# Patient Record
Sex: Male | Born: 1935 | Race: White | Hispanic: No | Marital: Married | Smoking: Former smoker
Health system: Southern US, Community
[De-identification: ages and names within clinical notes are randomized; demographics above are authoritative.]

## PROBLEM LIST (undated history)

## (undated) DIAGNOSIS — I4892 Unspecified atrial flutter: Secondary | ICD-10-CM

## (undated) DIAGNOSIS — E785 Hyperlipidemia, unspecified: Secondary | ICD-10-CM

## (undated) DIAGNOSIS — K219 Gastro-esophageal reflux disease without esophagitis: Secondary | ICD-10-CM

## (undated) DIAGNOSIS — E119 Type 2 diabetes mellitus without complications: Secondary | ICD-10-CM

## (undated) DIAGNOSIS — R635 Abnormal weight gain: Secondary | ICD-10-CM

## (undated) DIAGNOSIS — E039 Hypothyroidism, unspecified: Secondary | ICD-10-CM

## (undated) DIAGNOSIS — I219 Acute myocardial infarction, unspecified: Secondary | ICD-10-CM

## (undated) DIAGNOSIS — I10 Essential (primary) hypertension: Secondary | ICD-10-CM

## (undated) DIAGNOSIS — J449 Chronic obstructive pulmonary disease, unspecified: Secondary | ICD-10-CM

## (undated) DIAGNOSIS — I509 Heart failure, unspecified: Secondary | ICD-10-CM

## (undated) DIAGNOSIS — R0609 Other forms of dyspnea: Secondary | ICD-10-CM

## (undated) DIAGNOSIS — R609 Edema, unspecified: Secondary | ICD-10-CM

## (undated) DIAGNOSIS — I2510 Atherosclerotic heart disease of native coronary artery without angina pectoris: Secondary | ICD-10-CM

## (undated) DIAGNOSIS — Z95810 Presence of automatic (implantable) cardiac defibrillator: Secondary | ICD-10-CM

## (undated) DIAGNOSIS — M109 Gout, unspecified: Secondary | ICD-10-CM

## (undated) DIAGNOSIS — I6529 Occlusion and stenosis of unspecified carotid artery: Secondary | ICD-10-CM

## (undated) DIAGNOSIS — R0989 Other specified symptoms and signs involving the circulatory and respiratory systems: Secondary | ICD-10-CM

## (undated) DIAGNOSIS — F0390 Unspecified dementia without behavioral disturbance: Secondary | ICD-10-CM

## (undated) HISTORY — DX: Hyperlipidemia, unspecified: E78.5

## (undated) HISTORY — DX: Essential (primary) hypertension: I10

## (undated) HISTORY — DX: Other specified symptoms and signs involving the circulatory and respiratory systems: R06.09

## (undated) HISTORY — DX: Presence of automatic (implantable) cardiac defibrillator: Z95.810

## (undated) HISTORY — DX: Atherosclerotic heart disease of native coronary artery without angina pectoris: I25.10

## (undated) HISTORY — PX: CARDIAC DEFIBRILLATOR PLACEMENT: SHX171

## (undated) HISTORY — DX: Heart failure, unspecified: I50.9

## (undated) HISTORY — DX: Chronic obstructive pulmonary disease, unspecified: J44.9

## (undated) HISTORY — PX: ANGIOPLASTY: SHX39

## (undated) HISTORY — DX: Acute myocardial infarction, unspecified: I21.9

## (undated) HISTORY — DX: Unspecified atrial flutter: I48.92

## (undated) HISTORY — DX: Other specified symptoms and signs involving the circulatory and respiratory systems: R09.89

## (undated) HISTORY — DX: Type 2 diabetes mellitus without complications: E11.9

## (undated) HISTORY — DX: Occlusion and stenosis of unspecified carotid artery: I65.29

## (undated) HISTORY — DX: Gout, unspecified: M10.9

## (undated) HISTORY — DX: Abnormal weight gain: R63.5

## (undated) HISTORY — DX: Edema, unspecified: R60.9

## (undated) HISTORY — PX: CAROTID STENT: SHX1301

## (undated) HISTORY — DX: Hypothyroidism, unspecified: E03.9

---
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
## 1983-06-02 HISTORY — PX: EYE SURGERY: SHX253

## 1997-10-14 ENCOUNTER — Inpatient Hospital Stay (HOSPITAL_COMMUNITY): Admission: EM | Admit: 1997-10-14 | Discharge: 1997-10-17 | Payer: Self-pay | Admitting: Cardiology

## 1998-09-23 ENCOUNTER — Encounter: Payer: Self-pay | Admitting: Thoracic Surgery

## 1998-09-24 ENCOUNTER — Inpatient Hospital Stay (HOSPITAL_COMMUNITY): Admission: RE | Admit: 1998-09-24 | Discharge: 1998-09-26 | Payer: Self-pay | Admitting: Thoracic Surgery

## 1999-05-22 ENCOUNTER — Ambulatory Visit (HOSPITAL_COMMUNITY): Admission: RE | Admit: 1999-05-22 | Discharge: 1999-05-23 | Payer: Self-pay | Admitting: Cardiology

## 1999-05-23 ENCOUNTER — Encounter: Payer: Self-pay | Admitting: Cardiology

## 2000-07-07 ENCOUNTER — Encounter: Payer: Self-pay | Admitting: Internal Medicine

## 2000-07-07 ENCOUNTER — Ambulatory Visit (HOSPITAL_COMMUNITY): Admission: RE | Admit: 2000-07-07 | Discharge: 2000-07-08 | Payer: Self-pay | Admitting: Internal Medicine

## 2000-07-08 ENCOUNTER — Encounter: Payer: Self-pay | Admitting: Internal Medicine

## 2004-03-10 ENCOUNTER — Emergency Department (HOSPITAL_COMMUNITY): Admission: EM | Admit: 2004-03-10 | Discharge: 2004-03-10 | Payer: Self-pay | Admitting: Emergency Medicine

## 2004-05-05 ENCOUNTER — Ambulatory Visit: Payer: Self-pay | Admitting: Internal Medicine

## 2004-05-05 ENCOUNTER — Ambulatory Visit: Payer: Self-pay | Admitting: Cardiology

## 2004-05-14 ENCOUNTER — Inpatient Hospital Stay (HOSPITAL_COMMUNITY): Admission: EM | Admit: 2004-05-14 | Discharge: 2004-05-17 | Payer: Self-pay | Admitting: Emergency Medicine

## 2004-06-10 ENCOUNTER — Ambulatory Visit: Payer: Self-pay

## 2004-11-04 ENCOUNTER — Ambulatory Visit: Payer: Self-pay | Admitting: Cardiology

## 2005-02-10 ENCOUNTER — Ambulatory Visit: Payer: Self-pay | Admitting: Internal Medicine

## 2005-02-19 ENCOUNTER — Ambulatory Visit: Payer: Self-pay | Admitting: Cardiology

## 2005-06-05 ENCOUNTER — Ambulatory Visit: Payer: Self-pay | Admitting: Cardiology

## 2005-06-10 ENCOUNTER — Ambulatory Visit (HOSPITAL_COMMUNITY): Admission: RE | Admit: 2005-06-10 | Discharge: 2005-06-10 | Payer: Self-pay | Admitting: Surgery

## 2005-07-20 ENCOUNTER — Ambulatory Visit (HOSPITAL_COMMUNITY): Admission: RE | Admit: 2005-07-20 | Discharge: 2005-07-21 | Payer: Self-pay | Admitting: Surgery

## 2005-07-20 ENCOUNTER — Encounter (INDEPENDENT_AMBULATORY_CARE_PROVIDER_SITE_OTHER): Payer: Self-pay | Admitting: *Deleted

## 2005-09-03 ENCOUNTER — Ambulatory Visit: Payer: Self-pay | Admitting: Internal Medicine

## 2005-12-31 ENCOUNTER — Ambulatory Visit: Payer: Self-pay | Admitting: Internal Medicine

## 2006-02-17 ENCOUNTER — Ambulatory Visit: Payer: Self-pay | Admitting: Internal Medicine

## 2006-02-26 ENCOUNTER — Ambulatory Visit: Payer: Self-pay | Admitting: Internal Medicine

## 2006-03-05 ENCOUNTER — Ambulatory Visit: Payer: Self-pay | Admitting: Internal Medicine

## 2006-03-05 ENCOUNTER — Observation Stay (HOSPITAL_COMMUNITY): Admission: AD | Admit: 2006-03-05 | Discharge: 2006-03-06 | Payer: Self-pay | Admitting: Internal Medicine

## 2006-03-15 ENCOUNTER — Ambulatory Visit: Payer: Self-pay

## 2006-06-15 ENCOUNTER — Ambulatory Visit: Payer: Self-pay | Admitting: Internal Medicine

## 2006-09-16 ENCOUNTER — Ambulatory Visit: Payer: Self-pay | Admitting: Internal Medicine

## 2007-02-17 ENCOUNTER — Ambulatory Visit: Payer: Self-pay | Admitting: Internal Medicine

## 2007-05-19 ENCOUNTER — Ambulatory Visit: Payer: Self-pay | Admitting: Internal Medicine

## 2007-07-05 ENCOUNTER — Ambulatory Visit: Payer: Self-pay | Admitting: Internal Medicine

## 2007-09-29 ENCOUNTER — Ambulatory Visit: Payer: Self-pay | Admitting: Cardiology

## 2007-10-05 ENCOUNTER — Ambulatory Visit: Payer: Self-pay | Admitting: Cardiology

## 2007-10-05 ENCOUNTER — Encounter: Payer: Self-pay | Admitting: Cardiology

## 2007-10-05 ENCOUNTER — Ambulatory Visit: Payer: Self-pay

## 2007-10-05 LAB — CONVERTED CEMR LAB
Calcium: 9.4 mg/dL (ref 8.4–10.5)
Chloride: 106 meq/L (ref 96–112)
Creatinine, Ser: 1.5 mg/dL (ref 0.4–1.5)
GFR calc non Af Amer: 49 mL/min
Sodium: 142 meq/L (ref 135–145)

## 2007-10-06 ENCOUNTER — Ambulatory Visit: Payer: Self-pay | Admitting: Internal Medicine

## 2007-10-20 ENCOUNTER — Ambulatory Visit: Payer: Self-pay | Admitting: Cardiology

## 2007-10-27 ENCOUNTER — Encounter: Payer: Self-pay | Admitting: Internal Medicine

## 2007-10-27 ENCOUNTER — Ambulatory Visit (HOSPITAL_COMMUNITY): Admission: RE | Admit: 2007-10-27 | Discharge: 2007-10-27 | Payer: Self-pay | Admitting: Cardiology

## 2007-10-31 ENCOUNTER — Ambulatory Visit: Payer: Self-pay | Admitting: Cardiology

## 2007-11-17 ENCOUNTER — Ambulatory Visit: Payer: Self-pay | Admitting: Internal Medicine

## 2007-11-17 DIAGNOSIS — R0609 Other forms of dyspnea: Secondary | ICD-10-CM

## 2007-11-17 DIAGNOSIS — I5022 Chronic systolic (congestive) heart failure: Secondary | ICD-10-CM | POA: Insufficient documentation

## 2007-11-17 DIAGNOSIS — R0989 Other specified symptoms and signs involving the circulatory and respiratory systems: Secondary | ICD-10-CM | POA: Insufficient documentation

## 2007-11-17 DIAGNOSIS — I10 Essential (primary) hypertension: Secondary | ICD-10-CM

## 2007-11-17 DIAGNOSIS — I219 Acute myocardial infarction, unspecified: Secondary | ICD-10-CM | POA: Insufficient documentation

## 2007-11-17 HISTORY — DX: Essential (primary) hypertension: I10

## 2007-11-18 LAB — CONVERTED CEMR LAB
BUN: 26 mg/dL — ABNORMAL HIGH (ref 6–23)
Chloride: 101 meq/L (ref 96–112)
Eosinophils Relative: 3.5 % (ref 0.0–5.0)
Glucose, Bld: 111 mg/dL — ABNORMAL HIGH (ref 70–99)
HCT: 40.2 % (ref 39.0–52.0)
Monocytes Relative: 9.1 % (ref 3.0–12.0)
Neutrophils Relative %: 58.4 % (ref 43.0–77.0)
Platelets: 180 10*3/uL (ref 150–400)
Potassium: 5 meq/L (ref 3.5–5.1)
RBC: 4.08 M/uL — ABNORMAL LOW (ref 4.22–5.81)
WBC: 6 10*3/uL (ref 4.5–10.5)

## 2007-12-01 ENCOUNTER — Ambulatory Visit: Payer: Self-pay | Admitting: Internal Medicine

## 2007-12-01 DIAGNOSIS — J449 Chronic obstructive pulmonary disease, unspecified: Secondary | ICD-10-CM

## 2007-12-01 DIAGNOSIS — R609 Edema, unspecified: Secondary | ICD-10-CM | POA: Insufficient documentation

## 2007-12-01 DIAGNOSIS — J4489 Other specified chronic obstructive pulmonary disease: Secondary | ICD-10-CM

## 2007-12-01 HISTORY — DX: Chronic obstructive pulmonary disease, unspecified: J44.9

## 2007-12-01 HISTORY — DX: Other specified chronic obstructive pulmonary disease: J44.89

## 2007-12-22 ENCOUNTER — Ambulatory Visit (HOSPITAL_BASED_OUTPATIENT_CLINIC_OR_DEPARTMENT_OTHER): Admission: RE | Admit: 2007-12-22 | Discharge: 2007-12-22 | Payer: Self-pay | Admitting: Internal Medicine

## 2007-12-22 ENCOUNTER — Encounter: Payer: Self-pay | Admitting: Internal Medicine

## 2008-01-02 ENCOUNTER — Ambulatory Visit: Payer: Self-pay | Admitting: Pulmonary Disease

## 2008-01-05 ENCOUNTER — Ambulatory Visit: Payer: Self-pay | Admitting: Internal Medicine

## 2008-01-23 ENCOUNTER — Ambulatory Visit: Payer: Self-pay | Admitting: Internal Medicine

## 2008-01-23 DIAGNOSIS — R635 Abnormal weight gain: Secondary | ICD-10-CM | POA: Insufficient documentation

## 2008-01-31 ENCOUNTER — Ambulatory Visit: Payer: Self-pay | Admitting: Cardiology

## 2008-04-05 ENCOUNTER — Ambulatory Visit: Payer: Self-pay | Admitting: Internal Medicine

## 2008-07-31 ENCOUNTER — Ambulatory Visit: Payer: Self-pay | Admitting: Cardiology

## 2008-08-07 ENCOUNTER — Encounter: Payer: Self-pay | Admitting: Internal Medicine

## 2008-08-21 ENCOUNTER — Encounter: Payer: Self-pay | Admitting: Internal Medicine

## 2008-08-21 ENCOUNTER — Ambulatory Visit: Payer: Self-pay | Admitting: Internal Medicine

## 2008-09-28 ENCOUNTER — Ambulatory Visit: Payer: Self-pay | Admitting: Internal Medicine

## 2008-11-22 ENCOUNTER — Ambulatory Visit: Payer: Self-pay | Admitting: Internal Medicine

## 2009-01-16 ENCOUNTER — Encounter (INDEPENDENT_AMBULATORY_CARE_PROVIDER_SITE_OTHER): Payer: Self-pay | Admitting: *Deleted

## 2009-02-21 ENCOUNTER — Ambulatory Visit: Payer: Self-pay | Admitting: Internal Medicine

## 2009-04-29 ENCOUNTER — Ambulatory Visit: Payer: Self-pay | Admitting: Cardiology

## 2009-04-29 DIAGNOSIS — E039 Hypothyroidism, unspecified: Secondary | ICD-10-CM | POA: Insufficient documentation

## 2009-04-29 DIAGNOSIS — E785 Hyperlipidemia, unspecified: Secondary | ICD-10-CM | POA: Insufficient documentation

## 2009-05-17 ENCOUNTER — Ambulatory Visit (HOSPITAL_COMMUNITY): Admission: RE | Admit: 2009-05-17 | Discharge: 2009-05-17 | Payer: Self-pay | Admitting: Cardiology

## 2009-05-17 ENCOUNTER — Ambulatory Visit: Payer: Self-pay | Admitting: Cardiology

## 2009-05-17 ENCOUNTER — Ambulatory Visit: Payer: Self-pay

## 2009-05-17 ENCOUNTER — Encounter: Payer: Self-pay | Admitting: Cardiology

## 2009-05-23 ENCOUNTER — Encounter: Payer: Self-pay | Admitting: Internal Medicine

## 2009-05-23 ENCOUNTER — Ambulatory Visit: Payer: Self-pay | Admitting: Internal Medicine

## 2009-05-27 ENCOUNTER — Encounter: Payer: Self-pay | Admitting: Internal Medicine

## 2009-09-09 ENCOUNTER — Encounter: Payer: Self-pay | Admitting: Cardiology

## 2009-09-10 ENCOUNTER — Ambulatory Visit: Payer: Self-pay | Admitting: Cardiology

## 2009-10-24 ENCOUNTER — Encounter: Payer: Self-pay | Admitting: Cardiology

## 2009-12-05 ENCOUNTER — Ambulatory Visit: Payer: Self-pay | Admitting: Internal Medicine

## 2009-12-05 DIAGNOSIS — Z95810 Presence of automatic (implantable) cardiac defibrillator: Secondary | ICD-10-CM | POA: Insufficient documentation

## 2009-12-05 HISTORY — DX: Presence of automatic (implantable) cardiac defibrillator: Z95.810

## 2010-03-06 ENCOUNTER — Ambulatory Visit: Payer: Self-pay | Admitting: Internal Medicine

## 2010-03-07 ENCOUNTER — Encounter: Payer: Self-pay | Admitting: Internal Medicine

## 2010-04-16 ENCOUNTER — Ambulatory Visit: Payer: Self-pay | Admitting: Cardiology

## 2010-04-21 ENCOUNTER — Ambulatory Visit: Payer: Self-pay | Admitting: Cardiology

## 2010-05-04 LAB — CONVERTED CEMR LAB
BUN: 37 mg/dL — ABNORMAL HIGH (ref 6–23)
CO2: 33 meq/L — ABNORMAL HIGH (ref 19–32)
Chloride: 100 meq/L (ref 96–112)
Glucose, Bld: 239 mg/dL — ABNORMAL HIGH (ref 70–99)
Potassium: 4.5 meq/L (ref 3.5–5.1)

## 2010-06-12 ENCOUNTER — Ambulatory Visit
Admission: RE | Admit: 2010-06-12 | Discharge: 2010-06-12 | Payer: Self-pay | Source: Home / Self Care | Attending: Internal Medicine | Admitting: Internal Medicine

## 2010-06-13 ENCOUNTER — Encounter: Payer: Self-pay | Admitting: Internal Medicine

## 2010-07-01 NOTE — Assessment & Plan Note (Signed)
Summary: due 11/11  428.22  pfh,rn   Visit Type:  Follow-up Primary Provider:  Dr. [name]  CC:  Cardiomyopathy.  History of Present Illness: The patient presents for follow up of his cardiomyopathy. Since I last saw him he has had no new complaints but continues to have lower extremity swelling. It actually is worse than previous. He is wearing compression stockings. However, he saw his fever has his feet down most of the day. He is not having any new shortness of breath, PND or orthopnea. Is not having any new palpitations or presyncope. Is not having any chest discomfort, neck or arm discomfort. He has had no weight gain or edema.  Current Medications (verified): 1)  Spiriva Handihaler 18 Mcg  Caps (Tiotropium Bromide Monohydrate) .Marland Kitchen.. 1 Puff Once Daily 2)  Protonix 40 Mg  Tbec (Pantoprazole Sodium) .... Take  One 30-60 Min Before First Meal of The Day 3)  Furosemide 80 Mg Tabs (Furosemide) .Marland Kitchen.. 1 Two Times A Day 4)  Benicar 20 Mg  Tabs (Olmesartan Medoxomil) .... Once Daily 5)  Metoprolol Succinate 200 Mg  Tb24 (Metoprolol Succinate) .... Take 1/2 Tab By Mouth Two Times A Day 6)  Synthroid 175 Mcg Tabs (Levothyroxine Sodium) .Marland Kitchen.. 1 By Mouth Once Daily 7)  Aspirin Adult Low Strength 81 Mg  Tbec (Aspirin) .... Once Daily 8)  Lipitor 40 Mg  Tabs (Atorvastatin Calcium) .... Take 1 Tab By Mouth At Bedtime 9)  Cvs Allergy Relief 10 Mg  Tbdp (Loratadine) .... One Daily If Needed For Itchy Sneezy or Runny Nose 10)  Ventolin Hfa 108 (90 Base) Mcg/act  Aers (Albuterol Sulfate) .... 2 Puffs Every 4 Hr As Needed 11)  Multivitamins  Tabs (Multiple Vitamin) .... Take 1 Tablet By Mouth Once A Day 12)  Slow Fe 160 (50 Fe) Mg Cr-Tabs (Ferrous Sulfate Dried) .Marland Kitchen.. 1 Two Times A Day  Allergies (verified): No Known Drug Allergies  Past History:  Past Medical History: Reviewed history from 09/10/2009 and no changes required. Hypertension Myocardial Infarction 1999 (left main 30% stenosis, LAD 30%  stenosis     before first diagonal, 40 % stenosis after septal perforator, 40%     diagonal stenosis, circumflex 70% proximal stenosis, right coronary     artery large and dominant with 30% proximal stenosis, 80% narrowing     at the ostium of the PDA and 90% narrowing of a small branch of the     PDA.  The patient had tandem stenting to the right coronary     artery). CHF EF 30 - 35% COPD    -PFTs 10/27/2007 FEV1 56% ratio 62% with 10% improvement after bronchodilators DLC0 86% OSA   - AHI 9  RDI 23 with sat 86%  Past Surgical History: Reviewed history from 04/29/2009 and no changes required. Angioplasty/Stent 1999 Cholecystectomy [address] [employer] defibrillator  Review of Systems       As stated in the HPI and negative for all other systems.   Vital Signs:  Patient profile:   77 year old male Height:      69 inches Weight:      196 pounds BMI:     29.05 Pulse rate:   66 / minute Resp:     16 per minute BP sitting:   92 / 60  (right arm)  Vitals Entered By: Marrion Coy, CNA (April 16, 2010 8:42 AM)  Physical Exam  General:  Well developed, well nourished, in no acute distress. Head:  normocephalic and  atraumatic Eyes:  PERRLA/EOM intact; conjunctiva and lids normal. Mouth:  Edentulous.  Oral mucosa normal. Neck:  Neck supple, no JVD. No masses, thyromegaly or abnormal cervical nodes. Chest Wall:  Well-healed ICD pocket Lungs:  Clear bilaterally to auscultation with no wheezes, rales or rhonchi. Abdomen:  Bowel sounds positive; abdomen soft and non-tender without masses, organomegaly, or hernias noted. No hepatosplenomegaly. obese Msk:  Back normal, normal gait. Muscle strength and tone normal. Extremities:  moderate bilateral edema to below the knee Neurologic:  Alert and oriented x 3. Skin:  Intact without lesions or rashes. Cervical Nodes:  no significant adenopathy Inguinal Nodes:  no significant adenopathy Psych:  Normal affect.   Detailed  Cardiovascular Exam  Neck    Carotids: Carotids full and equal bilaterally without bruits.      Neck Veins: Normal, no JVD.    Heart    Inspection: no deformities or lifts noted.      Palpation: normal PMI with no thrills palpable.      Auscultation: regular rate and rhythm, S1, S2 without murmurs, rubs, gallops, or clicks.    Vascular    Abdominal Aorta: no palpable masses, pulsations, or audible bruits.      Femoral Pulses: normal femoral pulses bilaterally.      Pedal Pulses: normal pedal pulses bilaterally.      Radial Pulses: normal radial pulses bilaterally.      Peripheral Circulation: no clubbing, cyanosis, or edema noted with normal capillary refill.     EKG  Procedure date:  04/16/2010  Findings:      Sinus rhythm, rate 60 to, left bundle branch block, no acute ST-T wave changes   ICD Specifications Following MD:  Lewayne Bunting, MD     ICD Vendor:  [facility] [employer]     ICD Model Number:  T175     ICD Serial Number:  [id number] ICD DOI:  03/05/2006     ICD Implanting MD:  Lewayne Bunting, MD  Lead 1:    Location: RV     DOI: 07/07/2000     Model #: [id number]     Serial #: [id number]     Status: active Lead 2:    Location: RV     DOI: 03/05/2006     Model #: [id number]     Serial #: [id number]     Status: active  Indications::  CARDIOMYOPATHY, NSVT 4088 RV SENSING LEAD   ICD Follow Up ICD Dependent:  No      Brady Parameters Mode VVI     Lower Rate Limit:  40      Tachy Zones VF:  200     VT:  170     Impression & Recommendations:  Problem # 1:  EDEMA (ICD-782.3) At this point I would still like to try the conservative with his management. I reviewed his last creatinine and it was 1.5. I will have him take an extra 40 mg b.i.d. of Lasix for 2 days over the weekend when he can keep his feet elevated. He will come then early next week for a basic metabolic profile. Once his swelling is down he will be remeasured for a higher pressure below the knee compression stocking. He will otherwise  continue the meds as listed.  Problem # 2:  CHF CONGESTIVE HEART FAILURE (ICD-428.0) At this point he is not SOB.  He will otherwise continue the meds as listed.  Problem # 3:  Hx of MYOCARDIAL INFARCTION (ICD-410.90) He is having no angina.  He  will continue the meds as listed.  Problem # 4:  HYPERTENSION (ICD-401.9) His blood pressure is now low but tolerates the current regimine.  Patient Instructions: 1)  Your physician recommends that you schedule a follow-up appointment in:  2)  Your physician recommends that you return for lab work MONDAY 3)  Your physician has recommended you make the following change in your medication: TAKE FUROSEMIDE (LASIX) 120MG  TWICE A DAY FOR 2 DAYS. ALSO TAKE POTASSIUM DAILY FOR 2 DAYS WHILE ON INCREASED DOSE OF FUROSEMIDE. Prescriptions: KLOR-CON M20 20 MEQ CR-TABS (POTASSIUM CHLORIDE CRYS CR) TAKE 1 TABLET DAILY WHILE ON INCREASED DOSE OF FUROSEMIDE  #30 x 6   Entered by:   Lisabeth Devoid RN   Authorized by:   [name], MD, [facility]   Signed by:   Lisabeth Devoid RN on 04/16/2010   Method used:   Electronically to        [facility] [phone]* (retail)       [address] [place], [place]  [address]       Ph: [phone]       Fax: [phone]   RxID:   [id number]

## 2010-07-01 NOTE — Assessment & Plan Note (Signed)
Summary: per check out/sf   Visit Type:  Follow-up Primary Provider:  Dr. [name]  CC:  Cardiomyopathy.  History of Present Illness: The patient returns for followup. Since I last saw him he fell and severely sprained that ankle. At the last visit I did give him a few days of a higher dose of diuretic because of increased lower extremity swelling. He continues to have lower extremity edema and wears compression stockings. However, he says it is not as severe as previous. He currently has his right ankle covered in an Ace bandage. He denies any new shortness of breath, PND or orthopnea. No palpitations, presyncope or syncope. He denies chest pain. He says he is actually doing well and is working full-time and tolerating this.  Current Medications (verified): 1)  Spiriva Handihaler 18 Mcg  Caps (Tiotropium Bromide Monohydrate) .Marland Kitchen.. 1 Puff Once Daily 2)  Protonix 40 Mg  Tbec (Pantoprazole Sodium) .... Take  One 30-60 Min Before First Meal of The Day 3)  Furosemide 80 Mg Tabs (Furosemide) .Marland Kitchen.. 1 Two Times A Day 4)  Benicar 20 Mg  Tabs (Olmesartan Medoxomil) .... Once Daily 5)  Metoprolol Succinate 200 Mg  Tb24 (Metoprolol Succinate) .... Take 1/2 Tab By Mouth Two Times A Day 6)  Synthroid 175 Mcg Tabs (Levothyroxine Sodium) .Marland Kitchen.. 1 By Mouth Once Daily 7)  Aspirin Adult Low Strength 81 Mg  Tbec (Aspirin) .... Once Daily 8)  Lipitor 40 Mg  Tabs (Atorvastatin Calcium) .... Take 1 Tab By Mouth At Bedtime 9)  Cvs Allergy Relief 10 Mg  Tbdp (Loratadine) .... One Daily If Needed For Itchy Sneezy or Runny Nose 10)  Ventolin Hfa 108 (90 Base) Mcg/act  Aers (Albuterol Sulfate) .... 2 Puffs Every 4 Hr As Needed 11)  Multivitamins  Tabs (Multiple Vitamin) .... Take 1 Tablet By Mouth Once A Day 12)  Slow Fe 160 (50 Fe) Mg Cr-Tabs (Ferrous Sulfate Dried) .Marland Kitchen.. 1 Two Times A Day 13)  Cephalexin 500 Mg Caps (Cephalexin) .Marland Kitchen.. 1 By Mouth Qid  Allergies (verified): No Known Drug Allergies  Past  History:  Past Medical History: Hypertension Myocardial Infarction 1999 (left main 30% stenosis, LAD 30% stenosis     before first diagonal, 40 % stenosis after septal perforator, 40%     diagonal stenosis, circumflex 70% proximal stenosis, right coronary     artery large and dominant with 30% proximal stenosis, 80% narrowing     at the ostium of the PDA and 90% narrowing of a small branch of the     PDA.  The patient had tandem stenting to the right coronary     artery). CHF EF 30 - 35% COPD    -PFTs 10/27/2007 FEV1 56% ratio 62% with 10% improvement after bronchodilators DLC0 86% OSA   - AHI 9  RDI 23 with sat 86%  Past Surgical History: Reviewed history from 04/29/2009 and no changes required. Angioplasty/Stent 1999 Cholecystectomy [address] [employer] defibrillator  Review of Systems       As stated in the HPI and negative for all other systems.   Vital Signs:  Patient profile:   77 year old male Height:      69 inches Weight:      198 pounds BMI:     29.35 Pulse rate:   63 / minute Resp:     16 per minute BP sitting:   108 / 56  (right arm)  Vitals Entered By: Marrion Coy, CNA (September 10, 2009 2:02 PM)  Physical Exam  General:  Well developed, well nourished, in no acute distress. Head:  normocephalic and atraumatic Eyes:  PERRLA/EOM intact; conjunctiva and lids normal. Mouth:  Edentulous.  Oral mucosa normal. Neck:  Neck supple, no JVD. No masses, thyromegaly or abnormal cervical nodes. Chest Wall:  Well-healed ICD pocket Lungs:  Clear bilaterally to auscultation and percussion. Abdomen:  Bowel sounds positive; abdomen soft and non-tender without masses, organomegaly, or hernias noted. No hepatosplenomegaly. obese Msk:  Back normal, normal gait. Muscle strength and tone normal. Pulses:  normal pedal pulses bilaterally.   Extremities:  moderate bilateral ankle edema Neurologic:  Alert and oriented x 3. Psych:  Normal affect.   Detailed Cardiovascular  Exam  Neck    Carotids: Carotids full and equal bilaterally without bruits.      Neck Veins: Normal, no JVD.    Heart    Inspection: no deformities or lifts noted.      Palpation: normal PMI with no thrills palpable.      Auscultation: regular rate and rhythm, S1, S2 without murmurs, rubs, gallops, or clicks.    Vascular    Abdominal Aorta: no palpable masses, pulsations, or audible bruits.      Femoral Pulses: normal femoral pulses bilaterally.      Pedal Pulses: normal pedal pulses bilaterally.      Radial Pulses: normal radial pulses bilaterally.      Peripheral Circulation: no clubbing, cyanosis, or edema noted with normal capillary refill.     EKG  Procedure date:  09/10/2009  Findings:      sinus rhythm, left bundle branch block, no acute ST-T wave changes.   ICD Specifications ICD Vendor:  [employer]     ICD Model Number:  T175     ICD Serial Number:  [id number] ICD DOI:  03/05/2006     ICD Implanting MD:  Lewayne Bunting, MD  Lead 1:    Location: RV     DOI: 07/07/2000     Model #: [id number]     Serial #: [id number]     Status: active Lead 2:    Location: RV     DOI: 03/05/2006     Model #: [id number]     Serial #: [id number]     Status: active  Indications::  CARDIOMYOPATHY, NSVT 4088 RV SENSING LEAD   ICD Follow Up ICD Dependent:  No      Brady Parameters Mode VVI     Lower Rate Limit:  40      Tachy Zones VF:  200     VT:  170     Impression & Recommendations:  Problem # 1:  CHF CONGESTIVE HEART FAILURE (ICD-428.0) He is doing well. He does have some lower extremity edema but class I heart failure symptoms. At this point no change in his therapy is indicated. Orders: EKG w/ Interpretation (93000)  Problem # 2:  EDEMA (ICD-782.3) He will continue to wear compression stockings, keep his feet elevated and take the current diuretic. If he has increasing edema he let me know.  Problem # 3:  DYSLIPIDEMIA (ICD-272.4) This he is followed by his primary M.D. and I will defer to  his management.  Problem # 4:  WEIGHT GAIN, ABNORMAL (ICD-783.1) He has gained a few pounds and we discussed weight loss strategies.  Problem # 5:  Hx of MYOCARDIAL INFARCTION (ICD-410.90) He he is having no ongoing symptoms. No further testing is indicated. Orders: EKG w/ Interpretation (93000)  Patient Instructions: 1)  Your  physician recommends that you schedule a follow-up appointment in: NOVEMBER 2011 WITH DR [facility] 2)  Your physician recommends that you continue on your current medications as directed. Please refer to the Current Medication list given to you today. 3)  You have been diagnosed with Congestive Heart Failure or CHF.  CHF is a condition in which a problem with the structure or function of the heart impairs its ability to supply sufficient blood flow to meet the body's needs.  For further information please visit [email or web address].org for detailed information on CHF.

## 2010-07-01 NOTE — Cardiovascular Report (Signed)
Summary: Office Visit   Office Visit   Imported By: Roderic Ovens 12/10/2009 12:10:28  _____________________________________________________________________  External Attachment:    Type:   Image     Comment:   External Document

## 2010-07-01 NOTE — Cardiovascular Report (Signed)
Summary: Office Visit Remote   Office Visit Remote   Imported By: Roderic Ovens 03/13/2010 11:27:38  _____________________________________________________________________  External Attachment:    Type:   Image     Comment:   External Document

## 2010-07-01 NOTE — Assessment & Plan Note (Signed)
Summary: f1y   Visit Type:  Follow-up Referring Provider:  Dr. [name] Primary Provider:  Dr. [name]   History of Present Illness: [name] returns today for followup.  He is a pleasant 77 yo man with a h/o ICM, CHF, VT, s/p ICD.  The patient continues to work and denies c/p, sob but does have peripheral edema.  His only new complaint today is a fleeting feeling of numbness across the left side of his chest, neck and back of his head.  Current Medications (verified): 1)  Spiriva Handihaler 18 Mcg  Caps (Tiotropium Bromide Monohydrate) .Marland Kitchen.. 1 Puff Once Daily 2)  Protonix 40 Mg  Tbec (Pantoprazole Sodium) .... Take  One 30-60 Min Before First Meal of The Day 3)  Furosemide 80 Mg Tabs (Furosemide) .Marland Kitchen.. 1 Two Times A Day 4)  Benicar 20 Mg  Tabs (Olmesartan Medoxomil) .... Once Daily 5)  Metoprolol Succinate 200 Mg  Tb24 (Metoprolol Succinate) .... Take 1/2 Tab By Mouth Two Times A Day 6)  Synthroid 175 Mcg Tabs (Levothyroxine Sodium) .Marland Kitchen.. 1 By Mouth Once Daily 7)  Aspirin Adult Low Strength 81 Mg  Tbec (Aspirin) .... Once Daily 8)  Lipitor 40 Mg  Tabs (Atorvastatin Calcium) .... Take 1 Tab By Mouth At Bedtime 9)  Cvs Allergy Relief 10 Mg  Tbdp (Loratadine) .... One Daily If Needed For Itchy Sneezy or Runny Nose 10)  Ventolin Hfa 108 (90 Base) Mcg/act  Aers (Albuterol Sulfate) .... 2 Puffs Every 4 Hr As Needed 11)  Multivitamins  Tabs (Multiple Vitamin) .... Take 1 Tablet By Mouth Once A Day 12)  Slow Fe 160 (50 Fe) Mg Cr-Tabs (Ferrous Sulfate Dried) .Marland Kitchen.. 1 Two Times A Day  Allergies (verified): No Known Drug Allergies  Past History:  Past Medical History: Last updated: 09/10/2009 Hypertension Myocardial Infarction 1999 (left main 30% stenosis, LAD 30% stenosis     before first diagonal, 40 % stenosis after septal perforator, 40%     diagonal stenosis, circumflex 70% proximal stenosis, right coronary     artery large and dominant with 30% proximal stenosis, 80%  narrowing     at the ostium of the PDA and 90% narrowing of a small branch of the     PDA.  The patient had tandem stenting to the right coronary     artery). CHF EF 30 - 35% COPD    -PFTs 10/27/2007 FEV1 56% ratio 62% with 10% improvement after bronchodilators DLC0 86% OSA   - AHI 9  RDI 23 with sat 86%  Past Surgical History: Last updated: 04/29/2009 Angioplasty/Stent 1999 Cholecystectomy 2005 [employer] defibrillator  Review of Systems       The patient complains of peripheral edema.  The patient denies chest pain, syncope, and dyspnea on exertion.    Vital Signs:  Patient profile:   77 year old male Height:      69 inches Weight:      196 pounds BMI:     29.05 Pulse rate:   69 / minute BP sitting:   92 / 62  (right arm)  Vitals Entered By: Laurance Flatten CMA (December 05, 2009 9:31 AM)  Physical Exam  General:  Well developed, well nourished, in no acute distress. Head:  normocephalic and atraumatic Eyes:  PERRLA/EOM intact; conjunctiva and lids normal. Mouth:  Edentulous.  Oral mucosa normal. Neck:  Neck supple, no JVD. No masses, thyromegaly or abnormal cervical nodes. Chest Wall:  Well-healed ICD pocket Lungs:  Clear bilaterally  to auscultation with no wheezes, rales or rhonchi. Heart:  RRR with normal s1 and S2.  PMI is enlarged and laterally displaced. Abdomen:  Bowel sounds positive; abdomen soft and non-tender without masses, organomegaly, or hernias noted. No hepatosplenomegaly. obese Msk:  Back normal, normal gait. Muscle strength and tone normal. Pulses:  normal pedal pulses bilaterally.   Extremities:  moderate bilateral ankle edema Neurologic:  Alert and oriented x 3.    ICD Specifications Following MD:  Lewayne Bunting, MD     ICD Vendor:  [facility] [employer]     ICD Model Number:  T175     ICD Serial Number:  [id number] ICD DOI:  03/05/2006     ICD Implanting MD:  Lewayne Bunting, MD  Lead 1:    Location: RV     DOI: 07/07/2000     Model #: 0148     Serial #:  [id number]     Status: active Lead 2:    Location: RV     DOI: 03/05/2006     Model #: 5176     Serial #: [id number]     Status: active  Indications::  CARDIOMYOPATHY, NSVT 4088 RV SENSING LEAD   ICD Follow Up Battery Voltage:  2.89 V     Charge Time:  8.7 seconds     Underlying rhythm:  SR ICD Dependent:  No       ICD Device Measurements Right Ventricle:  Amplitude: 9.9 mV, Impedance: 531 ohms, Threshold: 1.2 V at 0.4 msec Shock Impedance: 54 ohms   Episodes MS Episodes:  0     Shock:  0     ATP:  0     Nonsustained:  0     Atrial Therapies:  0 Ventricular Pacing:  0%  Brady Parameters Mode VVI     Lower Rate Limit:  40      Tachy Zones VF:  200     VT:  170     Next Remote Date:  03/06/2010     Tech Comments:  NORMAL DEVICE FUNCTION.  NO EPISODES SINCE 2007.  NO CHANGES MADE.  LATITUDE CHECK 03-06-10. Vella Kohler  December 05, 2009 9:46 AM MD Comments:  Agree with above.  Normal device function.  Impression & Recommendations:  Problem # 1:  AUTOMATIC IMPLANTABLE CARDIAC DEFIBRILLATOR SITU (ICD-V45.02) His device is working normally.  Will recheck in several months.  Problem # 2:  CHF CONGESTIVE HEART FAILURE (ICD-428.0) His symptoms are well controlled.  Continue meds as below.  A low sodium diet is recommended. His updated medication list for this problem includes:    Furosemide 80 Mg Tabs (Furosemide) .Marland Kitchen... 1 two times a day    Benicar 20 Mg Tabs (Olmesartan medoxomil) ..... Once daily    Metoprolol Succinate 200 Mg Tb24 (Metoprolol succinate) .Marland Kitchen... Take 1/2 tab by mouth two times a day    Aspirin Adult Low Strength 81 Mg Tbec (Aspirin) ..... Once daily  Problem # 3:  WEIGHT GAIN, ABNORMAL (ICD-783.1) I have encouraged him on the importance of weight loss and increasing his exercise.  Patient Instructions: 1)  Your physician wants you to follow-up in:12 months with Dr Ladona Ridgel.   You will receive a reminder letter in the mail two months in advance. If you don't receive a letter,  please call our office to schedule the follow-up appointment.  Prevention & Chronic Care Immunizations   Influenza vaccine: Not documented    Tetanus booster: Not documented    Pneumococcal vaccine: Not  documented    H. zoster vaccine: Not documented  Colorectal Screening   Hemoccult: Not documented    Colonoscopy: Not documented  Other Screening   PSA: Not documented   Smoking status: quit  (12/01/2007)  Lipids   Total Cholesterol: Not documented   LDL: Not documented   LDL Direct: Not documented   HDL: Not documented   Triglycerides: Not documented    SGOT (AST): Not documented   SGPT (ALT): Not documented   Alkaline phosphatase: Not documented   Total bilirubin: Not documented  Hypertension   Last Blood Pressure: 92 / 62  (12/05/2009)   Serum creatinine: 1.8  (11/17/2007)   Serum potassium 5.0  (11/17/2007)  Self-Management Support :    Hypertension self-management support: Not documented    Lipid self-management support: Not documented

## 2010-07-01 NOTE — Miscellaneous (Signed)
  Clinical Lists Changes  Observations: Added new observation of ECHOINTERP:         - Left ventricle: The cavity size was normal. There was mild focal       basal hypertrophy of the septum. Systolic function was moderately       to severely reduced. The estimated ejection fraction was in the       range of 30% to 35%. There is akinesis and scarring of the entire       inferior myocardium. There is akinesis of the entire inferolateral       myocardium. Doppler parameters are consistent with abnormal left       ventricular relaxation (grade 1 diastolic dysfunction).     - Ventricular septum: Septal motion showed dyssynergy. These changes       are consistent with a left bundle branch block.     - Left atrium: The atrium was mildly dilated.     - Pulmonary arteries: Systolic pressure was mildly increased. PA       peak pressure: 35mm Hg (S). (05/17/2009 9:50)      Echocardiogram  Procedure date:  05/17/2009  Findings:              - Left ventricle: The cavity size was normal. There was mild focal       basal hypertrophy of the septum. Systolic function was moderately       to severely reduced. The estimated ejection fraction was in the       range of 30% to 35%. There is akinesis and scarring of the entire       inferior myocardium. There is akinesis of the entire inferolateral       myocardium. Doppler parameters are consistent with abnormal left       ventricular relaxation (grade 1 diastolic dysfunction).     - Ventricular septum: Septal motion showed dyssynergy. These changes       are consistent with a left bundle branch block.     - Left atrium: The atrium was mildly dilated.     - Pulmonary arteries: Systolic pressure was mildly increased. PA       peak pressure: 35mm Hg (S).

## 2010-07-09 NOTE — Cardiovascular Report (Signed)
Summary: Office Visit Remote   Office Visit Remote   Imported By: Roderic Ovens 06/30/2010 12:12:13  _____________________________________________________________________  External Attachment:    Type:   Image     Comment:   External Document

## 2010-07-24 ENCOUNTER — Encounter (INDEPENDENT_AMBULATORY_CARE_PROVIDER_SITE_OTHER): Payer: Medicare Other

## 2010-07-24 ENCOUNTER — Encounter (INDEPENDENT_AMBULATORY_CARE_PROVIDER_SITE_OTHER): Payer: Self-pay | Admitting: Pediatrics

## 2010-07-24 DIAGNOSIS — I428 Other cardiomyopathies: Secondary | ICD-10-CM

## 2010-08-07 ENCOUNTER — Encounter (INDEPENDENT_AMBULATORY_CARE_PROVIDER_SITE_OTHER): Payer: Medicare Other | Admitting: Internal Medicine

## 2010-08-07 ENCOUNTER — Encounter: Payer: Self-pay | Admitting: Internal Medicine

## 2010-08-07 DIAGNOSIS — I10 Essential (primary) hypertension: Secondary | ICD-10-CM

## 2010-08-07 DIAGNOSIS — I472 Ventricular tachycardia: Secondary | ICD-10-CM

## 2010-08-12 NOTE — Assessment & Plan Note (Signed)
Summary: reached eri per kristen/mt   Visit Type:  Follow-up Referring Provider:  Dr. [name] Primary Provider:  Dr. [name]   History of Present Illness: Mr. Callaham returns today for ICD followup. He has heard his device beeping.  He has a longstanding ICM, s/p MI, class 2 CHF and HTN. He denies c/p, sob, or peripheral edema. No intercurrent ICD therapies.  He denies weight gain.   Current Medications (verified): 1)  Spiriva Handihaler 18 Mcg  Caps (Tiotropium Bromide Monohydrate) .Marland Kitchen.. 1 Puff Once Daily 2)  Protonix 40 Mg  Tbec (Pantoprazole Sodium) .... Take  One 30-60 Min Before First Meal of The Day 3)  Furosemide 80 Mg Tabs (Furosemide) .Marland Kitchen.. 1 Two Times A Day 4)  Benicar 20 Mg  Tabs (Olmesartan Medoxomil) .... Once Daily 5)  Metoprolol Succinate 200 Mg  Tb24 (Metoprolol Succinate) .... Take 1/2 Tab By Mouth Two Times A Day 6)  Synthroid 175 Mcg Tabs (Levothyroxine Sodium) .Marland Kitchen.. 1 By Mouth Once Daily 7)  Aspirin Adult Low Strength 81 Mg  Tbec (Aspirin) .... Once Daily 8)  Lipitor 40 Mg  Tabs (Atorvastatin Calcium) .... Take 1 Tab By Mouth At Bedtime 9)  Cvs Allergy Relief 10 Mg  Tbdp (Loratadine) .... One Daily If Needed For Itchy Sneezy or Runny Nose 10)  Ventolin Hfa 108 (90 Base) Mcg/act  Aers (Albuterol Sulfate) .... 2 Puffs Every 4 Hr As Needed 11)  Multivitamins  Tabs (Multiple Vitamin) .... Take 1 Tablet By Mouth Once A Day 12)  Slow Fe 160 (50 Fe) Mg Cr-Tabs (Ferrous Sulfate Dried) .Marland Kitchen.. 1 Two Times A Day  Allergies (verified): No Known Drug Allergies  Past History:  Past Medical History: Last updated: 09/10/2009 Hypertension Myocardial Infarction 1999 (left main 30% stenosis, LAD 30% stenosis     before first diagonal, 40 % stenosis after septal perforator, 40%     diagonal stenosis, circumflex 70% proximal stenosis, right coronary     artery large and dominant with 30% proximal stenosis, 80% narrowing     at the ostium of the PDA and 90% narrowing of a  small branch of the     PDA.  The patient had tandem stenting to the right coronary     artery). CHF EF 30 - 35% COPD    -PFTs 10/27/2007 FEV1 56% ratio 62% with 10% improvement after bronchodilators DLC0 86% OSA   - AHI 9  RDI 23 with sat 86%  Past Surgical History: Last updated: 04/29/2009 Angioplasty/Stent 1999 Cholecystectomy 2005 [employer] defibrillator  Review of Systems  The patient denies chest pain, syncope, dyspnea on exertion, and peripheral edema.    Vital Signs:  Patient profile:   77 year old male Height:      69 inches Weight:      195 pounds BMI:     28.90 Pulse rate:   79 / minute BP sitting:   110 / 60  (left arm)  Vitals Entered By: Laurance Flatten CMA (August 07, 2010 3:33 PM)  Physical Exam  General:  Well developed, well nourished, in no acute distress. Head:  normocephalic and atraumatic Eyes:  PERRLA/EOM intact; conjunctiva and lids normal. Mouth:  Edentulous.  Oral mucosa normal. Neck:  Neck supple, no JVD. No masses, thyromegaly or abnormal cervical nodes. Chest Wall:  Well-healed ICD pocket Lungs:  Clear bilaterally to auscultation with no wheezes, rales or rhonchi. Heart:  RRR with normal s1 and S2.  PMI is enlarged and laterally displaced. Abdomen:  Bowel  sounds positive; abdomen soft and non-tender without masses, organomegaly, or hernias noted. No hepatosplenomegaly. obese Msk:  Back normal, normal gait. Muscle strength and tone normal. Pulses:  normal pedal pulses bilaterally.   Extremities:  moderate bilateral edema to below the knee Neurologic:  Alert and oriented x 3.    ICD Specifications Following MD:  Lewayne Bunting, MD     ICD Vendor:  [employer]     ICD Model Number:  T175     ICD Serial Number:  [id number] ICD DOI:  03/05/2006     ICD Implanting MD:  Lewayne Bunting, MD  Lead 1:    Location: RV     DOI: 07/07/2000     Model #: [id number]     Serial #: [id number]     Status: active Lead 2:    Location: RV     DOI: 03/05/2006     Model  #: [id number]     Serial #: [id number]     Status: active  Indications::  CARDIOMYOPATHY, NSVT 4088 RV SENSING LEAD   ICD Follow Up ICD Dependent:  No      Brady Parameters Mode VVI     Lower Rate Limit:  40      Tachy Zones VF:  200     VT:  170     MD Comments:  ICD has reached ERI, secondary to increased charge time. Will schedule generator change.  Impression & Recommendations:  Problem # 1:  AUTOMATIC IMPLANTABLE CARDIAC DEFIBRILLATOR SITU (ICD-V45.02) His device has reached ERI. Will schedule generator change.  Problem # 2:  CHF CONGESTIVE HEART FAILURE (ICD-428.0) His symptoms of systolic heart failure are class 2. Will follow, continue current meds and maintain a low sodium diet. His updated medication list for this problem includes:    Furosemide 80 Mg Tabs (Furosemide) .Marland Kitchen... 1 two times a day    Benicar 20 Mg Tabs (Olmesartan medoxomil) ..... Once daily    Metoprolol Succinate 200 Mg Tb24 (Metoprolol succinate) .Marland Kitchen... Take 1/2 tab by mouth two times a day    Aspirin Adult Low Strength 81 Mg Tbec (Aspirin) ..... Once daily

## 2010-08-12 NOTE — Cardiovascular Report (Signed)
Summary: Office Visit   Office Visit   Imported By: Roderic Ovens 08/08/2010 15:00:22  _____________________________________________________________________  External Attachment:    Type:   Image     Comment:   External Document

## 2010-08-18 ENCOUNTER — Encounter: Payer: Self-pay | Admitting: Internal Medicine

## 2010-08-19 NOTE — Cardiovascular Report (Signed)
Summary: Office Visit   Office Visit   Imported By: Roderic Ovens 08/13/2010 12:00:35  _____________________________________________________________________  External Attachment:    Type:   Image     Comment:   External Document

## 2010-08-22 ENCOUNTER — Other Ambulatory Visit (INDEPENDENT_AMBULATORY_CARE_PROVIDER_SITE_OTHER): Payer: Medicare Other | Admitting: *Deleted

## 2010-08-22 DIAGNOSIS — Z01810 Encounter for preprocedural cardiovascular examination: Secondary | ICD-10-CM

## 2010-08-22 DIAGNOSIS — R0789 Other chest pain: Secondary | ICD-10-CM

## 2010-08-22 DIAGNOSIS — I509 Heart failure, unspecified: Secondary | ICD-10-CM

## 2010-08-22 DIAGNOSIS — Z95810 Presence of automatic (implantable) cardiac defibrillator: Secondary | ICD-10-CM

## 2010-08-22 LAB — BRAIN NATRIURETIC PEPTIDE: Brain Natriuretic Peptide: 99 pg/mL (ref 0.0–100.0)

## 2010-08-22 LAB — APTT: aPTT: 36 seconds (ref 24–37)

## 2010-08-28 NOTE — Letter (Signed)
Summary: Implantable Device Instructions  Architectural technologist, Main Office  [address]   [place], [place] [address]   Phone: [phone]  Fax: [phone]      Implantable Device Instructions  You are scheduled for:  _____ Permanent Transvenous Pacemaker _____ Implantable Cardioverter Defibrillator _____ Implantable Loop Recorder ___X__ ICD Generator Change  on 08/29/2010 with Dr. Lewayne Bunting.  1.  Please arrive at the Short Stay Center at [facility] at 5:30am on the day of your procedure.  2.  Do not eat or drink the night before your procedure.  3.  Complete lab work on 08/22/2010 .  The lab at [facility] is open from 8:30 AM to 1:30 PM and from 2:30 PM to 5:00 PM.  The lab at [facility] is open from 7:30 AM to 5:30 PM.  You do not have to be fasting.  4.  Do NOT take these medications the Morning of procedure:  Furosemide.    5.  Plan for an overnight stay.  Bring your insurance cards and a list of your medications.  6.  Wash your chest and neck with antibacterial soap (any brand) the evening before and the morning of your procedure.  Rinse well.  7.  Education material received:     Pacemaker _____           ICD __X___           Arrhythmia _____  *If you have ANY questions after you get home, please call the office [phone].  *Every attempt is made to prevent procedures from being rescheduled.  Due to the nauture of Electrophysiology, rescheduling can happen.  The physician is always aware and directs the staff when this occurs.

## 2010-08-29 ENCOUNTER — Ambulatory Visit (HOSPITAL_COMMUNITY)
Admission: RE | Admit: 2010-08-29 | Discharge: 2010-08-29 | Disposition: A | Discharge status: Home / Self Care | Payer: Medicare Other | Source: Ambulatory Visit | Attending: Internal Medicine | Admitting: Internal Medicine

## 2010-08-29 ENCOUNTER — Ambulatory Visit (HOSPITAL_COMMUNITY): Payer: Medicare Other

## 2010-08-29 DIAGNOSIS — I509 Heart failure, unspecified: Secondary | ICD-10-CM | POA: Insufficient documentation

## 2010-08-29 DIAGNOSIS — Z4502 Encounter for adjustment and management of automatic implantable cardiac defibrillator: Secondary | ICD-10-CM | POA: Insufficient documentation

## 2010-08-29 DIAGNOSIS — I2589 Other forms of chronic ischemic heart disease: Secondary | ICD-10-CM | POA: Insufficient documentation

## 2010-08-29 LAB — BASIC METABOLIC PANEL
BUN: 56 mg/dL — ABNORMAL HIGH (ref 6–23)
GFR calc non Af Amer: 14 mL/min — ABNORMAL LOW (ref >60)
Potassium: 6.2 mEq/L — ABNORMAL HIGH (ref 3.5–5.1)

## 2010-08-29 LAB — CBC
Hemoglobin: 12.9 g/dL — ABNORMAL LOW (ref 13.0–17.0)
MCHC: 34.1 g/dL (ref 30.0–36.0)
RBC: 4 MIL/uL — ABNORMAL LOW (ref 4.22–5.81)

## 2010-08-29 LAB — GLUCOSE, CAPILLARY: Glucose-Capillary: 135 mg/dL — ABNORMAL HIGH (ref 70–99)

## 2010-08-29 LAB — PROTIME-INR
INR: 1.25 (ref 0.00–1.49)
Prothrombin Time: 15.9 seconds — ABNORMAL HIGH (ref 11.6–15.2)

## 2010-08-29 LAB — SURGICAL PCR SCREEN: Staphylococcus aureus: NEGATIVE

## 2010-09-05 NOTE — Op Note (Signed)
  NAMEDAWIT, TANKARD NO.:  [id number]  MEDICAL RECORD NO.:  [id number]           PATIENT TYPE:  O  LOCATION:  [facility]                         FACILITY:  [facility]  PHYSICIAN:  Doylene Canning. Ladona Ridgel, MD    DATE OF BIRTH:  08/31/1933  DATE OF PROCEDURE:  08/29/2010 DATE OF DISCHARGE:  08/29/2010                              OPERATIVE REPORT   ELECTROPHYSIOLOGIC PROCEDURE NOTE  PROCEDURE PERFORMED:  Removal of previously implanted single-chamber defibrillator and insertion of a new device with defibrillation threshold testing and implantable cardioverter-defibrillator pocket revision.  INTRODUCTION:  The patient is a 77 year old male with longstanding ischemic cardiomyopathy.  He has had class I congestive heart failure symptoms with an EF of 25% for many years.  He has a history of ventricular tachycardia.  He is now referred for removal of his old device which had reached elective replacement indication and insertion of the new device.  PROCEDURE:  After informed consent was obtained, the patient was taken to the diagnostic EP lab in fasting state.  After usual preparation and draping, intravenous fentanyl and midazolam were given for sedation.  A 30 mL of lidocaine was infiltrated into the left infraclavicular region below the ICD insertion site.  A 7-cm incision was carried out over this region.  Electrocautery was utilized to dissect down to the ICD pocket. The pocket was entered with electrocautery.  The fibrous adhesions were freed up, and the generator was removed without particular difficulty. At this point, the old [place] [employer] device was removed and the leads were evaluated and the new device which is an Gordon ICD, model E141, serial number [id number], was connected to the old rate sense lead and the defibrillator lead and placed back in the subcutaneous pocket.  The pocket was irrigated with antibiotic irrigation and electrocautery was utilized to assure  hemostasis.  The patient's R-waves were 12, the impedance was 500, the threshold was 1.1 volts at 0.5 milliseconds, and the shock impedance was 60 ohms.  After the patient was more deeply sedated with fentanyl and Versed, VF was induced with a T-wave shock.  A 17-joule shock was subsequently delivered which terminated VF and restored sinus rhythm.  No additional defibrillation threshold testing was carried out, and the incision was closed with 2-0 and 3-0 Vicryl. Benzoin and Steri-Strips were painted on the skin.  A pressure dressing was applied, and the patient was returned to his room in satisfactory condition.  COMPLICATIONS:  There were no immediate procedure complications.  RESULTS:  Demonstrate successful removal of a previously implanted [employer] single-chamber defibrillator and insertion of a new device with defibrillation threshold testing and ICD pocket revision to accommodate the different and larger shaped device.     Doylene Canning. Ladona Ridgel, MD     GWT/MEDQ  D:  08/29/2010  T:  08/29/2010  Job:  [id number]  Electronically Signed by Lewayne Bunting MD on 09/05/2010 06:48:15 AM

## 2010-09-09 LAB — GLUCOSE, CAPILLARY: Glucose-Capillary: 123 mg/dL — ABNORMAL HIGH (ref 70–99)

## 2010-09-17 ENCOUNTER — Ambulatory Visit (INDEPENDENT_AMBULATORY_CARE_PROVIDER_SITE_OTHER): Payer: Medicare Other | Admitting: *Deleted

## 2010-09-17 DIAGNOSIS — I509 Heart failure, unspecified: Secondary | ICD-10-CM

## 2010-09-17 DIAGNOSIS — Z95810 Presence of automatic (implantable) cardiac defibrillator: Secondary | ICD-10-CM

## 2010-09-17 DIAGNOSIS — I428 Other cardiomyopathies: Secondary | ICD-10-CM

## 2010-09-17 NOTE — Progress Notes (Signed)
Wound check ICD change out

## 2010-09-22 ENCOUNTER — Telehealth: Payer: Self-pay | Admitting: Internal Medicine

## 2010-09-22 NOTE — Telephone Encounter (Signed)
Pt thinks wire from icd is sticking up.  Pt would like to be seen.  Pt scheduled for 09-23-2010 @1400 .

## 2010-09-22 NOTE — Telephone Encounter (Signed)
Pt ha question re his device.

## 2010-09-23 ENCOUNTER — Encounter: Payer: Self-pay | Admitting: Internal Medicine

## 2010-09-23 ENCOUNTER — Ambulatory Visit (INDEPENDENT_AMBULATORY_CARE_PROVIDER_SITE_OTHER): Payer: Medicare Other | Admitting: Internal Medicine

## 2010-09-23 DIAGNOSIS — Z95810 Presence of automatic (implantable) cardiac defibrillator: Secondary | ICD-10-CM

## 2010-09-23 DIAGNOSIS — I509 Heart failure, unspecified: Secondary | ICD-10-CM

## 2010-09-23 DIAGNOSIS — I428 Other cardiomyopathies: Secondary | ICD-10-CM

## 2010-09-23 NOTE — Patient Instructions (Signed)
Your physician recommends that you schedule a follow-up appointment in: 2-3 weeks with device clinic when Dr Ladona Ridgel is in office

## 2010-09-23 NOTE — Assessment & Plan Note (Signed)
As the healing process has progressed, a portion of the defibrillator lead has healed in a curved position pressing up against the skin. It is currently stable and there is no evidence that there is impending perforation. I discussed the 2 treatment options one of which would be watchful waiting. The second would be to proceed with an operation. I would like to do is see him back in several weeks to see how he is healing.

## 2010-09-23 NOTE — Assessment & Plan Note (Signed)
He will continue his current medications and maintain a low-salt diet.

## 2010-09-23 NOTE — Progress Notes (Signed)
HPI [name] returns today for followup. He is a pleasant 77 year old male with ischemic cardiomyopathy congestive heart failure and ventricular tachycardia. He underwent ICD generator change several weeks ago. During the healing process, he noted that a portion of his lead was poking out against his skin. He has not had any fevers or chills and the lead has remained within the skin. No chest pain or shortness of breath. Allergies  Allergen Reactions  . Sulfa Antibiotics      Current Outpatient Prescriptions  Medication Sig Dispense Refill  . ACCU-CHEK AVIVA PLUS test strip       . atorvastatin (LIPITOR) 40 MG tablet Take 1 tablet by mouth Daily.      . furosemide (LASIX) 40 MG tablet Take 1 tablet by mouth Daily.      Marland Kitchen glimepiride (AMARYL) 2 MG tablet Take 1 tablet by mouth Daily.      Marland Kitchen levothyroxine (SYNTHROID, LEVOTHROID) 150 MCG tablet Take 1 tablet by mouth Daily.      . metoprolol (TOPROL-XL) 200 MG 24 hr tablet Take 1 tablet by mouth Daily.      Marland Kitchen olmesartan (BENICAR) 20 MG tablet Take 20 mg by mouth daily.        Marland Kitchen omeprazole (PRILOSEC) 20 MG capsule Take 1 tablet by mouth Daily.      Marland Kitchen QVAR 80 MCG/ACT inhaler Take 2 puffs by mouth at bedtime.         Past Medical History  Diagnosis Date  . Atrial flutter   . DM2 (diabetes mellitus, type 2)   . HYPERTENSION   . CHF CONGESTIVE HEART FAILURE   . Edema   . WEIGHT GAIN, ABNORMAL   . DYSPNEA   . AUTOMATIC IMPLANTABLE CARDIAC DEFIBRILLATOR SITU   . DYSLIPIDEMIA   . HYPOTHYROIDISM   . MYOCARDIAL INFARCTION     ROS:   All systems reviewed and negative except as noted in the HPI.   Past Surgical History  Procedure Date  . Cardiac defibrillator placement   . Angioplasty     stent  . Appendectomy 1957  . Eye surgery 1985  . Carotid stent      Family History  Problem Relation Age of Onset  . Other Mother     natural causes  . Kidney disease Father   . Cancer Brother     throat cancer     History    Social History  . Marital Status: Married    Spouse Name: N/A    Number of Children: N/A  . Years of Education: N/A   Occupational History  . Not on file.   Social History Main Topics  . Smoking status: Former Games developer  . Smokeless tobacco: Not on file   Comment: quit 1985  . Alcohol Use: No  . Drug Use: No  . Sexually Active: Not on file   Other Topics Concern  . Not on file   Social History Narrative  . No narrative on file     BP 107/60  Pulse 59  Ht 5\' 9"  (1.753 m)  Wt 198 lb (89.812 kg)  BMI 29.24 kg/m2  Physical Exam:  Well appearing NAD HEENT: Unremarkable Neck:  No JVD, no thyromegally Lymphatics:  No adenopathy Back:  No CVA tenderness Lungs:  Clear. The ICD site has a portion of the pacing lead pressing up against the skin HEART:  Regular rate rhythm, no murmurs, no rubs, no clicks Abd:  Flat, positive bowel sounds, no organomegally, no rebound, no guarding  Ext:  2 plus pulses, no edema, no cyanosis, no clubbing Skin:  No rashes no nodules Neuro:  CN II through XII intact, motor grossly intact  DEVICE  Normal device function.  See PaceArt for details.   Assess/Plan:

## 2010-10-14 NOTE — Assessment & Plan Note (Signed)
[facility]                            CARDIOLOGY OFFICE NOTE   NAME:Cloninger, BRANDELL MAREADY                      MRN:          [id number]  DATE:10/31/2007                            DOB:          13-Apr-1934    PRIMARY:  Dr. [name].   REASON FOR PRESENTATION:  Evaluate the patient with ischemic myopathy.   HISTORY OF PRESENT ILLNESS:  The patient presents for followup of the  above.  He has been followed in the heart failure clinic.  He was  recently sent for pulmonary function tests; however, these results not  available.  He gets dyspneic walking a moderate distance on level ground  but does not have any resting shortness of breath (class II).  He has no  PND or orthopnea.  He has not been having palpitation, presyncope or  syncope.  He has some chronic lower extremity swelling but has  compression stockings and thinks this has improved.  He has had a cough  that seems to be not particularly problematic at this point.  He denies  any chest discomfort, neck or arm discomfort.   PAST MEDICAL HISTORY:  1. Cardiomyopathy (ischemic; the EF is 20-25%).  2. Coronary artery disease (left main 30% stenosis, LAD 30% stenosis      before diagonal branch, 40% stenosis after septal perforator, 40%      diagonal stenosis, circumflex 70% proximal stenosis, and right      coronary artery large and dominant with 30% proximal stenosis, 80%      narrowing at the ostium of PDA and 90% narrowing at a small branch      of the PDA.  The patient had tandem stenting to the right coronary      artery).  3. Hypothyroidism (radiation therapy and radioactive iodine treatment      15 years ago).  4. Previous tobacco use.  5. Injury of his right leg as a child.  6. Dyslipidemia.  7. Status post Guidant mini-defibrillator.  8. Mild chronic renal insufficiency.   ALLERGIES:  None.   MEDICATIONS:  1. Claritin 10 mg daily.  2. Toprol 200 mg daily.  3. Furosemide 40 mg  daily.  4. Benicar 20 mg daily.  5. Lipitor 40 mg daily.  6. Aspirin 81 mg daily.  7. Synthroid 150 mcg daily.   REVIEW OF SYSTEMS:  As stated in the HPI and otherwise negative for  other systems.   PHYSICAL EXAMINATION:  The patient is in no distress.  Blood pressure 100/60, heart rate 56 and regular, weight 205 pounds.  HEENT:  Eyelids unremarkable; pupils equal, round, and reactive to  light; fundi not visualized.  NECK:  No jugular venous distention at 45 degrees.  Carotid upstroke  brisk and symmetrical.  No bruits, no thyromegaly.  LUNGS:  Clear to auscultation bilaterally.  CHEST:  Well-healed ICD pocket.  HEART:  PMI not displaced or sustained, S1-S2 within normal limits, no  S3, no murmurs.  ABDOMEN:  Obese, positive bowel sounds normal in frequency and pitch, no  bruits, rebound, guarding or midline pulsatile mass, no organomegaly.  SKIN:  No rashes, no nodules.  EXTREMITIES:  2+ pulses, moderate bilateral ankle edema.  NEURO:  Grossly intact.   ASSESSMENT/PLAN:  1. Cardiomyopathy.  The patient seems to have improved symptoms      recently.  His renal function is stable.  We cannot titrate his      medications, as his blood pressure will not allow.  For now, he is      going to continue on the current regimen with salt and fluid      restriction.  2. Obesity.  I discussed with him the need to lose weight with diet      and exercise and gave him some specific recommendations.  3. Renal insufficiency.  This is stable.  His creatinine in early May      was 1.5.  He will continue with the medications as listed.  4. Hypothyroidism.  He is having this followed by his primary care      doctor.  He will continue on the current dose of Synthroid.  5. Followup.  We will see him back in about 3 months.  However, we      will be following up with pulmonary function tests to see if there      could be a component of a primary lung problem causing his dyspnea.     [name], MD, [facility]  Electronically Signed    JH/MedQ  DD: 10/31/2007  DT: 10/31/2007  Job #: [id number]   cc:   [name]

## 2010-10-14 NOTE — Assessment & Plan Note (Signed)
[name]                         ELECTROPHYSIOLOGY OFFICE NOTE   NAME:Lardizabal, [name]                      MRN:          [id number]  DATE:07/05/2007                            DOB:          04/04/1934    [name] returns today for follow up.  He is a very pleasant male  with an ischemic cardiomyopathy, class I heart failure, history of  coronary disease status post MI, as well as dyslipidemia.  He returns  today for follow up.  In the interim, he has had no intercurrent ICD  therapies.  We last saw him in the office a year ago.  He denied chest  pain or shortness of breath today.  He denies any additional problems,  except he does have some chronic peripheral edema.   MEDICATIONS:  1. Claritin 10 mg a day.  2. Toprol-XL 200 a day.  3. Furosemide 40 a day.  4. Benicar 20 a day.  5. Lipitor 40 a day.  6. Aspirin 81 a day.  7. Synthroid 0.15 mg daily.   PHYSICAL EXAMINATION:  GENERAL:  He is a pleasant, well-appearing man in  no acute distress.  VITAL SIGNS:  Blood pressure today was 96/60, the pulse 63 and regular,  respirations were 18.  The weight was 203 pounds.  NECK:  No jugular vein distention.  LUNGS:  Clear bilaterally to auscultation.  No wheezes, rales or  rhonchi.  There is no increased work of breathing.  CARDIOVASCULAR:  Regular rate and rhythm with normal S1 and S2.  No  murmurs, rubs or gallops.  EXTREMITIES:  No edema.   Interrogation of his defibrillator demonstrates a Guidant Vitality.  The  R waves were 15, the impedance 600, the threshold 1.2 at 0.4.  Battery  voltage was 3.22 volts.  There are no intercurrent ICD therapies.   IMPRESSION:  1. Ischemic cardiomyopathy with severe left ventricular dysfunction.  2. History of ventricular tachycardia status post ICD insertion.   DISCUSSION:  Overall, [name] is stable and his defibrillator is  working normally.  Will plan to see the patient back in the office in  1  year.     [name]. Ladona Ridgel, MD  Electronically Signed    GWT/MedQ  DD: 07/05/2007  DT: 07/05/2007  Job #: [id number]   cc:   [name]

## 2010-10-14 NOTE — Procedures (Signed)
NAME:  [name], [name] NO.:  [id number]   MEDICAL RECORD NO.:  [id number]          PATIENT TYPE:  OUT   LOCATION:  SLEEP CENTER                 [facility]   PHYSICIAN:  Barbaraann Share, MD,FCCPDATE OF BIRTH:  09-15-1933   DATE OF STUDY:  12/22/2007                            NOCTURNAL POLYSOMNOGRAM   REFERRING PHYSICIAN:  Charlaine Dalton. Sherene Sires, MD, FCCP   LOCATION:  Sleep lab.   REFERRING PHYSICIAN:  Charlaine Dalton. Wert, MD, FCCP   INDICATION FOR STUDY:  Hypersomnia with sleep apnea.   EPWORTH SLEEPINESS SCORE:  11.   SLEEP ARCHITECTURE:  The patient had a total sleep time of 339 minutes  with no slow wave sleep and significantly decreased REM.  A lot of sleep  fragmentation was noted throughout the study.  Sleep onset latency was  normal as was REM latency.  Sleep efficiency was poor at 76%.   RESPIRATORY DATA:  The patient was found to have 24 obstructive apneas  and 24 hypopneas for an AHI of 9 events per hour.  He was also noted to  have 80 respiratory effort related arousals, resulting in an RDI of 23  events per hour.  The events were more common in the supine position,  and there was mild snoring noted throughout.  It should be noted the  patient had many more events with decreases in air flow that did not  meet strict desaturation criteria for a hypopnea.   OXYGEN DATA:  The patient had 02 desaturation as low as 86%.   CARDIAC DATA:  No clinically significant arrhythmias were noted.   MOVEMENT-PARASOMNIA:  The patient had no significant leg jerks or  abnormal behaviors.   IMPRESSIONS-RECOMMENDATIONS:  Mild to moderate obstructive sleep apnea  with an AHI of 9 events per hour, and an RDI of 23 events per hour.  There was O2 desaturation as low as 86%.  Treatment for this degree of  sleep apnea can include weight loss alone if applicable, upper airway  surgery, oral appliance, and also CPAP.  Clinical correlation is  suggested.      Barbaraann Share, MD,FCCP  Diplomate, American Board of Sleep  Medicine  Electronically Signed     [facility]/MEDQ  D:  01/04/2008 08:49:56  T:  01/04/2008 16:10:96  Job:  [id number]

## 2010-10-14 NOTE — Assessment & Plan Note (Signed)
[facility]                          CHRONIC HEART FAILURE NOTE   NAME:Tschirhart, MINOR IDEN                      MRN:          [id number]  DATE:10/20/2007                            DOB:          March 16, 1934    PRIMARY CARE PHYSICIAN:  Dr. [name].   PRIMARY CARDIOLOGIST:  Dr. [name].   Mr. Lankford is new to the heart failure clinic.  He is a very pleasant  77 year old Caucasian gentleman with a history of congestive heart  failure secondary to ischemic cardiomyopathy.  He saw Dr. Antoine Poche in  April of this year for increasing lower extremity swelling with  increased shortness of breath.  Mr. Bossier is here today for his  initial visit in the heart failure clinic.  He is accompanied by his  wife.  A very pleasant couple.  Mr. Naval continues to work full-  time.  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  His wife often accompanies him  on trips.  He states they eat out pretty much every meal because of  their traveling on the road, but they try to eat at  more high-end  restaurants.  He previously smoked tobacco greater than 25 years.  Has  never been diagnosed with any lung problems and has never seen a  pulmonologist, but tells me that he does wheeze at times.  His wife  states that he snores excessively and has never been worked up for this.  He does complain of increased fatigue, increased shortness of breath,  but states today they parked in the upper deck of our parking garage.  He walked down the steps, across the parking lot, and then up in the  elevator into our office and did not feel short of breath with that.  He  states that he does wheeze intermittently.  Does not associate it with  any time year or time of day.  He does have a cough that is  nonproductive.  Previously he had been on an ACE inhibitor and had a  cough with that.  He was switched to an ARB.  He still  has a cough but  he states it is not the same as the cough he had while he was on the ACE  inhibitor.  He denies any peripheral edema now.  Denies any orthopnea or  PND.  Denies any episodes of angina.  Has not had to use any  nitroglycerin in several years, he states.   PAST MEDICAL HISTORY:  1. Congestive heart failure secondary to ischemic cardiomyopathy with      EF previously 35-45%.  Mr. Haff just had an echocardiogram      repeated on May 6.  His EF is now 20-25%.  2. Coronary artery disease.  Previous PCI with stent.  3. Hypothyroidism.  Radiation therapy and radioactive iodine treatment      15 years ago.  4. Previous tobacco use greater than 25 years.  5. Dyslipidemia.  6. Status post implantation of a Guidant ICD.   REVIEW  OF SYSTEMS:  As stated above, otherwise negative.   CURRENT MEDICATIONS:  1. Claritin 10 mg daily.  2. Toprol XL 200 mg daily.  3. Furosemide 40 mg daily.  4. Benicar 20 mg daily.  5. Metoprolol 40 mg daily.  6. Aspirin 81 mg daily.  7. Synthroid 150 mcg daily.   P.R.N MEDICATIONS:  Include nitroglycerin, Protonix, and a Combivent  inhaler.   PHYSICAL EXAM:  Weight 204 pounds, blood pressure 99/56 with a heart  rate of 64.  Mr. Littlefield is in no acute distress.  No signs of jugular vein distention at 45 degrees angle.  LUNGS:  He has no wheezing heard at this time.  Clear to auscultation.  CARDIOVASCULAR:  Reveals S1-S2 regular rate and rhythm.  ABDOMEN:  Soft, nontender, positive bowel sounds obese.  LOWER EXTREMITIES:  Without clubbing or cyanosis.  He has a trace  nonpitting edema.  Note, the patient wears his support stockings on a  daily basis but did not wear them today because of his doctor's  appointment.  NEUROLOGICALLY:  He is alert and oriented x3   IMPRESSION:  Congestive heart failure secondary to ischemic  cardiomyopathy.  The patient without significant volume overload at this  time.  Encouraged the patient to continue  wearing his support stockings.  I have given him and his wife an educational packet.  We have talked  about his activity, fluid restriction, diet restrictions, medications,  and reason for when to call for problems.  I have also given him my cell  phone number if they need to get in touch with me for any reason.  We  are going to continue his medications at current doses.  His blood  pressure will not support any further titration.  I am concerned about  the patient's increased dyspnea.  I am going to send him for pulmonary  function study to obtain a baseline evaluation and we will go from  there.  The patient had recent interrogation of his device done on May  7.  Interpreted as normal with no ventricular episodes noted.  Depending  on results of PFT, could consider of right heart cath determine whether  the patient has underlying pulmonary hypertension and then adjust  medications as needed.      Dorian Pod, ACNP  Electronically Signed      [name], MD, [facility]  Electronically Signed   MB/MedQ  DD: 10/20/2007  DT: 10/20/2007  Job #: [id number]   cc:   [name]

## 2010-10-14 NOTE — Assessment & Plan Note (Signed)
[facility]                         ELECTROPHYSIOLOGY OFFICE NOTE   NAME:Ramakrishnan, NASEEM VARDEN                      MRN:          [id number]  DATE:08/21/2008                            DOB:          07/09/1933    Mr. Cawley returns today for followup.  He is a very pleasant middle-  aged patient of Dr. Samuel Germany with a longstanding cardiomyopathy and  nonsustained VT, class I to II congestive heart failure, who returns  today for followup.  The patient denies chest pain.  He denies shortness  of breath.  He has been quite stable over the last year.   MEDICATIONS:  1. Benicar 20 a day.  2. Aspirin 81 a day.  3. Pantoprazole 40 a day.  4. Metoprolol 200 half tablet twice daily.  5. Lipitor 40 a day.  6. Furosemide 80 twice daily.  7. Synthroid 175 mcg daily.  8. Iron supplements.   PHYSICAL EXAMINATION:  GENERAL:  He is a pleasant, well-appearing 77-  year-old man in no distress.  VITAL SIGNS:  Blood pressure was 106/54, the pulse 63 and regular,  respirations were 18, and weight was 203 pounds.  NECK:  No jugular vein distention.  LUNGS:  Clear bilaterally to auscultation.  No wheezes, rales, or  rhonchi are present.  No increased work of breathing.  CARDIOVASCULAR:  Regular rate and rhythm with normal S1 and S2.  ABDOMEN:  Soft and nontender.  EXTREMITIES:  Trace 1+ peripheral edema bilaterally.  He is wearing  support stockings.   Interrogation of his defibrillator demonstrates a Guidant vitality.  The  R-wave was 15, the impedance was 596, a threshold is a volt at 0.4.  Battery voltage was 3.17 volts.  There were no intercurrent IC  therapies.   IMPRESSION:  1. Longstanding cardiomyopathy.  2. Nonsustained ventricular tachycardia.  3. Congestive heart failure class II.   DISCUSSION:  Overall, Mr. Merlo is stable.  His defibrillator is  working normally.  His heart failure symptoms are well controlled.  I  will plan to see the patient back  in the office in 1 year or sooner  should any worsening heart failure.     Doylene Canning. Ladona Ridgel, MD  Electronically Signed    GWT/MedQ  DD: 08/21/2008  DT: 08/22/2008  Job #: [id number]   cc:   [name]

## 2010-10-14 NOTE — Assessment & Plan Note (Signed)
[facility]                            CARDIOLOGY OFFICE NOTE   NAME:[name], [name]                      MRN:          [id number]  DATE:09/29/2007                            DOB:          1933/11/29    PRIMARY CARE PHYSICIAN:  [name], MD   REASON FOR PRESENTATION:  Evaluate patient with ischemic cardiomyopathy.   HISTORY OF PRESENT ILLNESS:  Patient is now 77 years old.  It has been a  couple of years since he has done back to see me though he did see Dr.  Ladona Ridgel recently in the EP clinic.   He has a history of cardiomyopathy, but had been doing relatively well.  However, over the last few months he has had increasing lower extremity  swelling.  He has had slightly increased weight.  He said increased  dyspnea with mild exertion.  Says he gets short of breath walking a  short distance on level ground.  He did see Dr. Samuel Germany and had a stress  perfusion study ordered at [facility].  This demonstrated a significant  inferior wall scar with no ischemia.  The EF was calculated at 38%.  The  patient has had no med changes.  He has denied any chest pressure, neck  discomfort, arm discomfort, activity induced nausea, vomiting, or  excessive diaphoresis.  He is not describing any PND or orthopnea.  Had  no palpitations, pre-syncope or syncope.  He denies any change in his  diet and in particular he has not had more salt intake.  He denies any  change to his fluid intake.  He does have his feet down quite a bit as  he drives a lot for his work.   PAST MEDICAL HISTORY:  1. Ischemic cardiomyopathy (ejection fraction had been about 30% at      one point, but improved to 35-45% by echo in 2006).  2. Coronary artery disease (left main 30% stenosis, left anterior      descending artery 30% stenosis, before diagonal branch 40%      stenosis, after septal perforator, 40% diagonal stenosis,      circumflex 70% proximal stenosis and right coronary artery large  and dominant with 30% proximal stenosis, 80% narrowing at the      ostium of the posterior descending artery and 90% narrowing a small      branch of the posterior descending artery.  The patient had tandem      stents to the right coronary artery.  3. Hypothyroidism, radiation therapy and radioactive iodine treatment      15 years ago.  4. Previous tobacco abuse.  5. Injury to his right leg as a child.  6. Dyslipidemia.  7. Status post Guidant mini IV defibrillator.   ALLERGIES:  None.   MEDICATIONS:  1. Claritin 10 mg daily.  2. Toprol XL 200 mg daily.  3. Furosemide 40 mg daily.  4. Benicar 20 mg daily.  5. Lipitor 40 mg daily.  6. Aspirin 81 mg daily.  7. Synthroid 150 mcg daily.   REVIEW OF SYSTEMS:  As stated in HPI;  otherwise negative for other  systems.   PHYSICAL EXAMINATION:  The patient is in no distress.  Blood pressure  101/61, heart rate 51 and regular, weight 206 pounds, body mass index  30.  HEENT: Eyelids unremarkable. Pupils equal, round, and reactive to light.  Fundi not visualized. Oral mucosa is unremarkable.  NECK: No jugular venous distention at 45 degrees.  Carotid upstroke  brisk and symmetrical. No bruits, no thyromegaly.  LYMPHATICS: No cervical, axillary or inguinal adenopathy.  LUNGS: Clear to auscultation bilaterally.  BACK: No costovertebral angle tenderness.  CHEST: Well-healed sternotomy scar.  HEART: PMI not displaced or sustained. S1, S2 within normal limits. No  S3. No S4. No clicks, rub or murmurs.  ABDOMEN: Obese, positive bowel sounds, normal in frequency and pitch. No  bruits. No rebounds. No guarding. No midline pulsatile mass. No  hepatomegaly, splenomegaly.  SKIN: No rashes, no nodules.  EXTREMITIES: 2+ upper pulses, absent dorsalis pedis and posterior  tibialis bilaterally. Absent bilateral femorals. Moderate bilateral  lower extremity edema to the knees.  NEURO: Oriented to person, place and time. Cranial nerves II-XII  grossly  intact. Motor grossly intact.   EKG sinus rhythm with left bundle branch block.   ASSESSMENT/PLAN:  1. Cardiomyopathy.  The patient has had worsening of his volume status      with clear lower extremity edema and dyspnea.  His BNP was only      slightly elevated at 142.  Complicating this situation is renal      insufficiency with creatinine of 1.8. With these labs in hand, I      have suggested that he take an extra 40 mg of Lasix for the next 3      days.  He will come back early next week and get a BMET to make      sure we have not to exacerbate his renal insufficiency.  He has      been instructed on how to keep his feet elevated.  We talked about      salt restriction.  He may need compression stockings.  When he      comes back next week I am going to check an echocardiogram to see      if there has been any change in his LV function compared with      previous echoes, though the Cardiolite would suggest otherwise.  I      in particular want to see if there is any evidence of pulmonary      hypertension, though the physical exam does not suggest this by his      jugular veins.  Ultimately, we might consider upgrade of his      defibrillator to bi-ventricular pacemaker/defibrillator.  2. Renal insufficiency as above.  Coronary disease. He has had no      further symptoms suggestive of angina.  I will make decisions about      further cardiovascular testing following the above.  He will need      continued aggressive secondary risk reduction.  3. Dyslipidemia. Per Dr. Samuel Germany.  The goal LDL less than 100, HDL      greater than 40.  4. Hypothyroidism.  He remains on thyroid replacement followed per Dr.      Samuel Germany.  5. Followup. I will see him back in about a month.     [name], MD, [facility]  Electronically Signed    JH/MedQ  DD: 09/29/2007  DT: 09/29/2007  Job #: [id number]  cc:   [name]

## 2010-10-14 NOTE — Assessment & Plan Note (Signed)
[facility]                            CARDIOLOGY OFFICE NOTE   NAME:[name], [name]                      MRN:          [id number]  DATE:07/31/2008                            DOB:          1933/07/03    PRIMARY CARE PHYSICIAN:  [name], MD   REASON FOR PRESENTATION:  Evaluate the patient with coronary artery  disease and ischemic cardiomyopathy.   HISTORY OF PRESENT ILLNESS:  The patient presents for followup of the  above.  He is 77 years old.  He has been doing well since I last saw  him.  He still has lower extremity swelling, but he is not getting any  new shortness of breath.  He is not having any PND or orthopnea.  He  denies any chest discomfort, neck or arm discomfort.  He has had no  palpitations, presyncope, or syncope.   PAST MEDICAL HISTORY:  1. Cardiomyopathy (EF 20-25%).  2. Coronary artery disease (left main 30% stenosis, LAD 30% stenosis      before first diagonal, 40% stenosis after septal perforator, 40%      diagonal stenosis, circumflex 70% proximal stenosis, right coronary      artery large and dominant with 30% proximal stenosis, 80% narrowing      at the ostium of the PDA and 90% narrowing of a small branch of the      PDA.  The patient had tandem stenting to the right coronary      artery).  3. Hypothyroidism (radiation therapy and radioactive iodine treatment      15 years ago).  4. Previous tobacco use.  5. Right leg injury as a child.  6. Dyslipidemia.  7. Status post Guidant defibrillator.  8. Myoclonic renal insufficiency.   ALLERGIES/INTOLERANCES:  None.   MEDICATIONS:  1. Benicar 20 mg daily.  2. Aspirin 81 mg daily.  3. Equate.  4. Pantoprazole 40 mg daily.  5. Metoprolol 100 mg b.i.d.  6. Lipitor 40 mg daily.  7. Centrum.  8. Ventolin.  9. Spiriva.  10.Furosemide 80 mg b.i.d.  11.Synthroid 175 mcg daily.  12.Slow iron.   REVIEW OF SYSTEMS:  As stated in the HPI and otherwise negative for  other  systems.   PHYSICAL EXAMINATION:  GENERAL:  The patient is in no distress.  VITAL SIGNS:  Blood pressure 118/64, heart rate 54 and regular, weight  203 pounds, and body mass index 31.  HEENT:  Eyelids unremarkable; pupils are equally round, and reactive to  light; fundi not visualized; oral mucosa unremarkable.  NECK:  No jugular venous distention at 45 degrees, carotid upstroke  brisk and symmetric, no bruits, no thyromegaly.  LUNGS:  Clear to auscultation bilaterally.  CHEST:  Well-healed ICD pocket.  HEART:  PMI not displaced or sustained; S1 and S2 within normal limits,  no S3, no S4; no clicks, no rubs, no murmurs.  ABDOMEN:  Obese; positive bowel sounds, normal in frequency and pitch;  no bruits, no rebound, no guarding, no midline pulsatile mass; no  organomegaly.  SKIN:  No rashes, no nodules.  EXTREMITIES:  Upper pulses  2+, moderate bilateral left greater than  right lower extremity edema, absent dorsalis pedis and posterior  tibialis bilaterally, 1+ popliteals bilaterally.  NEURO:  Grossly intact.   EKG, sinus bradycardia, rate 54, axis within normal limits, left bundle-  branch block, no change from previous EKGs.   ASSESSMENT AND PLAN:  1. Cardiomyopathy.  He seems to be breathing well and having class I      symptoms at this point.  He does have lower extremity swelling, but      I do not want to give him more diuretic for this as he does have      renal insufficiency as well.  Therefore, I have asked him to buy      some compression stockings and given him a brochure from an outlet      in [place] where he can get these.  These can be knee-highs.  He      needs to keep his feet up and watch his salt.  2. Coronary artery disease.  He is having no ongoing symptoms.  No      further cardiovascular testing is suggested.  He will continue with      risk reduction.  3. Implantable cardioverter-defibrillator.  He will follow with Dr.      Ladona Ridgel.  I am going to stagger  his appointment, so that I will see      him once yearly and Dr. Ladona Ridgel will see him once yearly.  4. Dyslipidemia.  I have given him written instructions to get a lipid      profile and liver enzymes.  I would be happy to review this.  The      goal should be an LDL less than 100 and HDL greater than 40.  5. Followup as above.  I will see him in 6 months after Dr. Lubertha Basque      visit in April.     [name], MD, [facility]  Electronically Signed    JH/MedQ  DD: 07/31/2008  DT: 08/01/2008  Job #: [id number]   cc:   [name]

## 2010-10-14 NOTE — Assessment & Plan Note (Signed)
[facility]                            CARDIOLOGY OFFICE NOTE   NAME:[name], [name]                      MRN:          [id number]  DATE:01/31/2008                            DOB:          Oct 16, 1933    PRIMARY CARE PHYSICIAN:  [name].   REASON FOR PRESENTATION:  Evaluate the patient with ischemic  cardiomyopathy.   HISTORY OF PRESENT ILLNESS:  The patient is 77 years old.  At the last  visit, he was having still some dyspnea, despite what I thought was  optimal volume.  Since that time, he has done much better.  He is having  much less dyspnea.  He is walking maybe 3/4 of a mile every day.  He  denies any PND or orthopnea.  He has not been having any palpitation,  presyncope, or syncope.  He has been having no chest pain.  He does have  some mild lower extremity swelling, which is unchanged.   PAST MEDICAL HISTORY:  Cardiomyopathy (ischemic, EF 20-25%), coronary  artery disease (see October 31, 2007 note for details), hypothyroidism  (radiation therapy and radioactive iodine treatment 15 years ago),  previous tobacco use, injury to his right leg as a child, dyslipidemia,  status post Guidant defibrillator (Vitality T175), mild chronic renal  insufficiency.   ALLERGIES:  None.   MEDICATIONS:  1. Furosemide 40 mg daily.  2. Benicar 20 mg daily.  3. Synthroid 150 mcg daily.  4. Aspirin 81 mg daily.  5. Equate.  6. Pantoprazole 40 mg daily.  7. Metoprolol 100 mg b.i.d.  8. Lipitor 40 mg daily.  9. Centrum.  10.Ventolin.  11.Spiriva.   REVIEW OF SYSTEMS:  As stated in the HPI and otherwise negative for all  other systems.   PHYSICAL EXAMINATION:  GENERAL:  The patient is in no distress.  VITAL SIGNS:  Blood pressure 92/60, heart rate 51 and regular, and  weight 201 pounds.  NECK:  No jugular venous distention at 45 degrees; carotid upstroke  brisk and symmetric; no bruits, no thyromegaly.  LUNGS:  Clear to auscultation bilaterally.  CHEST:  Unremarkable.  HEART:  PMI not displaced or sustained; S1 and S2 within normal limits;  no S3, no S4; no clicks, no rubs, no murmurs.  CHEST:  Well-healed ICD pocket.  ABDOMEN:  Obese; positive bowel sounds, normal in frequency and pitch;  no bruits, no rebound, no guarding; no midline pulsatile mass, no  organomegaly.  SKIN:  No rashes, no nodules.  EXTREMITIES:  2+ upper pulses; 1+ dorsalis pedis and posterior tibialis  bilaterally; moderate bilateral ankle edema.  NEURO:  Grossly intact.   EKG sinus bradycardia, rate 51, left bundle-branch block, no change from  previous.   ASSESSMENT AND PLAN:  1. Cardiomyopathy.  The patient's breathing is much improved.  He      seems to be euvolemic at this point with the exception of some mild      lower extremity edema.  He is going to continue his compression      stockings, low-salt diet, and keeping his feet elevated.  I would  not increase his diuretics because of his renal insufficiency.  He      will otherwise continue with the above medications as he is on good      doses of beta-blocker and angiotensin-receptor blocker.  2. Renal insufficiency.  He is due to have labs done in a few weeks by      Dr. Samuel Germany.  He has had stable mild renal insufficiency.  3. Hypothyroidism.  This is followed by his primary care doctor.  4. Coronary disease.  He is having no ongoing chest pain.  He should      continue with risk reduction.  5. Dyslipidemia.  I will defer to Dr. Samuel Germany.  I would suggest a goal      LDL should be less than 100 and an HDL greater than 40.  6. Followup.  I will see him back in 6 months or sooner if needed.  He      will continue to follow in our ICD Clinic.     [name], MD, [facility]  Electronically Signed    JH/MedQ  DD: 01/31/2008  DT: 01/31/2008  Job #: [id number]   cc:   [name]

## 2010-10-16 ENCOUNTER — Encounter (INDEPENDENT_AMBULATORY_CARE_PROVIDER_SITE_OTHER): Payer: Medicare Other | Admitting: *Deleted

## 2010-10-16 ENCOUNTER — Telehealth: Payer: Self-pay | Admitting: Internal Medicine

## 2010-10-16 ENCOUNTER — Encounter: Payer: Medicare Other | Admitting: *Deleted

## 2010-10-16 DIAGNOSIS — R0989 Other specified symptoms and signs involving the circulatory and respiratory systems: Secondary | ICD-10-CM

## 2010-10-16 NOTE — Telephone Encounter (Signed)
Pt's appt for his device check was cancelled but this was a special check because he has the recall on the wiring.  Dr. Ladona Ridgel wanted to make sure it was not getting any worse. Please call pt back at ext [phone].

## 2010-10-16 NOTE — Telephone Encounter (Signed)
Patient scheduled for today for device check.

## 2010-10-17 NOTE — Assessment & Plan Note (Signed)
[facility]                         ELECTROPHYSIOLOGY OFFICE NOTE   NAME:[name], [name]                      MRN:          [id number]  DATE:06/15/2006                            DOB:          Feb 06, 1934    Mr. Osborn returns today for followup.  He is a very pleasant male  with a history of ischemic cardiomyopathy and ventricular tachycardia  who is status post ICD insertion back in October.  At that time, he put  a new rate sensitive ventricular lead in as well as a new Guidant  Vitality II ICD.  He returns today for followup and overall has done  well.  He continues to be very active and works. He denies chest pain or  shortness of breath and he denies peripheral edema.   PHYSICAL EXAMINATION:  GENERAL:  He is a pleasant man  in no acute  distress.  VITAL SIGNS:  Blood pressure today 102/60.  Pulse 70 and regular.  Respirations 18.  Weight 200 pounds.  NECK:  Revealed no jugular venous distension.  LUNGS:  Clear bilaterally to auscultation.  CARDIOVASCULAR:  Regular rate and rhythm with normal S1 and S2.  No  murmurs, rubs or gallops.  EXTREMITIES:  Demonstrated trace to 1+ peripheral edema bilaterally.   Interrogation of the defibrillator demonstrates a Guidant Vitality T175  with R wave greater than 25 and impedance of 7 at 92 ohms and threshold  of 0.6 volts at 0.5 Msec.  Battery voltage is 3.24 volts.  There are no  intercurrent ICD therapies.  Today the outputs  were turned down to 2.4  at 0.5 in the ventricle.  He will be signed up for a [employer] monitoring  program and see me back in the office in one year.     Doylene Canning. Ladona Ridgel, MD  Electronically Signed    GWT/MedQ  DD: 06/15/2006  DT: 06/16/2006  Job #: [id number]   cc:   [name]

## 2010-10-17 NOTE — Assessment & Plan Note (Signed)
[name]                           ELECTROPHYSIOLOGY OFFICE NOTE   NAME:[name]                      MRN:          [id number]  DATE:02/17/2006                            DOB:          August 08, 1933    HISTORY OF PRESENT ILLNESS:  [name] returns today for followup.  He is  a very pleasant male with a history of ischemic cardiomyopathy and BT, who  is status post ICD implantation back in February of 2002.  He returns today  for followup.  He has had no inter-current osteotherapy.  He denies chest  pain or shortness of breath.  We have noted for several years now that his  defibrillator lead had an elevated pacer impedence and an elevated pacer  threshold, not present at the time of initial implant.  He has received no  spurious discharges and his numbers have all been fairly stable, despite  elevated threshold impedences.  His R waves have been stable.  He reached  electrical replacement indication back on 01/02/2006 and now returns for  followup.   PHYSICAL EXAMINATION:  GENERAL:  He is a pleasant, well-appearing, middle-  aged man in no distress.  VITAL SIGNS:  Blood pressure today was 102/60, pulse is 72 and regular,  respirations are 18 and weight is 203 pounds.  NECK:  Reveals no jugular venous distention.  LUNGS:  Clear bilaterally to auscultation.  CARDIOVASCULAR:  Regular rate and rhythm with a normal S1 and S2.  EXTREMITIES:  Demonstrated 1+ edema on the left and trace on the right.   FINDINGS:  Interrogation of his defibrillator demonstrates a Guidant mini-4  with R waves of 18, pacer impedence of 1600 ohms and threshold of 2.8 volts  0.4 milliseconds.  The battery voltage is 2.58 volts.  Charge  time is 23  seconds.   IMPRESSION:  1. Ischemic cardiomyopathy.  2. Ventricular tachycardia.  3. Status post ICD insertion, now ERI.   DISCUSSION:  I have discussed the treatment options with [name].  Will  plan on  proceeding with ICD generator change +/- lead revision in the next  several weeks.                                   Doylene Canning. Ladona Ridgel, MD   GWT/MedQ  DD:  02/17/2006  DT:  02/18/2006  Job #:  [id number]   cc:   [name]

## 2010-10-17 NOTE — Discharge Summary (Signed)
[place]  Patient:    [name], [name]                      MRN: [id number] Adm. Date:  [id number] Disc. Date: [id number] Attending:  Lewayne Bunting Dictator:   Chinita Pester, CRNP CC:         Guadalupe Maple, M.D., [address],  [employer], [place], [place] [address]  [name], M.D. [facility]  Doylene Canning. Ladona Ridgel, M.D. [facility]  Gunnar Fusi, [facility]   Discharge Summary  PRIMARY DIAGNOSIS:  Cardiomyopathy.  SECONDARY DIAGNOSES: 1. Hyperlipidemia. 2. Hypothyroidism.  HISTORY OF PRESENT ILLNESS:  This is a 77 year old gentleman, a patient of Guadalupe Maple, M.D., who was initially involved as part of a MADIT 2 study. The patient had ischemic cardiomyopathy, but really no significant congestive heart failure.  He was randomized to the medical arm of MADIT 2 and since his study was stopped earlier secondary to profound survival advantages in patient receiving ICD, he returns for consideration of ICD implantation.  The patient has had no syncope or near syncope.  He denies palpitations.  He has had minimal congestive heart failure.  He was admitted for ICD implantation for ischemic cardiomyopathy.  HOSPITAL COURSE:  The patient underwent the procedure and tolerated the procedure well.  He had a Guidant ICD inserted without any difficulty.  Post implantation follow-up his R waves were 17.1 mv.  His thresholds were 1.0 volts at 0.5 msec.  The chest x-ray showed proper placement of ICD wire. He was discharged to home on the following medications.  DISCHARGE MEDICATIONS: 1. Synthroid 150 mcg daily. 2. Lipitor 20 mg nightly. 3. Coated aspirin 325 mg daily. 4. Toprol XL 75 mg daily. 5. Altace 10 mg daily. 6. Lasix 20 mg daily. 7. Claritin as before.  ACTIVITY:  He was instructed not to have any heavy lifting or strenuous activity with his left arm for six to eight weeks.  Do not raise your left arm above your head for one week  and gradually raise as instructed.  DIET:  A low-fat, low-cholesterol, no added salt diet.  WOUND CARE:  He was to keep his incision clean and dry.  No shower for one week.  Call if he would developed a lump or any drainage.  FOLLOW-UP:  A follow-up appointment with Doylene Canning. Ladona Ridgel, M.D., for six to eight weeks.  The office with call with an appointment.  A physician assistant at [facility] will follow him for a wound check on July 13, 2000, at 12:30 p.m. DD:  07/08/00 TD:  07/09/00 Job: [id number]

## 2010-10-17 NOTE — Op Note (Signed)
[name], [name] NO.:  [id number]   MEDICAL RECORD NO.:  [id number]          PATIENT TYPE:  OIB   LOCATION:  4707                         FACILITY:  [facility]   PHYSICIAN:  Currie Paris, M.D.DATE OF BIRTH:  03/27/1934   DATE OF PROCEDURE:  07/20/2005  DATE OF DISCHARGE:                                 OPERATIVE REPORT   PREOPERATIVE DIAGNOSIS:  Chronic calculus cholecystitis.   POSTOPERATIVE DIAGNOSIS:  Chronic calculus cholecystitis.   OPERATION:  Laparoscopic cholecystectomy with operative cholangiogram.   SURGEON:  Currie Paris, M.D.   ASSISTANT:  Dr. Zachery Dakins   ANESTHESIA:  General endotracheal   CLINICAL HISTORY:  This is a 77 year old gentleman who has had gallstones  and biliary symptoms. He elected to proceed to cholecystectomy.   DESCRIPTION OF PROCEDURE:  The patient was seen in the holding area and he  had no further questions.  He confirmed that cholecystectomy was the planned  procedure. He has a defibrillator in and this was turned off prior to  starting.   The patient taken to the operating room.  After satisfactory general  endotracheal anesthesia had been obtained, the abdomen was prepped and  draped. The time-out occurred.   I used  0.25% plain Marcaine for each incision. Umbilical incision was made.  The fascia opened and the peritoneal cavity entered under direct vision. A  pursestring of 0 Vicryl was placed.  The Hasson introduced and the abdomen  insufflated to 15.   Camera was inserted.  There was no gross abnormalities noted. We had free  space in the abdomen.   The patient was placed in reverse Trendelenburg and tilted to the left.  Under direct vision, a 10/11 trocar was placed in the epigastrium and two 5  mm laterally. The gallbladder was retracted over the liver. The perineum  over the cystic duct was opened and the perineum on either side of the  gallbladder opened so we had a nice window and could see  easily a long  cystic duct and a long cystic artery.   A single clip was placed in the cystic duct at its junction with the  gallbladder and a single clip on the cystic artery.   A Cook catheter was introduced percutaneously. The cystic duct was opened  and there was no stone material in it that could be milked out.  The  catheter was inserted and held with a clip.  An operative angiography done.  This showed a normal anatomy with good filling of the duodenum and no  filling defects.   Cystic duct catheter was removed and three clips placed on the stay side of  the cystic duct and it was divided. Three additional clips were placed in  the cystic artery, again leaving three clips on the stay side, and it was  divided. The gallbladder was removed from below to above with coagulation  with cautery. Everything appeared to be dry. The gallbladder was brought out  the umbilical port. We reinsufflated and made sure everything was dry. The  umbilical port was closed with pursestring. Lateral ports removed under  direct vision.  There was no bleeding. The abdomen was deflated through the  epigastric port.   The skin was closed with 4-0 Monocryl subcuticular and Dermabond. The  patient tolerated the procedure well. There were no operative complications.  All counts were correct.      Currie Paris, M.D.  Electronically Signed     CJS/MEDQ  D:  07/20/2005  T:  07/20/2005  Job:  [id number]   cc:   [name]  Fax: [phone]   [name], M.D.  [address]  [address]  [place]  [place] [address]

## 2010-10-17 NOTE — Assessment & Plan Note (Signed)
[facility]                           ELECTROPHYSIOLOGY OFFICE NOTE   NAME:[name], [name]                      MRN:          [id number]  DATE:                                      DOB:          04-10-1934    The patient is for routine follow up on his mini-4 model #[id number] single  chamber defibrillator implanted in February of 2002 for cardiomyopathy and  nonsustained ventricular tachycardia.   On interrogation there are no episodes stored in the device. Battery voltage  is 2.6 volts with last recorded charge time of 19.8 seconds. In the  ventricle intrinsic amplitude is 17. 6 millivolts with an impedence of 1604  ohms and threshold of 2.4 volts of 0.4 milliseconds. No programming changes  were made.  [name] will be seen in 4 month's time for follow up by Dr.  Ladona Ridgel.    Dictated by Cleatrice Burke, Tilda Franco, RN                                Doylene Canning. Ladona Ridgel, MD   CF/MedQ  DD:  12/31/2005  DT:  12/31/2005  Job #:  [id number]

## 2010-10-17 NOTE — H&P (Signed)
NAMECAIDAN, Jonathon NO.:  [id number]   MEDICAL RECORD NO.:  [id number]          PATIENT TYPE:  INP   LOCATION:  6526                         FACILITY:  [facility]   PHYSICIAN:  Deirdre Peer. Polite, M.D. DATE OF BIRTH:  January 29, 1934   DATE OF ADMISSION:  05/13/2004  DATE OF DISCHARGE:                                HISTORY & PHYSICAL   CHIEF COMPLAINT:  Cough and congestion.   HISTORY OF PRESENT ILLNESS:  Mr. Feltus is a 77 year old gentleman with  known history of coronary artery disease, in fact multivessel disease status  post MI in 1999.  He also has LV dysfunction with an EF of 25-35%; also is  status post AICD placement in 2003, as well as a history of Grave's disease  status post I131 in 1970 and currently on Synthroid replacement.  He  presents to the ED with complaints of coughing and congestion.  The patient  states his symptoms started yesterday, had associated temperature as high as  103.  The patient states he has not been bringing up much sputum.  He does  admit to having sick contact at home (his wife).  The patient states his  wife is also having flu-like symptoms.  The patient states that he had a flu  shot approximately one week ago.  Again, prior to yesterday the patient  stated that he has been feeling pretty good, without any problems.  Up until  yesterday when he had cough and congestion in his chest, as well as fever,  malaise and muscle aches.  He denied any chest pain or palpitations.  Denied  any nausea, vomiting, diarrhea or constipation.  The patient stated he saw  an outpatient doctor at an urgent care, and was told that he had flu.  The  patient was treated with an unknown medication.  The patient's symptoms have  not abated since being seen at the outpatient clinic; therefore, the patient  has presented to the ED for evaluation.   In the ED the patient was evaluated.  He had a chest x-ray without  infiltrate; a CBC without leukocytosis.   The patient was slated for  discharge to home; however, the patient became hypotensive in the ED,  requiring fluid resuscitation. The patient's symptoms improved with IV  fluids.  Admission is deemed necessary for further evaluation and treatment.  At the time of my evaluation the patient was alert and oriented in no  apparent distress; states he still feels somewhat weak, but a whole lot  better.  As stated, admission is deemed necessary for further evaluation  and treatment for probable influenza with associated hypotension.   PAST MEDICAL HISTORY:  As stated above:  1.  Coronary artery disease, status post MI in 1999.  2.  Congestive heart failure with an EF of 25-30%.  3.  Status post AICD in 2003.  4.  History of hypertension.  5.  History of Graves' disease, status post I131 in 1970; currently on      Synthroid replacement.  6.  Hypothyroidism.   MEDICATIONS:  1.  Lipitor 20 mg q.d.  2.  Altace 10 mg q.d.  3.  Toprol XL 200 mg q.d.  4.  Ecotrin 325 mg q.d.  5.  Claritin p.r.n.  6.  Protonix p.r.n.   SOCIAL HISTORY:  Negative for tobacco x20 years, prior to that the patient  smoked two packs a day for approximately five years.  The patient denies any  alcohol or drugs.   PAST SURGICAL HISTORY:  1.  Appendectomy 1957.  2.  Denies any cholecystectomy.  3.  Left carotid endarterectomy in 2000.   FAMILY HISTORY:  Mother without any medical problems.  Father deceased  secondary to some undescribed kidney disease.  The patient has two brothers,  one had throat cancer (he was a smoker) and the other one is healthy.  The  patient has a sister that is healthy.   REVIEW OF SYSTEMS:  As stated in the HPI.   PHYSICAL EXAMINATION:  GENERAL:  The patient is alert and oriented x3.  No  apparent distress.  VITAL SIGNS:  Temperature max 101.2 on presentation, currently 98.7.  Blood  pressure (on presentation) 115/69 with a pulse of 123, respiratory rate 24.  The patient's BP was as  low as 60/36 standing, pulse 109, BP 89/45 sitting,  BP 99/51 lying.  HEENT:  The patient has slightly injected sclerae.  No oral lesions.  NECK:  No nodes.  No JVD.  CHEST:  Clear to auscultation without wheeze or rhonchi.  CARDIOVASCULAR:  Regular.  ABDOMEN:  Soft and nontender.  EXTREMITIES:  No clubbing, cyanosis or edema.  NEUROLOGIC:  Essentially nonfocal.   LABORATORY/RADIOLOGIC DATA:  Chest x-ray without infiltrate.  There is an  AICD.  Lactic acid level 1.5.  CBC:  White count 6.0, hemoglobin 15.3,  hematocrit 43.7, MCV 92.8, platelet count 146, neutrophil count 89%.  Urinalysis:  Specific gravity 1.025, nitrite negative, trace leukocyte  esterase, few bacteria.  Point of care enzymes:  Myoglobin 136, CK-MB less  than 1.0, Troponin-I less than 0.05.   ASSESSMENT:  1.  Fever, in a patient with a one-day history prodromal of cough,      congestion and myalgias.  Most likely consistent with influenza.  2.  Coronary artery disease status post MI.  3.  Congestive heart failure.  4.  Status post AICD.  5.  Hypotension with orthostasis, improved status post IV fluids.  6.  History of Graves' disease, status post I131 in 1970.  7.  High cholesterol.   RECOMMENDATIONS:  Patient to be admitted to telemetry floor bed.  The  patient will be given judicious IV fluids.  Will start him empirically on  antibiotics, as well as treatment for a possible influenza.  The patient  will be pan cultured (blood, sputum, urine, as well as nasopharyngeal swab  for influenza).  As the patient is orthostatic at this time, and probably  dehydrated secondary to high fever and not taking in much p.o. intake, will  continue to hold  the patient's BP medications at this time.  Will give IV fluids cautiously  as to prevent putting the patient into congestive heart failure.  Will obtain a TSH and check full set of  cardiac enzymes.  Will make further  recommendations after review of the above  studies.      Joen Laura   RDP/MEDQ  D:  05/14/2004  T:  05/14/2004  Job:  [id number]

## 2010-10-17 NOTE — Procedures (Signed)
[place]. [facility]  Patient:    [name], [name]                      MRN: [id number] Proc. Date: 07/07/00 Adm. Date:  [id number] Attending:  Lewayne Bunting CC:         [name], M.D. [facility]  Dr. [name]  Kathrine Cords, R.N., [facility]   Procedure Report  PROCEDURE:  Implantation of a single-chamber defibrillator.  INDICATION:  _____ in a patient with history of congestive ischemic cardiomyopathy and nonsustained ventricular tachycardia.  INTRODUCTION:  The patient is a 77 year old man with history of coronary artery disease, status post myocardial infarction, with an ejection fraction of 25-30%.  He was initially enrolled in the _____ study and was randomized initially to medical therapy.  With the discontinuation of the study secondary to a marked mortality benefit in patients who received ICDs, the patient is now referred for ICD implantation.  DESCRIPTION OF PROCEDURE:  After informed consent was obtained, the patient was taken to the diagnostic EP lab in a fasting state.  After the usual preparation and draping, intravenous fentanyl and midazolam were given for sedation.  A total of 30 cc of lidocaine was infiltrated into the left infraclavicular region.  An 11 cm incision was carried out over this region and then electrocautery utilized to dissect down to the subpectoralis fascia. Twenty cubic centimeters of contrast was injected into the left upper extremity venous system, demonstrating a patent left subclavian vein.  It was subsequently punctured and an 11 French peel-away sheath advanced into the left subclavian vein.  The Guidant model [id number] 9 French bipolar defibrillation lead was inserted into the right ventricle.  After demonstrating R-waves of 18 millivolts and a ventricular pacing threshold of 0.6 volts at 0.5 milliseconds with a pacing impedance of 1143 Ohms, the device was secured to the subpectoralis fascia with a  figure-of-eight suture.  The sewing sleeve was also secured with a silk suture.  Electrocautery was then utilized to make a subcutaneous pocket.  With this obtained, attention was then turned to maintenance of hemostasis.  This was performed with electrocautery.  Kanamycin irrigation was then utilized to irrigate the wound.  Following this, the Guidant Mini IV+ AICD, model 1793, was connected to the defibrillation lead and placed in the subcutaneous pocket.  The generator was secured with a silk suture.  At this point, additional kanamycin was utilized to irrigate the wound, and defibrillation threshold testing was carried out.  The initial defibrillation threshold test was a 14-joule shock.  VF was induced with a T-wave shock, and the 14-joule shock subsequently restored sinus rhythm.  Five minutes was allowed to elapse, and a second DFT test was carried out.  This again was with a 14-joule shock.  VF was again induced, and a 14-joule shock restored sinus rhythm.  The shocking impedances were 47 Ohms on each defibrillation test.  With DFT testing carried out, the device was closed with a layer of 2-0 Vicryl, followed by a layer of 3-0 Vicryl, followed by a layer of 4-0 Vicryl to close the skin.  Benzoin was painted on the wound, and Steri-Strips were applied and a pressure dressing placed, and the patient was returned to his room in good condition.  COMPLICATIONS:  None.  RESULT:  This demonstrates successful implantation of a Guidant single-chamber defibrillator in a patient who was initially randomized to the non-ICD arm in _____.  There was no immediate procedure complication.  DD:  07/07/00 TD:  07/08/00 Job: [id number]

## 2010-10-17 NOTE — Discharge Summary (Signed)
[place]. [facility]  Patient:    [name]                       MRN: [id number] Adm. Date:  [id number] Disc. Date: 05/23/99 Attending:  Lenoria Farrier Dictator:   Joellyn Rued, P.A.C. CC:         [name], M.D. [facility]             [name], M.D.                           Discharge Summary  DATE OF BIRTH:  [birth year]  ADMITTING PHYSICIAN:  Veneda Melter, M.D.  DISCHARGE PHYSICIAN:  Jesse Sans. Wall, M.D.  SUMMARY OF HISTORY:  Mr. Gaus is a 77 year old white male with history of an acute MI with angioplasty to his RCA in May 1999.  He presents with crescendo angina with question out of hospital recent MI.  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  He ook two nitroglycerin with 100% relief within 10 to 15 minutes.  It is the first that he has taken nitroglycerin.  He has not had any "bad" episodes since that time ut he continues to have exertional chest discomfort relieved with rest.  It is also noted that he was placed in the Symphony study.  He has undergone a left carotid endarterectomy, appendectomy, history of hypothyroidism, status post radioactive iodine.  LABORATORY DATA:  X-rays are not on the chart.  EKG showed normal sinus rhythm,  left bundle branch block.  HOSPITAL COURSE:  On May 22, 1999, he underwent a cardiac catheterization y Dr. Juanda Chance.  This revealed a 30% proximal RCA lesion.  The RCA stent did not have any restenosis.  PL branch had an 80% proximal lesion and the branch off of that had a 90% lesion.  A 30% left main, 70% proximal circumflex, 30% proximal LAD, 0% mid LAD, 40% mid irregular diagonal 1.  EF was 30% with inferior akinesis and anterior apical hypokinesis.  Dr.  Juanda Chance felt that Cardiolite imaging was in order to determine if there was any potential ischemia.  On May 23, 1999, a stress Cardiolite was performed.  Utilizing the Bruce protocol, he ambulated for a total of 6 minutes and 28 seconds.  At the beginning of stage III, he did develop some chest discomfort and shortness of breath, unassociated with any EKG changes (baseline left bundle branch block).  He discontinued the test.  His symptoms were resolved in less than five minutes.  Imaging revealed EF 29%.  No signs of ischemia.  He did have inferoseptal scarring, septal hypokinesis, and inferior akinesis.  Reviewed by Dr. Daleen Squibb, it was felt that he could be discharged home. His Toprol was increased to 75 mg q.d. for better beta blockade.  It was noted that  during the stress test, in stage I, his heart rate had achieved a maximum of 138.  DISCHARGE DIAGNOSES: 1. Crescendo angina. 2. Three-vessel coronary artery disease.  It does not appear to be ischemic at his    time based on Cardiolite imaging.  History as previously described.  DISPOSITION:  He is discharged home.  DISCHARGE MEDICATIONS: 1. Synthroid 0.15 mg q.d. 2. Lipitor 10 mg q.h.s. 3. Enteric-coated  aspirin 325 mg q.d. 4. Toprol XL 75 mg q.d. 5. Sublingual nitroglycerin as needed.  DISCHARGE INSTRUCTIONS:  He was advised no lifting, driving, sexual activity, or heavy exertion for two days.  Maintain low salt/fat/cholesterol diet.  If any problems with his catheterization site, he was asked to call us immediately. He was asked to gradually resume his previous exercise program that he has neglected. He will see Dr. Antoine Poche in the office on June 12, 1999 at 9:45 a.m.  He was  also instructed, if he continued to have problems or prolonged chest discomfort, to contact us immediately or proceed to the nearest emergency room. DD:  05/23/99 TD:  05/23/99 Job: [id number]

## 2010-10-17 NOTE — Discharge Summary (Signed)
[name], [name]               ACCOUNT NO.:  [id number]   MEDICAL RECORD NO.:  [id number]          PATIENT TYPE:  INP   LOCATION:  6526                         FACILITY:  [facility]   PHYSICIAN:  Kela Millin, M.D.DATE OF BIRTH:  [birth year]   DATE OF ADMISSION:  05/13/2004  DATE OF DISCHARGE:  05/17/2004                                 DISCHARGE SUMMARY   DISCHARGE DIAGNOSES:  1.  Febrile illness, secondary to viral versus bacterial upper respiratory      infection.  2.  Hypotension with orthostasis secondary to volume depletion.  3.  Coronary artery disease, stable.  4.  History of congestive heart failure, compensated.  5.  History of Graves disease, status post ablation with hypothyroidism.  6.  History of hypercholesterolemia.  7.  History of hypertension.  8.  Remote history of tobacco abuse, question chronic obstructive pulmonary      disease.   CONSULTATIONS:  None.   PROCEDURES:  None.   HISTORY:  The patient is a 77 year old white male with a past medical  history significant for CAD, status post MI in 1999, history of LV  dysfunction with ejection fraction of 25-35% and is status post AICD  placement in 2003, history of Graves disease, status post iodine ablation in  1970, and currently on Synthroid replacement, who presented with complaints  of coughing and congestion.  The patient stated that his symptoms started on  the day prior to admission and was associated with a temperature of 103.  The patient stated that he had not been bringing up much sputum, and he  admitted to having a sick contact at home, his wife.  The patient reported  that his wife had also been having flu like symptoms, and he had received a  flu shot about one week prior to this presentation.  The patient had been  having a cough, chest congestion, fevers, malaise, and myalgias x 1 day.  He  denied chest pain and palpitations.  The patient also denied nausea,  vomiting, diarrhea, and no  constipation.  The patient saw an outpatient  physician at the urgent care and was told that he had flu and was treated  with unknown medications.  The patient reported that his symptoms had not  improved since he had been seen at the outpatient clinic and therefore, he  came to the ER for further evaluation.  In the ER, the patient had a chest x-  ray which revealed no infiltrates.  He also had a CBC which showed no  leukocytosis and he was about to be discharged home but he became  hypotensive requiring fluid resuscitation.  The patient's symptoms improved  after receiving IV fluids.   PHYSICAL EXAMINATION:  VITAL SIGNS:  Upon admission, per Dr. Nehemiah Settle,  revealed a temperature of 101.2 initially then 98.7, blood pressure  initially was 115/69 with a pulse of 123, respiratory rate of 29, and the  blood pressure dropped to 60/36 on standing with a pulse of 109, and on  lying the patient's blood pressure was 99/51.  HEENT:  His sclerae were slightly injected.  No oral lesions.  NECK:  Supple with no JVD.  LUNGS:  Clear to auscultation with no wheezes or rhonchi.  CARDIOVASCULAR:  Regular rhythm.  ABDOMEN:  Soft, nontender, nondistended.  The rest of his exam was within normal limits.   LABORATORY DATA:  His chest x-ray showed no infiltrate.  An AICD device was  noted.  His lactic acid level was 1.5.  The white cell count 6, hemoglobin  of 15.3, hematocrit 43.7, MCV 92.8, platelet count 146, neutrophil count  89%.  On his urinalysis, his specific gravity was 1.025, nitrite negative,  trace leukocyte esterase, few bacteria.  His point-of-care enzymes were  negative for MI.   HOSPITAL COURSE:  1.  Febrile illness.  Upon admission, the patient had blood and urine      cultures done.  An influenza swab was also done to evaluate for      influenza A and B and the patient was empirically started on Tamiflu as      well as Rocephin.  The blood and urine cultures were negative.  The       influenza swab was also negative.  The patient had defervescence with      the above interventions and a sinus film was also done that was negative      for sinusitis.  The patient's blood pressures stabilized with IV fluids,      and he has remained afebrile throughout his hospital stay.  His cough,      congestion, and myalgias have also improved and the patient will be      discharged at this time on oral antibiotics.  His last white cell count      was 3.3.  2.  Hypotension with orthostasis.  The patient was hydrated with IV fluids      upon admission, and his blood pressure stabilized.  His orthostatic      vital signs were rechecked and these were within normal limits also the      patient's symptoms of dizziness and weakness resolved following      hydration.  The patient also had blood and urine cultures done as      discussed above to rule out an infectious cause of hypotension and this      did not grow any bacteria.  Cardiac enzymes were also done to evaluate      for cardiac etiology and his cardiac enzymes were negative for MI.  The      patient has remained hemodynamically stable throughout his hospital stay      and has been restarted on his antihypertensives.  He will be discharged      at this time to follow up with his primary care physician.  3.  Coronary artery disease, stable.  The patient was restarted on his      Toprol and Altace, was maintained on aspirin as well during his hospital      stay.  As discussed above, cardiac enzymes were done and these were      negative for MI.  The patient has remained chest pain free and      hemodynamically stable throughout his hospital stay.  4.  Hypertension.  As already discussed, the patient was initially      hypotensive upon admission but following hydration his blood pressures      stabilized, and he was restarted on his antihypertensives but at half      the dose, Toprol XL 100 mg p.o.  every day.  He will be discharged  on     Altace 5 mg every day as well.  The patient is to follow up with his      primary care physician in the next week and at which time his blood      pressure will be rechecked and his medications adjusted as clinically      appropriate.  5.  Hypothyroidism.  The patient was maintained on Synthroid during his      hospital stay.  6.  History of congestive heart failure, compensated.  The patient did not      have any shortness of breath or pedal edema throughout his hospital      stay.  7.  Hyperlipidemia.  The patient was maintained on Lipitor during his      hospital stay.  8.  Status post AICD.  9.  Question COPD.  The patient with a remote history of tobacco abuse and      during his hospital stay he was noted to have wheezes on his lung exam      and was started on bronchodilators.  He was oxygenating well throughout      his hospital stay.  He will be discharged on Combivent.   DISCHARGE MEDICATIONS:  1.  Ceftin 250 mg one p.o. b.i.d. x 7 days.  2.  Tussionex 5 cc p.o. q.12h. p.r.n.  3.  Combivent two puffs q.i.d.  4.  Protonix 40 mg one p.o. every day.  5.  Toprol XL 100 mg one p.o. every day.  6.  Altace 5 mg one p.o. every day.  7.  Lipitor 20 mg one p.o. every day.  8.  Synthroid 0.15 mcg one p.o. every day.   FOLLOWUP CARE:  [place] in 3-5  days.   DISCHARGE CONDITION:  Improved, stable.   DISCHARGE DIET:  Cardiac.       ACV/MEDQ  D:  05/17/2004  T:  05/17/2004  Job:  [id number]   cc:   [name]  [address]  [place]  [place] [address]  Fax: [phone]

## 2010-10-17 NOTE — Assessment & Plan Note (Signed)
[facility]                           ELECTROPHYSIOLOGY OFFICE NOTE   NAME:Hipple, EMET RAFANAN                      MRN:          [id number]  DATE:03/15/2006                            DOB:          07-27-1933    REASON FOR VISIT:  Mr. Cuffe was seen today in the clinic on March 15, 2006 for a wound check of his newly changed out, upgraded [employer].  Date of implant was March 05, 2006 for cardiomyopathy and  nonsustained ventricular tachycardia.  On interrogation of his device today  his battery voltage is 3.24 with charge time of 6.9 seconds.  R wave  measures greater than 25 millivolts with a ventricular pacing threshold of 1  volt at 0.5 milliseconds and a ventricular lead impendence of 754.  Shock  impedence was 59.  There was one therapy delivered that was induced at time  of implant.  No other therapies.  No changes were made in his parameters.  His Steri-Strips are removed.  His wound is without redness or edema.  He  will be seen again in three months' time.      ______________________________  Altha Harm, LPN    ______________________________  Doylene Canning. Ladona Ridgel, MD    PO/MedQ  DD:  03/15/2006  DT:  03/15/2006  Job #:  [id number]

## 2010-10-17 NOTE — Discharge Summary (Signed)
NAMESHARRIEFF, SPRATLIN NO.:  [id number]   MEDICAL RECORD NO.:  [id number]          PATIENT TYPE:  INP   LOCATION:  3705                         FACILITY:  [facility]   PHYSICIAN:  Learta Codding, MD,FACC DATE OF BIRTH:  [birth year]   DATE OF ADMISSION:  03/05/2006  DATE OF DISCHARGE:                                 DISCHARGE SUMMARY   PROCEDURES:  1. ICD generator removal and insertion of a new ICD generator along with      new rate/sensing lead insertion.   Time at discharge 31 minutes.   PRIMARY DIAGNOSIS:  Ventricular tachycardia.   SECONDARY DIAGNOSIS:  Ischemic cardiomyopathy with an ejection fraction of  29% by Cardiolite in 2000.  1. Status post laparoscopic cholecystectomy.  2. History of chronic systolic congestive heart failure.  3. History of Graves' disease with thyroid ablation.  4. Iatrogenic hypothyroidism.  5. Hypercholesterolemia.  6. Hypertension.  7. Status post appendectomy.  8. Carotid vascular disease with a left carotid endarterectomy in 2000.  9. Status post implantation of a single chamber defibrillator in 2002.  10.Status post PTCA to his RCA in May 1999.  11.Status post cardiac catheterization in December 2000 with no in-stent      restenosis in the RCA, PL 80%/90%, circumflex 70%, LAD 30%, D1 40% and      ejection fraction 30%.   HOSPITAL COURSE:  Mr. Langdon is a 77 year old male with known coronary  artery disease and history of VT who had a previous ICD.  His defibrillator  later was evaluated and he had elevated pacer impedance and threshold, not  present at the time of initial implant. He was evaluated by Dr. Ladona Ridgel and  it was felt that the best treatment option was an ICD generator change and  lead revision.  He was admitted to the hospital for this on March 05, 2006.  .   Mr. Raper had a Environmental manager ICD change out with a new rate/sensing  lead on March 05, 2006.The VVI pacing was set of 40.  A normal device  function was seen after insertion.   On March 06, 2006, Mr. Bambrick's site was without significant hematoma or  ecchymosis.  He was hypotensive with a systolic blood pressure in the high  80s and his Avapro was held.  A CBC was performed which showed a hemoglobin  of 12.1, hematocrit 35 WBCs 5.4 and platelets 120.  His chest x-ray showed a  new left AICD, but no pneumothorax and no active cardiopulmonary disease.   On March 06, 2006 p.m. Mr. Arave's blood pressure was 116/56 with a  heart rate of 72.  He was ambulating well and considered stable for  discharge with outpatient follow-up arranged.   DISCHARGE INSTRUCTIONS:  1. His activities is to be per the sheet.  2. He is to keep his incision dry for the next 7 days.  3. He is to take Keflex 250 mg 4 times a day  4. He is to follow up with the [facility] on October 15 at 9:00 a.m. for a      wound  check and see Dr. Ladona Ridgel January 15.  5. He is to follow up with Dr. Samuel Germany as needed or as scheduled.   DISCHARGE MEDICATIONS:  1. Claritin 10 mg every day.  2. Aspirin 325 mg every day,  3. Synthroid 0.15 mg every day.  4. Lipitor 20 mg a day.  5. Toprol XL 200 mg a daily.  6. Furosemide 40 mg a day.  7. Benicar are 20 mg a day.  8. Protonix 40 mg as needed.     ______________________________  Theodore Demark, PA-C      Learta Codding, MD,FACC  Electronically Signed    RB/MEDQ  D:  03/06/2006  T:  03/08/2006  Job:  [id number]   cc:   [name]

## 2010-10-17 NOTE — Op Note (Signed)
NAMEWALTON, DIGILIO NO.:  [id number]   MEDICAL RECORD NO.:  [id number]          PATIENT TYPE:  INP   LOCATION:  2807                         FACILITY:  [facility]   PHYSICIAN:  Doylene Canning. Ladona Ridgel, MD    DATE OF BIRTH:  10/27/1933   DATE OF PROCEDURE:  03/05/2006  DATE OF DISCHARGE:                                 OPERATIVE REPORT   PROCEDURE PERFORMED:  Removal of a previously implanted ICD which is at end-  of-life, insertion of a new rate sense ventricular lead, and insertion of a  new Guidant Vitality II ICD in a patient with an ischemic cardiomyopathy and  ventricular tachycardia.   INTRODUCTION:  The patient is a 77 year old male with a history of sustained  monomorphic VT and ischemic cardiomyopathy who underwent ICD implantation  back in 2002.  The patient was subsequently found to be at end-of-life.  Of  note, his pacing thresholds have increased, his impedance has increased over  the last several years with an impedance of 1600 ohms.  He is now referred  for removal of his previously implanted ICD which is at end-of-life and  insertion of a new rate sensing lead.   PROCEDURE:  After informed consent was obtained, the patient was taken to  the diagnostic EP lab in a fasting state.  After the usual preparation and  draping, intravenous fentanyl and midazolam was given for sedation.  30 mL  of lidocaine was infiltrated in the old infraclavicular insertion site and a  7 cm incision was carried out over this region.  Electrocautery was utilized  to dissect down to the fascial plane.  The subcutaneous pocket was entered  without difficulty.  The new Guidant model [id number] flexed bipolar active  fixation pacing lead was advanced into the right ventricle and mapping was  carried out.  At the final site on the RV septum, the R-waves measured 19  and the pacing impedance was 958 ohms, threshold 1 volt at 0.5 milliseconds.  With these satisfactory parameters, the  lead was secured to the  subpectoralis fascia with a figure-of-eight silk suture.  The sewing sleeve  was also secured with a silk suture.  Electrocautery was utilized to expand  the previously placed subcutaneous pocket.  The old Guidant rate sense  defibrillation lead rate sense portion of the lead was capped.  The Guidant  Vitality II ICD, model T175, serial number [id number], was connected to the new  rate sense lead in the old defibrillation portion of the old lead and placed  back in the subcutaneous pocket.  Additional kanamycin was utilized to  irrigate the pocket.  The patient was more deeply sedated and defibrillation  threshold testing carried out.   VF was induced with a T-wave shock and a 14 joules shock was delivered which  terminated VF and restored sinus rhythm.  The shocking impedance was 48  ohms.  With these satisfactory parameters, the pocket was again irrigated  with kanamycin and the incision closed with a layer of 2-0 Vicryl followed  by a layer of 3-0 Vicryl followed by a layer of  4-0 Vicryl.  Benzoin was  painted on the skin, Steri-Strips were applied, and a pressure dressing was  placed.  The patient was returned to his room in satisfactory condition.   COMPLICATIONS:  There were no immediate procedure complications.   RESULTS:  This demonstrates successful implantation of a new Guidant ICD  with a new ICD rate sense lead insertion and removal of the previous  implanted defibrillation system.           ______________________________  Doylene Canning. Ladona Ridgel, MD     GWT/MEDQ  D:  03/05/2006  T:  03/06/2006  Job:  [id number]   cc:   [name]

## 2010-10-20 ENCOUNTER — Ambulatory Visit (INDEPENDENT_AMBULATORY_CARE_PROVIDER_SITE_OTHER): Payer: Medicare Other | Admitting: Cardiology

## 2010-10-20 ENCOUNTER — Encounter: Payer: Self-pay | Admitting: Cardiology

## 2010-10-20 DIAGNOSIS — I509 Heart failure, unspecified: Secondary | ICD-10-CM

## 2010-10-20 DIAGNOSIS — R609 Edema, unspecified: Secondary | ICD-10-CM

## 2010-10-20 DIAGNOSIS — I10 Essential (primary) hypertension: Secondary | ICD-10-CM

## 2010-10-20 DIAGNOSIS — I219 Acute myocardial infarction, unspecified: Secondary | ICD-10-CM

## 2010-10-20 DIAGNOSIS — Z95810 Presence of automatic (implantable) cardiac defibrillator: Secondary | ICD-10-CM

## 2010-10-20 NOTE — Patient Instructions (Signed)
Follow up in October with Dr Antoine Poche Continue current medications

## 2010-10-20 NOTE — Progress Notes (Signed)
HPI The patient presents for followup of his cardiomyopathy. At the last appointment he had some increased edema. I gave him a slightly higher dose of Lasix and he has been wearing compression stockings. He still has some lower extremity swelling but is comfortable with this. He has had no new shortness of breath, PND orthopnea. He has had no new palpitations, presyncope or syncope. He has no chest pressure, neck or arm discomfort. He has had stable weight. He did have his defibrillator generator changed recently.   Allergies  Allergen Reactions  . Sulfa Antibiotics     Current Outpatient Prescriptions  Medication Sig Dispense Refill  . ACCU-CHEK AVIVA PLUS test strip       . atorvastatin (LIPITOR) 40 MG tablet Take 1 tablet by mouth Daily.      . fluticasone (FLONASE) 50 MCG/ACT nasal spray 2 sprays by Nasal route daily.        . furosemide (LASIX) 40 MG tablet Take 1 tablet by mouth Daily.      Marland Kitchen glimepiride (AMARYL) 2 MG tablet Take 1 tablet by mouth Daily.      Marland Kitchen levothyroxine (SYNTHROID, LEVOTHROID) 150 MCG tablet Take 1 tablet by mouth Daily.      . metoprolol (TOPROL-XL) 200 MG 24 hr tablet Take 1 tablet by mouth Daily.      Marland Kitchen olmesartan (BENICAR) 20 MG tablet Take 20 mg by mouth daily.        Marland Kitchen omeprazole (PRILOSEC) 20 MG capsule Take 1 tablet by mouth Daily.      Marland Kitchen QVAR 80 MCG/ACT inhaler Take 2 puffs by mouth at bedtime.        Past Medical History  Diagnosis Date  . Atrial flutter   . DM2 (diabetes mellitus, type 2)   . HYPERTENSION   . CHF CONGESTIVE HEART FAILURE   . Edema   . WEIGHT GAIN, ABNORMAL   . DYSPNEA   . AUTOMATIC IMPLANTABLE CARDIAC DEFIBRILLATOR SITU   . DYSLIPIDEMIA   . HYPOTHYROIDISM   . MYOCARDIAL INFARCTION     Past Surgical History  Procedure Date  . Cardiac defibrillator placement   . Angioplasty     stent  . Appendectomy 1957  . Eye surgery 1985  . Carotid stent     ROS:  As stated in the HPI and negative for all other  systems.  PHYSICAL EXAM BP 112/60  Pulse 51  Resp 14  Ht 5\' 9"  (1.753 m)  Wt 193 lb (87.544 kg)  BMI 28.50 kg/m2 GENERAL:  Well appearing HEENT:  Pupils equal round and reactive, fundi not visualized, oral mucosa unremarkable, dentures NECK:  No jugular venous distention, waveform within normal limits, carotid upstroke brisk and symmetric, no bruits, no thyromegaly LYMPHATICS:  No cervical, inguinal adenopathy LUNGS:  Clear to auscultation bilaterally BACK:  No CVA tenderness CHEST:  ICD intact.  Defib wire prominent but not perforating. HEART:  PMI not displaced or sustained,S1 and S2 within normal limits, no S3, no S4, no clicks, no rubs, no murmurs ABD:  Flat, positive bowel sounds normal in frequency in pitch, no bruits, no rebound, no guarding, no midline pulsatile mass, no hepatomegaly, no splenomegaly EXT:  2 plus pulses throughout, mild edema, no cyanosis no clubbing SKIN:  No rashes no nodules NEURO:  Cranial nerves II through XII grossly intact, motor grossly intact throughout PSYCH:  Cognitively intact, oriented to person place and time   EKG:  Sinus rhythm, left bundle branch block, rate 51  ASSESSMENT AND PLAN

## 2010-10-20 NOTE — Assessment & Plan Note (Signed)
The patient has no new sypmtoms.  No further cardiovascular testing is indicated.  We will continue with aggressive risk reduction and meds as listed.  

## 2010-10-20 NOTE — Assessment & Plan Note (Signed)
The blood pressure is at target. No change in medications is indicated. We will continue with therapeutic lifestyle changes (TLC).  

## 2010-10-20 NOTE — Assessment & Plan Note (Signed)
He seems to be relatively euvolemic and he will continue with meds as listed.

## 2010-10-20 NOTE — Assessment & Plan Note (Signed)
This is stable and he will continue on the meds as listed.

## 2010-11-25 ENCOUNTER — Encounter: Payer: Medicare Other | Admitting: Internal Medicine

## 2010-12-25 ENCOUNTER — Ambulatory Visit (INDEPENDENT_AMBULATORY_CARE_PROVIDER_SITE_OTHER): Payer: PRIVATE HEALTH INSURANCE | Admitting: *Deleted

## 2010-12-25 DIAGNOSIS — I428 Other cardiomyopathies: Secondary | ICD-10-CM

## 2010-12-26 ENCOUNTER — Other Ambulatory Visit: Payer: Self-pay | Admitting: Internal Medicine

## 2010-12-27 LAB — REMOTE ICD DEVICE
BRDY-0002RV: 40 {beats}/min
CHARGE TIME: 8.8 s
DEV-0020ICD: NEGATIVE
RV LEAD AMPLITUDE: 11.9 mv
TOT-0006: 20120424000000
TZAT-0001FASTVT: 1
TZAT-0013FASTVT: 2
TZAT-0018FASTVT: NEGATIVE
TZAT-0018FASTVT: NEGATIVE
TZST-0001FASTVT: 3
TZST-0001FASTVT: 4
TZST-0001FASTVT: 7
TZST-0001FASTVT: 8
TZST-0003FASTVT: 41 J
TZST-0003FASTVT: 41 J
VENTRICULAR PACING ICD: 0 pct

## 2010-12-30 NOTE — Progress Notes (Signed)
icd remote  

## 2011-01-12 ENCOUNTER — Encounter: Payer: Self-pay | Admitting: Cardiology

## 2011-04-21 ENCOUNTER — Ambulatory Visit: Payer: PRIVATE HEALTH INSURANCE | Admitting: Cardiology

## 2011-05-12 ENCOUNTER — Encounter: Payer: Self-pay | Admitting: Internal Medicine

## 2011-05-22 ENCOUNTER — Ambulatory Visit (INDEPENDENT_AMBULATORY_CARE_PROVIDER_SITE_OTHER): Payer: Medicare Other | Admitting: Cardiology

## 2011-05-22 ENCOUNTER — Encounter: Payer: Self-pay | Admitting: Cardiology

## 2011-05-22 VITALS — BP 101/59 | HR 58 | Ht 69.0 in | Wt 199.0 lb

## 2011-05-22 DIAGNOSIS — I509 Heart failure, unspecified: Secondary | ICD-10-CM

## 2011-05-22 DIAGNOSIS — E785 Hyperlipidemia, unspecified: Secondary | ICD-10-CM

## 2011-05-22 DIAGNOSIS — I2510 Atherosclerotic heart disease of native coronary artery without angina pectoris: Secondary | ICD-10-CM

## 2011-05-22 DIAGNOSIS — I6529 Occlusion and stenosis of unspecified carotid artery: Secondary | ICD-10-CM

## 2011-05-22 DIAGNOSIS — I4892 Unspecified atrial flutter: Secondary | ICD-10-CM

## 2011-05-22 DIAGNOSIS — I10 Essential (primary) hypertension: Secondary | ICD-10-CM

## 2011-05-22 HISTORY — DX: Occlusion and stenosis of unspecified carotid artery: I65.29

## 2011-05-22 HISTORY — DX: Atherosclerotic heart disease of native coronary artery without angina pectoris: I25.10

## 2011-05-22 NOTE — Assessment & Plan Note (Signed)
I will repeat carotid Dopplers as it has been several years.

## 2011-05-22 NOTE — Assessment & Plan Note (Signed)
The patient needs continued risk reduction. It has been several years since last stress testing. He could not have an exercise treadmill test was his baseline EKG abnormality. Therefore, he will have a [employer].

## 2011-05-22 NOTE — Patient Instructions (Addendum)
The current medical regimen is effective;  continue present plan and medications.  Please have fasting lab work when you come back for your stress test.  Your physician has requested that you have a carotid duplex. This test is an ultrasound of the carotid arteries in your neck. It looks at blood flow through these arteries that supply the brain with blood. Allow one hour for this exam. There are no restrictions or special instructions.  Your physician has requested that you have a lexiscan myoview. For further information please visit [email or web address] Please follow instruction sheet, as given.

## 2011-05-22 NOTE — Assessment & Plan Note (Signed)
I will have him get a lipid profile when he returns.

## 2011-05-22 NOTE — Progress Notes (Signed)
Addended by: Sharin Grave on: 05/22/2011 04:36 PM   Modules accepted: Orders

## 2011-05-22 NOTE — Assessment & Plan Note (Signed)
The blood pressure is at target. No change in medications is indicated. We will continue with therapeutic lifestyle changes (TLC).  

## 2011-05-22 NOTE — Progress Notes (Signed)
HPI The patient presents for followup of his coronary and cardiomyopathy.  Since I last saw him he continues denies any new symptoms.  He did have some increased dyspnea earlier this year. He's had continued lower externally swelling. He's not having any chest pressure, neck or arm discomfort. He doesn't have any palpitations, presyncope or syncope. His weights have been slowly creeping up but he thinks he's not eating properly and not exercising enough.  Allergies  Allergen Reactions  . Sulfa Antibiotics     Current Outpatient Prescriptions  Medication Sig Dispense Refill  . ACCU-CHEK AVIVA PLUS test strip       . atorvastatin (LIPITOR) 40 MG tablet Take 1 tablet by mouth Daily.      . carbonyl iron ([facility] IRON) 45 MG TABS Take 45 mg by mouth daily.        . fexofenadine (ALLEGRA) 180 MG tablet Take 180 mg by mouth daily.        . fluticasone (FLONASE) 50 MCG/ACT nasal spray 2 sprays by Nasal route daily.        . furosemide (LASIX) 40 MG tablet Take 2 tablets by mouth Daily.       Marland Kitchen glimepiride (AMARYL) 2 MG tablet Take 2 tablets by mouth Daily.       Marland Kitchen levothyroxine (SYNTHROID, LEVOTHROID) 150 MCG tablet Take 1 tablet by mouth Daily.      . metoprolol (TOPROL-XL) 200 MG 24 hr tablet Take 1 tablet by mouth Daily.      . Multiple Vitamin (MULTIVITAMIN) capsule Take 1 capsule by mouth daily.        Marland Kitchen olmesartan (BENICAR) 20 MG tablet Take 20 mg by mouth daily.        Marland Kitchen omeprazole (PRILOSEC) 20 MG capsule Take 1 tablet by mouth Daily.      Marland Kitchen QVAR 80 MCG/ACT inhaler Take 2 puffs by mouth at bedtime.        Past Medical History  Diagnosis Date  . Atrial flutter   . DM2 (diabetes mellitus, type 2)   . HYPERTENSION   . CHF CONGESTIVE HEART FAILURE   . Edema   . WEIGHT GAIN, ABNORMAL   . DYSPNEA   . AUTOMATIC IMPLANTABLE CARDIAC DEFIBRILLATOR SITU   . DYSLIPIDEMIA   . HYPOTHYROIDISM   . MYOCARDIAL INFARCTION     Past Surgical History  Procedure Date  . Cardiac defibrillator  placement   . Angioplasty     stent  . Appendectomy 1957  . Eye surgery 1985  . Carotid stent    ROS:  As stated in the HPI and negative for all other systems.  PHYSICAL EXAM BP 101/59  Pulse 58  Ht 5\' 9"  (1.753 m)  Wt 199 lb (90.266 kg)  BMI 29.39 kg/m2 GENERAL:  Well appearing HEENT:  Pupils equal round and reactive, fundi not visualized, oral mucosa unremarkable NECK:  No jugular venous distention, waveform within normal limits, carotid upstroke brisk and symmetric, no bruits, no thyromegaly LYMPHATICS:  No cervical, inguinal adenopathy LUNGS:  Clear to auscultation bilaterally BACK:  No CVA tenderness CHEST:  ICD HEART:  PMI not displaced or sustained,S1 and S2 within normal limits, no S3, no S4, no clicks, no rubs, no murmurs ABD:  Flat, positive bowel sounds normal in frequency in pitch, no bruits, no rebound, no guarding, no midline pulsatile mass, no hepatomegaly, no splenomegaly EXT:  2 plus pulses upper with diminished DP/PT lower, moderate left greater than right edema, no cyanosis no clubbing SKIN:  No rashes no nodules NEURO:  Cranial nerves II through XII grossly intact, motor grossly intact throughout PSYCH:  Cognitively intact, oriented to person place and time  EKG:  Sinus bradycardia, rate 55, left bundle branch block no change from previous.  05/22/2011   ASSESSMENT AND PLAN

## 2011-05-22 NOTE — Assessment & Plan Note (Signed)
He has some mild edema but no chest congestion.  He will otherwise continue on the meds as listed.

## 2011-06-11 ENCOUNTER — Encounter: Payer: Self-pay | Admitting: Internal Medicine

## 2011-06-11 ENCOUNTER — Other Ambulatory Visit: Payer: Self-pay | Admitting: Internal Medicine

## 2011-06-11 ENCOUNTER — Ambulatory Visit (INDEPENDENT_AMBULATORY_CARE_PROVIDER_SITE_OTHER): Payer: Medicare Other | Admitting: *Deleted

## 2011-06-11 DIAGNOSIS — I472 Ventricular tachycardia: Secondary | ICD-10-CM

## 2011-06-14 LAB — REMOTE ICD DEVICE
BRDY-0002RV: 40 {beats}/min
DEV-0020ICD: NEGATIVE
DEVICE MODEL ICD: 114258
FVT: 0
HV IMPEDENCE: 63 Ohm
PACEART VT: 0
TOT-0006: 20121004000000
TZAT-0001FASTVT: 1
TZAT-0013FASTVT: 2
TZAT-0018FASTVT: NEGATIVE
TZST-0001FASTVT: 4
TZST-0001FASTVT: 5
TZST-0001FASTVT: 8
TZST-0003FASTVT: 41 J
TZST-0003FASTVT: 41 J
TZST-0003FASTVT: 41 J
VENTRICULAR PACING ICD: 0 pct
VF: 0

## 2011-06-17 NOTE — Progress Notes (Signed)
ICD remote 

## 2011-06-22 ENCOUNTER — Other Ambulatory Visit (INDEPENDENT_AMBULATORY_CARE_PROVIDER_SITE_OTHER): Payer: Medicare Other | Admitting: *Deleted

## 2011-06-22 ENCOUNTER — Encounter: Payer: Medicare Other | Admitting: *Deleted

## 2011-06-22 ENCOUNTER — Ambulatory Visit (HOSPITAL_COMMUNITY): Payer: Medicare Other | Attending: Cardiology | Admitting: Radiology

## 2011-06-22 VITALS — BP 107/65 | Ht 69.0 in | Wt 195.0 lb

## 2011-06-22 DIAGNOSIS — I447 Left bundle-branch block, unspecified: Secondary | ICD-10-CM | POA: Insufficient documentation

## 2011-06-22 DIAGNOSIS — Z87891 Personal history of nicotine dependence: Secondary | ICD-10-CM | POA: Insufficient documentation

## 2011-06-22 DIAGNOSIS — I2510 Atherosclerotic heart disease of native coronary artery without angina pectoris: Secondary | ICD-10-CM

## 2011-06-22 DIAGNOSIS — R0609 Other forms of dyspnea: Secondary | ICD-10-CM | POA: Insufficient documentation

## 2011-06-22 DIAGNOSIS — E119 Type 2 diabetes mellitus without complications: Secondary | ICD-10-CM | POA: Insufficient documentation

## 2011-06-22 DIAGNOSIS — I219 Acute myocardial infarction, unspecified: Secondary | ICD-10-CM

## 2011-06-22 DIAGNOSIS — I779 Disorder of arteries and arterioles, unspecified: Secondary | ICD-10-CM | POA: Insufficient documentation

## 2011-06-22 DIAGNOSIS — Z95810 Presence of automatic (implantable) cardiac defibrillator: Secondary | ICD-10-CM | POA: Insufficient documentation

## 2011-06-22 DIAGNOSIS — E785 Hyperlipidemia, unspecified: Secondary | ICD-10-CM

## 2011-06-22 DIAGNOSIS — R0989 Other specified symptoms and signs involving the circulatory and respiratory systems: Secondary | ICD-10-CM | POA: Insufficient documentation

## 2011-06-22 DIAGNOSIS — Z9861 Coronary angioplasty status: Secondary | ICD-10-CM | POA: Insufficient documentation

## 2011-06-22 DIAGNOSIS — I10 Essential (primary) hypertension: Secondary | ICD-10-CM | POA: Insufficient documentation

## 2011-06-22 DIAGNOSIS — R0602 Shortness of breath: Secondary | ICD-10-CM

## 2011-06-22 LAB — LIPID PANEL: Total CHOL/HDL Ratio: 4

## 2011-06-22 MED ORDER — ADENOSINE (DIAGNOSTIC) 3 MG/ML IV SOLN
0.5600 mg/kg | Freq: Once | INTRAVENOUS | Status: AC
  Administered 2011-06-22: 49.5 mg via INTRAVENOUS

## 2011-06-22 MED ORDER — TECHNETIUM TC 99M TETROFOSMIN IV KIT
33.0000 | PACK | Freq: Once | INTRAVENOUS | Status: AC | PRN
  Administered 2011-06-22: 33 via INTRAVENOUS

## 2011-06-22 MED ORDER — TECHNETIUM TC 99M TETROFOSMIN IV KIT
11.0000 | PACK | Freq: Once | INTRAVENOUS | Status: AC | PRN
  Administered 2011-06-22: 11 via INTRAVENOUS

## 2011-06-22 NOTE — Progress Notes (Signed)
[facility] SITE 3 NUCLEAR MED [address] [place] [address] [phone]  Cardiology Nuclear Med Study  [name] is a 78 y.o. male [id number] 03-Jan-1934   Nuclear Med Background Indication for Stress Test:  Evaluation for Ischemia and PTCA/Stent Patency  History:  Atrial Flutter; PAF; ICM; MI>PTCA/Stent-RCA; '02 AICD; '03 JIR:CVELFYBO wall infarct, no ischemia, EF=40%; '10 Echo:EF=30-35%, Trivial MR, mild TR Cardiac Risk Factors: Carotid Disease, History of Smoking, Hypertension, LBBB, Lipids and NIDDM  Symptoms:  DOE   Nuclear Pre-Procedure Caffeine/Decaff Intake:  None NPO After: 8:00pm   Lungs:  Clear.  O2 SAT 98% on RA IV 0.9% NS with Angio Cath:  20g  IV Site: R Antecubital  IV Started by:  Stanton Kidney, EMT-P  Chest Size (in):  46 Cup Size: n/a  Height: 5\' 9"  (1.753 m)  Weight:  195 lb (88.451 kg)  BMI:  Body mass index is 28.80 kg/(m^2). Tech Comments:  Toprol held > 36 hours, per patient. CBG= 109 @ 6:30 am, per patient.    Nuclear Med Study 1 or 2 day study: 1 day  Stress Test Type:  Adenosine  Reading MD: Olga Millers, MD  Order Authorizing Provider:  [name], MD  Resting Radionuclide: Technetium 38m Tetrofosmin  Resting Radionuclide Dose: 10.3 mCi   Stress Radionuclide:  Technetium 32m Tetrofosmin  Stress Radionuclide Dose: 33.0 mCi           Stress Protocol Rest HR: 80 Stress HR: 103  Rest BP: 107/65 Stress BP: 134/57  Exercise Time (min): n/a METS: n/a   Predicted Max HR: 145 bpm % Max HR: 71.03 bpm Rate Pressure Product: 17510   Dose of Adenosine (mg):  49.6 Dose of Lexiscan: n/a mg  Dose of Atropine (mg): n/a Dose of Dobutamine: n/a mcg/kg/min (at max HR)  Stress Test Technologist: Smiley Houseman, CMA-N  Nuclear Technologist:  Domenic Polite, CNMT     Rest Procedure:  Myocardial perfusion imaging was performed at rest 45 minutes following the intravenous administration of Technetium 34m Tetrofosmin.  Rest  ECG: LBBB with occasional PVC.  Stress Procedure:  The patient received IV adenosine at 140 mcg/kg/min for 4-minutes.  There was a very brief episode of 2nd degree AVB with infusion.  He did c/o chest tightness, 10/10, with infusion.  Technetium 25m Tetrofosmin was injected at the 2-minute mark and quantitative spect images were obtained after a 45 minute delay.  Stress ECG: Uninteretable due to baseline LBBB  QPS Raw Data Images:  Acquisition technically good; LVE. Stress Images:  There is decreased uptake in the inferior wall and apex. Rest Images:  There is decreased uptake in the inferior wall and apex. Subtraction (SDS):  There is a fixed defect that is most consistent with a previous infarction. Transient Ischemic Dilatation (Normal <1.22):  1.05 Lung/Heart Ratio (Normal <0.45):  0.34  Quantitative Gated Spect Images QGS EDV:  124 ml QGS ESV:  76 ml QGS cine images:  Global hypokinesis and inferior akinesis. QGS EF: 39%  Impression Exercise Capacity:  Adenosine study with no exercise. BP Response:  Normal blood pressure response. Clinical Symptoms:  There is chest tightness. ECG Impression:  Baseline:  LBBB.  EKG uninterpretable due to LBBB at rest and stress. Comparison with Prior Nuclear Study: No images to compare  Overall Impression:  Abnormal stress nuclear study with a large, fixed inferior and apical defect consistent with previous infarct; no ischemia.  Olga Millers

## 2011-06-23 ENCOUNTER — Encounter (INDEPENDENT_AMBULATORY_CARE_PROVIDER_SITE_OTHER): Payer: Medicare Other | Admitting: *Deleted

## 2011-06-23 DIAGNOSIS — I6529 Occlusion and stenosis of unspecified carotid artery: Secondary | ICD-10-CM

## 2011-06-25 ENCOUNTER — Telehealth: Payer: Self-pay | Admitting: *Deleted

## 2011-06-25 ENCOUNTER — Encounter: Payer: Self-pay | Admitting: *Deleted

## 2011-06-25 NOTE — Telephone Encounter (Signed)
Called and left message of results and to call back if questions

## 2011-06-25 NOTE — Telephone Encounter (Signed)
Message copied by Sharin Grave on Thu Jun 25, 2011  6:51 PM ------      Message from: [name]      Created: Tue Jun 23, 2011  1:54 PM       Please let him know that his stress test was not high risk.  He has an old infarct but no new findings.  Continue with medical management.

## 2011-07-10 ENCOUNTER — Telehealth: Payer: Self-pay | Admitting: Internal Medicine

## 2011-07-10 NOTE — Telephone Encounter (Signed)
05-12-11 sent past past due letter/mt 07-10-11 pt did home transmission 06-11-11, per paula was normal, due to see taylor in June, will send recall/mt

## 2011-09-10 ENCOUNTER — Ambulatory Visit (INDEPENDENT_AMBULATORY_CARE_PROVIDER_SITE_OTHER): Payer: Medicare Other | Admitting: *Deleted

## 2011-09-10 DIAGNOSIS — I428 Other cardiomyopathies: Secondary | ICD-10-CM

## 2011-09-12 ENCOUNTER — Encounter: Payer: Self-pay | Admitting: Internal Medicine

## 2011-09-15 LAB — REMOTE ICD DEVICE
DEV-0020ICD: NEGATIVE
FVT: 0
PACEART VT: 0
TOT-0006: 20130107000000
TZAT-0001FASTVT: 1
TZAT-0013FASTVT: 2
TZAT-0018FASTVT: NEGATIVE
TZAT-0018FASTVT: NEGATIVE
TZON-0003FASTVT: 352.9 ms
TZST-0001FASTVT: 3
TZST-0001FASTVT: 8
TZST-0003FASTVT: 41 J
TZST-0003FASTVT: 41 J
VENTRICULAR PACING ICD: 0 pct
VF: 0

## 2011-09-17 ENCOUNTER — Encounter: Payer: Self-pay | Admitting: *Deleted

## 2011-09-17 NOTE — Progress Notes (Signed)
Remote icd check  

## 2011-12-10 ENCOUNTER — Ambulatory Visit (INDEPENDENT_AMBULATORY_CARE_PROVIDER_SITE_OTHER): Payer: Medicare Other | Admitting: Cardiology

## 2011-12-10 ENCOUNTER — Encounter: Payer: Self-pay | Admitting: Cardiology

## 2011-12-10 ENCOUNTER — Ambulatory Visit (INDEPENDENT_AMBULATORY_CARE_PROVIDER_SITE_OTHER): Payer: Medicare Other | Admitting: *Deleted

## 2011-12-10 ENCOUNTER — Encounter: Payer: Self-pay | Admitting: Internal Medicine

## 2011-12-10 VITALS — BP 102/50 | HR 50 | Ht 69.0 in | Wt 196.1 lb

## 2011-12-10 DIAGNOSIS — E785 Hyperlipidemia, unspecified: Secondary | ICD-10-CM

## 2011-12-10 DIAGNOSIS — I428 Other cardiomyopathies: Secondary | ICD-10-CM

## 2011-12-10 DIAGNOSIS — Z95810 Presence of automatic (implantable) cardiac defibrillator: Secondary | ICD-10-CM

## 2011-12-10 DIAGNOSIS — I6529 Occlusion and stenosis of unspecified carotid artery: Secondary | ICD-10-CM

## 2011-12-10 DIAGNOSIS — R0989 Other specified symptoms and signs involving the circulatory and respiratory systems: Secondary | ICD-10-CM

## 2011-12-10 DIAGNOSIS — I2510 Atherosclerotic heart disease of native coronary artery without angina pectoris: Secondary | ICD-10-CM

## 2011-12-10 DIAGNOSIS — R609 Edema, unspecified: Secondary | ICD-10-CM

## 2011-12-10 LAB — ICD DEVICE OBSERVATION
DEVICE MODEL ICD: 114258
HV IMPEDENCE: 65 Ohm
RV LEAD THRESHOLD: 1.5 V
TZAT-0001FASTVT: 1
TZAT-0013FASTVT: 2
TZAT-0018FASTVT: NEGATIVE
TZON-0003FASTVT: 352.9 ms
TZST-0001FASTVT: 6
TZST-0001FASTVT: 7
TZST-0003FASTVT: 26 J
TZST-0003FASTVT: 41 J
VENTRICULAR PACING ICD: 1 pct

## 2011-12-10 NOTE — Assessment & Plan Note (Signed)
He has 0-39% bilateral stenosis. He will have followup in January 2015.

## 2011-12-10 NOTE — Assessment & Plan Note (Signed)
Lab Results  Component Value Date   CHOL 143 06/22/2011   HDL 36.60* 06/22/2011   LDLCALC 81 06/22/2011   TRIG 125.0 06/22/2011   CHOLHDL 4 06/22/2011  He will remain on the meds as listed.

## 2011-12-10 NOTE — Assessment & Plan Note (Signed)
This is baseline.  He will continue the meds as listed and compression stockings.

## 2011-12-10 NOTE — Progress Notes (Signed)
icd check in clinic  

## 2011-12-10 NOTE — Progress Notes (Signed)
HPI The patient presents for followup of his coronary and cardiomyopathy.  Since I last saw him he continues denies any new symptoms.  Since I last saw him he has done well. He's had continued lower externally swelling. He's not having any chest pressure, neck or arm discomfort. He doesn't have any palpitations, presyncope or syncope. He is walking about 15 minutes every day. Of note in January he had a stress study demonstrated an EF 39% with a fixed inferior and apical infarct with no ischemia.  Allergies  Allergen Reactions  . Sulfa Antibiotics     Current Outpatient Prescriptions  Medication Sig Dispense Refill  . ACCU-CHEK AVIVA PLUS test strip       . carbonyl iron ([facility] IRON) 45 MG TABS Take 45 mg by mouth daily.        . fexofenadine (ALLEGRA) 180 MG tablet Take 180 mg by mouth daily.        . fluticasone (FLONASE) 50 MCG/ACT nasal spray 2 sprays by Nasal route daily.        . furosemide (LASIX) 40 MG tablet Take 2 tablets by mouth Daily.       Marland Kitchen glimepiride (AMARYL) 2 MG tablet Take 2 tablets by mouth Daily.       Marland Kitchen levothyroxine (SYNTHROID, LEVOTHROID) 150 MCG tablet Take 1 tablet by mouth Daily.      . metoprolol (TOPROL-XL) 200 MG 24 hr tablet Take 1 tablet by mouth Daily.      . Multiple Vitamin (MULTIVITAMIN) capsule Take 1 capsule by mouth daily.        Marland Kitchen olmesartan (BENICAR) 20 MG tablet Take 20 mg by mouth daily.        Marland Kitchen omeprazole (PRILOSEC) 20 MG capsule Take 1 tablet by mouth Daily.      Marland Kitchen QVAR 80 MCG/ACT inhaler Take 2 puffs by mouth at bedtime.      Marland Kitchen atorvastatin (LIPITOR) 40 MG tablet Take 1 tablet by mouth Daily.        Past Medical History  Diagnosis Date  . Atrial flutter   . DM2 (diabetes mellitus, type 2)   . HYPERTENSION   . CHF CONGESTIVE HEART FAILURE     EF 35%  Echo 2010  . Edema   . WEIGHT GAIN, ABNORMAL   . DYSPNEA   . AUTOMATIC IMPLANTABLE CARDIAC DEFIBRILLATOR SITU   . DYSLIPIDEMIA   . HYPOTHYROIDISM   . MYOCARDIAL INFARCTION     Past  Surgical History  Procedure Date  . Cardiac defibrillator placement   . Angioplasty     stent  . Appendectomy 1957  . Eye surgery 1985  . Carotid stent    ROS:  As stated in the HPI and negative for all other systems.  PHYSICAL EXAM BP 102/50  Pulse 50  Ht 5\' 9"  (1.753 m)  Wt 196 lb 1.9 oz (88.959 kg)  BMI 28.96 kg/m2 GENERAL:  Well appearing HEENT:  Pupils equal round and reactive, fundi not visualized, oral mucosa unremarkable NECK:  No jugular venous distention, waveform within normal limits, carotid upstroke brisk and symmetric, no bruits, no thyromegaly LYMPHATICS:  No cervical, inguinal adenopathy LUNGS:  Clear to auscultation bilaterally BACK:  No CVA tenderness CHEST:  ICD HEART:  PMI not displaced or sustained,S1 and S2 within normal limits, no S3, no S4, no clicks, no rubs, no murmurs ABD:  Flat, positive bowel sounds normal in frequency in pitch, no bruits, no rebound, no guarding, no midline pulsatile mass, no hepatomegaly, no  splenomegaly EXT:  2 plus pulses upper with diminished DP/PT lower, moderate left greater than right edema, no cyanosis no clubbing SKIN:  No rashes no nodules NEURO:  Cranial nerves II through XII grossly intact, motor grossly intact throughout PSYCH:  Cognitively intact, oriented to person place and time  EKG:  Sinus bradycardia, rate 50, left bundle branch block no change from previous.  12/10/2011   ASSESSMENT AND PLAN

## 2011-12-10 NOTE — Assessment & Plan Note (Signed)
He has a known prior infarct with fixed defect on perfusion imaging January 2012. He has no new symptoms. No further cardiovascular testing is suggested. We will continue with risk reduction.

## 2011-12-10 NOTE — Patient Instructions (Addendum)
The current medical regimen is effective;  continue present plan and medications.  Follow up with Dr Ladona Ridgel in 3 months and with Dr Antoine Poche in 9 months.  Please call if you need anything or have questions!!  Avie Arenas, RN

## 2011-12-10 NOTE — Assessment & Plan Note (Signed)
This was interrogated and functioning normally. He's had no device firings. He will continue with meds as listed.

## 2012-03-08 ENCOUNTER — Encounter: Payer: Self-pay | Admitting: *Deleted

## 2012-03-15 ENCOUNTER — Ambulatory Visit (INDEPENDENT_AMBULATORY_CARE_PROVIDER_SITE_OTHER): Payer: Medicare Other | Admitting: Internal Medicine

## 2012-03-15 ENCOUNTER — Encounter: Payer: Self-pay | Admitting: Internal Medicine

## 2012-03-15 VITALS — BP 122/72 | HR 54 | Ht 69.0 in | Wt 193.4 lb

## 2012-03-15 DIAGNOSIS — Z95810 Presence of automatic (implantable) cardiac defibrillator: Secondary | ICD-10-CM

## 2012-03-15 DIAGNOSIS — I509 Heart failure, unspecified: Secondary | ICD-10-CM

## 2012-03-15 DIAGNOSIS — I2510 Atherosclerotic heart disease of native coronary artery without angina pectoris: Secondary | ICD-10-CM

## 2012-03-15 LAB — ICD DEVICE OBSERVATION
CHARGE TIME: 9.3 s
RV LEAD AMPLITUDE: 11.9 mv
RV LEAD IMPEDENCE ICD: 480 Ohm
TZAT-0001FASTVT: 1
TZAT-0018FASTVT: NEGATIVE
TZAT-0018FASTVT: NEGATIVE
TZST-0001FASTVT: 3
TZST-0001FASTVT: 5
TZST-0001FASTVT: 8
TZST-0003FASTVT: 41 J
TZST-0003FASTVT: 41 J
TZST-0003FASTVT: 41 J
VENTRICULAR PACING ICD: 1 pct

## 2012-03-15 NOTE — Patient Instructions (Signed)
Your physician wants you to follow-up in: 12 months with Dr Stevan Born will receive a reminder letter in the mail two months in advance. If you don't receive a letter, please call our office to schedule the follow-up appointment.   Remote monitoring is used to monitor your Pacemaker of ICD from home. This monitoring reduces the number of office visits required to check your device to one time per year. It allows [place] to keep an eye on the functioning of your device to ensure it is working properly. You are scheduled for a device check from home on 06/23/12. You may send your transmission at any time that day. If you have a wireless device, the transmission will be sent automatically. After your physician reviews your transmission, you will receive a postcard with your next transmission date.

## 2012-03-16 ENCOUNTER — Encounter: Payer: Self-pay | Admitting: Internal Medicine

## 2012-03-16 NOTE — Assessment & Plan Note (Signed)
His chronic systolic heart failure is well compensated and class II. He is instructed to reduce his salt intake and continue his current medical therapy.

## 2012-03-16 NOTE — Progress Notes (Signed)
HPI [name] returns today for followup. He is a very pleasant 70 show man with a long-standing nonischemic cardiomyopathy, chronic systolic heart failure, dyslipidemia, status post ICD implantation. In the interim, he has been stable. He denies chest pain. He has minimal peripheral edema. The patient denies any ICD shocks. He has class II heart failure symptoms. Allergies  Allergen Reactions  . Sulfa Antibiotics      Current Outpatient Prescriptions  Medication Sig Dispense Refill  . ACCU-CHEK AVIVA PLUS test strip       . atorvastatin (LIPITOR) 40 MG tablet Take 1 tablet by mouth Daily.      . carbonyl iron ([facility] IRON) 45 MG TABS Take 45 mg by mouth daily.        . fexofenadine (ALLEGRA) 180 MG tablet Take 180 mg by mouth daily.        . fluticasone (FLONASE) 50 MCG/ACT nasal spray 2 sprays by Nasal route daily.        . furosemide (LASIX) 40 MG tablet Take 2 tablets by mouth Daily.       Marland Kitchen glimepiride (AMARYL) 2 MG tablet Take 2 tablets by mouth Daily.       Marland Kitchen levothyroxine (SYNTHROID, LEVOTHROID) 150 MCG tablet Take 1 tablet by mouth Daily.      . metoprolol (TOPROL-XL) 200 MG 24 hr tablet Take 1 tablet by mouth Daily.      . Multiple Vitamin (MULTIVITAMIN) capsule Take 1 capsule by mouth daily.        Marland Kitchen olmesartan (BENICAR) 20 MG tablet Take 20 mg by mouth daily.        Marland Kitchen omeprazole (PRILOSEC) 20 MG capsule Take 1 tablet by mouth Daily.      Marland Kitchen QVAR 80 MCG/ACT inhaler Take 2 puffs by mouth at bedtime.         Past Medical History  Diagnosis Date  . Atrial flutter   . DM2 (diabetes mellitus, type 2)   . HYPERTENSION   . CHF CONGESTIVE HEART FAILURE     EF 35%  Echo 2010  . Edema   . WEIGHT GAIN, ABNORMAL   . DYSPNEA   . AUTOMATIC IMPLANTABLE CARDIAC DEFIBRILLATOR SITU   . DYSLIPIDEMIA   . HYPOTHYROIDISM   . MYOCARDIAL INFARCTION     ROS:   All systems reviewed and negative except as noted in the HPI.   Past Surgical History  Procedure Date  . Cardiac  defibrillator placement   . Angioplasty     stent  . Appendectomy 1957  . Eye surgery 1985  . Carotid stent      Family History  Problem Relation Age of Onset  . Other Mother     natural causes  . Kidney disease Father   . Cancer Brother     throat cancer     History   Social History  . Marital Status: Married    Spouse Name: N/A    Number of Children: N/A  . Years of Education: N/A   Occupational History  . Not on file.   Social History Main Topics  . Smoking status: Former Smoker    Quit date: 12/10/1983  . Smokeless tobacco: Not on file   Comment: quit 1985  . Alcohol Use: No  . Drug Use: No  . Sexually Active: Not on file   Other Topics Concern  . Not on file   Social History Narrative  . No narrative on file     BP 122/72  Pulse 54  Ht 5\' 9"  (1.753 m)  Wt 193 lb 6.4 oz (87.726 kg)  BMI 28.56 kg/m2  Physical Exam:  Well appearing elderly man, NAD HEENT: Unremarkable Neck:  7 cm JVD, no thyromegally Lungs:  Clear with no wheezes, rales, or rhonchi. HEART:  Regular rate rhythm, no murmurs, no rubs, no clicks Abd:  soft, positive bowel sounds, no organomegally, no rebound, no guarding Ext:  2 plus pulses, 1+ edema, no cyanosis, no clubbing Skin:  No rashes no nodules Neuro:  CN II through XII intact, motor grossly intact  DEVICE  Normal device function.  See PaceArt for details.   Assess/Plan:

## 2012-03-16 NOTE — Assessment & Plan Note (Signed)
His [employer] ICD is working normally. We'll plan to recheck in several months. 

## 2012-03-29 ENCOUNTER — Encounter: Payer: Self-pay | Admitting: Internal Medicine

## 2012-06-23 ENCOUNTER — Encounter: Payer: Medicare Other | Admitting: *Deleted

## 2012-07-15 ENCOUNTER — Encounter: Payer: Self-pay | Admitting: *Deleted

## 2012-12-15 ENCOUNTER — Ambulatory Visit: Payer: Medicare Other | Admitting: Cardiology

## 2013-02-01 ENCOUNTER — Encounter: Payer: Self-pay | Admitting: Cardiology

## 2013-02-16 ENCOUNTER — Ambulatory Visit: Payer: Medicare Other | Admitting: Cardiology

## 2013-02-23 ENCOUNTER — Encounter: Payer: Self-pay | Admitting: Cardiology

## 2013-02-23 ENCOUNTER — Ambulatory Visit (INDEPENDENT_AMBULATORY_CARE_PROVIDER_SITE_OTHER): Payer: Medicare Other | Admitting: Cardiology

## 2013-02-23 VITALS — BP 113/52 | HR 55 | Ht 69.0 in | Wt 183.0 lb

## 2013-02-23 DIAGNOSIS — I2510 Atherosclerotic heart disease of native coronary artery without angina pectoris: Secondary | ICD-10-CM

## 2013-02-23 NOTE — Progress Notes (Signed)
HPI The patient presents for followup of his coronary and cardiomyopathy.  Since I last saw him he continues denies any new symptoms.  Since I last saw him he has done well. He's not having any chest pressure, neck or arm discomfort. He he is very active still working. He says with this level of activity he's had no symptoms. He denies any chest pressure, neck or arm discomfort. He's had no palpitations, presyncope or syncope. He has had no PND or orthopnea.  Of note he has lost about 20 pounds over several years.  Allergies  Allergen Reactions  . Sulfa Antibiotics   . Prednisone Rash    Current Outpatient Prescriptions  Medication Sig Dispense Refill  . ACCU-CHEK AVIVA PLUS test strip       . atorvastatin (LIPITOR) 40 MG tablet Take 1 tablet by mouth Daily.      . carbonyl iron ([facility] IRON) 45 MG TABS Take 45 mg by mouth daily.        . fexofenadine (ALLEGRA) 180 MG tablet Take 180 mg by mouth daily.        . fluticasone (FLONASE) 50 MCG/ACT nasal spray 2 sprays by Nasal route daily.        . furosemide (LASIX) 40 MG tablet Take 2 tablets by mouth 2 (two) times daily.       Marland Kitchen glimepiride (AMARYL) 2 MG tablet Take 2 tablets by mouth Daily.       Marland Kitchen levothyroxine (SYNTHROID, LEVOTHROID) 150 MCG tablet Take 1 tablet by mouth Daily.      Marland Kitchen losartan (COZAAR) 50 MG tablet Take 50 mg by mouth daily.       . metoprolol (TOPROL-XL) 200 MG 24 hr tablet Take 1 tablet by mouth Daily.      . Multiple Vitamin (MULTIVITAMIN) capsule Take 1 capsule by mouth daily.        Marland Kitchen omeprazole (PRILOSEC) 20 MG capsule Take 1 tablet by mouth Daily.      Marland Kitchen QVAR 80 MCG/ACT inhaler Take 2 puffs by mouth at bedtime.       No current facility-administered medications for this visit.    Past Medical History  Diagnosis Date  . Atrial flutter   . DM2 (diabetes mellitus, type 2)   . HYPERTENSION   . CHF CONGESTIVE HEART FAILURE     EF 35%  Echo 2010  . Edema   . WEIGHT GAIN, ABNORMAL   . DYSPNEA   . AUTOMATIC  IMPLANTABLE CARDIAC DEFIBRILLATOR SITU   . DYSLIPIDEMIA   . HYPOTHYROIDISM   . MYOCARDIAL INFARCTION     Past Surgical History  Procedure Laterality Date  . Cardiac defibrillator placement    . Angioplasty      stent  . Appendectomy  1957  . Eye surgery  1985  . Carotid stent     ROS:  As stated in the HPI and negative for all other systems.  PHYSICAL EXAM BP 113/52  Pulse 55  Ht 5\' 9"  (1.753 m)  Wt 183 lb (83.008 kg)  BMI 27.01 kg/m2 GENERAL:  Well appearing HEENT:  Pupils equal round and reactive, fundi not visualized, oral mucosa unremarkable NECK:  No jugular venous distention, waveform within normal limits, carotid upstroke brisk and symmetric, no bruits, no thyromegaly LYMPHATICS:  No cervical, inguinal adenopathy LUNGS:  Clear to auscultation bilaterally BACK:  No CVA tenderness CHEST:  ICD HEART:  PMI not displaced or sustained,S1 and S2 within normal limits, no S3, no S4, no clicks, no  rubs, no murmurs ABD:  Flat, positive bowel sounds normal in frequency in pitch, no bruits, no rebound, no guarding, no midline pulsatile mass, no hepatomegaly, no splenomegaly EXT:  2 plus pulses upper with diminished DP/PT lower, moderate left greater than right edema, no cyanosis no clubbing SKIN:  No rashes no nodules NEURO:  Cranial nerves II through XII grossly intact, motor grossly intact throughout PSYCH:  Cognitively intact, oriented to person place and time  EKG:  Sinus bradycardia, rate 52, left bundle branch block no change from previous.  02/23/2013   ASSESSMENT AND PLAN   CAD:  The patient has no new sypmtoms.  No further cardiovascular testing is indicated.  We will continue with aggressive risk reduction and meds as listed.  CAROTID STENOSIS:  He is to have Carotid Doppler in January.  HTN:  The blood pressure is at target. No change in medications is indicated. We will continue with therapeutic lifestyle changes (TLC).  DYSLIPIDEMIA:  His LDL recently was 62  with an HDL of 38. This is excellent. No change in therapy is indicated.

## 2013-02-23 NOTE — Patient Instructions (Addendum)
The current medical regimen is effective;  continue present plan and medications.  Follow up in 1 year with Dr Hochrein.  You will receive a letter in the mail 2 months before you are due.  Please call us when you receive this letter to schedule your follow up appointment.  

## 2013-03-16 ENCOUNTER — Encounter: Payer: Self-pay | Admitting: Internal Medicine

## 2013-03-16 ENCOUNTER — Ambulatory Visit (INDEPENDENT_AMBULATORY_CARE_PROVIDER_SITE_OTHER): Payer: Medicare Other | Admitting: Internal Medicine

## 2013-03-16 VITALS — BP 123/63 | HR 55 | Ht 69.0 in | Wt 184.6 lb

## 2013-03-16 DIAGNOSIS — I2510 Atherosclerotic heart disease of native coronary artery without angina pectoris: Secondary | ICD-10-CM

## 2013-03-16 DIAGNOSIS — I509 Heart failure, unspecified: Secondary | ICD-10-CM

## 2013-03-16 DIAGNOSIS — Z95810 Presence of automatic (implantable) cardiac defibrillator: Secondary | ICD-10-CM

## 2013-03-16 LAB — ICD DEVICE OBSERVATION
DEVICE MODEL ICD: 114258
HV IMPEDENCE: 72 Ohm
RV LEAD AMPLITUDE: 12.1 mv
RV LEAD IMPEDENCE ICD: 494 Ohm
RV LEAD THRESHOLD: 1.7 V
TZAT-0001FASTVT: 1
TZAT-0013FASTVT: 2
TZON-0003FASTVT: 352.9 ms
TZON-0004FASTVT: 5
TZST-0001FASTVT: 3
TZST-0001FASTVT: 6
TZST-0001FASTVT: 7
TZST-0001FASTVT: 8
TZST-0003FASTVT: 41 J
TZST-0003FASTVT: 41 J
VENTRICULAR PACING ICD: 1 pct

## 2013-03-16 NOTE — Assessment & Plan Note (Signed)
His chronic systolic heart failure is class II and well compensated. He will continue his current medical therapy, and maintain a low-sodium diet.

## 2013-03-16 NOTE — Progress Notes (Signed)
HPI Mr. Fritchman returns today for followup. He is a very pleasant 79 year old man with chronic systolic heart failure, class II, and ischemic cardiomyopathy status post MI, status post ICD implantation. In the interim, the patient has done well. Previously, he had difficulty with obesity. He has lost 20 pounds in the last year. He states that he is exercising and dieting. He denies chest pain, shortness of breath, or syncope. He has chronic well compensated peripheral edema. Allergies  Allergen Reactions  . Sulfa Antibiotics   . Prednisone Rash     Current Outpatient Prescriptions  Medication Sig Dispense Refill  . ACCU-CHEK AVIVA PLUS test strip       . atorvastatin (LIPITOR) 40 MG tablet Take 1 tablet by mouth Daily.      . carbonyl iron ([facility] IRON) 45 MG TABS Take 45 mg by mouth daily.        . fexofenadine (ALLEGRA) 180 MG tablet Take 180 mg by mouth daily.        . fluticasone (FLONASE) 50 MCG/ACT nasal spray 2 sprays by Nasal route daily.        . furosemide (LASIX) 40 MG tablet Take 40 mg by mouth 4 (four) times daily.       Marland Kitchen glimepiride (AMARYL) 2 MG tablet Take 2 tablets by mouth Daily.       Marland Kitchen levothyroxine (SYNTHROID, LEVOTHROID) 150 MCG tablet Take 1 tablet by mouth Daily.      Marland Kitchen losartan (COZAAR) 50 MG tablet Take 50 mg by mouth daily.       . metoprolol (TOPROL-XL) 200 MG 24 hr tablet Take 1 tablet by mouth Daily.      . Multiple Vitamin (MULTIVITAMIN) capsule Take 1 capsule by mouth daily.        Marland Kitchen omeprazole (PRILOSEC) 20 MG capsule Take 1 tablet by mouth Daily.      Marland Kitchen QVAR 80 MCG/ACT inhaler Take 2 puffs by mouth at bedtime.       No current facility-administered medications for this visit.     Past Medical History  Diagnosis Date  . Atrial flutter   . DM2 (diabetes mellitus, type 2)   . HYPERTENSION   . CHF CONGESTIVE HEART FAILURE     EF 35%  Echo 2010  . Edema   . WEIGHT GAIN, ABNORMAL   . DYSPNEA   . AUTOMATIC IMPLANTABLE CARDIAC DEFIBRILLATOR  SITU   . DYSLIPIDEMIA   . HYPOTHYROIDISM   . MYOCARDIAL INFARCTION     ROS:   All systems reviewed and negative except as noted in the HPI.   Past Surgical History  Procedure Laterality Date  . Cardiac defibrillator placement    . Angioplasty      stent  . Appendectomy  1957  . Eye surgery  1985  . Carotid stent       Family History  Problem Relation Age of Onset  . Other Mother     natural causes  . Kidney disease Father   . Cancer Brother     throat cancer     History   Social History  . Marital Status: Married    Spouse Name: N/A    Number of Children: N/A  . Years of Education: N/A   Occupational History  . Not on file.   Social History Main Topics  . Smoking status: Former Smoker    Quit date: 12/10/1983  . Smokeless tobacco: Not on file     Comment: quit 1985  .  Alcohol Use: No  . Drug Use: No  . Sexual Activity: Not on file   Other Topics Concern  . Not on file   Social History Narrative  . No narrative on file     BP 123/63  Pulse 55  Ht 5\' 9"  (1.753 m)  Wt 184 lb 9.6 oz (83.734 kg)  BMI 27.25 kg/m2  Physical Exam:  Well appearing 79 year old man,NAD HEENT: Unremarkable Neck:  7 cm JVD, no thyromegally Back:  No CVA tenderness Lungs:  Clear with no wheezes, rales, or rhonchi. HEART:  Regular rate rhythm, no murmurs, no rubs, no clicks Abd:  soft, positive bowel sounds, no organomegally, no rebound, no guarding Ext:  2 plus pulses, no edema, no cyanosis, no clubbing Skin:  No rashes no nodules Neuro:  CN II through XII intact, motor grossly intact  DEVICE  Normal device function.  See PaceArt for details.   Assess/Plan:

## 2013-03-16 NOTE — Patient Instructions (Signed)
Remote monitoring is used to monitor your Pacemaker of ICD from home. This monitoring reduces the number of office visits required to check your device to one time per year. It allows us to keep an eye on the functioning of your device to ensure it is working properly. You are scheduled for a device check from home on 06/16/2013. You may send your transmission at any time that day. If you have a wireless device, the transmission will be sent automatically. After your physician reviews your transmission, you will receive a postcard with your next transmission date.  Your physician wants you to follow-up in: one year with Dr. Taylor.  You will receive a reminder letter in the mail two months in advance. If you don't receive a letter, please call our office to schedule the follow-up appointment.    

## 2013-03-16 NOTE — Assessment & Plan Note (Signed)
He denies anginal symptoms. I've encouraged the patient to continue his regular daily exercise and weight loss program.

## 2013-03-16 NOTE — Assessment & Plan Note (Signed)
His [employer] single-chamber ICD is working normally. We'll plan to recheck in several months. 

## 2013-03-27 ENCOUNTER — Encounter: Payer: Self-pay | Admitting: Internal Medicine

## 2013-06-14 ENCOUNTER — Other Ambulatory Visit (HOSPITAL_COMMUNITY): Payer: Self-pay | Admitting: Cardiology

## 2013-06-14 DIAGNOSIS — I6529 Occlusion and stenosis of unspecified carotid artery: Secondary | ICD-10-CM

## 2013-06-16 ENCOUNTER — Encounter: Payer: Medicare Other | Admitting: *Deleted

## 2013-06-19 ENCOUNTER — Encounter: Payer: Self-pay | Admitting: *Deleted

## 2013-06-22 ENCOUNTER — Ambulatory Visit (HOSPITAL_COMMUNITY): Payer: Medicare Other | Attending: Cardiovascular Disease

## 2013-06-22 ENCOUNTER — Encounter: Payer: Self-pay | Admitting: Cardiovascular Disease

## 2013-06-22 DIAGNOSIS — E785 Hyperlipidemia, unspecified: Secondary | ICD-10-CM | POA: Insufficient documentation

## 2013-06-22 DIAGNOSIS — I10 Essential (primary) hypertension: Secondary | ICD-10-CM | POA: Insufficient documentation

## 2013-06-22 DIAGNOSIS — I2510 Atherosclerotic heart disease of native coronary artery without angina pectoris: Secondary | ICD-10-CM | POA: Insufficient documentation

## 2013-06-22 DIAGNOSIS — I658 Occlusion and stenosis of other precerebral arteries: Secondary | ICD-10-CM | POA: Insufficient documentation

## 2013-06-22 DIAGNOSIS — J449 Chronic obstructive pulmonary disease, unspecified: Secondary | ICD-10-CM | POA: Insufficient documentation

## 2013-06-22 DIAGNOSIS — E119 Type 2 diabetes mellitus without complications: Secondary | ICD-10-CM | POA: Insufficient documentation

## 2013-06-22 DIAGNOSIS — J4489 Other specified chronic obstructive pulmonary disease: Secondary | ICD-10-CM | POA: Insufficient documentation

## 2013-06-22 DIAGNOSIS — I6529 Occlusion and stenosis of unspecified carotid artery: Secondary | ICD-10-CM | POA: Insufficient documentation

## 2013-06-22 DIAGNOSIS — Z87891 Personal history of nicotine dependence: Secondary | ICD-10-CM | POA: Insufficient documentation

## 2013-09-18 ENCOUNTER — Emergency Department (HOSPITAL_COMMUNITY): Payer: Medicare Other

## 2013-09-18 ENCOUNTER — Emergency Department (HOSPITAL_COMMUNITY)
Admission: EM | Admit: 2013-09-18 | Discharge: 2013-09-18 | Disposition: A | Discharge status: Home / Self Care | Payer: Medicare Other | Attending: Emergency Medicine | Admitting: Emergency Medicine

## 2013-09-18 ENCOUNTER — Encounter (HOSPITAL_COMMUNITY): Payer: Self-pay | Admitting: Emergency Medicine

## 2013-09-18 DIAGNOSIS — R209 Unspecified disturbances of skin sensation: Secondary | ICD-10-CM | POA: Insufficient documentation

## 2013-09-18 DIAGNOSIS — E785 Hyperlipidemia, unspecified: Secondary | ICD-10-CM | POA: Insufficient documentation

## 2013-09-18 DIAGNOSIS — Z87891 Personal history of nicotine dependence: Secondary | ICD-10-CM | POA: Insufficient documentation

## 2013-09-18 DIAGNOSIS — Z7982 Long term (current) use of aspirin: Secondary | ICD-10-CM | POA: Insufficient documentation

## 2013-09-18 DIAGNOSIS — I10 Essential (primary) hypertension: Secondary | ICD-10-CM | POA: Insufficient documentation

## 2013-09-18 DIAGNOSIS — I509 Heart failure, unspecified: Secondary | ICD-10-CM | POA: Insufficient documentation

## 2013-09-18 DIAGNOSIS — M5412 Radiculopathy, cervical region: Secondary | ICD-10-CM | POA: Insufficient documentation

## 2013-09-18 DIAGNOSIS — I252 Old myocardial infarction: Secondary | ICD-10-CM | POA: Insufficient documentation

## 2013-09-18 DIAGNOSIS — Z79899 Other long term (current) drug therapy: Secondary | ICD-10-CM | POA: Insufficient documentation

## 2013-09-18 DIAGNOSIS — IMO0002 Reserved for concepts with insufficient information to code with codable children: Secondary | ICD-10-CM | POA: Insufficient documentation

## 2013-09-18 DIAGNOSIS — Z95810 Presence of automatic (implantable) cardiac defibrillator: Secondary | ICD-10-CM | POA: Insufficient documentation

## 2013-09-18 DIAGNOSIS — E039 Hypothyroidism, unspecified: Secondary | ICD-10-CM | POA: Insufficient documentation

## 2013-09-18 DIAGNOSIS — E119 Type 2 diabetes mellitus without complications: Secondary | ICD-10-CM | POA: Insufficient documentation

## 2013-09-18 LAB — CBC
HEMATOCRIT: 39.8 % (ref 39.0–52.0)
HEMOGLOBIN: 13.7 g/dL (ref 13.0–17.0)
MCH: 33.3 pg (ref 26.0–34.0)
MCHC: 34.4 g/dL (ref 30.0–36.0)
MCV: 96.8 fL (ref 78.0–100.0)
Platelets: 144 10*3/uL — ABNORMAL LOW (ref 150–400)
RBC: 4.11 MIL/uL — AB (ref 4.22–5.81)
RDW: 15.1 % (ref 11.5–15.5)
WBC: 7.2 10*3/uL (ref 4.0–10.5)

## 2013-09-18 LAB — I-STAT TROPONIN, ED: TROPONIN I, POC: 0.01 ng/mL (ref 0.00–0.08)

## 2013-09-18 LAB — BASIC METABOLIC PANEL
BUN: 18 mg/dL (ref 6–23)
CHLORIDE: 101 meq/L (ref 96–112)
CO2: 25 meq/L (ref 19–32)
CREATININE: 1.3 mg/dL (ref 0.50–1.35)
Calcium: 9.2 mg/dL (ref 8.4–10.5)
GFR calc non Af Amer: 51 mL/min — ABNORMAL LOW (ref >90)
GFR, EST AFRICAN AMERICAN: 59 mL/min — AB (ref >90)
Glucose, Bld: 108 mg/dL — ABNORMAL HIGH (ref 70–99)
POTASSIUM: 5.3 meq/L (ref 3.7–5.3)
Sodium: 137 mEq/L (ref 137–147)

## 2013-09-18 NOTE — Discharge Instructions (Signed)
Radicular Pain Radicular pain in either the arm or leg is usually from a bulging or herniated disk in the spine. A piece of the herniated disk may press against the nerves as the nerves exit the spine. This causes pain which is felt at the tips of the nerves down the arm or leg. Other causes of radicular pain may include:  Fractures.  Heart disease.  Cancer.  An abnormal and usually degenerative state of the nervous system or nerves (neuropathy). Diagnosis may require CT or MRI scanning to determine the primary cause.  Nerves that start at the neck (nerve roots) may cause radicular pain in the outer shoulder and arm. It can spread down to the thumb and fingers. The symptoms vary depending on which nerve root has been affected. In most cases radicular pain improves with conservative treatment. Neck problems may require physical therapy, a neck collar, or cervical traction. Treatment may take many weeks, and surgery may be considered if the symptoms do not improve.  Conservative treatment is also recommended for sciatica. Sciatica causes pain to radiate from the lower back or buttock area down the leg into the foot. Often there is a history of back problems. Most patients with sciatica are better after 2 to 4 weeks of rest and other supportive care. Short term bed rest can reduce the disk pressure considerably. Sitting, however, is not a good position since this increases the pressure on the disk. You should avoid bending, lifting, and all other activities which make the problem worse. Traction can be used in severe cases. Surgery is usually reserved for patients who do not improve within the first months of treatment. Only take over-the-counter or prescription medicines for pain, discomfort, or fever as directed by your caregiver. Narcotics and muscle relaxants may help by relieving more severe pain and spasm and by providing mild sedation. Cold or massage can give significant relief. Spinal manipulation  is not recommended. It can increase the degree of disc protrusion. Epidural steroid injections are often effective treatment for radicular pain. These injections deliver medicine to the spinal nerve in the space between the protective covering of the spinal cord and back bones (vertebrae). Your caregiver can give you more information about steroid injections. These injections are most effective when given within two weeks of the onset of pain.  You should see your caregiver for follow up care as recommended. A program for neck and back injury rehabilitation with stretching and strengthening exercises is an important part of management.  SEEK IMMEDIATE MEDICAL CARE IF:  You develop increased pain, weakness, or numbness in your arm or leg.  You develop difficulty with bladder or bowel control.  You develop abdominal pain. Document Released: 06/25/2004 Document Revised: 08/10/2011 Document Reviewed: 09/10/2008 [employer] Patient Information 2014 [employer].  

## 2013-09-18 NOTE — ED Notes (Signed)
Per pt sts sudden onset of tingling in both arms and upper body today. 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 Denies chest pain or SOB.

## 2013-09-19 NOTE — ED Provider Notes (Signed)
CSN: [id number]     Arrival date & time 09/18/13  1840 History   First MD Initiated Contact with Patient 09/18/13 1954     Chief Complaint  Patient presents with  . Tingling  . 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  This is not a process he does frequently.  He states that several minutes after stopping the tingling went away.  He denies neck pain.  No chest pain or jaw pain or shortness of breath.  No history of known cervical disc issues.  Patient reports never having weakness of his arms or legs.  No recent injury or trauma.  No fevers or chills.  Again denies neck pain.  Currently he is asymptomatic   Past Medical History  Diagnosis Date  . Atrial flutter   . DM2 (diabetes mellitus, type 2)   . HYPERTENSION   . CHF CONGESTIVE HEART FAILURE     EF 35%  Echo 2010  . Edema   . WEIGHT GAIN, ABNORMAL   . DYSPNEA   . AUTOMATIC IMPLANTABLE CARDIAC DEFIBRILLATOR SITU   . DYSLIPIDEMIA   . HYPOTHYROIDISM   . MYOCARDIAL INFARCTION    Past Surgical History  Procedure Laterality Date  . Cardiac defibrillator placement    . Angioplasty      stent  . Appendectomy  1957  . Eye surgery  1985  . Carotid stent     Family History  Problem Relation Age of Onset  . Other Mother     natural causes  . Kidney disease Father   . Cancer Brother     throat cancer   History  Substance Use Topics  . Smoking status: Former Smoker    Quit date: 12/10/1983  . Smokeless tobacco: Not on file     Comment: quit 1985  . Alcohol Use: No    Review of Systems  All other systems reviewed and are negative.     Allergies  Sulfa antibiotics and Prednisone  Home Medications   Prior to Admission medications   Medication Sig Start Date End Date Taking? Authorizing Provider  aspirin 81 MG tablet Take 81 mg by mouth daily.   Yes Historical Provider, MD  atorvastatin (LIPITOR) 40 MG tablet Take 1  tablet by mouth Daily. 08/25/10  Yes Historical Provider, MD  carbonyl iron ([facility] IRON) 45 MG TABS Take 45 mg by mouth daily.     Yes Historical Provider, MD  fexofenadine (ALLEGRA) 180 MG tablet Take 180 mg by mouth daily.     Yes Historical Provider, MD  fluticasone (FLONASE) 50 MCG/ACT nasal spray 2 sprays by Nasal route daily.     Yes Historical Provider, MD  furosemide (LASIX) 40 MG tablet Take 40 mg by mouth 4 (four) times daily.  09/15/10  Yes Historical Provider, MD  glimepiride (AMARYL) 2 MG tablet Take 2 tablets by mouth Daily.  09/15/10  Yes Historical Provider, MD  levothyroxine (SYNTHROID, LEVOTHROID) 175 MCG tablet Take 175 mcg by mouth daily before breakfast.   Yes Historical Provider, MD  losartan (COZAAR) 50 MG tablet Take 50 mg by mouth daily.  01/24/13  Yes Historical Provider, MD  metoprolol (TOPROL-XL) 200 MG 24 hr tablet Take 1 tablet by mouth Daily. 08/25/10  Yes Historical Provider, MD  Multiple Vitamin (MULTIVITAMIN) capsule Take 1 capsule by mouth daily.     Yes Historical Provider, MD  omeprazole (PRILOSEC) 20 MG capsule Take  1 tablet by mouth Daily. 09/08/10  Yes Historical Provider, MD  QVAR 80 MCG/ACT inhaler Take 2 puffs by mouth at bedtime. 08/25/10  Yes Historical Provider, MD  [place] test strip  07/12/10   Historical Provider, MD   BP 141/66  Pulse 48  Temp(Src) 98.1 F (36.7 C) (Oral)  Resp 12  SpO2 98% Physical Exam  Nursing note and vitals reviewed. Constitutional: He is oriented to person, place, and time. He appears well-developed and well-nourished.  HENT:  Head: Normocephalic and atraumatic.  Eyes: EOM are normal.  Neck: Normal range of motion.  No C-spine tenderness  Cardiovascular: Normal rate, regular rhythm, normal heart sounds and intact distal pulses.   Pulmonary/Chest: Effort normal and breath sounds normal. No respiratory distress.  Abdominal: Soft. He exhibits no distension. There is no tenderness.  Musculoskeletal: Normal range of  motion.  5 out of 5 strength in bilateral upper extremity major muscle groups.  Normal radial pulse bilaterally.  Neurological: He is alert and oriented to person, place, and time.  Skin: Skin is warm and dry.  Psychiatric: He has a normal mood and affect. Judgment normal.    ED Course  Procedures (including critical care time) Labs Review Labs Reviewed  CBC - Abnormal; Notable for the following:    RBC 4.11 (*)    Platelets 144 (*)    All other components within normal limits  BASIC METABOLIC PANEL - Abnormal; Notable for the following:    Glucose, Bld 108 (*)    GFR calc non Af Amer 51 (*)    GFR calc Af Amer 59 (*)    All other components within normal limits  I-STAT TROPOININ, ED    Imaging Review Dg Chest 2 View  09/18/2013   CLINICAL DATA:  Tingling in the arms and weakness.  EXAM: CHEST  2 VIEW  COMPARISON:  08/29/2010  FINDINGS: Cardiac pacemaker is unchanged in location. The heart size and mediastinal contours are within normal limits. Both lungs are clear. The visualized skeletal structures are unremarkable.  IMPRESSION: No active cardiopulmonary disease.   Electronically Signed   By: Lucienne Capers M.D.   On: 09/18/2013 21:39  I personally reviewed the imaging tests through PACS system I reviewed available ER/hospitalization records through the EMR    EKG Interpretation   Date/Time:  Monday September 18 2013 18:48:21 EDT Ventricular Rate:  49 PR Interval:  144 QRS Duration: 150 QT Interval:  486 QTC Calculation: 439 R Axis:   74 Text Interpretation:  Sinus bradycardia Left bundle branch block Abnormal  ECG No significant change was found Confirmed by Briea Mcenery  MD, Lennette Bihari ([id number])  on 09/18/2013 10:16:40 PM      MDM   Final diagnoses:  Cervical radiculopathy    I suspect the patient has some degree of cervical spondylosis and this was likely exacerbating some sort of nerve impingement given the position he was on a forklift.  He is asymptomatic at this time.  I  doubt this is a stroke.  Discharge home with PCP followup.   Hoy Morn, MD 09/19/13 [phone]

## 2013-10-31 ENCOUNTER — Encounter: Payer: Self-pay | Admitting: Cardiology

## 2013-12-04 ENCOUNTER — Encounter: Payer: Self-pay | Admitting: Internal Medicine

## 2013-12-04 ENCOUNTER — Ambulatory Visit (INDEPENDENT_AMBULATORY_CARE_PROVIDER_SITE_OTHER): Payer: Medicare Other | Admitting: *Deleted

## 2013-12-04 DIAGNOSIS — I428 Other cardiomyopathies: Secondary | ICD-10-CM

## 2013-12-04 LAB — MDC_IDC_ENUM_SESS_TYPE_REMOTE
Implantable Pulse Generator Serial Number: 114258
Lead Channel Pacing Threshold Amplitude: 1.7 V
Lead Channel Pacing Threshold Pulse Width: 1 ms
Lead Channel Setting Pacing Amplitude: 3 V
Lead Channel Setting Pacing Pulse Width: 0.4 ms
MDC IDC MSMT LEADCHNL RV SENSING INTR AMPL: 12.1 mV
MDC IDC SET ZONE DETECTION INTERVAL: 300 ms
Zone Setting Detection Interval: 352.9 ms

## 2013-12-04 NOTE — Progress Notes (Signed)
Remote ICD transmission.   

## 2013-12-10 ENCOUNTER — Other Ambulatory Visit: Payer: Self-pay | Admitting: Internal Medicine

## 2013-12-11 ENCOUNTER — Ambulatory Visit (INDEPENDENT_AMBULATORY_CARE_PROVIDER_SITE_OTHER): Payer: Medicare Other | Admitting: *Deleted

## 2013-12-11 DIAGNOSIS — I428 Other cardiomyopathies: Secondary | ICD-10-CM

## 2013-12-12 NOTE — Progress Notes (Signed)
Remote ICD transmission.   

## 2013-12-18 ENCOUNTER — Encounter: Payer: Self-pay | Admitting: Cardiology

## 2013-12-18 ENCOUNTER — Encounter: Payer: Medicare Other | Admitting: *Deleted

## 2014-02-22 ENCOUNTER — Ambulatory Visit: Payer: Medicare Other | Admitting: Cardiology

## 2014-03-14 ENCOUNTER — Ambulatory Visit (INDEPENDENT_AMBULATORY_CARE_PROVIDER_SITE_OTHER): Payer: Medicare Other | Admitting: *Deleted

## 2014-03-14 DIAGNOSIS — I472 Ventricular tachycardia, unspecified: Secondary | ICD-10-CM

## 2014-03-14 DIAGNOSIS — I429 Cardiomyopathy, unspecified: Secondary | ICD-10-CM

## 2014-03-14 LAB — MDC_IDC_ENUM_SESS_TYPE_REMOTE
Brady Statistic RV Percent Paced: 0 %
Implantable Pulse Generator Serial Number: 114258
Lead Channel Impedance Value: 450 Ohm
Lead Channel Sensing Intrinsic Amplitude: 10.7 mV

## 2014-03-14 NOTE — Progress Notes (Signed)
Remote ICD transmission.   

## 2014-03-23 ENCOUNTER — Encounter: Payer: Self-pay | Admitting: *Deleted

## 2014-04-02 ENCOUNTER — Encounter: Payer: Medicare Other | Admitting: Internal Medicine

## 2014-04-05 ENCOUNTER — Encounter: Payer: Self-pay | Admitting: Internal Medicine

## 2014-04-18 ENCOUNTER — Encounter: Payer: Self-pay | Admitting: Cardiology

## 2014-04-18 ENCOUNTER — Ambulatory Visit (INDEPENDENT_AMBULATORY_CARE_PROVIDER_SITE_OTHER): Payer: Medicare Other | Admitting: Cardiology

## 2014-04-18 VITALS — BP 118/60 | HR 45 | Ht 69.0 in | Wt 183.0 lb

## 2014-04-18 DIAGNOSIS — I2510 Atherosclerotic heart disease of native coronary artery without angina pectoris: Secondary | ICD-10-CM

## 2014-04-18 DIAGNOSIS — I255 Ischemic cardiomyopathy: Secondary | ICD-10-CM

## 2014-04-18 DIAGNOSIS — I10 Essential (primary) hypertension: Secondary | ICD-10-CM

## 2014-04-18 NOTE — Progress Notes (Signed)
HPI The patient presents for followup of his coronary disease and cardiomyopathy.  Since I last saw him he continues to deny any new symptoms.  He's not having any chest pressure, neck or arm discomfort. He he is very active still working and walks around [employer] sites. He says with this level of activity he's had no symptoms. He denies any chest pressure, neck or arm discomfort. He's had no palpitations, presyncope or syncope. He has had no PND or orthopnea.   He does have continued lower extremity swelling.  Allergies  Allergen Reactions  . Sulfa Antibiotics     Hyper   . Prednisone Rash    Current Outpatient Prescriptions  Medication Sig Dispense Refill  . ACCU-CHEK AVIVA PLUS test strip     . aspirin 81 MG tablet Take 81 mg by mouth daily.    Marland Kitchen atorvastatin (LIPITOR) 40 MG tablet Take 1 tablet by mouth Daily.    . carbonyl iron ([facility] IRON) 45 MG TABS Take 45 mg by mouth daily.      . fexofenadine (ALLEGRA) 180 MG tablet Take 180 mg by mouth daily.      . fluticasone (FLONASE) 50 MCG/ACT nasal spray 2 sprays by Nasal route daily.      . furosemide (LASIX) 40 MG tablet Take 40 mg by mouth 4 (four) times daily.     Marland Kitchen glimepiride (AMARYL) 2 MG tablet Take 2 tablets by mouth Daily.     Marland Kitchen levothyroxine (SYNTHROID, LEVOTHROID) 175 MCG tablet Take 175 mcg by mouth daily before breakfast.    . losartan (COZAAR) 50 MG tablet Take 50 mg by mouth daily.     . metoprolol (TOPROL-XL) 200 MG 24 hr tablet Take 1 tablet by mouth Daily.    . Multiple Vitamin (MULTIVITAMIN) capsule Take 1 capsule by mouth daily.      Marland Kitchen omeprazole (PRILOSEC) 20 MG capsule Take 1 tablet by mouth Daily.    Marland Kitchen QVAR 80 MCG/ACT inhaler Take 2 puffs by mouth at bedtime.     No current facility-administered medications for this visit.    Past Medical History  Diagnosis Date  . Atrial flutter   . DM2 (diabetes mellitus, type 2)   . HYPERTENSION   . CHF CONGESTIVE HEART FAILURE     EF 35%  Echo 2010  . Edema   .  WEIGHT GAIN, ABNORMAL   . DYSPNEA   . AUTOMATIC IMPLANTABLE CARDIAC DEFIBRILLATOR SITU   . DYSLIPIDEMIA   . HYPOTHYROIDISM   . MYOCARDIAL INFARCTION     Past Surgical History  Procedure Laterality Date  . Cardiac defibrillator placement    . Angioplasty      stent  . Appendectomy  1957  . Eye surgery  1985  . Carotid stent     ROS:  As stated in the HPI and negative for all other systems.  PHYSICAL EXAM BP 118/60 mmHg  Pulse 45  Ht 5\' 9"  (1.753 m)  Wt 183 lb (83.008 kg)  BMI 27.01 kg/m2 GENERAL:  Well appearing HEENT:  Pupils equal round and reactive, fundi not visualized, oral mucosa unremarkable NECK:  No jugular venous distention, waveform within normal limits, carotid upstroke brisk and symmetric, no bruits, no thyromegaly LYMPHATICS:  No cervical, inguinal adenopathy LUNGS:  Clear to auscultation bilaterally BACK:  No CVA tenderness CHEST:  ICD HEART:  PMI not displaced or sustained,S1 and S2 within normal limits, no S3, no S4, no clicks, no rubs, no murmurs ABD:  Flat, positive bowel sounds  normal in frequency in pitch, no bruits, no rebound, no guarding, no midline pulsatile mass, no hepatomegaly, no splenomegaly EXT:  2 plus pulses upper with diminished DP/PT lower, moderate left greater than right edema, no cyanosis no clubbing  EKG:  Sinus bradycardia, rate 45, left bundle branch block no change from previous.  04/18/2014   ASSESSMENT AND PLAN   CAD:  The patient has no new sypmtoms since stress testing in 2013 (large fixed scar).  No further cardiovascular testing is indicated.  We will continue with aggressive risk reduction and meds as listed.  CAROTID STENOSIS:  He did have Carotid Doppler in January of this year and he had only mild disease.  I reviewed this with him today..  I will repeat this in about two years.Marland Kitchen  HTN:  The blood pressure is at target. No change in medications is indicated. We will continue with therapeutic lifestyle changes  (TLC).  DYSLIPIDEMIA:   This is followed by Wende Neighbors, MD.  I will defer to his management.  CARDIOMYOPATHY:  It has been five years since the last echo.  I will repeat this.  BRADYCARDIA:  He tolerates this without symptoms and he will continue the meds as listed.  EDEMA:  This is at baseline.  He will continue to wear his compression stockings.

## 2014-04-18 NOTE — Patient Instructions (Signed)
Your physician recommends that you schedule a follow-up appointment in: one year with Dr. Hochrein  We are ordering an Echo for you to get done   

## 2014-04-19 ENCOUNTER — Ambulatory Visit (INDEPENDENT_AMBULATORY_CARE_PROVIDER_SITE_OTHER): Payer: Medicare Other | Admitting: Internal Medicine

## 2014-04-19 ENCOUNTER — Encounter: Payer: Self-pay | Admitting: Internal Medicine

## 2014-04-19 VITALS — BP 118/58 | HR 57 | Ht 69.0 in | Wt 183.2 lb

## 2014-04-19 DIAGNOSIS — I472 Ventricular tachycardia, unspecified: Secondary | ICD-10-CM | POA: Insufficient documentation

## 2014-04-19 DIAGNOSIS — I5022 Chronic systolic (congestive) heart failure: Secondary | ICD-10-CM

## 2014-04-19 DIAGNOSIS — Z95810 Presence of automatic (implantable) cardiac defibrillator: Secondary | ICD-10-CM

## 2014-04-19 DIAGNOSIS — I4729 Other ventricular tachycardia: Secondary | ICD-10-CM | POA: Insufficient documentation

## 2014-04-19 LAB — MDC_IDC_ENUM_SESS_TYPE_INCLINIC
Battery Remaining Longevity: 132 mo
Brady Statistic RV Percent Paced: 1 %
Date Time Interrogation Session: 20151119050000
HIGH POWER IMPEDANCE MEASURED VALUE: 53 Ohm
HighPow Impedance: 69 Ohm
Implantable Pulse Generator Serial Number: 114258
Lead Channel Impedance Value: 497 Ohm
Lead Channel Pacing Threshold Amplitude: 1.3 V
Lead Channel Pacing Threshold Pulse Width: 1 ms
Lead Channel Sensing Intrinsic Amplitude: 12.8 mV
Lead Channel Setting Pacing Amplitude: 3 V
Lead Channel Setting Pacing Pulse Width: 1 ms
Lead Channel Setting Sensing Sensitivity: 0.5 mV
MDC IDC SET ZONE DETECTION INTERVAL: 353 ms
Zone Setting Detection Interval: 300 ms

## 2014-04-19 NOTE — Progress Notes (Signed)
HPI [name] returns today for followup. He is a very pleasant 80 year old man with chronic systolic heart failure, class II, and ischemic cardiomyopathy status post MI, status post ICD implantation. In the interim, the patient has done well. Previously, he had difficulty with obesity. He has lost weight and managed to keep it off. He states that he is exercising and dieting. He denies chest pain, shortness of breath, or syncope.  Allergies  Allergen Reactions  . Sulfa Antibiotics     Hyper   . Prednisone Rash     Current Outpatient Prescriptions  Medication Sig Dispense Refill  . ACCU-CHEK AVIVA PLUS test strip     . aspirin 81 MG tablet Take 81 mg by mouth daily.    Marland Kitchen atorvastatin (LIPITOR) 40 MG tablet Take 1 tablet by mouth Daily.    . carbonyl iron ([facility] IRON) 45 MG TABS Take 45 mg by mouth daily.      . fexofenadine (ALLEGRA) 180 MG tablet Take 180 mg by mouth daily.      . fluticasone (FLONASE) 50 MCG/ACT nasal spray 2 sprays by Nasal route daily.      . furosemide (LASIX) 40 MG tablet Take 40 mg by mouth 4 (four) times daily.     Marland Kitchen glimepiride (AMARYL) 2 MG tablet Take 2 tablets by mouth Daily.     Marland Kitchen levothyroxine (SYNTHROID, LEVOTHROID) 175 MCG tablet Take 175 mcg by mouth daily before breakfast.    . losartan (COZAAR) 50 MG tablet Take 50 mg by mouth daily.     . metoprolol (TOPROL-XL) 200 MG 24 hr tablet Take 1 tablet by mouth Daily.    . Multiple Vitamin (MULTIVITAMIN) capsule Take 1 capsule by mouth daily.      Marland Kitchen omeprazole (PRILOSEC) 20 MG capsule Take 1 tablet by mouth Daily.    Marland Kitchen QVAR 80 MCG/ACT inhaler Take 2 puffs by mouth at bedtime.     No current facility-administered medications for this visit.     Past Medical History  Diagnosis Date  . Atrial flutter   . DM2 (diabetes mellitus, type 2)   . HYPERTENSION   . CHF CONGESTIVE HEART FAILURE     EF 35%  Echo 2010  . Edema   . WEIGHT GAIN, ABNORMAL   . DYSPNEA   . AUTOMATIC IMPLANTABLE CARDIAC  DEFIBRILLATOR SITU   . DYSLIPIDEMIA   . HYPOTHYROIDISM   . MYOCARDIAL INFARCTION     ROS:   All systems reviewed and negative except as noted in the HPI.   Past Surgical History  Procedure Laterality Date  . Cardiac defibrillator placement    . Angioplasty      stent  . Appendectomy  1957  . Eye surgery  1985  . Carotid stent       Family History  Problem Relation Age of Onset  . Other Mother     natural causes  . Kidney disease Father   . Cancer Brother     throat cancer     History   Social History  . Marital Status: Married    Spouse Name: N/A    Number of Children: N/A  . Years of Education: N/A   Occupational History  . Not on file.   Social History Main Topics  . Smoking status: Former Smoker    Quit date: 12/10/1983  . Smokeless tobacco: Not on file     Comment: quit 1985  . Alcohol Use: No  . Drug Use: No  .  Sexual Activity: Not on file   Other Topics Concern  . Not on file   Social History Narrative     BP 118/58 mmHg  Pulse 57  Ht 5\' 9"  (1.753 m)  Wt 183 lb 3.2 oz (83.099 kg)  BMI 27.04 kg/m2  Physical Exam:  Well appearing 80 year old man,NAD HEENT: Unremarkable Neck:  7 cm JVD, no thyromegally Back:  No CVA tenderness Lungs:  Clear with no wheezes, rales, or rhonchi. HEART:  Regular rate rhythm, no murmurs, no rubs, no clicks Abd:  soft, positive bowel sounds, no organomegally, no rebound, no guarding Ext:  2 plus pulses, no edema, no cyanosis, no clubbing Skin:  No rashes no nodules Neuro:  CN II through XII intact, motor grossly intact  DEVICE  Normal device function.  See PaceArt for details.   Assess/Plan:

## 2014-04-19 NOTE — Assessment & Plan Note (Signed)
He has had nonsustained VT in the interim, but no sustained VT, and no ICD shocks. No change in medications today.

## 2014-04-19 NOTE — Assessment & Plan Note (Signed)
His symptoms remain class IIA. He will continue his current medications. He will maintain a low-sodium diet.

## 2014-04-19 NOTE — Patient Instructions (Signed)
Your physician wants you to follow-up in: 12 months with Dr. Taylor You will receive a reminder letter in the mail two months in advance. If you don't receive a letter, please call our office to schedule the follow-up appointment.  Remote monitoring is used to monitor your Pacemaker or ICD from home. This monitoring reduces the number of office visits required to check your device to one time per year. It allows us to keep an eye on the functioning of your device to ensure it is working properly. You are scheduled for a device check from home on 07/23/14. You may send your transmission at any time that day. If you have a wireless device, the transmission will be sent automatically. After your physician reviews your transmission, you will receive a postcard with your next transmission date.     

## 2014-04-19 NOTE — Assessment & Plan Note (Signed)
His [employer] ICD is working normally. We'll plan to recheck in several months. 

## 2014-04-24 ENCOUNTER — Ambulatory Visit (HOSPITAL_COMMUNITY)
Admission: RE | Admit: 2014-04-24 | Discharge: 2014-04-24 | Disposition: A | Discharge status: Home / Self Care | Payer: Medicare Other | Source: Ambulatory Visit | Attending: Cardiovascular Disease | Admitting: Cardiovascular Disease

## 2014-04-24 ENCOUNTER — Encounter: Payer: Self-pay | Admitting: Internal Medicine

## 2014-04-24 DIAGNOSIS — I10 Essential (primary) hypertension: Secondary | ICD-10-CM | POA: Insufficient documentation

## 2014-04-24 DIAGNOSIS — I255 Ischemic cardiomyopathy: Secondary | ICD-10-CM | POA: Diagnosis not present

## 2014-04-24 DIAGNOSIS — I059 Rheumatic mitral valve disease, unspecified: Secondary | ICD-10-CM

## 2014-04-24 DIAGNOSIS — E119 Type 2 diabetes mellitus without complications: Secondary | ICD-10-CM | POA: Diagnosis not present

## 2014-04-24 DIAGNOSIS — I5022 Chronic systolic (congestive) heart failure: Secondary | ICD-10-CM

## 2014-04-24 NOTE — Progress Notes (Signed)
2D Echo Performed 04/24/2014    Marygrace Drought, RCS

## 2014-05-01 ENCOUNTER — Other Ambulatory Visit: Payer: Self-pay | Admitting: *Deleted

## 2014-05-01 DIAGNOSIS — R079 Chest pain, unspecified: Secondary | ICD-10-CM

## 2014-05-01 NOTE — Addendum Note (Signed)
Addended by: Dolan Amen on: 05/01/2014 03:56 PM   Modules accepted: Orders

## 2014-07-23 ENCOUNTER — Ambulatory Visit (INDEPENDENT_AMBULATORY_CARE_PROVIDER_SITE_OTHER): Payer: Medicare Other | Admitting: *Deleted

## 2014-07-23 DIAGNOSIS — I255 Ischemic cardiomyopathy: Secondary | ICD-10-CM

## 2014-07-23 NOTE — Progress Notes (Signed)
Remote ICD transmission.   

## 2014-07-24 LAB — MDC_IDC_ENUM_SESS_TYPE_REMOTE
Battery Remaining Longevity: 132 mo
Battery Remaining Percentage: 100 %
Brady Statistic RV Percent Paced: 1 %
HighPow Impedance: 66 Ohm
Lead Channel Impedance Value: 490 Ohm
Lead Channel Pacing Threshold Pulse Width: 1 ms
Lead Channel Setting Pacing Amplitude: 3 V
Lead Channel Setting Pacing Pulse Width: 1 ms
MDC IDC MSMT LEADCHNL RV PACING THRESHOLD AMPLITUDE: 1.3 V
MDC IDC PG SERIAL: 114258
MDC IDC SESS DTM: 20160218041000
MDC IDC SET LEADCHNL RV SENSING SENSITIVITY: 0.5 mV
MDC IDC SET ZONE DETECTION INTERVAL: 300 ms
Zone Setting Detection Interval: 353 ms

## 2014-07-26 ENCOUNTER — Other Ambulatory Visit (HOSPITAL_COMMUNITY): Payer: Self-pay | Admitting: Cardiology

## 2014-07-26 DIAGNOSIS — I6523 Occlusion and stenosis of bilateral carotid arteries: Secondary | ICD-10-CM

## 2014-08-03 ENCOUNTER — Encounter: Payer: Self-pay | Admitting: Cardiology

## 2014-08-03 ENCOUNTER — Encounter (HOSPITAL_COMMUNITY): Payer: Medicare Other

## 2014-08-03 ENCOUNTER — Ambulatory Visit (HOSPITAL_COMMUNITY): Payer: Medicare Other | Attending: Cardiology | Admitting: *Deleted

## 2014-08-03 DIAGNOSIS — I6523 Occlusion and stenosis of bilateral carotid arteries: Secondary | ICD-10-CM

## 2014-08-03 DIAGNOSIS — E785 Hyperlipidemia, unspecified: Secondary | ICD-10-CM | POA: Diagnosis not present

## 2014-08-03 DIAGNOSIS — I2510 Atherosclerotic heart disease of native coronary artery without angina pectoris: Secondary | ICD-10-CM | POA: Insufficient documentation

## 2014-08-03 DIAGNOSIS — J449 Chronic obstructive pulmonary disease, unspecified: Secondary | ICD-10-CM | POA: Insufficient documentation

## 2014-08-03 DIAGNOSIS — E119 Type 2 diabetes mellitus without complications: Secondary | ICD-10-CM | POA: Diagnosis not present

## 2014-08-03 DIAGNOSIS — I10 Essential (primary) hypertension: Secondary | ICD-10-CM | POA: Diagnosis not present

## 2014-08-03 DIAGNOSIS — Z87891 Personal history of nicotine dependence: Secondary | ICD-10-CM | POA: Diagnosis not present

## 2014-08-03 NOTE — Progress Notes (Signed)
Carotid Duplex Scan Complete

## 2014-08-07 ENCOUNTER — Encounter: Payer: Self-pay | Admitting: Internal Medicine

## 2014-08-08 DIAGNOSIS — H5203 Hypermetropia, bilateral: Secondary | ICD-10-CM | POA: Diagnosis not present

## 2014-08-30 DIAGNOSIS — Z8669 Personal history of other diseases of the nervous system and sense organs: Secondary | ICD-10-CM | POA: Diagnosis not present

## 2014-09-05 DIAGNOSIS — E119 Type 2 diabetes mellitus without complications: Secondary | ICD-10-CM | POA: Diagnosis not present

## 2014-09-05 DIAGNOSIS — J301 Allergic rhinitis due to pollen: Secondary | ICD-10-CM | POA: Diagnosis not present

## 2014-09-05 DIAGNOSIS — Z125 Encounter for screening for malignant neoplasm of prostate: Secondary | ICD-10-CM | POA: Diagnosis not present

## 2014-09-05 DIAGNOSIS — E785 Hyperlipidemia, unspecified: Secondary | ICD-10-CM | POA: Diagnosis not present

## 2014-09-05 DIAGNOSIS — E038 Other specified hypothyroidism: Secondary | ICD-10-CM | POA: Diagnosis not present

## 2014-09-05 DIAGNOSIS — I2510 Atherosclerotic heart disease of native coronary artery without angina pectoris: Secondary | ICD-10-CM | POA: Diagnosis not present

## 2014-09-05 DIAGNOSIS — N189 Chronic kidney disease, unspecified: Secondary | ICD-10-CM | POA: Diagnosis not present

## 2014-10-22 DIAGNOSIS — I255 Ischemic cardiomyopathy: Secondary | ICD-10-CM | POA: Diagnosis not present

## 2014-10-24 ENCOUNTER — Ambulatory Visit (INDEPENDENT_AMBULATORY_CARE_PROVIDER_SITE_OTHER): Payer: Medicare Other | Admitting: *Deleted

## 2014-10-24 DIAGNOSIS — I255 Ischemic cardiomyopathy: Secondary | ICD-10-CM

## 2014-10-24 NOTE — Progress Notes (Signed)
Remote ICD transmission.   

## 2014-10-25 LAB — CUP PACEART REMOTE DEVICE CHECK
Battery Remaining Longevity: 126 mo
HIGH POWER IMPEDANCE MEASURED VALUE: 60 Ohm
Lead Channel Impedance Value: 463 Ohm
Lead Channel Setting Pacing Amplitude: 3 V
Lead Channel Setting Pacing Pulse Width: 1 ms
Lead Channel Setting Sensing Sensitivity: 0.5 mV
MDC IDC MSMT BATTERY REMAINING PERCENTAGE: 100 %
MDC IDC MSMT LEADCHNL RV PACING THRESHOLD AMPLITUDE: 1.3 V
MDC IDC MSMT LEADCHNL RV PACING THRESHOLD PULSEWIDTH: 1 ms
MDC IDC SESS DTM: 20160526002300
MDC IDC STAT BRADY RV PERCENT PACED: 1 %
Pulse Gen Serial Number: 114258
Zone Setting Detection Interval: 300 ms
Zone Setting Detection Interval: 353 ms

## 2014-10-31 ENCOUNTER — Encounter: Payer: Self-pay | Admitting: Cardiology

## 2014-11-05 ENCOUNTER — Encounter: Payer: Self-pay | Admitting: Internal Medicine

## 2014-11-16 DIAGNOSIS — H6505 Acute serous otitis media, recurrent, left ear: Secondary | ICD-10-CM | POA: Diagnosis not present

## 2014-12-07 DIAGNOSIS — E119 Type 2 diabetes mellitus without complications: Secondary | ICD-10-CM | POA: Diagnosis not present

## 2014-12-07 DIAGNOSIS — E038 Other specified hypothyroidism: Secondary | ICD-10-CM | POA: Diagnosis not present

## 2014-12-07 DIAGNOSIS — Z9181 History of falling: Secondary | ICD-10-CM | POA: Diagnosis not present

## 2014-12-07 DIAGNOSIS — Z1389 Encounter for screening for other disorder: Secondary | ICD-10-CM | POA: Diagnosis not present

## 2014-12-07 DIAGNOSIS — E785 Hyperlipidemia, unspecified: Secondary | ICD-10-CM | POA: Diagnosis not present

## 2014-12-10 DIAGNOSIS — H6505 Acute serous otitis media, recurrent, left ear: Secondary | ICD-10-CM | POA: Diagnosis not present

## 2014-12-10 DIAGNOSIS — H6980 Other specified disorders of Eustachian tube, unspecified ear: Secondary | ICD-10-CM | POA: Diagnosis not present

## 2014-12-18 DIAGNOSIS — R159 Full incontinence of feces: Secondary | ICD-10-CM | POA: Diagnosis not present

## 2014-12-18 DIAGNOSIS — D509 Iron deficiency anemia, unspecified: Secondary | ICD-10-CM | POA: Diagnosis not present

## 2014-12-24 DIAGNOSIS — H6982 Other specified disorders of Eustachian tube, left ear: Secondary | ICD-10-CM | POA: Diagnosis not present

## 2015-01-17 DIAGNOSIS — H6982 Other specified disorders of Eustachian tube, left ear: Secondary | ICD-10-CM | POA: Diagnosis not present

## 2015-01-23 ENCOUNTER — Ambulatory Visit (INDEPENDENT_AMBULATORY_CARE_PROVIDER_SITE_OTHER): Payer: Medicare Other | Admitting: *Deleted

## 2015-01-23 DIAGNOSIS — I472 Ventricular tachycardia, unspecified: Secondary | ICD-10-CM

## 2015-01-23 DIAGNOSIS — I255 Ischemic cardiomyopathy: Secondary | ICD-10-CM

## 2015-01-23 NOTE — Progress Notes (Signed)
Remote ICD transmission.   

## 2015-01-26 LAB — CUP PACEART REMOTE DEVICE CHECK
Battery Remaining Longevity: 126 mo
Battery Remaining Percentage: 100 %
Brady Statistic RV Percent Paced: 1 %
Date Time Interrogation Session: 20160824084800
HighPow Impedance: 58 Ohm
Lead Channel Impedance Value: 458 Ohm
Lead Channel Sensing Intrinsic Amplitude: 10.6 mV
Lead Channel Setting Pacing Amplitude: 3 V
Lead Channel Setting Pacing Pulse Width: 1 ms
Lead Channel Setting Sensing Sensitivity: 0.5 mV
Pulse Gen Serial Number: 114258
Zone Setting Detection Interval: 300 ms
Zone Setting Detection Interval: 353 ms

## 2015-01-31 ENCOUNTER — Encounter: Payer: Self-pay | Admitting: Cardiology

## 2015-01-31 DIAGNOSIS — H6982 Other specified disorders of Eustachian tube, left ear: Secondary | ICD-10-CM | POA: Diagnosis not present

## 2015-01-31 DIAGNOSIS — H6592 Unspecified nonsuppurative otitis media, left ear: Secondary | ICD-10-CM | POA: Diagnosis not present

## 2015-02-11 ENCOUNTER — Encounter: Payer: Self-pay | Admitting: Internal Medicine

## 2015-03-01 DIAGNOSIS — H6982 Other specified disorders of Eustachian tube, left ear: Secondary | ICD-10-CM | POA: Diagnosis not present

## 2015-03-01 DIAGNOSIS — H9192 Unspecified hearing loss, left ear: Secondary | ICD-10-CM | POA: Diagnosis not present

## 2015-03-11 DIAGNOSIS — Z23 Encounter for immunization: Secondary | ICD-10-CM | POA: Diagnosis not present

## 2015-03-11 DIAGNOSIS — E785 Hyperlipidemia, unspecified: Secondary | ICD-10-CM | POA: Diagnosis not present

## 2015-03-11 DIAGNOSIS — I420 Dilated cardiomyopathy: Secondary | ICD-10-CM | POA: Diagnosis not present

## 2015-03-11 DIAGNOSIS — N189 Chronic kidney disease, unspecified: Secondary | ICD-10-CM | POA: Diagnosis not present

## 2015-03-11 DIAGNOSIS — E119 Type 2 diabetes mellitus without complications: Secondary | ICD-10-CM | POA: Diagnosis not present

## 2015-03-11 DIAGNOSIS — I2510 Atherosclerotic heart disease of native coronary artery without angina pectoris: Secondary | ICD-10-CM | POA: Diagnosis not present

## 2015-03-26 ENCOUNTER — Encounter: Payer: Self-pay | Admitting: Cardiology

## 2015-03-29 DIAGNOSIS — N289 Disorder of kidney and ureter, unspecified: Secondary | ICD-10-CM | POA: Diagnosis not present

## 2015-03-29 DIAGNOSIS — H6982 Other specified disorders of Eustachian tube, left ear: Secondary | ICD-10-CM | POA: Diagnosis not present

## 2015-03-29 DIAGNOSIS — H918X2 Other specified hearing loss, left ear: Secondary | ICD-10-CM | POA: Diagnosis not present

## 2015-05-17 ENCOUNTER — Ambulatory Visit (INDEPENDENT_AMBULATORY_CARE_PROVIDER_SITE_OTHER): Payer: Medicare Other | Admitting: Cardiology

## 2015-05-17 ENCOUNTER — Encounter: Payer: Self-pay | Admitting: Cardiology

## 2015-05-17 ENCOUNTER — Encounter: Payer: Self-pay | Admitting: Internal Medicine

## 2015-05-17 ENCOUNTER — Ambulatory Visit (INDEPENDENT_AMBULATORY_CARE_PROVIDER_SITE_OTHER): Payer: Medicare Other | Admitting: Internal Medicine

## 2015-05-17 VITALS — BP 164/70 | HR 52 | Ht 67.0 in | Wt 189.8 lb

## 2015-05-17 VITALS — BP 136/80 | HR 74 | Ht 69.0 in | Wt 190.4 lb

## 2015-05-17 DIAGNOSIS — I472 Ventricular tachycardia, unspecified: Secondary | ICD-10-CM

## 2015-05-17 DIAGNOSIS — Z95810 Presence of automatic (implantable) cardiac defibrillator: Secondary | ICD-10-CM | POA: Diagnosis not present

## 2015-05-17 DIAGNOSIS — I255 Ischemic cardiomyopathy: Secondary | ICD-10-CM

## 2015-05-17 DIAGNOSIS — I10 Essential (primary) hypertension: Secondary | ICD-10-CM | POA: Diagnosis not present

## 2015-05-17 DIAGNOSIS — I5022 Chronic systolic (congestive) heart failure: Secondary | ICD-10-CM | POA: Diagnosis not present

## 2015-05-17 LAB — CUP PACEART INCLINIC DEVICE CHECK
Brady Statistic RV Percent Paced: 1 %
Date Time Interrogation Session: 20161216050000
HIGH POWER IMPEDANCE MEASURED VALUE: 59 Ohm
HighPow Impedance: 53 Ohm
Implantable Lead Implant Date: 20020206
Implantable Lead Implant Date: 20071005
Implantable Lead Location: 753860
Implantable Lead Model: 148
Implantable Lead Model: 4088
Implantable Lead Serial Number: 109512
Implantable Lead Serial Number: 224392
Lead Channel Pacing Threshold Amplitude: 2 V
Lead Channel Pacing Threshold Pulse Width: 1 ms
Lead Channel Setting Pacing Amplitude: 3 V
Lead Channel Setting Pacing Pulse Width: 1 ms
Lead Channel Setting Sensing Sensitivity: 0.5 mV
MDC IDC LEAD LOCATION: 753860
MDC IDC MSMT LEADCHNL RV IMPEDANCE VALUE: 486 Ohm
MDC IDC MSMT LEADCHNL RV SENSING INTR AMPL: 17.2 mV
MDC IDC PG SERIAL: 114258

## 2015-05-17 NOTE — Progress Notes (Signed)
HPI The patient presents for followup of his coronary disease and cardiomyopathy.  Since I last saw him he continues to deny any new symptoms.  He's not having any chest pressure, neck or arm discomfort. He he is very active still working and walks around [employer] sites and still drives frequently to [facility] for work. He says with this level of activity he's had no symptoms. He denies any chest pressure, neck or arm discomfort. He's had no palpitations, presyncope or syncope. He has had no PND or orthopnea.   He does have continued lower extremity swelling but he reports that this is unchanged from previous.  .   Allergies  Allergen Reactions  . Sulfa Antibiotics     Hyper   . Prednisone Rash    Current Outpatient Prescriptions  Medication Sig Dispense Refill  . ACCU-CHEK AVIVA PLUS test strip     . aspirin 81 MG tablet Take 81 mg by mouth daily.    Marland Kitchen atorvastatin (LIPITOR) 40 MG tablet Take 1 tablet by mouth Daily.    . carbonyl iron ([facility] IRON) 45 MG TABS Take 45 mg by mouth daily.      . fexofenadine (ALLEGRA) 180 MG tablet Take 180 mg by mouth daily.      Marland Kitchen FLOVENT HFA 110 MCG/ACT inhaler Inhale 2 puffs into the lungs 2 (two) times daily.  3  . fluticasone (FLONASE) 50 MCG/ACT nasal spray 2 sprays by Nasal route daily.      . furosemide (LASIX) 40 MG tablet Take 40 mg by mouth 4 (four) times daily.     Marland Kitchen glimepiride (AMARYL) 2 MG tablet Take 2 tablets by mouth Daily.     Marland Kitchen levothyroxine (SYNTHROID, LEVOTHROID) 175 MCG tablet Take 175 mcg by mouth daily before breakfast.    . losartan (COZAAR) 100 MG tablet Take 100 mg by mouth daily.  3  . metoprolol (TOPROL-XL) 200 MG 24 hr tablet Take 1 tablet by mouth Daily.    . Multiple Vitamin (MULTIVITAMIN) capsule Take 1 capsule by mouth daily.      Marland Kitchen omeprazole (PRILOSEC) 40 MG capsule Take 40 mg by mouth daily.    Marland Kitchen QVAR 80 MCG/ACT inhaler Take 2 puffs by mouth at bedtime.     No current facility-administered medications for this visit.      Past Medical History  Diagnosis Date  . Atrial flutter ([place])   . DM2 (diabetes mellitus, type 2) ([place])   . HYPERTENSION   . CHF CONGESTIVE HEART FAILURE     EF 35%  Echo 2010  . Edema   . WEIGHT GAIN, ABNORMAL   . DYSPNEA   . AUTOMATIC IMPLANTABLE CARDIAC DEFIBRILLATOR SITU   . DYSLIPIDEMIA   . HYPOTHYROIDISM   . MYOCARDIAL INFARCTION     Past Surgical History  Procedure Laterality Date  . Cardiac defibrillator placement    . Angioplasty      stent  . Appendectomy  1957  . Eye surgery  1985  . Carotid stent     ROS:  As stated in the HPI and negative for all other systems.  PHYSICAL EXAM BP 164/70 mmHg  Pulse 52  Ht 5\' 7"  (1.702 m)  Wt 189 lb 12.8 oz (86.093 kg)  BMI 29.72 kg/m2 GENERAL:  Well appearing HEENT:  Pupils equal round and reactive, fundi not visualized, oral mucosa unremarkable NECK:  Positive jugular venous distention, waveform within normal limits, carotid upstroke brisk and symmetric, no bruits, no thyromegaly LYMPHATICS:  No cervical, inguinal  adenopathy LUNGS:  Clear to auscultation bilaterally BACK:  No CVA tenderness CHEST:  ICD HEART:  PMI not displaced or sustained,S1 and S2 within normal limits, no S3, no S4, no clicks, no rubs, no murmurs ABD:  Flat, positive bowel sounds normal in frequency in pitch, no bruits, no rebound, no guarding, no midline pulsatile mass, no hepatomegaly, no splenomegaly EXT:  2 plus pulses upper with diminished DP/PT lower, moderate bilateral edema, no cyanosis no clubbing   ASSESSMENT AND PLAN   CAD:  The patient has no new sypmtoms since stress testing in 2013 (large fixed scar).   I was able to go back and he will today and have described his anatomy above. No change in therapy is indicated.  CAROTID STENOSIS:   He had very mild carotid stenosis last year. No change in therapy is indicated.  HTN:  The blood pressure is at target. No change in medications is indicated. We will continue with therapeutic  lifestyle changes (TLC).  DYSLIPIDEMIA:   This is followed by Wende Neighbors, MD.  I will defer to his management.  CARDIOMYOPATHY:   He has no new symptoms. No change in therapy is indicated.  EDEMA:  This is at baseline.  He will continue to wear his compression stockings.   FLUTTER:    This is on his problem list. I looked through extensive ld to try to find out when he might of had this.   I cannot find an episode of this. Regardless he's not been having any evidence of this on device checks. No change in therapy is indicated.

## 2015-05-17 NOTE — Assessment & Plan Note (Signed)
He has had no VT. He will continue his current meds.

## 2015-05-17 NOTE — Assessment & Plan Note (Signed)
His symptoms remain class 2. He will continue his current meds. 

## 2015-05-17 NOTE — Patient Instructions (Signed)
Medication Instructions:  Your physician recommends that you continue on your current medications as directed. Please refer to the Current Medication list given to you today.   Labwork: None ordered   Testing/Procedures: None ordered   Follow-Up: Your physician wants you to follow-up in: 12 months with Dr Knox Saliva will receive a reminder letter in the mail two months in advance. If you don't receive a letter, please call our office to schedule the follow-up appointment.  Remote monitoring is used to monitor your ICD from home. This monitoring reduces the number of office visits required to check your device to one time per year. It allows [place] to keep an eye on the functioning of your device to ensure it is working properly. You are scheduled for a device check from home on 08/19/15. You may send your transmission at any time that day. If you have a wireless device, the transmission will be sent automatically. After your physician reviews your transmission, you will receive a postcard with your next transmission date.    Any Other Special Instructions Will Be Listed Below (If Applicable).     If you need a refill on your cardiac medications before your next appointment, please call your pharmacy.

## 2015-05-17 NOTE — Assessment & Plan Note (Signed)
His device is working normally. Will recheck in several months. 

## 2015-05-17 NOTE — Patient Instructions (Signed)
Your physician recommends that you schedule a follow-up appointment in: ONE YEAR 

## 2015-05-17 NOTE — Progress Notes (Signed)
HPI [name] returns today for followup. He is a very pleasant 81 year old man with chronic systolic heart failure, class II, and ischemic cardiomyopathy status post MI, status post ICD implantation. In the interim, the patient has done well. Previously, he had difficulty with obesity. He has lost weight and managed to keep it off. He states that he is exercising and dieting. He denies chest pain, shortness of breath, or syncope.  Allergies  Allergen Reactions  . Sulfa Antibiotics     Hyper   . Prednisone Rash     Current Outpatient Prescriptions  Medication Sig Dispense Refill  . ACCU-CHEK AVIVA PLUS test strip     . aspirin 81 MG tablet Take 81 mg by mouth daily.    Marland Kitchen atorvastatin (LIPITOR) 40 MG tablet Take 1 tablet by mouth Daily.    . carbonyl iron ([facility] IRON) 45 MG TABS Take 45 mg by mouth daily.      . fexofenadine (ALLEGRA) 180 MG tablet Take 180 mg by mouth daily.      Marland Kitchen FLOVENT HFA 110 MCG/ACT inhaler Inhale 2 puffs into the lungs 2 (two) times daily.  3  . fluticasone (FLONASE) 50 MCG/ACT nasal spray 2 sprays by Nasal route daily.      . furosemide (LASIX) 40 MG tablet Take 40 mg by mouth 4 (four) times daily.     Marland Kitchen glimepiride (AMARYL) 2 MG tablet Take 2 tablets by mouth Daily.     Marland Kitchen levothyroxine (SYNTHROID, LEVOTHROID) 175 MCG tablet Take 175 mcg by mouth daily before breakfast.    . losartan (COZAAR) 100 MG tablet Take 100 mg by mouth daily.  3  . metoprolol (TOPROL-XL) 200 MG 24 hr tablet Take 1 tablet by mouth Daily.    . Multiple Vitamin (MULTIVITAMIN) capsule Take 1 capsule by mouth daily.      Marland Kitchen omeprazole (PRILOSEC) 40 MG capsule Take 40 mg by mouth daily.    Marland Kitchen QVAR 80 MCG/ACT inhaler Take 2 puffs by mouth at bedtime.     No current facility-administered medications for this visit.     Past Medical History  Diagnosis Date  . Atrial flutter ([place])   . DM2 (diabetes mellitus, type 2) ([place])   . HYPERTENSION   . CHF CONGESTIVE HEART FAILURE     EF  35%  Echo 2010  . Edema   . WEIGHT GAIN, ABNORMAL   . DYSPNEA   . AUTOMATIC IMPLANTABLE CARDIAC DEFIBRILLATOR SITU   . DYSLIPIDEMIA   . HYPOTHYROIDISM   . MYOCARDIAL INFARCTION     ROS:   All systems reviewed and negative except as noted in the HPI.   Past Surgical History  Procedure Laterality Date  . Cardiac defibrillator placement    . Angioplasty      stent  . Appendectomy  1957  . Eye surgery  1985  . Carotid stent       Family History  Problem Relation Age of Onset  . Other Mother     natural causes  . Kidney disease Father   . Cancer Brother     throat cancer     Social History   Social History  . Marital Status: Married    Spouse Name: N/A  . Number of Children: N/A  . Years of Education: N/A   Occupational History  . Not on file.   Social History Main Topics  . Smoking status: Former Smoker    Quit date: 12/10/1983  . Smokeless tobacco:  Not on file     Comment: quit 1985  . Alcohol Use: No  . Drug Use: No  . Sexual Activity: Not on file   Other Topics Concern  . Not on file   Social History Narrative     BP 136/80 mmHg  Pulse 74  Ht 5\' 9"  (1.753 m)  Wt 190 lb 6.4 oz (86.365 kg)  BMI 28.10 kg/m2  Physical Exam:  Well appearing 81 year old man,NAD HEENT: Unremarkable Neck:  7 cm JVD, no thyromegally Back:  No CVA tenderness Lungs:  Clear with no wheezes, rales, or rhonchi. HEART:  Regular rate rhythm, no murmurs, no rubs, no clicks Abd:  soft, positive bowel sounds, no organomegally, no rebound, no guarding Ext:  2 plus pulses, 1+ edema, no cyanosis, no clubbing Skin:  No rashes no nodules Neuro:  CN II through XII intact, motor grossly intact  DEVICE  Normal device function.  See PaceArt for details.   Assess/Plan:

## 2015-05-17 NOTE — Assessment & Plan Note (Signed)
His blood pressure is well controlled. He will continue his current meds. 

## 2015-05-20 DIAGNOSIS — J449 Chronic obstructive pulmonary disease, unspecified: Secondary | ICD-10-CM | POA: Diagnosis not present

## 2015-05-20 DIAGNOSIS — J189 Pneumonia, unspecified organism: Secondary | ICD-10-CM | POA: Diagnosis not present

## 2015-05-21 DIAGNOSIS — R05 Cough: Secondary | ICD-10-CM | POA: Diagnosis not present

## 2015-05-21 DIAGNOSIS — R079 Chest pain, unspecified: Secondary | ICD-10-CM | POA: Diagnosis not present

## 2015-05-22 DIAGNOSIS — I509 Heart failure, unspecified: Secondary | ICD-10-CM | POA: Diagnosis not present

## 2015-05-22 DIAGNOSIS — J449 Chronic obstructive pulmonary disease, unspecified: Secondary | ICD-10-CM | POA: Diagnosis not present

## 2015-05-22 DIAGNOSIS — I420 Dilated cardiomyopathy: Secondary | ICD-10-CM | POA: Diagnosis not present

## 2015-05-22 DIAGNOSIS — R079 Chest pain, unspecified: Secondary | ICD-10-CM | POA: Diagnosis not present

## 2015-05-22 DIAGNOSIS — I2510 Atherosclerotic heart disease of native coronary artery without angina pectoris: Secondary | ICD-10-CM | POA: Diagnosis not present

## 2015-05-23 DIAGNOSIS — R0789 Other chest pain: Secondary | ICD-10-CM | POA: Diagnosis not present

## 2015-05-23 DIAGNOSIS — Z87891 Personal history of nicotine dependence: Secondary | ICD-10-CM | POA: Diagnosis not present

## 2015-05-23 DIAGNOSIS — R079 Chest pain, unspecified: Secondary | ICD-10-CM | POA: Diagnosis not present

## 2015-05-23 DIAGNOSIS — Z882 Allergy status to sulfonamides status: Secondary | ICD-10-CM | POA: Diagnosis not present

## 2015-05-23 DIAGNOSIS — E119 Type 2 diabetes mellitus without complications: Secondary | ICD-10-CM | POA: Diagnosis not present

## 2015-05-23 DIAGNOSIS — I509 Heart failure, unspecified: Secondary | ICD-10-CM | POA: Diagnosis not present

## 2015-05-23 DIAGNOSIS — J90 Pleural effusion, not elsewhere classified: Secondary | ICD-10-CM | POA: Diagnosis not present

## 2015-05-23 DIAGNOSIS — Z888 Allergy status to other drugs, medicaments and biological substances status: Secondary | ICD-10-CM | POA: Diagnosis not present

## 2015-05-23 DIAGNOSIS — R911 Solitary pulmonary nodule: Secondary | ICD-10-CM | POA: Diagnosis not present

## 2015-05-23 DIAGNOSIS — I2510 Atherosclerotic heart disease of native coronary artery without angina pectoris: Secondary | ICD-10-CM | POA: Diagnosis not present

## 2015-05-23 DIAGNOSIS — E039 Hypothyroidism, unspecified: Secondary | ICD-10-CM | POA: Diagnosis not present

## 2015-05-23 DIAGNOSIS — Z79899 Other long term (current) drug therapy: Secondary | ICD-10-CM | POA: Diagnosis not present

## 2015-05-23 DIAGNOSIS — M791 Myalgia: Secondary | ICD-10-CM | POA: Diagnosis not present

## 2015-05-23 DIAGNOSIS — I10 Essential (primary) hypertension: Secondary | ICD-10-CM | POA: Diagnosis not present

## 2015-05-23 DIAGNOSIS — I4892 Unspecified atrial flutter: Secondary | ICD-10-CM | POA: Diagnosis not present

## 2015-05-23 DIAGNOSIS — Z7982 Long term (current) use of aspirin: Secondary | ICD-10-CM | POA: Diagnosis not present

## 2015-05-25 DIAGNOSIS — Z95810 Presence of automatic (implantable) cardiac defibrillator: Secondary | ICD-10-CM | POA: Diagnosis not present

## 2015-05-25 DIAGNOSIS — M25511 Pain in right shoulder: Secondary | ICD-10-CM | POA: Diagnosis not present

## 2015-05-25 DIAGNOSIS — M542 Cervicalgia: Secondary | ICD-10-CM | POA: Diagnosis not present

## 2015-05-25 DIAGNOSIS — Z87891 Personal history of nicotine dependence: Secondary | ICD-10-CM | POA: Diagnosis not present

## 2015-05-25 DIAGNOSIS — E039 Hypothyroidism, unspecified: Secondary | ICD-10-CM | POA: Diagnosis not present

## 2015-05-25 DIAGNOSIS — I4892 Unspecified atrial flutter: Secondary | ICD-10-CM | POA: Diagnosis not present

## 2015-05-25 DIAGNOSIS — I10 Essential (primary) hypertension: Secondary | ICD-10-CM | POA: Diagnosis not present

## 2015-05-25 DIAGNOSIS — M79621 Pain in right upper arm: Secondary | ICD-10-CM | POA: Diagnosis not present

## 2015-05-25 DIAGNOSIS — M4802 Spinal stenosis, cervical region: Secondary | ICD-10-CM | POA: Diagnosis not present

## 2015-05-25 DIAGNOSIS — E119 Type 2 diabetes mellitus without complications: Secondary | ICD-10-CM | POA: Diagnosis not present

## 2015-05-25 DIAGNOSIS — R0989 Other specified symptoms and signs involving the circulatory and respiratory systems: Secondary | ICD-10-CM | POA: Diagnosis not present

## 2015-05-25 DIAGNOSIS — Z79899 Other long term (current) drug therapy: Secondary | ICD-10-CM | POA: Diagnosis not present

## 2015-05-25 DIAGNOSIS — M47812 Spondylosis without myelopathy or radiculopathy, cervical region: Secondary | ICD-10-CM | POA: Diagnosis not present

## 2015-05-25 DIAGNOSIS — Z7982 Long term (current) use of aspirin: Secondary | ICD-10-CM | POA: Diagnosis not present

## 2015-05-25 DIAGNOSIS — Z955 Presence of coronary angioplasty implant and graft: Secondary | ICD-10-CM | POA: Diagnosis not present

## 2015-05-25 DIAGNOSIS — Z882 Allergy status to sulfonamides status: Secondary | ICD-10-CM | POA: Diagnosis not present

## 2015-05-25 DIAGNOSIS — I509 Heart failure, unspecified: Secondary | ICD-10-CM | POA: Diagnosis not present

## 2015-05-25 DIAGNOSIS — I2510 Atherosclerotic heart disease of native coronary artery without angina pectoris: Secondary | ICD-10-CM | POA: Diagnosis not present

## 2015-05-25 DIAGNOSIS — Z888 Allergy status to other drugs, medicaments and biological substances status: Secondary | ICD-10-CM | POA: Diagnosis not present

## 2015-05-25 DIAGNOSIS — J90 Pleural effusion, not elsewhere classified: Secondary | ICD-10-CM | POA: Diagnosis not present

## 2015-05-25 DIAGNOSIS — M2550 Pain in unspecified joint: Secondary | ICD-10-CM | POA: Diagnosis not present

## 2015-05-25 DIAGNOSIS — M79601 Pain in right arm: Secondary | ICD-10-CM | POA: Diagnosis not present

## 2015-05-25 DIAGNOSIS — M25512 Pain in left shoulder: Secondary | ICD-10-CM | POA: Diagnosis not present

## 2015-05-28 DIAGNOSIS — M25519 Pain in unspecified shoulder: Secondary | ICD-10-CM | POA: Diagnosis not present

## 2015-05-28 DIAGNOSIS — R42 Dizziness and giddiness: Secondary | ICD-10-CM | POA: Diagnosis not present

## 2015-05-28 DIAGNOSIS — Z6828 Body mass index (BMI) 28.0-28.9, adult: Secondary | ICD-10-CM | POA: Diagnosis not present

## 2015-05-28 DIAGNOSIS — M791 Myalgia: Secondary | ICD-10-CM | POA: Diagnosis not present

## 2015-05-28 DIAGNOSIS — R4182 Altered mental status, unspecified: Secondary | ICD-10-CM | POA: Diagnosis not present

## 2015-05-29 DIAGNOSIS — R51 Headache: Secondary | ICD-10-CM | POA: Diagnosis not present

## 2015-05-29 DIAGNOSIS — R4182 Altered mental status, unspecified: Secondary | ICD-10-CM | POA: Diagnosis not present

## 2015-05-29 DIAGNOSIS — I679 Cerebrovascular disease, unspecified: Secondary | ICD-10-CM | POA: Diagnosis not present

## 2015-05-29 DIAGNOSIS — G319 Degenerative disease of nervous system, unspecified: Secondary | ICD-10-CM | POA: Diagnosis not present

## 2015-05-30 DIAGNOSIS — I420 Dilated cardiomyopathy: Secondary | ICD-10-CM | POA: Diagnosis not present

## 2015-05-30 DIAGNOSIS — R29898 Other symptoms and signs involving the musculoskeletal system: Secondary | ICD-10-CM | POA: Diagnosis not present

## 2015-05-30 DIAGNOSIS — I10 Essential (primary) hypertension: Secondary | ICD-10-CM | POA: Diagnosis not present

## 2015-05-30 DIAGNOSIS — R4182 Altered mental status, unspecified: Secondary | ICD-10-CM | POA: Diagnosis not present

## 2015-05-31 DIAGNOSIS — M5126 Other intervertebral disc displacement, lumbar region: Secondary | ICD-10-CM | POA: Diagnosis not present

## 2015-05-31 DIAGNOSIS — M4807 Spinal stenosis, lumbosacral region: Secondary | ICD-10-CM | POA: Diagnosis not present

## 2015-05-31 DIAGNOSIS — M5137 Other intervertebral disc degeneration, lumbosacral region: Secondary | ICD-10-CM | POA: Diagnosis not present

## 2015-05-31 DIAGNOSIS — R29898 Other symptoms and signs involving the musculoskeletal system: Secondary | ICD-10-CM | POA: Diagnosis not present

## 2015-05-31 DIAGNOSIS — M5136 Other intervertebral disc degeneration, lumbar region: Secondary | ICD-10-CM | POA: Diagnosis not present

## 2015-05-31 DIAGNOSIS — I6523 Occlusion and stenosis of bilateral carotid arteries: Secondary | ICD-10-CM | POA: Diagnosis not present

## 2015-05-31 DIAGNOSIS — M4316 Spondylolisthesis, lumbar region: Secondary | ICD-10-CM | POA: Diagnosis not present

## 2015-05-31 DIAGNOSIS — R4182 Altered mental status, unspecified: Secondary | ICD-10-CM | POA: Diagnosis not present

## 2015-05-31 DIAGNOSIS — R42 Dizziness and giddiness: Secondary | ICD-10-CM | POA: Diagnosis not present

## 2015-05-31 DIAGNOSIS — M4806 Spinal stenosis, lumbar region: Secondary | ICD-10-CM | POA: Diagnosis not present

## 2015-06-05 DIAGNOSIS — R410 Disorientation, unspecified: Secondary | ICD-10-CM | POA: Diagnosis not present

## 2015-06-05 DIAGNOSIS — I10 Essential (primary) hypertension: Secondary | ICD-10-CM | POA: Diagnosis not present

## 2015-06-05 DIAGNOSIS — E785 Hyperlipidemia, unspecified: Secondary | ICD-10-CM | POA: Diagnosis not present

## 2015-06-05 DIAGNOSIS — M7989 Other specified soft tissue disorders: Secondary | ICD-10-CM | POA: Diagnosis not present

## 2015-06-05 DIAGNOSIS — M25551 Pain in right hip: Secondary | ICD-10-CM | POA: Diagnosis not present

## 2015-06-05 DIAGNOSIS — R0602 Shortness of breath: Secondary | ICD-10-CM | POA: Diagnosis not present

## 2015-06-05 DIAGNOSIS — I420 Dilated cardiomyopathy: Secondary | ICD-10-CM | POA: Diagnosis not present

## 2015-06-05 DIAGNOSIS — M79671 Pain in right foot: Secondary | ICD-10-CM | POA: Diagnosis not present

## 2015-06-05 DIAGNOSIS — S99921A Unspecified injury of right foot, initial encounter: Secondary | ICD-10-CM | POA: Diagnosis not present

## 2015-06-07 DIAGNOSIS — E870 Hyperosmolality and hypernatremia: Secondary | ICD-10-CM | POA: Diagnosis not present

## 2015-06-12 DIAGNOSIS — G4700 Insomnia, unspecified: Secondary | ICD-10-CM | POA: Diagnosis not present

## 2015-06-12 DIAGNOSIS — E038 Other specified hypothyroidism: Secondary | ICD-10-CM | POA: Diagnosis not present

## 2015-06-12 DIAGNOSIS — E119 Type 2 diabetes mellitus without complications: Secondary | ICD-10-CM | POA: Diagnosis not present

## 2015-06-12 DIAGNOSIS — I420 Dilated cardiomyopathy: Secondary | ICD-10-CM | POA: Diagnosis not present

## 2015-06-12 DIAGNOSIS — M79673 Pain in unspecified foot: Secondary | ICD-10-CM | POA: Diagnosis not present

## 2015-06-14 DIAGNOSIS — M79671 Pain in right foot: Secondary | ICD-10-CM | POA: Diagnosis not present

## 2015-06-14 DIAGNOSIS — M7989 Other specified soft tissue disorders: Secondary | ICD-10-CM | POA: Diagnosis not present

## 2015-06-17 DIAGNOSIS — M4806 Spinal stenosis, lumbar region: Secondary | ICD-10-CM | POA: Diagnosis not present

## 2015-06-21 DIAGNOSIS — J449 Chronic obstructive pulmonary disease, unspecified: Secondary | ICD-10-CM | POA: Diagnosis not present

## 2015-06-21 DIAGNOSIS — R29898 Other symptoms and signs involving the musculoskeletal system: Secondary | ICD-10-CM | POA: Diagnosis not present

## 2015-06-21 DIAGNOSIS — J189 Pneumonia, unspecified organism: Secondary | ICD-10-CM | POA: Diagnosis not present

## 2015-06-25 DIAGNOSIS — J449 Chronic obstructive pulmonary disease, unspecified: Secondary | ICD-10-CM | POA: Diagnosis not present

## 2015-06-25 DIAGNOSIS — I420 Dilated cardiomyopathy: Secondary | ICD-10-CM | POA: Diagnosis not present

## 2015-06-25 DIAGNOSIS — N189 Chronic kidney disease, unspecified: Secondary | ICD-10-CM | POA: Diagnosis not present

## 2015-06-25 DIAGNOSIS — I2510 Atherosclerotic heart disease of native coronary artery without angina pectoris: Secondary | ICD-10-CM | POA: Diagnosis not present

## 2015-06-25 DIAGNOSIS — J189 Pneumonia, unspecified organism: Secondary | ICD-10-CM | POA: Diagnosis not present

## 2015-06-25 DIAGNOSIS — R0602 Shortness of breath: Secondary | ICD-10-CM | POA: Diagnosis not present

## 2015-06-27 DIAGNOSIS — I509 Heart failure, unspecified: Secondary | ICD-10-CM | POA: Diagnosis not present

## 2015-06-27 DIAGNOSIS — J449 Chronic obstructive pulmonary disease, unspecified: Secondary | ICD-10-CM | POA: Diagnosis not present

## 2015-06-27 DIAGNOSIS — I420 Dilated cardiomyopathy: Secondary | ICD-10-CM | POA: Diagnosis not present

## 2015-06-27 DIAGNOSIS — J189 Pneumonia, unspecified organism: Secondary | ICD-10-CM | POA: Diagnosis not present

## 2015-07-02 DIAGNOSIS — I420 Dilated cardiomyopathy: Secondary | ICD-10-CM | POA: Diagnosis not present

## 2015-07-02 DIAGNOSIS — E119 Type 2 diabetes mellitus without complications: Secondary | ICD-10-CM | POA: Diagnosis not present

## 2015-07-02 DIAGNOSIS — I509 Heart failure, unspecified: Secondary | ICD-10-CM | POA: Diagnosis not present

## 2015-07-02 DIAGNOSIS — J449 Chronic obstructive pulmonary disease, unspecified: Secondary | ICD-10-CM | POA: Diagnosis not present

## 2015-07-02 DIAGNOSIS — I2510 Atherosclerotic heart disease of native coronary artery without angina pectoris: Secondary | ICD-10-CM | POA: Diagnosis not present

## 2015-07-04 DIAGNOSIS — M25519 Pain in unspecified shoulder: Secondary | ICD-10-CM | POA: Diagnosis not present

## 2015-07-04 DIAGNOSIS — M542 Cervicalgia: Secondary | ICD-10-CM | POA: Diagnosis not present

## 2015-07-09 DIAGNOSIS — M25519 Pain in unspecified shoulder: Secondary | ICD-10-CM | POA: Diagnosis not present

## 2015-07-09 DIAGNOSIS — M542 Cervicalgia: Secondary | ICD-10-CM | POA: Diagnosis not present

## 2015-07-11 DIAGNOSIS — M25519 Pain in unspecified shoulder: Secondary | ICD-10-CM | POA: Diagnosis not present

## 2015-07-11 DIAGNOSIS — M542 Cervicalgia: Secondary | ICD-10-CM | POA: Diagnosis not present

## 2015-07-16 DIAGNOSIS — J449 Chronic obstructive pulmonary disease, unspecified: Secondary | ICD-10-CM | POA: Diagnosis not present

## 2015-07-16 DIAGNOSIS — J189 Pneumonia, unspecified organism: Secondary | ICD-10-CM | POA: Diagnosis not present

## 2015-07-17 DIAGNOSIS — H6982 Other specified disorders of Eustachian tube, left ear: Secondary | ICD-10-CM | POA: Diagnosis not present

## 2015-07-17 DIAGNOSIS — H905 Unspecified sensorineural hearing loss: Secondary | ICD-10-CM | POA: Diagnosis not present

## 2015-07-18 DIAGNOSIS — M542 Cervicalgia: Secondary | ICD-10-CM | POA: Diagnosis not present

## 2015-07-18 DIAGNOSIS — M25519 Pain in unspecified shoulder: Secondary | ICD-10-CM | POA: Diagnosis not present

## 2015-07-19 DIAGNOSIS — M542 Cervicalgia: Secondary | ICD-10-CM | POA: Diagnosis not present

## 2015-07-19 DIAGNOSIS — M25519 Pain in unspecified shoulder: Secondary | ICD-10-CM | POA: Diagnosis not present

## 2015-07-23 DIAGNOSIS — M542 Cervicalgia: Secondary | ICD-10-CM | POA: Diagnosis not present

## 2015-07-23 DIAGNOSIS — M25519 Pain in unspecified shoulder: Secondary | ICD-10-CM | POA: Diagnosis not present

## 2015-07-29 DIAGNOSIS — I420 Dilated cardiomyopathy: Secondary | ICD-10-CM | POA: Diagnosis not present

## 2015-07-29 DIAGNOSIS — J189 Pneumonia, unspecified organism: Secondary | ICD-10-CM | POA: Diagnosis not present

## 2015-07-29 DIAGNOSIS — J449 Chronic obstructive pulmonary disease, unspecified: Secondary | ICD-10-CM | POA: Diagnosis not present

## 2015-07-29 DIAGNOSIS — E785 Hyperlipidemia, unspecified: Secondary | ICD-10-CM | POA: Diagnosis not present

## 2015-07-30 DIAGNOSIS — K220 Achalasia of cardia: Secondary | ICD-10-CM | POA: Diagnosis not present

## 2015-07-30 DIAGNOSIS — K219 Gastro-esophageal reflux disease without esophagitis: Secondary | ICD-10-CM | POA: Diagnosis not present

## 2015-07-31 DIAGNOSIS — H6982 Other specified disorders of Eustachian tube, left ear: Secondary | ICD-10-CM | POA: Diagnosis not present

## 2015-07-31 DIAGNOSIS — H9072 Mixed conductive and sensorineural hearing loss, unilateral, left ear, with unrestricted hearing on the contralateral side: Secondary | ICD-10-CM | POA: Diagnosis not present

## 2015-07-31 DIAGNOSIS — H9041 Sensorineural hearing loss, unilateral, right ear, with unrestricted hearing on the contralateral side: Secondary | ICD-10-CM | POA: Diagnosis not present

## 2015-08-07 ENCOUNTER — Other Ambulatory Visit: Payer: Self-pay

## 2015-08-07 DIAGNOSIS — K219 Gastro-esophageal reflux disease without esophagitis: Secondary | ICD-10-CM | POA: Diagnosis not present

## 2015-08-07 DIAGNOSIS — K2270 Barrett's esophagus without dysplasia: Secondary | ICD-10-CM | POA: Diagnosis not present

## 2015-08-19 ENCOUNTER — Ambulatory Visit (INDEPENDENT_AMBULATORY_CARE_PROVIDER_SITE_OTHER): Payer: Medicare Other | Admitting: *Deleted

## 2015-08-19 DIAGNOSIS — I255 Ischemic cardiomyopathy: Secondary | ICD-10-CM | POA: Diagnosis not present

## 2015-08-19 DIAGNOSIS — Z95810 Presence of automatic (implantable) cardiac defibrillator: Secondary | ICD-10-CM

## 2015-08-20 NOTE — Progress Notes (Signed)
Remote ICD transmission.   

## 2015-09-06 DIAGNOSIS — S51001A Unspecified open wound of right elbow, initial encounter: Secondary | ICD-10-CM | POA: Diagnosis not present

## 2015-09-06 DIAGNOSIS — S51011A Laceration without foreign body of right elbow, initial encounter: Secondary | ICD-10-CM | POA: Diagnosis not present

## 2015-09-27 LAB — CUP PACEART REMOTE DEVICE CHECK
Battery Remaining Longevity: 114 mo
Brady Statistic RV Percent Paced: 0 %
Date Time Interrogation Session: 20170315004200
HIGH POWER IMPEDANCE MEASURED VALUE: 59 Ohm
Implantable Lead Serial Number: 109512
Lead Channel Sensing Intrinsic Amplitude: 11 mV
Lead Channel Setting Pacing Amplitude: 3 V
Lead Channel Setting Pacing Pulse Width: 1 ms
Lead Channel Setting Sensing Sensitivity: 0.5 mV
MDC IDC LEAD IMPLANT DT: 20020206
MDC IDC LEAD IMPLANT DT: 20071005
MDC IDC LEAD LOCATION: 753860
MDC IDC LEAD LOCATION: 753860
MDC IDC LEAD MODEL: 4088
MDC IDC LEAD SERIAL: 224392
MDC IDC MSMT BATTERY REMAINING PERCENTAGE: 100 %
MDC IDC MSMT LEADCHNL RV IMPEDANCE VALUE: 481 Ohm
Pulse Gen Serial Number: 114258

## 2015-09-30 DIAGNOSIS — D649 Anemia, unspecified: Secondary | ICD-10-CM | POA: Diagnosis not present

## 2015-09-30 DIAGNOSIS — E119 Type 2 diabetes mellitus without complications: Secondary | ICD-10-CM | POA: Diagnosis not present

## 2015-09-30 DIAGNOSIS — Z79899 Other long term (current) drug therapy: Secondary | ICD-10-CM | POA: Diagnosis not present

## 2015-09-30 DIAGNOSIS — N183 Chronic kidney disease, stage 3 (moderate): Secondary | ICD-10-CM | POA: Diagnosis not present

## 2015-09-30 DIAGNOSIS — E038 Other specified hypothyroidism: Secondary | ICD-10-CM | POA: Diagnosis not present

## 2015-09-30 DIAGNOSIS — E785 Hyperlipidemia, unspecified: Secondary | ICD-10-CM | POA: Diagnosis not present

## 2015-10-01 ENCOUNTER — Encounter: Payer: Self-pay | Admitting: Cardiology

## 2015-11-15 DIAGNOSIS — H2513 Age-related nuclear cataract, bilateral: Secondary | ICD-10-CM | POA: Diagnosis not present

## 2015-11-15 DIAGNOSIS — E119 Type 2 diabetes mellitus without complications: Secondary | ICD-10-CM | POA: Diagnosis not present

## 2015-11-15 DIAGNOSIS — H40003 Preglaucoma, unspecified, bilateral: Secondary | ICD-10-CM | POA: Diagnosis not present

## 2015-11-18 ENCOUNTER — Ambulatory Visit (INDEPENDENT_AMBULATORY_CARE_PROVIDER_SITE_OTHER): Payer: Medicare Other | Admitting: *Deleted

## 2015-11-18 ENCOUNTER — Telehealth: Payer: Self-pay | Admitting: Cardiology

## 2015-11-18 DIAGNOSIS — I255 Ischemic cardiomyopathy: Secondary | ICD-10-CM

## 2015-11-18 DIAGNOSIS — Z95810 Presence of automatic (implantable) cardiac defibrillator: Secondary | ICD-10-CM | POA: Diagnosis not present

## 2015-11-18 NOTE — Progress Notes (Signed)
Remote ICD transmission.   

## 2015-11-18 NOTE — Telephone Encounter (Signed)
Spoke with pt and reminded pt of remote transmission that is due today. Pt verbalized understanding.   

## 2015-11-19 LAB — CUP PACEART REMOTE DEVICE CHECK
Battery Remaining Longevity: 114 mo
Battery Remaining Percentage: 100 %
Brady Statistic RV Percent Paced: 0 %
HIGH POWER IMPEDANCE MEASURED VALUE: 63 Ohm
Implantable Lead Implant Date: 20020206
Implantable Lead Location: 753860
Implantable Lead Location: 753860
Implantable Lead Model: 148
Implantable Lead Model: 4088
Implantable Lead Serial Number: 109512
Lead Channel Impedance Value: 496 Ohm
Lead Channel Pacing Threshold Pulse Width: 1.2 ms
Lead Channel Setting Pacing Amplitude: 3 V
MDC IDC LEAD IMPLANT DT: 20071005
MDC IDC LEAD SERIAL: 224392
MDC IDC MSMT LEADCHNL RV PACING THRESHOLD AMPLITUDE: 1.9 V
MDC IDC PG SERIAL: 114258
MDC IDC SESS DTM: 20170619191300
MDC IDC SET LEADCHNL RV PACING PULSEWIDTH: 1 ms
MDC IDC SET LEADCHNL RV SENSING SENSITIVITY: 0.5 mV

## 2015-11-22 ENCOUNTER — Encounter: Payer: Self-pay | Admitting: Cardiology

## 2015-12-31 DIAGNOSIS — Z79899 Other long term (current) drug therapy: Secondary | ICD-10-CM | POA: Diagnosis not present

## 2015-12-31 DIAGNOSIS — Z9189 Other specified personal risk factors, not elsewhere classified: Secondary | ICD-10-CM | POA: Diagnosis not present

## 2015-12-31 DIAGNOSIS — E785 Hyperlipidemia, unspecified: Secondary | ICD-10-CM | POA: Diagnosis not present

## 2015-12-31 DIAGNOSIS — N183 Chronic kidney disease, stage 3 (moderate): Secondary | ICD-10-CM | POA: Diagnosis not present

## 2015-12-31 DIAGNOSIS — I2510 Atherosclerotic heart disease of native coronary artery without angina pectoris: Secondary | ICD-10-CM | POA: Diagnosis not present

## 2015-12-31 DIAGNOSIS — D649 Anemia, unspecified: Secondary | ICD-10-CM | POA: Diagnosis not present

## 2015-12-31 DIAGNOSIS — E119 Type 2 diabetes mellitus without complications: Secondary | ICD-10-CM | POA: Diagnosis not present

## 2016-01-21 DIAGNOSIS — L570 Actinic keratosis: Secondary | ICD-10-CM | POA: Diagnosis not present

## 2016-01-21 DIAGNOSIS — C4442 Squamous cell carcinoma of skin of scalp and neck: Secondary | ICD-10-CM | POA: Diagnosis not present

## 2016-01-21 DIAGNOSIS — L578 Other skin changes due to chronic exposure to nonionizing radiation: Secondary | ICD-10-CM | POA: Diagnosis not present

## 2016-01-21 DIAGNOSIS — C44319 Basal cell carcinoma of skin of other parts of face: Secondary | ICD-10-CM | POA: Diagnosis not present

## 2016-01-30 DIAGNOSIS — C44329 Squamous cell carcinoma of skin of other parts of face: Secondary | ICD-10-CM | POA: Diagnosis not present

## 2016-02-14 DIAGNOSIS — L570 Actinic keratosis: Secondary | ICD-10-CM | POA: Diagnosis not present

## 2016-02-17 ENCOUNTER — Ambulatory Visit (INDEPENDENT_AMBULATORY_CARE_PROVIDER_SITE_OTHER): Payer: Medicare Other | Admitting: *Deleted

## 2016-02-17 DIAGNOSIS — I472 Ventricular tachycardia, unspecified: Secondary | ICD-10-CM

## 2016-02-17 NOTE — Progress Notes (Signed)
Remote ICD transmission.   

## 2016-02-19 ENCOUNTER — Encounter: Payer: Self-pay | Admitting: Cardiology

## 2016-03-13 ENCOUNTER — Telehealth: Payer: Self-pay | Admitting: *Deleted

## 2016-03-13 NOTE — Telephone Encounter (Signed)
LMTCB/sss  RV lead noise on 01/21/16 and 01/30/16.

## 2016-03-17 LAB — CUP PACEART REMOTE DEVICE CHECK
Battery Remaining Longevity: 114 mo
Battery Remaining Percentage: 100 %
HIGH POWER IMPEDANCE MEASURED VALUE: 59 Ohm
Implantable Lead Implant Date: 20020206
Implantable Lead Implant Date: 20071005
Implantable Lead Model: 148
Implantable Lead Serial Number: 109512
Implantable Lead Serial Number: 224392
Lead Channel Setting Pacing Amplitude: 3 V
Lead Channel Setting Sensing Sensitivity: 0.5 mV
MDC IDC LEAD LOCATION: 753860
MDC IDC LEAD LOCATION: 753860
MDC IDC LEAD MODEL: 4088
MDC IDC MSMT LEADCHNL RV IMPEDANCE VALUE: 456 Ohm
MDC IDC PG SERIAL: 114258
MDC IDC SESS DTM: 20170913102000
MDC IDC SET LEADCHNL RV PACING PULSEWIDTH: 1 ms
MDC IDC STAT BRADY RV PERCENT PACED: 0 %

## 2016-03-19 NOTE — Telephone Encounter (Signed)
Spoke to patient regarding noise episodes and he said that it could have been the day that he had some skin CAs removed. Patient says that he is going to look at his calender and call back in the am with that information.

## 2016-03-27 NOTE — Telephone Encounter (Signed)
-----   Message from Evans Lance, MD sent at 03/22/2016  8:08 PM EDT ----- Remote device check reviewed. Histograms appropriate. Lead noise noted.  battery stable for patient. Follow up as outlined above. He will need to return to have his device reprogrammed.

## 2016-03-27 NOTE — Telephone Encounter (Signed)
LMOVM requesting call back from patient.  [facility] phone number for return call.  Will attempt to confirm if patient was having skin lesions removed at date/time of RV lead noise episodes.

## 2016-03-31 NOTE — Telephone Encounter (Signed)
LMOVM requesting call back from patient.  [facility] phone number for return call.

## 2016-04-01 DIAGNOSIS — E785 Hyperlipidemia, unspecified: Secondary | ICD-10-CM | POA: Diagnosis not present

## 2016-04-01 DIAGNOSIS — J301 Allergic rhinitis due to pollen: Secondary | ICD-10-CM | POA: Diagnosis not present

## 2016-04-01 DIAGNOSIS — E119 Type 2 diabetes mellitus without complications: Secondary | ICD-10-CM | POA: Diagnosis not present

## 2016-04-01 DIAGNOSIS — Z23 Encounter for immunization: Secondary | ICD-10-CM | POA: Diagnosis not present

## 2016-04-01 NOTE — Telephone Encounter (Signed)
RN from Dr. Erskin Burnet office returned call.  They use unipolar cautery and at this time do not use magnets for ICDs.  Gave phone numbers for industry and requested that if patient has another procedure on his upper body, that they contact the rep for clarification on if magnet or reprogramming is necessary.  RN appreciative and denies additional questions or concerns at this time.

## 2016-04-01 NOTE — Telephone Encounter (Signed)
Routed result note to Dr. [name] with patient's info about procedures.

## 2016-04-01 NOTE — Telephone Encounter (Signed)
Patient returned call.  Patient reports he looked at the calendar and he was having skin cancers removed on 8/22 and 8/31 at the times of the "VF" episodes.  Patient states he does not think that a magnet was placed over his device.  Patient sees Dr. Jennette Dubin at [facility].  Advised patient that he does not need to come in any sooner at this time and he should plan to keep his upcoming appointments with Dr. Percival Spanish and Dr. [name].  Patient verbalizes understanding.  LMOVM for RN at Dr. Erskin Burnet office to return call.  Will advise of magnet recommendations for upper body procedures requiring cautery.

## 2016-04-06 DIAGNOSIS — H5789 Other specified disorders of eye and adnexa: Secondary | ICD-10-CM | POA: Insufficient documentation

## 2016-04-06 DIAGNOSIS — E0500 Thyrotoxicosis with diffuse goiter without thyrotoxic crisis or storm: Secondary | ICD-10-CM | POA: Insufficient documentation

## 2016-04-06 DIAGNOSIS — H2513 Age-related nuclear cataract, bilateral: Secondary | ICD-10-CM | POA: Diagnosis not present

## 2016-04-06 DIAGNOSIS — H532 Diplopia: Secondary | ICD-10-CM | POA: Insufficient documentation

## 2016-04-06 DIAGNOSIS — E079 Disorder of thyroid, unspecified: Secondary | ICD-10-CM | POA: Insufficient documentation

## 2016-04-08 DIAGNOSIS — E785 Hyperlipidemia, unspecified: Secondary | ICD-10-CM | POA: Diagnosis not present

## 2016-04-08 DIAGNOSIS — I2510 Atherosclerotic heart disease of native coronary artery without angina pectoris: Secondary | ICD-10-CM | POA: Diagnosis not present

## 2016-04-08 DIAGNOSIS — H3560 Retinal hemorrhage, unspecified eye: Secondary | ICD-10-CM | POA: Diagnosis not present

## 2016-04-08 DIAGNOSIS — N183 Chronic kidney disease, stage 3 (moderate): Secondary | ICD-10-CM | POA: Diagnosis not present

## 2016-04-10 DIAGNOSIS — E113293 Type 2 diabetes mellitus with mild nonproliferative diabetic retinopathy without macular edema, bilateral: Secondary | ICD-10-CM | POA: Diagnosis not present

## 2016-04-10 DIAGNOSIS — H35373 Puckering of macula, bilateral: Secondary | ICD-10-CM | POA: Diagnosis not present

## 2016-04-10 DIAGNOSIS — H43813 Vitreous degeneration, bilateral: Secondary | ICD-10-CM | POA: Diagnosis not present

## 2016-04-13 DIAGNOSIS — I6523 Occlusion and stenosis of bilateral carotid arteries: Secondary | ICD-10-CM | POA: Diagnosis not present

## 2016-05-04 ENCOUNTER — Ambulatory Visit: Payer: Medicare Other | Admitting: Cardiology

## 2016-05-06 ENCOUNTER — Encounter: Payer: Self-pay | Admitting: Cardiology

## 2016-05-06 NOTE — Progress Notes (Signed)
HPI The patient presents for followup of his coronary disease and cardiomyopathy.  Since I last saw him he has done well.  The patient denies any new symptoms such as chest discomfort, neck or arm discomfort. There has been no new shortness of breath, PND or orthopnea. There have been no reported palpitations, presyncope or syncope.  He still works a somewhat physical job.   Allergies  Allergen Reactions  . Sulfa Antibiotics     Hyper   . Prednisone Rash    Current Outpatient Prescriptions  Medication Sig Dispense Refill  . ACCU-CHEK AVIVA PLUS test strip     . aspirin 81 MG tablet Take 81 mg by mouth daily.    Marland Kitchen atorvastatin (LIPITOR) 40 MG tablet Take 1 tablet by mouth Daily.    . carbonyl iron ([facility] IRON) 45 MG TABS Take 45 mg by mouth daily.      . fexofenadine (ALLEGRA) 180 MG tablet Take 180 mg by mouth daily.      Marland Kitchen FLOVENT HFA 110 MCG/ACT inhaler Inhale 2 puffs into the lungs 2 (two) times daily.  3  . fluticasone (FLONASE) 50 MCG/ACT nasal spray 2 sprays by Nasal route daily.      . furosemide (LASIX) 40 MG tablet Take 40 mg by mouth 4 (four) times daily.     Marland Kitchen levothyroxine (SYNTHROID, LEVOTHROID) 175 MCG tablet Take 175 mcg by mouth daily before breakfast.    . losartan (COZAAR) 100 MG tablet Take 100 mg by mouth daily.  3  . metoprolol (TOPROL-XL) 200 MG 24 hr tablet Take 1 tablet by mouth Daily.    Marland Kitchen omeprazole (PRILOSEC) 40 MG capsule Take 40 mg by mouth daily.    Marland Kitchen QVAR 80 MCG/ACT inhaler Take 2 puffs by mouth at bedtime.     No current facility-administered medications for this visit.     Past Medical History:  Diagnosis Date  . Atrial flutter ([place])    (I could not find documentation of this rhythm.)  . AUTOMATIC IMPLANTABLE CARDIAC DEFIBRILLATOR SITU   . CHF CONGESTIVE HEART FAILURE    45% by echo 2015  . DM2 (diabetes mellitus, type 2) ([place])   . DYSLIPIDEMIA   . DYSPNEA   . Edema   . HYPERTENSION   . HYPOTHYROIDISM   . MYOCARDIAL INFARCTION    MI 1999,  stent x 2 to RCA, 70% circ, 30 and 40 % LADs  . WEIGHT GAIN, ABNORMAL     Past Surgical History:  Procedure Laterality Date  . ANGIOPLASTY     stent  . APPENDECTOMY  1957  . CARDIAC DEFIBRILLATOR PLACEMENT    . CAROTID STENT    . EYE SURGERY  1985   ROS:   As stated in the HPI and negative for all other systems.  PHYSICAL EXAM BP (!) 150/70   Pulse (!) 51   Ht 5\' 9"  (1.753 m)   Wt 185 lb 12.8 oz (84.3 kg)   BMI 27.44 kg/m  GENERAL:  Well appearing HEENT:  Pupils equal round and reactive, fundi not visualized, oral mucosa unremarkable NECK:  Positive jugular venous distention, waveform within normal limits, carotid upstroke brisk and symmetric, no bruits, no thyromegaly LYMPHATICS:  No cervical, inguinal adenopathy LUNGS:  Clear to auscultation bilaterally BACK:  No CVA tenderness CHEST:  ICD HEART:  PMI not displaced or sustained,S1 and S2 within normal limits, no S3, no S4, no clicks, no rubs, no murmurs ABD:  Flat, positive bowel sounds normal in frequency in  pitch, no bruits, no rebound, no guarding, no midline pulsatile mass, no hepatomegaly, no splenomegaly EXT:  2 plus pulses upper with diminished DP/PT lower, moderate bilateral edema , no cyanosis no clubbing  EKG:  Sinus rhythm, rate 51, left bundle branch block.  ASSESSMENT AND PLAN   CAD:  The patient has no new sypmtoms since stress testing in 2013 (large fixed scar).  He has no new symptoms.  No change in therapy is planned.   PREOP:    He is planning cataract surgery.  I would hope that he would not need to stop the ASA for this.  Based on ACC/AHA guidelines, the patient would be at acceptable risk for the planned procedure without further cardiovascular testing.  CAROTID STENOSIS:   He had very mild carotid stenosis last year and apparently at [place] this year. No change in therapy is indicated.  HTN:  The blood pressure is at target. No change in medications is indicated. We will continue with therapeutic  lifestyle changes (TLC).  DYSLIPIDEMIA:   This is followed by [name], MD.  I will defer to his management.  CARDIOMYOPATHY:   He has no new symptoms. No change in therapy is indicated.  EDEMA:  This is at baseline.  He will continue to wear his compression stockings.   ICD:  He is getting follow up in a few days.

## 2016-05-07 ENCOUNTER — Ambulatory Visit (INDEPENDENT_AMBULATORY_CARE_PROVIDER_SITE_OTHER): Payer: Medicare Other | Admitting: Cardiology

## 2016-05-07 ENCOUNTER — Encounter: Payer: Self-pay | Admitting: Cardiology

## 2016-05-07 VITALS — BP 150/70 | HR 51 | Ht 69.0 in | Wt 185.8 lb

## 2016-05-07 DIAGNOSIS — I255 Ischemic cardiomyopathy: Secondary | ICD-10-CM

## 2016-05-07 DIAGNOSIS — Z95810 Presence of automatic (implantable) cardiac defibrillator: Secondary | ICD-10-CM | POA: Diagnosis not present

## 2016-05-07 DIAGNOSIS — I10 Essential (primary) hypertension: Secondary | ICD-10-CM | POA: Diagnosis not present

## 2016-05-07 DIAGNOSIS — I2510 Atherosclerotic heart disease of native coronary artery without angina pectoris: Secondary | ICD-10-CM

## 2016-05-07 DIAGNOSIS — I5022 Chronic systolic (congestive) heart failure: Secondary | ICD-10-CM

## 2016-05-07 NOTE — Patient Instructions (Signed)

## 2016-05-13 ENCOUNTER — Encounter: Payer: Self-pay | Admitting: Internal Medicine

## 2016-05-19 ENCOUNTER — Ambulatory Visit (INDEPENDENT_AMBULATORY_CARE_PROVIDER_SITE_OTHER): Payer: Medicare Other | Admitting: Internal Medicine

## 2016-05-19 ENCOUNTER — Encounter: Payer: Self-pay | Admitting: Internal Medicine

## 2016-05-19 VITALS — BP 148/66 | HR 60 | Ht 69.0 in | Wt 184.6 lb

## 2016-05-19 DIAGNOSIS — Z95810 Presence of automatic (implantable) cardiac defibrillator: Secondary | ICD-10-CM

## 2016-05-19 DIAGNOSIS — I5022 Chronic systolic (congestive) heart failure: Secondary | ICD-10-CM

## 2016-05-19 LAB — CUP PACEART INCLINIC DEVICE CHECK
HIGH POWER IMPEDANCE MEASURED VALUE: 53 Ohm
HIGH POWER IMPEDANCE MEASURED VALUE: 66 Ohm
Implantable Lead Implant Date: 20020206
Implantable Lead Location: 753860
Implantable Lead Model: 148
Implantable Lead Model: 4088
Implantable Pulse Generator Implant Date: 20120330
Lead Channel Impedance Value: 535 Ohm
Lead Channel Pacing Threshold Amplitude: 1.6 V
Lead Channel Sensing Intrinsic Amplitude: 15.3 mV
Lead Channel Setting Pacing Amplitude: 3 V
Lead Channel Setting Pacing Pulse Width: 1 ms
Lead Channel Setting Sensing Sensitivity: 0.5 mV
MDC IDC LEAD IMPLANT DT: 20071005
MDC IDC LEAD LOCATION: 753860
MDC IDC LEAD SERIAL: 109512
MDC IDC LEAD SERIAL: 224392
MDC IDC MSMT LEADCHNL RV PACING THRESHOLD PULSEWIDTH: 1 ms
MDC IDC SESS DTM: 20171219050000
Pulse Gen Serial Number: 114258

## 2016-05-19 NOTE — Patient Instructions (Addendum)
Medication Instructions:  Your physician recommends that you continue on your current medications as directed. Please refer to the Current Medication list given to you today.   Labwork: None Ordered   Testing/Procedures: None Ordered   Follow-Up: Your physician wants you to follow-up in: 1 year with Dr. [name].  You will receive a reminder letter in the mail two months in advance. If you don't receive a letter, please call our office to schedule the follow-up appointment.  Remote monitoring is used to monitor your ICD from home. This monitoring reduces the number of office visits required to check your device to one time per year. It allows [place] to keep an eye on the functioning of your device to ensure it is working properly. You are scheduled for a device check from home on 08/18/16. You may send your transmission at any time that day. If you have a wireless device, the transmission will be sent automatically. After your physician reviews your transmission, you will receive a postcard with your next transmission date.    Any Other Special Instructions Will Be Listed Below (If Applicable).     If you need a refill on your cardiac medications before your next appointment, please call your pharmacy.

## 2016-05-19 NOTE — Progress Notes (Signed)
HPI Mr. Doepke returns today for followup. He is a very pleasant 82 year old man with chronic systolic heart failure, class II, and ischemic cardiomyopathy status post MI, status post ICD implantation. In the interim, the patient has done well. Previously, he had difficulty with obesity. He has lost weight and managed to keep it off. He states that he is exercising and dieting. He denies chest pain, shortness of breath, or syncope. He has chronic dyspnea. Allergies  Allergen Reactions  . Sulfa Antibiotics     Hyper   . Prednisone Rash     Current Outpatient Prescriptions  Medication Sig Dispense Refill  . ACCU-CHEK AVIVA PLUS test strip     . aspirin 81 MG tablet Take 81 mg by mouth daily.    Marland Kitchen atorvastatin (LIPITOR) 40 MG tablet Take 1 tablet by mouth Daily.    . fexofenadine (ALLEGRA) 180 MG tablet Take 180 mg by mouth daily.      Marland Kitchen FLOVENT HFA 110 MCG/ACT inhaler Inhale 2 puffs into the lungs 2 (two) times daily.  3  . fluticasone (FLONASE) 50 MCG/ACT nasal spray 2 sprays by Nasal route daily.      . furosemide (LASIX) 40 MG tablet Take 40 mg by mouth 4 (four) times daily.     Marland Kitchen levothyroxine (SYNTHROID, LEVOTHROID) 175 MCG tablet Take 175 mcg by mouth daily before breakfast.    . losartan (COZAAR) 100 MG tablet Take 100 mg by mouth daily.  3  . metoprolol (TOPROL-XL) 200 MG 24 hr tablet Take 1 tablet by mouth Daily.    Marland Kitchen omeprazole (PRILOSEC) 40 MG capsule Take 40 mg by mouth daily.    Marland Kitchen QVAR 80 MCG/ACT inhaler Take 2 puffs by mouth at bedtime.     No current facility-administered medications for this visit.      Past Medical History:  Diagnosis Date  . Atrial flutter ([place])    (I could not find documentation of this rhythm.)  . AUTOMATIC IMPLANTABLE CARDIAC DEFIBRILLATOR SITU   . CHF CONGESTIVE HEART FAILURE    45% by echo 2015  . DM2 (diabetes mellitus, type 2) ([place])   . DYSLIPIDEMIA   . DYSPNEA   . Edema   . HYPERTENSION   . HYPOTHYROIDISM   . MYOCARDIAL  INFARCTION    MI 1999, stent x 2 to RCA, 70% circ, 30 and 40 % LADs  . WEIGHT GAIN, ABNORMAL     ROS:   All systems reviewed and negative except as noted in the HPI.   Past Surgical History:  Procedure Laterality Date  . ANGIOPLASTY     stent  . APPENDECTOMY  1957  . CARDIAC DEFIBRILLATOR PLACEMENT    . CAROTID STENT    . EYE SURGERY  1985     Family History  Problem Relation Age of Onset  . Other Mother     natural causes  . Kidney disease Father   . Cancer Brother     throat cancer     Social History   Social History  . Marital status: Married    Spouse name: N/A  . Number of children: N/A  . Years of education: N/A   Occupational History  . Not on file.   Social History Main Topics  . Smoking status: Former Smoker    Quit date: 12/10/1983  . Smokeless tobacco: Never Used     Comment: quit 1985  . Alcohol use No  . Drug use: No  . Sexual activity: Not  on file   Other Topics Concern  . Not on file   Social History Narrative  . No narrative on file     BP (!) 148/66   Pulse 60   Ht 5\' 9"  (1.753 m)   Wt 184 lb 9.6 oz (83.7 kg)   BMI 27.26 kg/m   Physical Exam:  Well appearing 82 year old man,NAD HEENT: Unremarkable Neck:  7 cm JVD, no thyromegally Back:  No CVA tenderness Lungs:  Clear with no wheezes, rales, or rhonchi. HEART:  Regular rate rhythm, no murmurs, no rubs, no clicks Abd:  soft, positive bowel sounds, no organomegally, no rebound, no guarding Ext:  2 plus pulses, 1+ edema, no cyanosis, no clubbing Skin:  No rashes no nodules Neuro:  CN II through XII intact, motor grossly intact  DEVICE  Normal device function.  See PaceArt for details.    Assess/Plan: 1. Chronic systolic heart failure - he has some peripheral edema. His symptoms are well controlled. 2. ICD - his [employer] device is working normally. He will continue his followup in several months. 9 yrs of longevity 3. CAD - he denies anginal symptoms. Will  follow.  Mikle Bosworth.D.

## 2016-05-26 ENCOUNTER — Telehealth: Payer: Self-pay | Admitting: Internal Medicine

## 2016-05-26 NOTE — Telephone Encounter (Signed)
Called, spoke with pt. Pt needs clearance for cateract surgery on left eye scheduled for 06/02/16. Surgery being performed at  [facility] by Dr. Ander Slade. Will forward to Dr. [name] to advise.

## 2016-05-26 NOTE — Telephone Encounter (Signed)
New Message  Pt voiced had activity on defib during previous skin surgery .  Pt voiced he forgot to mention to MD-Taylor of his upcoming procedure and wanted to let the doctor know.  Please f/u with pt if needed

## 2016-05-28 NOTE — Telephone Encounter (Signed)
Faxed clearance to [facility]. Dr. [name] stated "May proceed with surgery. Low risk".

## 2016-06-02 DIAGNOSIS — H25012 Cortical age-related cataract, left eye: Secondary | ICD-10-CM | POA: Diagnosis not present

## 2016-06-02 DIAGNOSIS — E785 Hyperlipidemia, unspecified: Secondary | ICD-10-CM | POA: Diagnosis not present

## 2016-06-02 DIAGNOSIS — I2510 Atherosclerotic heart disease of native coronary artery without angina pectoris: Secondary | ICD-10-CM | POA: Diagnosis not present

## 2016-06-02 DIAGNOSIS — Z87891 Personal history of nicotine dependence: Secondary | ICD-10-CM | POA: Diagnosis not present

## 2016-06-02 DIAGNOSIS — Z955 Presence of coronary angioplasty implant and graft: Secondary | ICD-10-CM | POA: Diagnosis not present

## 2016-06-02 DIAGNOSIS — Z95810 Presence of automatic (implantable) cardiac defibrillator: Secondary | ICD-10-CM | POA: Diagnosis not present

## 2016-06-02 DIAGNOSIS — E119 Type 2 diabetes mellitus without complications: Secondary | ICD-10-CM | POA: Diagnosis not present

## 2016-06-02 DIAGNOSIS — Z882 Allergy status to sulfonamides status: Secondary | ICD-10-CM | POA: Diagnosis not present

## 2016-06-02 DIAGNOSIS — I509 Heart failure, unspecified: Secondary | ICD-10-CM | POA: Diagnosis not present

## 2016-06-02 DIAGNOSIS — Z7982 Long term (current) use of aspirin: Secondary | ICD-10-CM | POA: Diagnosis not present

## 2016-06-02 DIAGNOSIS — Z888 Allergy status to other drugs, medicaments and biological substances status: Secondary | ICD-10-CM | POA: Diagnosis not present

## 2016-06-02 DIAGNOSIS — I252 Old myocardial infarction: Secondary | ICD-10-CM | POA: Diagnosis not present

## 2016-06-02 DIAGNOSIS — I110 Hypertensive heart disease with heart failure: Secondary | ICD-10-CM | POA: Diagnosis not present

## 2016-06-02 DIAGNOSIS — K219 Gastro-esophageal reflux disease without esophagitis: Secondary | ICD-10-CM | POA: Diagnosis not present

## 2016-06-02 DIAGNOSIS — E039 Hypothyroidism, unspecified: Secondary | ICD-10-CM | POA: Diagnosis not present

## 2016-06-02 DIAGNOSIS — H2512 Age-related nuclear cataract, left eye: Secondary | ICD-10-CM | POA: Diagnosis not present

## 2016-06-03 DIAGNOSIS — Z9849 Cataract extraction status, unspecified eye: Secondary | ICD-10-CM | POA: Insufficient documentation

## 2016-06-10 DIAGNOSIS — H25812 Combined forms of age-related cataract, left eye: Secondary | ICD-10-CM | POA: Diagnosis not present

## 2016-06-26 DIAGNOSIS — L570 Actinic keratosis: Secondary | ICD-10-CM | POA: Diagnosis not present

## 2016-06-26 DIAGNOSIS — C44329 Squamous cell carcinoma of skin of other parts of face: Secondary | ICD-10-CM | POA: Diagnosis not present

## 2016-07-03 DIAGNOSIS — I10 Essential (primary) hypertension: Secondary | ICD-10-CM | POA: Diagnosis not present

## 2016-07-03 DIAGNOSIS — K210 Gastro-esophageal reflux disease with esophagitis: Secondary | ICD-10-CM | POA: Diagnosis not present

## 2016-07-03 DIAGNOSIS — E063 Autoimmune thyroiditis: Secondary | ICD-10-CM | POA: Diagnosis not present

## 2016-07-03 DIAGNOSIS — E119 Type 2 diabetes mellitus without complications: Secondary | ICD-10-CM | POA: Diagnosis not present

## 2016-07-03 DIAGNOSIS — I6529 Occlusion and stenosis of unspecified carotid artery: Secondary | ICD-10-CM | POA: Diagnosis not present

## 2016-07-17 DIAGNOSIS — E063 Autoimmune thyroiditis: Secondary | ICD-10-CM | POA: Diagnosis not present

## 2016-07-17 DIAGNOSIS — I509 Heart failure, unspecified: Secondary | ICD-10-CM | POA: Diagnosis not present

## 2016-07-17 DIAGNOSIS — I10 Essential (primary) hypertension: Secondary | ICD-10-CM | POA: Diagnosis not present

## 2016-07-21 DIAGNOSIS — Z79899 Other long term (current) drug therapy: Secondary | ICD-10-CM | POA: Diagnosis not present

## 2016-07-21 DIAGNOSIS — E039 Hypothyroidism, unspecified: Secondary | ICD-10-CM | POA: Diagnosis not present

## 2016-07-21 DIAGNOSIS — E119 Type 2 diabetes mellitus without complications: Secondary | ICD-10-CM | POA: Diagnosis not present

## 2016-07-21 DIAGNOSIS — I2510 Atherosclerotic heart disease of native coronary artery without angina pectoris: Secondary | ICD-10-CM | POA: Diagnosis not present

## 2016-07-21 DIAGNOSIS — Z961 Presence of intraocular lens: Secondary | ICD-10-CM | POA: Diagnosis not present

## 2016-07-21 DIAGNOSIS — Z882 Allergy status to sulfonamides status: Secondary | ICD-10-CM | POA: Diagnosis not present

## 2016-07-21 DIAGNOSIS — Z955 Presence of coronary angioplasty implant and graft: Secondary | ICD-10-CM | POA: Diagnosis not present

## 2016-07-21 DIAGNOSIS — I110 Hypertensive heart disease with heart failure: Secondary | ICD-10-CM | POA: Diagnosis not present

## 2016-07-21 DIAGNOSIS — Z87891 Personal history of nicotine dependence: Secondary | ICD-10-CM | POA: Diagnosis not present

## 2016-07-21 DIAGNOSIS — I252 Old myocardial infarction: Secondary | ICD-10-CM | POA: Diagnosis not present

## 2016-07-21 DIAGNOSIS — E785 Hyperlipidemia, unspecified: Secondary | ICD-10-CM | POA: Diagnosis not present

## 2016-07-21 DIAGNOSIS — Z7982 Long term (current) use of aspirin: Secondary | ICD-10-CM | POA: Diagnosis not present

## 2016-07-21 DIAGNOSIS — K219 Gastro-esophageal reflux disease without esophagitis: Secondary | ICD-10-CM | POA: Diagnosis not present

## 2016-07-21 DIAGNOSIS — H2511 Age-related nuclear cataract, right eye: Secondary | ICD-10-CM | POA: Diagnosis not present

## 2016-07-21 DIAGNOSIS — Z95810 Presence of automatic (implantable) cardiac defibrillator: Secondary | ICD-10-CM | POA: Diagnosis not present

## 2016-07-21 DIAGNOSIS — I5030 Unspecified diastolic (congestive) heart failure: Secondary | ICD-10-CM | POA: Diagnosis not present

## 2016-07-21 DIAGNOSIS — H25011 Cortical age-related cataract, right eye: Secondary | ICD-10-CM | POA: Diagnosis not present

## 2016-07-22 DIAGNOSIS — Z961 Presence of intraocular lens: Secondary | ICD-10-CM | POA: Insufficient documentation

## 2016-07-31 DIAGNOSIS — H25811 Combined forms of age-related cataract, right eye: Secondary | ICD-10-CM | POA: Diagnosis not present

## 2016-08-18 ENCOUNTER — Ambulatory Visit (INDEPENDENT_AMBULATORY_CARE_PROVIDER_SITE_OTHER): Payer: Medicare Other | Admitting: *Deleted

## 2016-08-18 DIAGNOSIS — I255 Ischemic cardiomyopathy: Secondary | ICD-10-CM | POA: Diagnosis not present

## 2016-08-18 NOTE — Progress Notes (Signed)
Remote ICD transmission.   

## 2016-08-19 ENCOUNTER — Encounter: Payer: Self-pay | Admitting: Cardiology

## 2016-08-20 LAB — CUP PACEART REMOTE DEVICE CHECK
Battery Remaining Percentage: 100 %
Date Time Interrogation Session: 20180320092600
HighPow Impedance: 61 Ohm
Implantable Lead Implant Date: 20020206
Implantable Lead Location: 753860
Implantable Lead Location: 753860
Implantable Lead Serial Number: 109512
Implantable Pulse Generator Implant Date: 20120330
Lead Channel Impedance Value: 489 Ohm
Lead Channel Pacing Threshold Amplitude: 1.6 V
Lead Channel Pacing Threshold Pulse Width: 1 ms
Lead Channel Setting Pacing Amplitude: 3 V
Lead Channel Setting Pacing Pulse Width: 1 ms
Lead Channel Setting Sensing Sensitivity: 0.5 mV
MDC IDC LEAD IMPLANT DT: 20071005
MDC IDC LEAD SERIAL: 224392
MDC IDC MSMT BATTERY REMAINING LONGEVITY: 102 mo
MDC IDC STAT BRADY RV PERCENT PACED: 0 %
Pulse Gen Serial Number: 114258

## 2016-09-08 DIAGNOSIS — H5022 Vertical strabismus, left eye: Secondary | ICD-10-CM | POA: Diagnosis not present

## 2016-09-08 DIAGNOSIS — E0500 Thyrotoxicosis with diffuse goiter without thyrotoxic crisis or storm: Secondary | ICD-10-CM | POA: Diagnosis not present

## 2016-10-09 DIAGNOSIS — E785 Hyperlipidemia, unspecified: Secondary | ICD-10-CM | POA: Diagnosis not present

## 2016-10-09 DIAGNOSIS — I10 Essential (primary) hypertension: Secondary | ICD-10-CM | POA: Diagnosis not present

## 2016-10-09 DIAGNOSIS — D649 Anemia, unspecified: Secondary | ICD-10-CM | POA: Diagnosis not present

## 2016-10-09 DIAGNOSIS — E119 Type 2 diabetes mellitus without complications: Secondary | ICD-10-CM | POA: Diagnosis not present

## 2016-10-09 DIAGNOSIS — Z79899 Other long term (current) drug therapy: Secondary | ICD-10-CM | POA: Diagnosis not present

## 2016-10-09 DIAGNOSIS — I509 Heart failure, unspecified: Secondary | ICD-10-CM | POA: Diagnosis not present

## 2016-10-09 DIAGNOSIS — E063 Autoimmune thyroiditis: Secondary | ICD-10-CM | POA: Diagnosis not present

## 2016-10-16 DIAGNOSIS — L821 Other seborrheic keratosis: Secondary | ICD-10-CM | POA: Diagnosis not present

## 2016-10-16 DIAGNOSIS — L578 Other skin changes due to chronic exposure to nonionizing radiation: Secondary | ICD-10-CM | POA: Diagnosis not present

## 2016-10-16 DIAGNOSIS — C4442 Squamous cell carcinoma of skin of scalp and neck: Secondary | ICD-10-CM | POA: Diagnosis not present

## 2016-10-16 DIAGNOSIS — C44329 Squamous cell carcinoma of skin of other parts of face: Secondary | ICD-10-CM | POA: Diagnosis not present

## 2016-10-16 DIAGNOSIS — L570 Actinic keratosis: Secondary | ICD-10-CM | POA: Diagnosis not present

## 2016-11-17 ENCOUNTER — Ambulatory Visit (INDEPENDENT_AMBULATORY_CARE_PROVIDER_SITE_OTHER): Payer: Medicare Other | Admitting: *Deleted

## 2016-11-17 DIAGNOSIS — I255 Ischemic cardiomyopathy: Secondary | ICD-10-CM

## 2016-11-17 LAB — CUP PACEART REMOTE DEVICE CHECK
Date Time Interrogation Session: 20180619085900
HighPow Impedance: 62 Ohm
Implantable Lead Implant Date: 20020206
Implantable Lead Model: 148
Implantable Lead Model: 4088
Implantable Lead Serial Number: 109512
Lead Channel Impedance Value: 492 Ohm
Lead Channel Pacing Threshold Amplitude: 1.6 V
Lead Channel Setting Pacing Amplitude: 3 V
Lead Channel Setting Sensing Sensitivity: 0.5 mV
MDC IDC LEAD IMPLANT DT: 20071005
MDC IDC LEAD LOCATION: 753860
MDC IDC LEAD LOCATION: 753860
MDC IDC LEAD SERIAL: 224392
MDC IDC MSMT BATTERY REMAINING LONGEVITY: 102 mo
MDC IDC MSMT BATTERY REMAINING PERCENTAGE: 100 %
MDC IDC MSMT LEADCHNL RV PACING THRESHOLD PULSEWIDTH: 1 ms
MDC IDC PG IMPLANT DT: 20120330
MDC IDC SET LEADCHNL RV PACING PULSEWIDTH: 1 ms
MDC IDC STAT BRADY RV PERCENT PACED: 0 %
Pulse Gen Serial Number: 114258

## 2016-11-17 NOTE — Progress Notes (Signed)
Remote ICD transmission.   

## 2016-11-18 DIAGNOSIS — H527 Unspecified disorder of refraction: Secondary | ICD-10-CM | POA: Diagnosis not present

## 2016-11-18 DIAGNOSIS — E119 Type 2 diabetes mellitus without complications: Secondary | ICD-10-CM | POA: Diagnosis not present

## 2016-11-20 ENCOUNTER — Encounter: Payer: Self-pay | Admitting: Cardiology

## 2016-12-16 DIAGNOSIS — H01004 Unspecified blepharitis left upper eyelid: Secondary | ICD-10-CM | POA: Diagnosis not present

## 2016-12-16 DIAGNOSIS — H5021 Vertical strabismus, right eye: Secondary | ICD-10-CM | POA: Insufficient documentation

## 2016-12-16 DIAGNOSIS — E0500 Thyrotoxicosis with diffuse goiter without thyrotoxic crisis or storm: Secondary | ICD-10-CM | POA: Diagnosis not present

## 2016-12-16 DIAGNOSIS — H532 Diplopia: Secondary | ICD-10-CM | POA: Diagnosis not present

## 2016-12-16 DIAGNOSIS — H0100A Unspecified blepharitis right eye, upper and lower eyelids: Secondary | ICD-10-CM | POA: Insufficient documentation

## 2016-12-16 DIAGNOSIS — H01001 Unspecified blepharitis right upper eyelid: Secondary | ICD-10-CM | POA: Diagnosis not present

## 2016-12-16 DIAGNOSIS — H01005 Unspecified blepharitis left lower eyelid: Secondary | ICD-10-CM | POA: Diagnosis not present

## 2016-12-16 DIAGNOSIS — H0100B Unspecified blepharitis left eye, upper and lower eyelids: Secondary | ICD-10-CM

## 2017-01-04 DIAGNOSIS — Z888 Allergy status to other drugs, medicaments and biological substances status: Secondary | ICD-10-CM | POA: Diagnosis not present

## 2017-01-04 DIAGNOSIS — E785 Hyperlipidemia, unspecified: Secondary | ICD-10-CM | POA: Diagnosis not present

## 2017-01-04 DIAGNOSIS — S61411A Laceration without foreign body of right hand, initial encounter: Secondary | ICD-10-CM | POA: Diagnosis not present

## 2017-01-04 DIAGNOSIS — S61011A Laceration without foreign body of right thumb without damage to nail, initial encounter: Secondary | ICD-10-CM | POA: Diagnosis not present

## 2017-01-04 DIAGNOSIS — S60221A Contusion of right hand, initial encounter: Secondary | ICD-10-CM | POA: Diagnosis not present

## 2017-01-04 DIAGNOSIS — Z955 Presence of coronary angioplasty implant and graft: Secondary | ICD-10-CM | POA: Diagnosis not present

## 2017-01-04 DIAGNOSIS — I10 Essential (primary) hypertension: Secondary | ICD-10-CM | POA: Diagnosis not present

## 2017-01-04 DIAGNOSIS — E039 Hypothyroidism, unspecified: Secondary | ICD-10-CM | POA: Diagnosis not present

## 2017-01-04 DIAGNOSIS — E119 Type 2 diabetes mellitus without complications: Secondary | ICD-10-CM | POA: Diagnosis not present

## 2017-01-04 DIAGNOSIS — Z882 Allergy status to sulfonamides status: Secondary | ICD-10-CM | POA: Diagnosis not present

## 2017-01-04 DIAGNOSIS — Z7982 Long term (current) use of aspirin: Secondary | ICD-10-CM | POA: Diagnosis not present

## 2017-01-04 DIAGNOSIS — I2510 Atherosclerotic heart disease of native coronary artery without angina pectoris: Secondary | ICD-10-CM | POA: Diagnosis not present

## 2017-01-12 DIAGNOSIS — E785 Hyperlipidemia, unspecified: Secondary | ICD-10-CM | POA: Diagnosis not present

## 2017-01-12 DIAGNOSIS — E063 Autoimmune thyroiditis: Secondary | ICD-10-CM | POA: Diagnosis not present

## 2017-01-12 DIAGNOSIS — E119 Type 2 diabetes mellitus without complications: Secondary | ICD-10-CM | POA: Diagnosis not present

## 2017-01-12 DIAGNOSIS — J449 Chronic obstructive pulmonary disease, unspecified: Secondary | ICD-10-CM | POA: Diagnosis not present

## 2017-01-12 DIAGNOSIS — I10 Essential (primary) hypertension: Secondary | ICD-10-CM | POA: Diagnosis not present

## 2017-01-12 DIAGNOSIS — I509 Heart failure, unspecified: Secondary | ICD-10-CM | POA: Diagnosis not present

## 2017-01-19 DIAGNOSIS — Z9181 History of falling: Secondary | ICD-10-CM | POA: Diagnosis not present

## 2017-01-19 DIAGNOSIS — Z1389 Encounter for screening for other disorder: Secondary | ICD-10-CM | POA: Diagnosis not present

## 2017-01-19 DIAGNOSIS — S61419A Laceration without foreign body of unspecified hand, initial encounter: Secondary | ICD-10-CM | POA: Diagnosis not present

## 2017-02-03 DIAGNOSIS — S61419A Laceration without foreign body of unspecified hand, initial encounter: Secondary | ICD-10-CM | POA: Diagnosis not present

## 2017-02-16 ENCOUNTER — Ambulatory Visit (INDEPENDENT_AMBULATORY_CARE_PROVIDER_SITE_OTHER): Payer: Medicare Other | Admitting: *Deleted

## 2017-02-16 DIAGNOSIS — I255 Ischemic cardiomyopathy: Secondary | ICD-10-CM

## 2017-02-16 NOTE — Progress Notes (Signed)
Remote ICD transmission.   

## 2017-02-18 ENCOUNTER — Encounter: Payer: Self-pay | Admitting: Cardiology

## 2017-03-08 DIAGNOSIS — E0500 Thyrotoxicosis with diffuse goiter without thyrotoxic crisis or storm: Secondary | ICD-10-CM | POA: Diagnosis not present

## 2017-03-08 DIAGNOSIS — H0100A Unspecified blepharitis right eye, upper and lower eyelids: Secondary | ICD-10-CM | POA: Diagnosis not present

## 2017-03-08 DIAGNOSIS — Z9841 Cataract extraction status, right eye: Secondary | ICD-10-CM | POA: Diagnosis not present

## 2017-03-08 DIAGNOSIS — E063 Autoimmune thyroiditis: Secondary | ICD-10-CM | POA: Insufficient documentation

## 2017-03-08 DIAGNOSIS — I255 Ischemic cardiomyopathy: Secondary | ICD-10-CM | POA: Insufficient documentation

## 2017-03-08 DIAGNOSIS — I2510 Atherosclerotic heart disease of native coronary artery without angina pectoris: Secondary | ICD-10-CM | POA: Insufficient documentation

## 2017-03-08 DIAGNOSIS — H532 Diplopia: Secondary | ICD-10-CM | POA: Diagnosis not present

## 2017-03-08 DIAGNOSIS — I10 Essential (primary) hypertension: Secondary | ICD-10-CM | POA: Diagnosis not present

## 2017-03-08 DIAGNOSIS — Z961 Presence of intraocular lens: Secondary | ICD-10-CM | POA: Diagnosis not present

## 2017-03-08 DIAGNOSIS — H0100B Unspecified blepharitis left eye, upper and lower eyelids: Secondary | ICD-10-CM | POA: Diagnosis not present

## 2017-03-08 DIAGNOSIS — Z95810 Presence of automatic (implantable) cardiac defibrillator: Secondary | ICD-10-CM | POA: Diagnosis not present

## 2017-03-08 DIAGNOSIS — Z9842 Cataract extraction status, left eye: Secondary | ICD-10-CM | POA: Diagnosis not present

## 2017-03-08 DIAGNOSIS — H5021 Vertical strabismus, right eye: Secondary | ICD-10-CM | POA: Diagnosis not present

## 2017-03-15 DIAGNOSIS — H5021 Vertical strabismus, right eye: Secondary | ICD-10-CM | POA: Diagnosis not present

## 2017-03-15 DIAGNOSIS — I358 Other nonrheumatic aortic valve disorders: Secondary | ICD-10-CM | POA: Diagnosis not present

## 2017-03-15 DIAGNOSIS — M5136 Other intervertebral disc degeneration, lumbar region: Secondary | ICD-10-CM | POA: Diagnosis not present

## 2017-03-15 DIAGNOSIS — H0100B Unspecified blepharitis left eye, upper and lower eyelids: Secondary | ICD-10-CM | POA: Diagnosis not present

## 2017-03-15 DIAGNOSIS — I509 Heart failure, unspecified: Secondary | ICD-10-CM | POA: Diagnosis not present

## 2017-03-15 DIAGNOSIS — I447 Left bundle-branch block, unspecified: Secondary | ICD-10-CM | POA: Diagnosis not present

## 2017-03-15 DIAGNOSIS — E785 Hyperlipidemia, unspecified: Secondary | ICD-10-CM | POA: Diagnosis not present

## 2017-03-15 DIAGNOSIS — Z87891 Personal history of nicotine dependence: Secondary | ICD-10-CM | POA: Diagnosis not present

## 2017-03-15 DIAGNOSIS — Z9889 Other specified postprocedural states: Secondary | ICD-10-CM | POA: Insufficient documentation

## 2017-03-15 DIAGNOSIS — E119 Type 2 diabetes mellitus without complications: Secondary | ICD-10-CM | POA: Diagnosis not present

## 2017-03-15 DIAGNOSIS — I2510 Atherosclerotic heart disease of native coronary artery without angina pectoris: Secondary | ICD-10-CM | POA: Diagnosis not present

## 2017-03-15 DIAGNOSIS — I252 Old myocardial infarction: Secondary | ICD-10-CM | POA: Diagnosis not present

## 2017-03-15 DIAGNOSIS — J45909 Unspecified asthma, uncomplicated: Secondary | ICD-10-CM | POA: Diagnosis not present

## 2017-03-15 DIAGNOSIS — Z955 Presence of coronary angioplasty implant and graft: Secondary | ICD-10-CM | POA: Diagnosis not present

## 2017-03-15 DIAGNOSIS — M48061 Spinal stenosis, lumbar region without neurogenic claudication: Secondary | ICD-10-CM | POA: Diagnosis not present

## 2017-03-15 DIAGNOSIS — I110 Hypertensive heart disease with heart failure: Secondary | ICD-10-CM | POA: Diagnosis not present

## 2017-03-15 DIAGNOSIS — Z95810 Presence of automatic (implantable) cardiac defibrillator: Secondary | ICD-10-CM | POA: Diagnosis not present

## 2017-03-15 DIAGNOSIS — I255 Ischemic cardiomyopathy: Secondary | ICD-10-CM | POA: Diagnosis not present

## 2017-03-15 DIAGNOSIS — Z79899 Other long term (current) drug therapy: Secondary | ICD-10-CM | POA: Diagnosis not present

## 2017-03-15 DIAGNOSIS — E890 Postprocedural hypothyroidism: Secondary | ICD-10-CM | POA: Diagnosis not present

## 2017-03-15 DIAGNOSIS — H532 Diplopia: Secondary | ICD-10-CM | POA: Diagnosis not present

## 2017-03-15 DIAGNOSIS — H50331 Intermittent monocular exotropia, right eye: Secondary | ICD-10-CM | POA: Diagnosis not present

## 2017-03-15 DIAGNOSIS — Z961 Presence of intraocular lens: Secondary | ICD-10-CM | POA: Diagnosis not present

## 2017-03-15 DIAGNOSIS — Z85828 Personal history of other malignant neoplasm of skin: Secondary | ICD-10-CM | POA: Diagnosis not present

## 2017-03-15 DIAGNOSIS — E0500 Thyrotoxicosis with diffuse goiter without thyrotoxic crisis or storm: Secondary | ICD-10-CM | POA: Diagnosis not present

## 2017-03-15 DIAGNOSIS — K219 Gastro-esophageal reflux disease without esophagitis: Secondary | ICD-10-CM | POA: Diagnosis not present

## 2017-03-16 LAB — CUP PACEART REMOTE DEVICE CHECK
Battery Remaining Percentage: 100 %
Brady Statistic RV Percent Paced: 1 %
Date Time Interrogation Session: 20180916234800
HIGH POWER IMPEDANCE MEASURED VALUE: 66 Ohm
Implantable Lead Implant Date: 20071005
Implantable Lead Location: 753860
Implantable Lead Model: 148
Implantable Lead Serial Number: 109512
Implantable Lead Serial Number: 224392
Implantable Pulse Generator Implant Date: 20120330
Lead Channel Impedance Value: 558 Ohm
Lead Channel Pacing Threshold Amplitude: 1.6 V
Lead Channel Setting Pacing Amplitude: 3 V
Lead Channel Setting Pacing Pulse Width: 1 ms
MDC IDC LEAD IMPLANT DT: 20020206
MDC IDC LEAD LOCATION: 753860
MDC IDC MSMT BATTERY REMAINING LONGEVITY: 96 mo
MDC IDC MSMT LEADCHNL RV PACING THRESHOLD PULSEWIDTH: 1 ms
MDC IDC SET LEADCHNL RV SENSING SENSITIVITY: 0.5 mV
Pulse Gen Serial Number: 114258

## 2017-03-30 DIAGNOSIS — H6983 Other specified disorders of Eustachian tube, bilateral: Secondary | ICD-10-CM | POA: Diagnosis not present

## 2017-03-30 DIAGNOSIS — Z9109 Other allergy status, other than to drugs and biological substances: Secondary | ICD-10-CM | POA: Diagnosis not present

## 2017-03-30 DIAGNOSIS — H918X2 Other specified hearing loss, left ear: Secondary | ICD-10-CM | POA: Diagnosis not present

## 2017-03-31 ENCOUNTER — Encounter: Payer: Self-pay | Admitting: Internal Medicine

## 2017-03-31 ENCOUNTER — Ambulatory Visit (INDEPENDENT_AMBULATORY_CARE_PROVIDER_SITE_OTHER): Payer: Medicare Other | Admitting: Internal Medicine

## 2017-03-31 VITALS — BP 140/60 | HR 52 | Ht 67.0 in | Wt 178.0 lb

## 2017-03-31 DIAGNOSIS — I255 Ischemic cardiomyopathy: Secondary | ICD-10-CM | POA: Diagnosis not present

## 2017-03-31 DIAGNOSIS — I5022 Chronic systolic (congestive) heart failure: Secondary | ICD-10-CM

## 2017-03-31 DIAGNOSIS — I472 Ventricular tachycardia, unspecified: Secondary | ICD-10-CM

## 2017-03-31 DIAGNOSIS — Z95810 Presence of automatic (implantable) cardiac defibrillator: Secondary | ICD-10-CM | POA: Diagnosis not present

## 2017-03-31 NOTE — Progress Notes (Signed)
HPI [name] returns today for ongoing evaluation and management of chronic systolic heart failure, and ischemic cardiomyopathy, status post ICD implantation. He has a long history of obesity but has lost weight in the past. He has a h/o VT and is s/p ICD insertion in 2002. He has done well in the interim. He remains active. No chest pain, sob, or syncope. Allergies  Allergen Reactions  . Prednisone Rash  . Sulfa Antibiotics     Hyper      Current Outpatient Prescriptions  Medication Sig Dispense Refill  . aspirin 81 MG tablet Take 81 mg by mouth daily.    Marland Kitchen atorvastatin (LIPITOR) 40 MG tablet Take 1 tablet by mouth Daily.    . fexofenadine (ALLEGRA) 180 MG tablet Take 180 mg by mouth daily.      Marland Kitchen FLOVENT HFA 110 MCG/ACT inhaler Inhale 2 puffs into the lungs 2 (two) times daily.  3  . fluticasone (FLONASE) 50 MCG/ACT nasal spray 2 sprays by Nasal route daily.      . furosemide (LASIX) 40 MG tablet Take 40 mg by mouth 4 (four) times daily.     Marland Kitchen levothyroxine (SYNTHROID, LEVOTHROID) 175 MCG tablet Take 175 mcg by mouth daily before breakfast.    . losartan (COZAAR) 100 MG tablet Take 100 mg by mouth daily.  3  . metoprolol (TOPROL-XL) 200 MG 24 hr tablet Take 1 tablet by mouth Daily.    Marland Kitchen omeprazole (PRILOSEC) 40 MG capsule Take 40 mg by mouth daily.    Marland Kitchen QVAR 80 MCG/ACT inhaler Take 2 puffs by mouth at bedtime.    Marland Kitchen ACCU-CHEK AVIVA PLUS test strip      No current facility-administered medications for this visit.      Past Medical History:  Diagnosis Date  . Atrial flutter ([place])    (I could not find documentation of this rhythm.)  . AUTOMATIC IMPLANTABLE CARDIAC DEFIBRILLATOR SITU   . CHF CONGESTIVE HEART FAILURE    45% by echo 2015  . DM2 (diabetes mellitus, type 2) ([place])   . DYSLIPIDEMIA   . DYSPNEA   . Edema   . HYPERTENSION   . HYPOTHYROIDISM   . MYOCARDIAL INFARCTION    MI 1999, stent x 2 to RCA, 70% circ, 30 and 40 % LADs  . WEIGHT GAIN, ABNORMAL      ROS:   All systems reviewed and negative except as noted in the HPI.   Past Surgical History:  Procedure Laterality Date  . ANGIOPLASTY     stent  . APPENDECTOMY  1957  . CARDIAC DEFIBRILLATOR PLACEMENT    . CAROTID STENT    . EYE SURGERY  1985     Family History  Problem Relation Age of Onset  . Other Mother        natural causes  . Kidney disease Father   . Cancer Brother        throat cancer     Social History   Social History  . Marital status: Married    Spouse name: N/A  . Number of children: N/A  . Years of education: N/A   Occupational History  . Not on file.   Social History Main Topics  . Smoking status: Former Smoker    Quit date: 12/10/1983  . Smokeless tobacco: Never Used     Comment: quit 1985  . Alcohol use No  . Drug use: No  . Sexual activity: Not on file   Other Topics  Concern  . Not on file   Social History Narrative  . No narrative on file     BP 140/60   Pulse (!) 52   Ht 5\' 7"  (1.702 m)   Wt 178 lb (80.7 kg)   BMI 27.88 kg/m   Physical Exam:  Well appearing 83 yo man, NAD HEENT: Unremarkable Neck:  6 cm JVD, no thyromegally Lymphatics:  No adenopathy Back:  No CVA tenderness Lungs:  Clear with no wheezes HEART:  Regular rate rhythm, no murmurs, no rubs, no clicks Abd:  soft, positive bowel sounds, no organomegally, no rebound, no guarding Ext:  2 plus pulses, no edema, no cyanosis, no clubbing Skin:  No rashes no nodules Neuro:  CN II through XII intact, motor grossly intact   DEVICE  Normal device function.  See PaceArt for details.   Assess/Plan: 1. Chronic systolic heart failure - his symptoms remain class 2. He will continue his current meds. 2. ICD - his [employer] device is working normally. His RV threshold is up a bit. He has about 8 more years of battery longevity.  3. VT - he has had no recurrent sustained VT since his last visit.  [name].D.

## 2017-03-31 NOTE — Patient Instructions (Signed)
Medication Instructions:  Your physician recommends that you continue on your current medications as directed. Please refer to the Current Medication list given to you today.  Labwork: None ordered.  Testing/Procedures: None ordered.  Follow-Up: Your physician wants you to follow-up in: one year with Dr. [name].   You will receive a reminder letter in the mail two months in advance. If you don't receive a letter, please call our office to schedule the follow-up appointment.  Remote monitoring is used to monitor your ICD from home. This monitoring reduces the number of office visits required to check your device to one time per year. It allows [place] to keep an eye on the functioning of your device to ensure it is working properly. You are scheduled for a device check from home on 05/18/2017. You may send your transmission at any time that day. If you have a wireless device, the transmission will be sent automatically. After your physician reviews your transmission, you will receive a postcard with your next transmission date.    Any Other Special Instructions Will Be Listed Below (If Applicable).     If you need a refill on your cardiac medications before your next appointment, please call your pharmacy.

## 2017-04-05 LAB — CUP PACEART INCLINIC DEVICE CHECK
HighPow Impedance: 53 Ohm
HighPow Impedance: 65 Ohm
Implantable Lead Implant Date: 20071005
Implantable Lead Location: 753860
Implantable Lead Location: 753860
Implantable Lead Model: 4088
Lead Channel Pacing Threshold Pulse Width: 1 ms
Lead Channel Setting Pacing Amplitude: 3 V
Lead Channel Setting Pacing Pulse Width: 1 ms
Lead Channel Setting Sensing Sensitivity: 0.5 mV
MDC IDC LEAD IMPLANT DT: 20020206
MDC IDC LEAD SERIAL: 109512
MDC IDC LEAD SERIAL: 224392
MDC IDC MSMT LEADCHNL RV IMPEDANCE VALUE: 508 Ohm
MDC IDC MSMT LEADCHNL RV PACING THRESHOLD AMPLITUDE: 2.2 V
MDC IDC MSMT LEADCHNL RV SENSING INTR AMPL: 16.2 mV
MDC IDC PG IMPLANT DT: 20120330
MDC IDC PG SERIAL: 114258
MDC IDC SESS DTM: 20181031040000

## 2017-04-21 DIAGNOSIS — Z23 Encounter for immunization: Secondary | ICD-10-CM | POA: Diagnosis not present

## 2017-04-21 DIAGNOSIS — E119 Type 2 diabetes mellitus without complications: Secondary | ICD-10-CM | POA: Diagnosis not present

## 2017-04-21 DIAGNOSIS — E785 Hyperlipidemia, unspecified: Secondary | ICD-10-CM | POA: Diagnosis not present

## 2017-04-21 DIAGNOSIS — E063 Autoimmune thyroiditis: Secondary | ICD-10-CM | POA: Diagnosis not present

## 2017-04-21 DIAGNOSIS — J449 Chronic obstructive pulmonary disease, unspecified: Secondary | ICD-10-CM | POA: Diagnosis not present

## 2017-04-21 DIAGNOSIS — I10 Essential (primary) hypertension: Secondary | ICD-10-CM | POA: Diagnosis not present

## 2017-05-02 NOTE — Progress Notes (Signed)
HPI The patient presents for followup of his coronary disease and cardiomyopathy.  Since I last saw him he has done very well.  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  The patient denies any new symptoms such as chest discomfort, neck or arm discomfort. There has been no new shortness of breath, PND or orthopnea. There have been no reported palpitations, presyncope or syncope.  He says he will get SOB climbing a couple of flights of stairs.  He has chronic lower extremity edema.     Allergies  Allergen Reactions  . Prednisone Rash  . Sulfa Antibiotics     Hyper     Current Outpatient Medications  Medication Sig Dispense Refill  . ACCU-CHEK AVIVA PLUS test strip     . aspirin 81 MG tablet Take 81 mg by mouth daily.    Marland Kitchen atorvastatin (LIPITOR) 40 MG tablet Take 1 tablet by mouth Daily.    . cyanocobalamin 1000 MCG tablet Take 1,000 mcg by mouth daily.    . fexofenadine (ALLEGRA) 180 MG tablet Take 180 mg by mouth daily.      Marland Kitchen FLOVENT HFA 110 MCG/ACT inhaler Inhale 2 puffs into the lungs 2 (two) times daily.  3  . fluticasone (FLONASE) 50 MCG/ACT nasal spray 2 sprays by Nasal route daily.      . furosemide (LASIX) 40 MG tablet Take 40 mg by mouth 4 (four) times daily.     Marland Kitchen levothyroxine (SYNTHROID, LEVOTHROID) 175 MCG tablet Take 175 mcg by mouth daily before breakfast.    . metoprolol (TOPROL-XL) 200 MG 24 hr tablet Take 1 tablet by mouth Daily.    . mirtazapine (REMERON) 15 MG tablet Take 15 mg by mouth daily.  3  . montelukast (SINGULAIR) 10 MG tablet Take 10 mg by mouth daily.  12  . omeprazole (PRILOSEC) 40 MG capsule Take 40 mg by mouth daily.    Marland Kitchen QVAR 80 MCG/ACT inhaler Take 2 puffs by mouth at bedtime.    . sacubitril-valsartan (ENTRESTO) 49-51 MG Take 1 tablet by mouth 2 (two) times daily. 60 tablet 11   No current facility-administered medications for this visit.     Past Medical History:  Diagnosis Date  . Atrial flutter ([place])    (I  could not find documentation of this rhythm.)  . AUTOMATIC IMPLANTABLE CARDIAC DEFIBRILLATOR SITU   . CHF CONGESTIVE HEART FAILURE    45% by echo 2015  . DM2 (diabetes mellitus, type 2) ([place])   . DYSLIPIDEMIA   . DYSPNEA   . Edema   . HYPERTENSION   . HYPOTHYROIDISM   . MYOCARDIAL INFARCTION    MI 1999, stent x 2 to RCA, 70% circ, 30 and 40 % LADs  . WEIGHT GAIN, ABNORMAL     Past Surgical History:  Procedure Laterality Date  . ANGIOPLASTY     stent  . APPENDECTOMY  1957  . CARDIAC DEFIBRILLATOR PLACEMENT    . CAROTID STENT    . EYE SURGERY  1985   ROS:   As stated in the HPI and negative for all other systems.  PHYSICAL EXAM BP (!) 166/64   Pulse 74   Ht 5\' 7"  (1.702 m)   Wt 184 lb 6.4 oz (83.6 kg)   SpO2 95%   BMI 28.88 kg/m   GENERAL:  Well appearing and looks younger than his stated age.  NECK:  No jugular venous distention, waveform within normal limits, carotid upstroke brisk  and symmetric, no bruits, no thyromegaly LUNGS:  Clear to auscultation bilaterally CHEST:  Unremarkable HEART:  PMI not displaced or sustained,S1 and S2 within normal limits, no S3, no S4, no clicks, no rubs, no murmurs ABD:  Flat, positive bowel sounds normal in frequency in pitch, no bruits, no rebound, no guarding, no midline pulsatile mass, no hepatomegaly, no splenomegaly EXT:  2 plus pulses upper and decreased DP/PT bilateral lower, moderate leg edema, no cyanosis no clubbing    EKG:  Sinus rhythm, rate 74 , left bundle branch block.   ASSESSMENT AND PLAN   CAD:  The patient has no new sypmtoms.  No further cardiovascular testing is indicated.  We will continue with aggressive risk reduction and meds as listed.   The patient has no new sypmtoms since stress testing in 2013 (large fixed scar).    CAROTID STENOSIS:   This was mild previously and no change in therapy is indicated.   HTN:  The blood pressure not at target and it has not been at target over several office readings.   Given this and the reduced EF I am gong to stop the Cozaar and start Entresto 49/51 bid.  I will see him in 1 month to titrate to bid.   DYSLIPIDEMIA:   LDL was 64 last month.  No change in therapy is indicated.   CARDIOMYOPATHY:   I will titrate the med and check an echocardiogram.    EDEMA:  This is baseline.  No change in therapy is planned.   ICD:  He is up to date with follow up and saw Dr. [name] in October.

## 2017-05-03 ENCOUNTER — Ambulatory Visit: Payer: Medicare Other | Admitting: Cardiology

## 2017-05-03 ENCOUNTER — Encounter: Payer: Self-pay | Admitting: Cardiology

## 2017-05-03 VITALS — BP 166/64 | HR 74 | Ht 67.0 in | Wt 184.4 lb

## 2017-05-03 DIAGNOSIS — I10 Essential (primary) hypertension: Secondary | ICD-10-CM

## 2017-05-03 DIAGNOSIS — I255 Ischemic cardiomyopathy: Secondary | ICD-10-CM

## 2017-05-03 DIAGNOSIS — M7989 Other specified soft tissue disorders: Secondary | ICD-10-CM | POA: Diagnosis not present

## 2017-05-03 DIAGNOSIS — E785 Hyperlipidemia, unspecified: Secondary | ICD-10-CM | POA: Diagnosis not present

## 2017-05-03 MED ORDER — SACUBITRIL-VALSARTAN 49-51 MG PO TABS: 1.0000 | ORAL_TABLET | Freq: Two times a day (BID) | ORAL | 11 refills | Status: DC

## 2017-05-03 NOTE — Patient Instructions (Signed)
Medication Instructions:  STOP-Losartan START- Entresto 49/51 mg twice a day  If you need a refill on your cardiac medications before your next appointment, please call your pharmacy.  Labwork: None ordered   Testing/Procedures: Your physician has requested that you have an echocardiogram. Echocardiography is a painless test that uses sound waves to create images of your heart. It provides your doctor with information about the size and shape of your heart and how well your heart's chambers and valves are working. This procedure takes approximately one hour. There are no restrictions for this procedure.  Follow-Up: Your physician wants you to follow-up in: 1 Month.    Thank you for choosing [facility] at [facility]!!

## 2017-05-06 ENCOUNTER — Other Ambulatory Visit: Payer: Self-pay

## 2017-05-06 ENCOUNTER — Ambulatory Visit (HOSPITAL_COMMUNITY): Payer: Medicare Other | Attending: Cardiovascular Disease

## 2017-05-06 DIAGNOSIS — I5030 Unspecified diastolic (congestive) heart failure: Secondary | ICD-10-CM | POA: Diagnosis not present

## 2017-05-06 DIAGNOSIS — I420 Dilated cardiomyopathy: Secondary | ICD-10-CM | POA: Diagnosis not present

## 2017-05-06 DIAGNOSIS — I255 Ischemic cardiomyopathy: Secondary | ICD-10-CM

## 2017-05-06 MED ORDER — PERFLUTREN LIPID MICROSPHERE
1.0000 mL | INTRAVENOUS | Status: AC | PRN
  Administered 2017-05-06: 2 mL via INTRAVENOUS

## 2017-05-14 ENCOUNTER — Telehealth: Payer: Self-pay | Admitting: Cardiology

## 2017-05-14 NOTE — Telephone Encounter (Signed)
Patient made aware of results and has verbalized his understanding. Samples have been given to make it through until his appointment on 06/17/16 with Dr. Percival Spanish  Notes recorded by [name], MD on 05/07/2017 at 1:37 AM EST His EF is lower than previous (25%). I started [facility] and will see him back to titrate this. Call Mr. Golson with the results and send results to [name]., MD  Medication Samples have been provided to the patient.  Drug name: Delene Loll        Strength: 49/51 mg         Qty: 1 box   LOT: [id number]   Exp.Date: 1/21

## 2017-05-14 NOTE — Telephone Encounter (Signed)
New message  Pt verbalized that he is returning call for the rn   He want results from last week

## 2017-05-18 ENCOUNTER — Ambulatory Visit (INDEPENDENT_AMBULATORY_CARE_PROVIDER_SITE_OTHER): Payer: Medicare Other | Admitting: *Deleted

## 2017-05-18 DIAGNOSIS — I255 Ischemic cardiomyopathy: Secondary | ICD-10-CM

## 2017-05-18 NOTE — Progress Notes (Signed)
Remote ICD transmission.   

## 2017-05-19 ENCOUNTER — Encounter: Payer: Self-pay | Admitting: Cardiology

## 2017-05-21 LAB — CUP PACEART REMOTE DEVICE CHECK
Battery Remaining Percentage: 100 %
Brady Statistic RV Percent Paced: 1 %
Date Time Interrogation Session: 20181217102900
HIGH POWER IMPEDANCE MEASURED VALUE: 68 Ohm
Implantable Lead Location: 753860
Implantable Lead Model: 148
Implantable Lead Model: 4088
Implantable Lead Serial Number: 109512
Lead Channel Impedance Value: 525 Ohm
Lead Channel Pacing Threshold Amplitude: 2.2 V
Lead Channel Setting Pacing Pulse Width: 1 ms
Lead Channel Setting Sensing Sensitivity: 0.5 mV
MDC IDC LEAD IMPLANT DT: 20020206
MDC IDC LEAD IMPLANT DT: 20071005
MDC IDC LEAD LOCATION: 753860
MDC IDC LEAD SERIAL: 224392
MDC IDC MSMT BATTERY REMAINING LONGEVITY: 96 mo
MDC IDC MSMT LEADCHNL RV PACING THRESHOLD PULSEWIDTH: 1 ms
MDC IDC PG IMPLANT DT: 20120330
MDC IDC PG SERIAL: 114258
MDC IDC SET LEADCHNL RV PACING AMPLITUDE: 3 V

## 2017-06-07 DIAGNOSIS — J449 Chronic obstructive pulmonary disease, unspecified: Secondary | ICD-10-CM | POA: Diagnosis not present

## 2017-06-07 DIAGNOSIS — J189 Pneumonia, unspecified organism: Secondary | ICD-10-CM | POA: Diagnosis not present

## 2017-06-07 DIAGNOSIS — I420 Dilated cardiomyopathy: Secondary | ICD-10-CM | POA: Diagnosis not present

## 2017-06-07 DIAGNOSIS — N183 Chronic kidney disease, stage 3 (moderate): Secondary | ICD-10-CM | POA: Diagnosis not present

## 2017-06-07 DIAGNOSIS — J301 Allergic rhinitis due to pollen: Secondary | ICD-10-CM | POA: Diagnosis not present

## 2017-06-16 NOTE — Progress Notes (Signed)
HPI The patient presents for followup of his coronary disease and cardiomyopathy.   At the last visit his EF was lower than previous at 25% and I started [place].  He did well with this.  He has had no new lightheadedness, presyncope or syncope.  He denies any chest pressure, neck or arm discomfort.  He has his chronic lower extremity swelling.  He still works his job at [employer].  He might get short of breath climbing several flights of stairs.   Allergies  Allergen Reactions  . Prednisone Rash  . Sulfa Antibiotics     Hyper     Current Outpatient Medications  Medication Sig Dispense Refill  . ACCU-CHEK AVIVA PLUS test strip     . aspirin 81 MG tablet Take 81 mg by mouth daily.    Marland Kitchen atorvastatin (LIPITOR) 40 MG tablet Take 1 tablet by mouth Daily.    . cyanocobalamin 1000 MCG tablet Take 1,000 mcg by mouth daily.    . fexofenadine (ALLEGRA) 180 MG tablet Take 180 mg by mouth daily.      Marland Kitchen FLOVENT HFA 110 MCG/ACT inhaler Inhale 2 puffs into the lungs 2 (two) times daily.  3  . fluticasone (FLONASE) 50 MCG/ACT nasal spray 2 sprays by Nasal route daily.      . furosemide (LASIX) 40 MG tablet Take 40 mg by mouth 4 (four) times daily.     Marland Kitchen levothyroxine (SYNTHROID, LEVOTHROID) 175 MCG tablet Take 175 mcg by mouth daily before breakfast.    . metoprolol (TOPROL-XL) 200 MG 24 hr tablet Take 1 tablet by mouth Daily.    . mirtazapine (REMERON) 15 MG tablet Take 15 mg by mouth daily.  3  . montelukast (SINGULAIR) 10 MG tablet Take 10 mg by mouth daily.  12  . omeprazole (PRILOSEC) 40 MG capsule Take 40 mg by mouth daily.    Marland Kitchen QVAR 80 MCG/ACT inhaler Take 2 puffs by mouth at bedtime.    . sacubitril-valsartan (ENTRESTO) 97-103 MG Take 1 tablet by mouth 2 (two) times daily. 60 tablet 11   No current facility-administered medications for this visit.     Past Medical History:  Diagnosis Date  . Atrial flutter ([place])    (I could not find documentation of this rhythm.)    . AUTOMATIC IMPLANTABLE CARDIAC DEFIBRILLATOR SITU   . CHF CONGESTIVE HEART FAILURE    45% by echo 2015  . DM2 (diabetes mellitus, type 2) ([place])   . DYSLIPIDEMIA   . DYSPNEA   . Edema   . HYPERTENSION   . HYPOTHYROIDISM   . MYOCARDIAL INFARCTION    MI 1999, stent x 2 to RCA, 70% circ, 30 and 40 % LADs  . WEIGHT GAIN, ABNORMAL     Past Surgical History:  Procedure Laterality Date  . ANGIOPLASTY     stent  . APPENDECTOMY  1957  . CARDIAC DEFIBRILLATOR PLACEMENT    . CAROTID STENT    . EYE SURGERY  1985   ROS:   As stated in the HPI and negative for all other systems.  PHYSICAL EXAM BP 140/71   Pulse (!) 55   Ht '5\' 9"'$  (1.753 m)   Wt 178 lb 6.4 oz (80.9 kg)   BMI 26.35 kg/m   GENERAL:  Well appearing NECK:  No jugular venous distention, waveform within normal limits, carotid upstroke brisk and symmetric, no bruits, no thyromegaly LUNGS:  Clear to auscultation bilaterally CHEST:  Unremarkable HEART:  PMI not  displaced or sustained,S1 and S2 within normal limits, no S3, no S4, no clicks, no rubs, no murmurs ABD:  Flat, positive bowel sounds normal in frequency in pitch, no bruits, no rebound, no guarding, no midline pulsatile mass, no hepatomegaly, no splenomegaly EXT:  2 plus pulses throughout, moderate leg edema, no cyanosis no clubbing   EKG:  NA   ASSESSMENT AND PLAN   CAD:  He has had no new symptoms since stress testing in 2013 (large fixed scar).  No change in therapy or further testing at this time.   CAROTID STENOSIS:   This was mild.  No further imaging at this time.   HTN:  This is being managed in the context of treating his CHF  DYSLIPIDEMIA:   LDL was 64 last month.  No change in therapy.   CARDIOMYOPATHY:   I will increase need to be met in 1 week to the 97/103.  I will check a BMET in one week.    EDEMA:  This is baseline.  No change in therapy.   ICD:  He is up to date with follow up.

## 2017-06-17 ENCOUNTER — Encounter: Payer: Self-pay | Admitting: Cardiology

## 2017-06-17 ENCOUNTER — Ambulatory Visit: Payer: Medicare Other | Admitting: Cardiology

## 2017-06-17 VITALS — BP 140/71 | HR 55 | Ht 69.0 in | Wt 178.4 lb

## 2017-06-17 DIAGNOSIS — I10 Essential (primary) hypertension: Secondary | ICD-10-CM | POA: Diagnosis not present

## 2017-06-17 DIAGNOSIS — I5022 Chronic systolic (congestive) heart failure: Secondary | ICD-10-CM | POA: Diagnosis not present

## 2017-06-17 DIAGNOSIS — M7989 Other specified soft tissue disorders: Secondary | ICD-10-CM | POA: Diagnosis not present

## 2017-06-17 DIAGNOSIS — I255 Ischemic cardiomyopathy: Secondary | ICD-10-CM | POA: Diagnosis not present

## 2017-06-17 MED ORDER — SACUBITRIL-VALSARTAN 97-103 MG PO TABS: 1.0000 | ORAL_TABLET | Freq: Two times a day (BID) | ORAL | 11 refills | Status: DC

## 2017-06-17 NOTE — Patient Instructions (Signed)
Medication Instructions:  INCREASE- Entresto 97/103 mg daily  If you need a refill on your cardiac medications before your next appointment, please call your pharmacy.  Labwork: BMP in 1 Week  Testing/Procedures: None Ordered  Follow-Up: Your physician wants you to follow-up in: 6 Months. You should receive a reminder letter in the mail two months in advance. If you do not receive a letter, please call our office [phone].    Thank you for choosing [facility] at [facility]!!

## 2017-06-25 DIAGNOSIS — I10 Essential (primary) hypertension: Secondary | ICD-10-CM | POA: Diagnosis not present

## 2017-06-25 DIAGNOSIS — I5022 Chronic systolic (congestive) heart failure: Secondary | ICD-10-CM | POA: Diagnosis not present

## 2017-06-25 DIAGNOSIS — I255 Ischemic cardiomyopathy: Secondary | ICD-10-CM | POA: Diagnosis not present

## 2017-06-26 LAB — BASIC METABOLIC PANEL
BUN/Creatinine Ratio: 15 (ref 10–24)
BUN: 22 mg/dL (ref 8–27)
CALCIUM: 9 mg/dL (ref 8.6–10.2)
CO2: 25 mmol/L (ref 20–29)
Chloride: 104 mmol/L (ref 96–106)
Creatinine, Ser: 1.45 mg/dL — ABNORMAL HIGH (ref 0.76–1.27)
GFR, EST AFRICAN AMERICAN: 52 mL/min/{1.73_m2} — AB (ref >59)
GFR, EST NON AFRICAN AMERICAN: 45 mL/min/{1.73_m2} — AB (ref >59)
Glucose: 118 mg/dL — ABNORMAL HIGH (ref 65–99)
POTASSIUM: 5.4 mmol/L — AB (ref 3.5–5.2)
SODIUM: 140 mmol/L (ref 134–144)

## 2017-06-28 ENCOUNTER — Telehealth: Payer: Self-pay | Admitting: *Deleted

## 2017-06-28 DIAGNOSIS — Z79899 Other long term (current) drug therapy: Secondary | ICD-10-CM

## 2017-06-28 NOTE — Telephone Encounter (Signed)
Spoke to pt about his lab work, BMP ordered and pt stated he will come into office on Friday to get blood work done.Marland Kitchen

## 2017-06-28 NOTE — Telephone Encounter (Signed)
-----   Message from [name], MD sent at 06/26/2017 12:24 PM EST ----- Potassium is elevated.  Please repeat a BMET next week.  Call Mr. Asfaw with the results and send results to [name]., MD

## 2017-07-02 DIAGNOSIS — Z79899 Other long term (current) drug therapy: Secondary | ICD-10-CM | POA: Diagnosis not present

## 2017-07-03 LAB — BASIC METABOLIC PANEL
BUN / CREAT RATIO: 15 (ref 10–24)
BUN: 21 mg/dL (ref 8–27)
CALCIUM: 8.9 mg/dL (ref 8.6–10.2)
CO2: 23 mmol/L (ref 20–29)
Chloride: 100 mmol/L (ref 96–106)
Creatinine, Ser: 1.36 mg/dL — ABNORMAL HIGH (ref 0.76–1.27)
GFR calc non Af Amer: 48 mL/min/{1.73_m2} — ABNORMAL LOW (ref >59)
GFR, EST AFRICAN AMERICAN: 56 mL/min/{1.73_m2} — AB (ref >59)
GLUCOSE: 129 mg/dL — AB (ref 65–99)
POTASSIUM: 5.2 mmol/L (ref 3.5–5.2)
Sodium: 138 mmol/L (ref 134–144)

## 2017-07-22 DIAGNOSIS — E063 Autoimmune thyroiditis: Secondary | ICD-10-CM | POA: Diagnosis not present

## 2017-07-22 DIAGNOSIS — I10 Essential (primary) hypertension: Secondary | ICD-10-CM | POA: Diagnosis not present

## 2017-07-22 DIAGNOSIS — N183 Chronic kidney disease, stage 3 (moderate): Secondary | ICD-10-CM | POA: Diagnosis not present

## 2017-07-22 DIAGNOSIS — E785 Hyperlipidemia, unspecified: Secondary | ICD-10-CM | POA: Diagnosis not present

## 2017-07-22 DIAGNOSIS — E119 Type 2 diabetes mellitus without complications: Secondary | ICD-10-CM | POA: Diagnosis not present

## 2017-07-22 DIAGNOSIS — J449 Chronic obstructive pulmonary disease, unspecified: Secondary | ICD-10-CM | POA: Diagnosis not present

## 2017-07-22 DIAGNOSIS — I420 Dilated cardiomyopathy: Secondary | ICD-10-CM | POA: Diagnosis not present

## 2017-08-17 ENCOUNTER — Ambulatory Visit (INDEPENDENT_AMBULATORY_CARE_PROVIDER_SITE_OTHER): Payer: Medicare Other | Admitting: *Deleted

## 2017-08-17 DIAGNOSIS — I255 Ischemic cardiomyopathy: Secondary | ICD-10-CM

## 2017-08-17 NOTE — Progress Notes (Signed)
Remote ICD transmission.   

## 2017-08-18 ENCOUNTER — Encounter: Payer: Self-pay | Admitting: Cardiology

## 2017-08-18 LAB — CUP PACEART REMOTE DEVICE CHECK
Battery Remaining Percentage: 96 %
Date Time Interrogation Session: 20190319084600
HIGH POWER IMPEDANCE MEASURED VALUE: 66 Ohm
Implantable Lead Implant Date: 20020206
Implantable Lead Implant Date: 20071005
Implantable Lead Location: 753860
Implantable Lead Model: 4088
Implantable Lead Serial Number: 109512
Implantable Lead Serial Number: 224392
Implantable Pulse Generator Implant Date: 20120330
Lead Channel Pacing Threshold Amplitude: 2.2 V
Lead Channel Pacing Threshold Pulse Width: 1 ms
MDC IDC LEAD LOCATION: 753860
MDC IDC MSMT BATTERY REMAINING LONGEVITY: 90 mo
MDC IDC MSMT LEADCHNL RV IMPEDANCE VALUE: 547 Ohm
MDC IDC PG SERIAL: 114258
MDC IDC SET LEADCHNL RV PACING AMPLITUDE: 3 V
MDC IDC SET LEADCHNL RV PACING PULSEWIDTH: 1 ms
MDC IDC SET LEADCHNL RV SENSING SENSITIVITY: 0.5 mV
MDC IDC STAT BRADY RV PERCENT PACED: 0 %

## 2017-10-15 DIAGNOSIS — E119 Type 2 diabetes mellitus without complications: Secondary | ICD-10-CM | POA: Diagnosis not present

## 2017-10-15 DIAGNOSIS — H527 Unspecified disorder of refraction: Secondary | ICD-10-CM | POA: Diagnosis not present

## 2017-10-26 DIAGNOSIS — J189 Pneumonia, unspecified organism: Secondary | ICD-10-CM | POA: Diagnosis not present

## 2017-10-26 DIAGNOSIS — E118 Type 2 diabetes mellitus with unspecified complications: Secondary | ICD-10-CM | POA: Diagnosis not present

## 2017-10-26 DIAGNOSIS — Z79899 Other long term (current) drug therapy: Secondary | ICD-10-CM | POA: Diagnosis not present

## 2017-10-26 DIAGNOSIS — E063 Autoimmune thyroiditis: Secondary | ICD-10-CM | POA: Diagnosis not present

## 2017-10-26 DIAGNOSIS — J449 Chronic obstructive pulmonary disease, unspecified: Secondary | ICD-10-CM | POA: Diagnosis not present

## 2017-10-26 DIAGNOSIS — J029 Acute pharyngitis, unspecified: Secondary | ICD-10-CM | POA: Diagnosis not present

## 2017-10-26 DIAGNOSIS — E785 Hyperlipidemia, unspecified: Secondary | ICD-10-CM | POA: Diagnosis not present

## 2017-11-16 ENCOUNTER — Ambulatory Visit (INDEPENDENT_AMBULATORY_CARE_PROVIDER_SITE_OTHER): Payer: Medicare Other | Admitting: *Deleted

## 2017-11-16 DIAGNOSIS — I472 Ventricular tachycardia, unspecified: Secondary | ICD-10-CM

## 2017-11-16 DIAGNOSIS — I255 Ischemic cardiomyopathy: Secondary | ICD-10-CM

## 2017-11-16 NOTE — Progress Notes (Signed)
Remote ICD transmission.   

## 2017-12-01 LAB — CUP PACEART REMOTE DEVICE CHECK
Battery Remaining Longevity: 90 mo
Battery Remaining Percentage: 93 %
Date Time Interrogation Session: 20190618100600
HighPow Impedance: 66 Ohm
Implantable Lead Implant Date: 20020206
Implantable Lead Implant Date: 20071005
Implantable Lead Location: 753860
Implantable Lead Location: 753860
Implantable Lead Model: 4088
Implantable Lead Serial Number: 109512
Implantable Lead Serial Number: 224392
Implantable Pulse Generator Implant Date: 20120330
Lead Channel Pacing Threshold Pulse Width: 1 ms
Lead Channel Setting Pacing Amplitude: 3 V
Lead Channel Setting Sensing Sensitivity: 0.5 mV
MDC IDC MSMT LEADCHNL RV IMPEDANCE VALUE: 548 Ohm
MDC IDC MSMT LEADCHNL RV PACING THRESHOLD AMPLITUDE: 2.2 V
MDC IDC PG SERIAL: 114258
MDC IDC SET LEADCHNL RV PACING PULSEWIDTH: 1 ms
MDC IDC STAT BRADY RV PERCENT PACED: 0 %

## 2017-12-28 DIAGNOSIS — R0602 Shortness of breath: Secondary | ICD-10-CM | POA: Diagnosis not present

## 2017-12-28 DIAGNOSIS — I509 Heart failure, unspecified: Secondary | ICD-10-CM | POA: Diagnosis not present

## 2017-12-28 DIAGNOSIS — M7989 Other specified soft tissue disorders: Secondary | ICD-10-CM | POA: Diagnosis not present

## 2017-12-28 DIAGNOSIS — I420 Dilated cardiomyopathy: Secondary | ICD-10-CM | POA: Diagnosis not present

## 2017-12-29 DIAGNOSIS — R7989 Other specified abnormal findings of blood chemistry: Secondary | ICD-10-CM | POA: Diagnosis not present

## 2017-12-29 DIAGNOSIS — M7989 Other specified soft tissue disorders: Secondary | ICD-10-CM | POA: Diagnosis not present

## 2017-12-30 DIAGNOSIS — R0602 Shortness of breath: Secondary | ICD-10-CM | POA: Diagnosis not present

## 2017-12-30 DIAGNOSIS — I420 Dilated cardiomyopathy: Secondary | ICD-10-CM | POA: Diagnosis not present

## 2017-12-30 DIAGNOSIS — N183 Chronic kidney disease, stage 3 (moderate): Secondary | ICD-10-CM | POA: Diagnosis not present

## 2018-01-05 NOTE — Progress Notes (Signed)
HPI The patient presents for followup of his coronary disease and cardiomyopathy.  Recently his EF was lower than previous at 25% and I increased Entresto.   He felt very well with this.  The patient denies any new symptoms such as chest discomfort, neck or arm discomfort. There has been no new shortness of breath, PND or orthopnea. There have been no reported palpitations, presyncope or syncope.   His leg swelling is improved and he is still working at the school.   Allergies  Allergen Reactions  . Prednisone Rash  . Sulfa Antibiotics     Hyper     Current Outpatient Medications  Medication Sig Dispense Refill  . ACCU-CHEK AVIVA PLUS test strip     . aspirin 81 MG tablet Take 81 mg by mouth daily.    Marland Kitchen atorvastatin (LIPITOR) 40 MG tablet Take 1 tablet by mouth Daily.    . cyanocobalamin 1000 MCG tablet Take 1,000 mcg by mouth daily.    Marland Kitchen FLOVENT HFA 110 MCG/ACT inhaler Inhale 2 puffs into the lungs 2 (two) times daily.  3  . fluticasone (FLONASE) 50 MCG/ACT nasal spray 2 sprays by Nasal route daily.      . furosemide (LASIX) 40 MG tablet Take 40 mg by mouth 4 (four) times daily.     Marland Kitchen levothyroxine (SYNTHROID, LEVOTHROID) 175 MCG tablet Take 175 mcg by mouth daily before breakfast.    . metoprolol (TOPROL-XL) 200 MG 24 hr tablet Take 1 tablet by mouth Daily.    . mirtazapine (REMERON) 15 MG tablet Take 15 mg by mouth daily.  3  . montelukast (SINGULAIR) 10 MG tablet Take 10 mg by mouth daily.  12  . omeprazole (PRILOSEC) 40 MG capsule Take 40 mg by mouth daily.    . sacubitril-valsartan (ENTRESTO) 97-103 MG Take 1 tablet by mouth 2 (two) times daily. 60 tablet 11  . fexofenadine (ALLEGRA) 180 MG tablet Take 180 mg by mouth daily.      Marland Kitchen QVAR 80 MCG/ACT inhaler Take 2 puffs by mouth at bedtime.     No current facility-administered medications for this visit.     Past Medical History:  Diagnosis Date  . Atrial flutter ([place])    (I could not find documentation of this rhythm.)    . AUTOMATIC IMPLANTABLE CARDIAC DEFIBRILLATOR SITU   . CHF CONGESTIVE HEART FAILURE    45% by echo 2015  . DM2 (diabetes mellitus, type 2) ([place])   . DYSLIPIDEMIA   . DYSPNEA   . Edema   . HYPERTENSION   . HYPOTHYROIDISM   . MYOCARDIAL INFARCTION    MI 1999, stent x 2 to RCA, 70% circ, 30 and 40 % LADs  . WEIGHT GAIN, ABNORMAL     Past Surgical History:  Procedure Laterality Date  . ANGIOPLASTY     stent  . APPENDECTOMY  1957  . CARDIAC DEFIBRILLATOR PLACEMENT    . CAROTID STENT    . EYE SURGERY  1985   ROS:   As stated in the HPI and negative for all other systems.  PHYSICAL EXAM BP (!) 98/58   Pulse (!) 59   Ht 5\' 7"  (1.702 m)   Wt 171 lb 3.2 oz (77.7 kg)   BMI 26.81 kg/m   GENERAL:  Well appearing NECK:  No jugular venous distention, waveform within normal limits, carotid upstroke brisk and symmetric, no bruits, no thyromegaly LUNGS:  Clear to auscultation bilaterally CHEST:  Unremarkable HEART:  PMI not displaced or  sustained,S1 and S2 within normal limits, no S3, no S4, no clicks, no rubs, no murmurs ABD:  Flat, positive bowel sounds normal in frequency in pitch, no bruits, no rebound, no guarding, no midline pulsatile mass, no hepatomegaly, no splenomegaly EXT:  2 plus pulses throughout, mild ankle edema, no cyanosis no clubbing   EKG: Sinus rhythm, rate 59, left axis deviation, premature ectopic complexes.,  Left bundle branch block   ASSESSMENT AND PLAN   CAD:  He has had no new symptoms since stress testing in 2013 (large fixed scar).  No change in therapy.   CAROTID STENOSIS:   This was mild in 2016.  I will follow this up next year.   HTN:   This is being managed in the context of treating his CHF.  He is on excellent meds.  No further titration.  I will consider another echo when I see him in six months.   DYSLIPIDEMIA:   LDL was 64 recently.  No change in therapy.   CARDIOMYOPATHY:   This will be assessed as above.   EDEMA:  Improved.  No change  in therapy.    ICD:  He is up to date with follow up.

## 2018-01-06 ENCOUNTER — Ambulatory Visit: Payer: Medicare Other | Admitting: Cardiology

## 2018-01-06 ENCOUNTER — Encounter: Payer: Self-pay | Admitting: Cardiology

## 2018-01-06 VITALS — BP 98/58 | HR 59 | Ht 67.0 in | Wt 171.2 lb

## 2018-01-06 DIAGNOSIS — I2510 Atherosclerotic heart disease of native coronary artery without angina pectoris: Secondary | ICD-10-CM

## 2018-01-06 DIAGNOSIS — I255 Ischemic cardiomyopathy: Secondary | ICD-10-CM | POA: Diagnosis not present

## 2018-01-06 NOTE — Patient Instructions (Signed)
NO CHANGE WITH CURRENT MEDICATIONS     Your physician wants you to follow-up in [place]. You will receive a reminder letter in the mail two months in advance. If you don't receive a letter, please call our office to schedule the follow-up appointment.    If you need a refill on your cardiac medications before your next appointment, please call your pharmacy.

## 2018-01-18 DIAGNOSIS — N183 Chronic kidney disease, stage 3 (moderate): Secondary | ICD-10-CM | POA: Diagnosis not present

## 2018-01-18 DIAGNOSIS — I420 Dilated cardiomyopathy: Secondary | ICD-10-CM | POA: Diagnosis not present

## 2018-01-26 DIAGNOSIS — E063 Autoimmune thyroiditis: Secondary | ICD-10-CM | POA: Diagnosis not present

## 2018-01-26 DIAGNOSIS — E785 Hyperlipidemia, unspecified: Secondary | ICD-10-CM | POA: Diagnosis not present

## 2018-01-26 DIAGNOSIS — M79672 Pain in left foot: Secondary | ICD-10-CM | POA: Diagnosis not present

## 2018-01-26 DIAGNOSIS — Z9181 History of falling: Secondary | ICD-10-CM | POA: Diagnosis not present

## 2018-01-26 DIAGNOSIS — E118 Type 2 diabetes mellitus with unspecified complications: Secondary | ICD-10-CM | POA: Diagnosis not present

## 2018-01-26 DIAGNOSIS — Z79899 Other long term (current) drug therapy: Secondary | ICD-10-CM | POA: Diagnosis not present

## 2018-01-26 DIAGNOSIS — I420 Dilated cardiomyopathy: Secondary | ICD-10-CM | POA: Diagnosis not present

## 2018-01-28 DIAGNOSIS — S99922A Unspecified injury of left foot, initial encounter: Secondary | ICD-10-CM | POA: Diagnosis not present

## 2018-01-28 DIAGNOSIS — M7989 Other specified soft tissue disorders: Secondary | ICD-10-CM | POA: Diagnosis not present

## 2018-01-28 DIAGNOSIS — M79672 Pain in left foot: Secondary | ICD-10-CM | POA: Diagnosis not present

## 2018-01-28 DIAGNOSIS — S99912A Unspecified injury of left ankle, initial encounter: Secondary | ICD-10-CM | POA: Diagnosis not present

## 2018-02-01 DIAGNOSIS — M216X1 Other acquired deformities of right foot: Secondary | ICD-10-CM | POA: Diagnosis not present

## 2018-02-01 DIAGNOSIS — R0989 Other specified symptoms and signs involving the circulatory and respiratory systems: Secondary | ICD-10-CM | POA: Diagnosis not present

## 2018-02-01 DIAGNOSIS — E119 Type 2 diabetes mellitus without complications: Secondary | ICD-10-CM | POA: Diagnosis not present

## 2018-02-01 DIAGNOSIS — M216X2 Other acquired deformities of left foot: Secondary | ICD-10-CM | POA: Diagnosis not present

## 2018-02-01 DIAGNOSIS — L919 Hypertrophic disorder of the skin, unspecified: Secondary | ICD-10-CM

## 2018-02-01 DIAGNOSIS — L909 Atrophic disorder of skin, unspecified: Secondary | ICD-10-CM | POA: Insufficient documentation

## 2018-02-08 DIAGNOSIS — E785 Hyperlipidemia, unspecified: Secondary | ICD-10-CM | POA: Diagnosis not present

## 2018-02-08 DIAGNOSIS — I509 Heart failure, unspecified: Secondary | ICD-10-CM | POA: Diagnosis not present

## 2018-02-08 DIAGNOSIS — R29898 Other symptoms and signs involving the musculoskeletal system: Secondary | ICD-10-CM | POA: Diagnosis not present

## 2018-02-08 DIAGNOSIS — N183 Chronic kidney disease, stage 3 (moderate): Secondary | ICD-10-CM | POA: Diagnosis not present

## 2018-02-15 ENCOUNTER — Ambulatory Visit (INDEPENDENT_AMBULATORY_CARE_PROVIDER_SITE_OTHER): Payer: Medicare Other | Admitting: *Deleted

## 2018-02-15 DIAGNOSIS — I255 Ischemic cardiomyopathy: Secondary | ICD-10-CM | POA: Diagnosis not present

## 2018-02-15 DIAGNOSIS — I472 Ventricular tachycardia, unspecified: Secondary | ICD-10-CM

## 2018-02-15 NOTE — Progress Notes (Signed)
Remote ICD transmission.   

## 2018-02-16 DIAGNOSIS — I70213 Atherosclerosis of native arteries of extremities with intermittent claudication, bilateral legs: Secondary | ICD-10-CM | POA: Diagnosis not present

## 2018-02-16 DIAGNOSIS — R0989 Other specified symptoms and signs involving the circulatory and respiratory systems: Secondary | ICD-10-CM | POA: Diagnosis not present

## 2018-02-16 DIAGNOSIS — I70203 Unspecified atherosclerosis of native arteries of extremities, bilateral legs: Secondary | ICD-10-CM | POA: Diagnosis not present

## 2018-02-17 DIAGNOSIS — R2689 Other abnormalities of gait and mobility: Secondary | ICD-10-CM | POA: Diagnosis not present

## 2018-02-17 DIAGNOSIS — M6281 Muscle weakness (generalized): Secondary | ICD-10-CM | POA: Diagnosis not present

## 2018-02-17 DIAGNOSIS — R201 Hypoesthesia of skin: Secondary | ICD-10-CM | POA: Diagnosis not present

## 2018-02-17 DIAGNOSIS — R5383 Other fatigue: Secondary | ICD-10-CM | POA: Diagnosis not present

## 2018-02-17 DIAGNOSIS — R29898 Other symptoms and signs involving the musculoskeletal system: Secondary | ICD-10-CM | POA: Diagnosis not present

## 2018-02-17 DIAGNOSIS — R2681 Unsteadiness on feet: Secondary | ICD-10-CM | POA: Diagnosis not present

## 2018-02-22 DIAGNOSIS — N183 Chronic kidney disease, stage 3 (moderate): Secondary | ICD-10-CM | POA: Diagnosis not present

## 2018-02-22 DIAGNOSIS — E118 Type 2 diabetes mellitus with unspecified complications: Secondary | ICD-10-CM | POA: Diagnosis not present

## 2018-02-22 DIAGNOSIS — I509 Heart failure, unspecified: Secondary | ICD-10-CM | POA: Diagnosis not present

## 2018-02-22 DIAGNOSIS — E063 Autoimmune thyroiditis: Secondary | ICD-10-CM | POA: Diagnosis not present

## 2018-02-22 NOTE — Progress Notes (Deleted)
Cardiology Office Note:    Date:  02/22/2018   ID:  [name], DOB May 19, 1934, MRN [id number]  PCP:  [name]., MD  Cardiologist:  [name], MD   Referring MD: [name]., MD   No chief complaint on file. ***  History of Present Illness:    [name] is a 84 y.o. male with a hx of coronary artery disease and ischemic cardiomyopathy.  Recent EF 2018 was lower than previous at 25% and Entresto was titrated.  He recently saw Dr. Percival Spanish in clinic on 01/06/2018.  At that time he was doing well.  Last stress test in 2013 with large fixed scar.  He has an ICD in place. He is on a good medical regimen.   He returns today for        Past Medical History:  Diagnosis Date  . Atrial flutter ([place])    (I could not find documentation of this rhythm.)  . AUTOMATIC IMPLANTABLE CARDIAC DEFIBRILLATOR SITU   . CHF CONGESTIVE HEART FAILURE    45% by echo 2015  . DM2 (diabetes mellitus, type 2) ([place])   . DYSLIPIDEMIA   . DYSPNEA   . Edema   . HYPERTENSION   . HYPOTHYROIDISM   . MYOCARDIAL INFARCTION    MI 1999, stent x 2 to RCA, 70% circ, 30 and 40 % LADs  . WEIGHT GAIN, ABNORMAL     Past Surgical History:  Procedure Laterality Date  . ANGIOPLASTY     stent  . APPENDECTOMY  1957  . CARDIAC DEFIBRILLATOR PLACEMENT    . CAROTID STENT    . EYE SURGERY  1985    Current Medications: No outpatient medications have been marked as taking for the 02/23/18 encounter (Appointment) with Ledora Bottcher, [place].     Allergies:   Prednisone and Sulfa antibiotics   Social History   Socioeconomic History  . Marital status: Married    Spouse name: Not on file  . Number of children: Not on file  . Years of education: Not on file  . Highest education level: Not on file  Occupational History  . Not on file  Social Needs  . Financial resource strain: Not on file  . Food insecurity:    Worry: Not on file    Inability: Not on file  . Transportation needs:   Medical: Not on file    Non-medical: Not on file  Tobacco Use  . Smoking status: Former Smoker    Last attempt to quit: 12/10/1983    Years since quitting: 34.2  . Smokeless tobacco: Never Used  . Tobacco comment: quit 1985  Substance and Sexual Activity  . Alcohol use: No  . Drug use: No  . Sexual activity: Not on file  Lifestyle  . Physical activity:    Days per week: Not on file    Minutes per session: Not on file  . Stress: Not on file  Relationships  . Social connections:    Talks on phone: Not on file    Gets together: Not on file    Attends religious service: Not on file    Active member of club or organization: Not on file    Attends meetings of clubs or organizations: Not on file    Relationship status: Not on file  Other Topics Concern  . Not on file  Social History Narrative  . Not on file     Family History: The patient's ***family history includes Cancer  in his brother; Kidney disease in his father; Other in his mother.  ROS:   Please see the history of present illness.    *** All other systems reviewed and are negative.  EKGs/Labs/Other Studies Reviewed:    The following studies were reviewed today:  Echo 05/06/17: Study Conclusions - Left ventricle: Diffuse hypokinesis with inferior wall akinesis.   The cavity size was moderately dilated. Wall thickness was   increased in a pattern of mild LVH. The estimated ejection   fraction was 25%. Doppler parameters are consistent with abnormal   left ventricular relaxation (grade 1 diastolic dysfunction). - Left atrium: The atrium was mildly dilated. - Atrial septum: No defect or patent foramen ovale was identified. - Pulmonary arteries: PA peak pressure: 46 mm Hg (S).   EKG:  EKG is *** ordered today.  The ekg ordered today demonstrates ***  Recent Labs: 07/02/2017: BUN 21; Creatinine, Ser 1.36; Potassium 5.2; Sodium 138  Recent Lipid Panel    Component Value Date/Time   CHOL 143 06/22/2011 0925   TRIG  125.0 06/22/2011 0925   HDL 36.60 (L) 06/22/2011 0925   CHOLHDL 4 06/22/2011 0925   VLDL 25.0 06/22/2011 0925   LDLCALC 81 06/22/2011 0925    Physical Exam:    VS:  There were no vitals taken for this visit.    Wt Readings from Last 3 Encounters:  01/06/18 171 lb 3.2 oz (77.7 kg)  06/17/17 178 lb 6.4 oz (80.9 kg)  05/03/17 184 lb 6.4 oz (83.6 kg)     GEN: *** Well nourished, well developed in no acute distress HEENT: Normal NECK: No JVD; No carotid bruits LYMPHATICS: No lymphadenopathy CARDIAC: ***RRR, no murmurs, rubs, gallops RESPIRATORY:  Clear to auscultation without rales, wheezing or rhonchi  ABDOMEN: Soft, non-tender, non-distended MUSCULOSKELETAL:  No edema; No deformity  SKIN: Warm and dry NEUROLOGIC:  Alert and oriented x 3 PSYCHIATRIC:  Normal affect   ASSESSMENT:    No diagnosis found. PLAN:    In order of problems listed above:  No diagnosis found.   Medication Adjustments/Labs and Tests Ordered: Current medicines are reviewed at length with the patient today.  Concerns regarding medicines are outlined above.  No orders of the defined types were placed in this encounter.  No orders of the defined types were placed in this encounter.   Signed, Ledora Bottcher, PA  02/22/2018 4:12 PM    [facility]

## 2018-02-23 ENCOUNTER — Ambulatory Visit: Payer: Medicare Other | Admitting: Physician Assistant

## 2018-02-23 ENCOUNTER — Telehealth: Payer: Self-pay | Admitting: Cardiology

## 2018-02-23 DIAGNOSIS — I509 Heart failure, unspecified: Secondary | ICD-10-CM

## 2018-02-23 NOTE — Telephone Encounter (Signed)
New message:      Pt c/o BP issue: STAT if pt c/o blurred vision, one-sided weakness or slurred speech  1. What are your last 5 BP readings? 56/38 (am)  2. Are you having any other symptoms (ex. Dizziness, headache, blurred vision, passed out)? dizziness  3. What is your BP issue? Pt has been having trouble keeping his bp up and has been going on for the last two weeks

## 2018-02-23 NOTE — Telephone Encounter (Signed)
Agree with Dr. Claiborne Billings

## 2018-02-23 NOTE — Telephone Encounter (Signed)
Spoke with pt's daughter Fraser Din. She sts that the pt BP this morning before medications was 56/38.  Had her recheck the pt BP while I held on the phone. The pt BP was 76/48 72 bpm. The pt is currently taking  Lasix 40mg  daily (reduced yesterday by pcp due to low BP) Toprol XL 100mg  daily Entresto 97/103mg  daily.  Pt is having some dizziness, denies any other symptoms, his BP has been running low for 2 weeks. He has an appt on 10/11 with Dr.Hochrein to address his hypotension. Alcide Clever that I will discuss with Dr.Kelly our office DID and call back with his recommendation.  Alcide Clever per [facility] he should HOLD his medications today. His BP should be monitored tomorrow prior to medication adm. Fraser Din should call the office to give an update.  Alcide Clever that I will fwd an update to Dr.Hochrein to see if he has any additional recommendations. Pat agreeable with plan and verbalized understanding. hochr

## 2018-02-24 DIAGNOSIS — M216X2 Other acquired deformities of left foot: Secondary | ICD-10-CM | POA: Diagnosis not present

## 2018-02-24 DIAGNOSIS — M216X1 Other acquired deformities of right foot: Secondary | ICD-10-CM | POA: Diagnosis not present

## 2018-02-24 DIAGNOSIS — R0989 Other specified symptoms and signs involving the circulatory and respiratory systems: Secondary | ICD-10-CM | POA: Diagnosis not present

## 2018-02-24 DIAGNOSIS — E119 Type 2 diabetes mellitus without complications: Secondary | ICD-10-CM | POA: Diagnosis not present

## 2018-02-24 MED ORDER — FUROSEMIDE 40 MG PO TABS: 40.0000 mg | ORAL_TABLET | Freq: Every day | ORAL | Status: DC

## 2018-02-24 MED ORDER — METOPROLOL SUCCINATE ER 100 MG PO TB24: 100.0000 mg | ORAL_TABLET | Freq: Every day | ORAL | Status: DC

## 2018-02-24 NOTE — Telephone Encounter (Signed)
Continued  [name] pt daughter aware of [name] recommendation. Pt should resume [name] 40mg  daily and [name] 100mg  daily. Pt should continue to HOLD [name] until he is seen on 9/30 for further medication instructions.  [name] to continue to monitor the pt BP and to call if his systolic drops below 90. Pat agreeable with plan and verbalized understanding.

## 2018-02-24 NOTE — Telephone Encounter (Signed)
Spoke with [name] pt daughter. The pt BP this am without medication is 98/62 114bpm. Denies palpitation, sob, CP, swelling Adv Pat that I will discuss with our office DOD Dr.Jordan to ger further instruction on meds for today.  Alcide Clever that I would recommend an earlier appt. Appt scheduled with Jory Sims, DNP. Adv her that I have not cancelled the appt with Dr.Hochrein on 10/11. Pt dry weight is 170lb, his weight this morning was 164lb.

## 2018-02-24 NOTE — Telephone Encounter (Signed)
Spoke with pt daughter Fraser Din. Pat sts that the pt systolic BP yesterday afternoon went up to 96 after holding his meds for the day.  The pt is still in bed. [name] to call us back when he gets up to report his BP and get further medication instruction. Pat verbalized understanding.

## 2018-02-24 NOTE — Telephone Encounter (Signed)
Called pt daughter

## 2018-02-25 NOTE — Telephone Encounter (Signed)
Agree. thanks

## 2018-02-26 NOTE — Progress Notes (Signed)
Cardiology Office Note   Date:  02/28/2018   ID:  RICHAR DUNKLEE, DOB 06-24-1933, MRN [id number]  PCP:  [name]., MD  Cardiologist: [facility]  Chief Complaint  Patient presents with  . Coronary Artery Disease  . Congestive Heart Failure  . Hypertension  . Edema    legs     History of Present Illness: [name] is a 84 y.o. male who presents for ongoing assessment and management of coronary artery disease, cardiomyopathy with EF of 25%.  He was last seen by cardiology on 01/06/2018 at which time Delene Loll had been increased.  Patient was without any new symptoms.  He was noted to have other history of carotid stenosis, hypertension, and dyslipidemia.  ICD was to be continued on ongoing surveillance per protocol.  The patient called our office on 02/23/2018 with complaints of hypotension with systolic blood pressure dropping below 90.  At that time Delene Loll was discontinued, he was to continue Lasix 40 mg daily and metoprolol XL, 100 mg daily as directed.  He is here in follow-up to evaluate his current status.  He continues to have positional dizziness and fatigue. He has less of this with discontinuation of Entresto.  He has also been diagnosed with PAD, abnormal ABI's are reported by the patient. Dr.Gage, his PCP is referring him to [facility]. I do not find these results in Care Everywhere. (Results will be requested).   Past Medical History:  Diagnosis Date  . Atrial flutter ([place])    (I could not find documentation of this rhythm.)  . AUTOMATIC IMPLANTABLE CARDIAC DEFIBRILLATOR SITU   . CHF CONGESTIVE HEART FAILURE    45% by echo 2015  . DM2 (diabetes mellitus, type 2) ([place])   . DYSLIPIDEMIA   . DYSPNEA   . Edema   . HYPERTENSION   . HYPOTHYROIDISM   . MYOCARDIAL INFARCTION    MI 1999, stent x 2 to RCA, 70% circ, 30 and 40 % LADs  . WEIGHT GAIN, ABNORMAL     Past Surgical History:  Procedure Laterality Date  . ANGIOPLASTY     stent  . APPENDECTOMY  1957  . CARDIAC  DEFIBRILLATOR PLACEMENT    . CAROTID STENT    . EYE SURGERY  1985     Current Outpatient Medications  Medication Sig Dispense Refill  . ACCU-CHEK AVIVA PLUS test strip     . aspirin 81 MG tablet Take 81 mg by mouth daily.    Marland Kitchen atorvastatin (LIPITOR) 40 MG tablet Take 1 tablet by mouth Daily.    . cyanocobalamin 1000 MCG tablet Take 1,000 mcg by mouth daily.    . fexofenadine (ALLEGRA) 180 MG tablet Take 180 mg by mouth daily.      Marland Kitchen FLOVENT HFA 110 MCG/ACT inhaler Inhale 2 puffs into the lungs 2 (two) times daily.  3  . fluticasone (FLONASE) 50 MCG/ACT nasal spray 2 sprays by Nasal route daily.      . furosemide (LASIX) 40 MG tablet Take 1 tablet (40 mg total) by mouth daily.    Marland Kitchen levothyroxine (SYNTHROID, LEVOTHROID) 150 MCG tablet Take 175 mcg by mouth daily before breakfast.     . metoprolol (TOPROL-XL) 100 MG 24 hr tablet Take 1 tablet (100 mg total) by mouth daily. (Patient taking differently: Take 50 mg by mouth daily. )    . mirtazapine (REMERON) 15 MG tablet Take 15 mg by mouth daily.  3  . montelukast (SINGULAIR) 10 MG tablet Take 10 mg by mouth  daily.  12  . omeprazole (PRILOSEC) 40 MG capsule Take 40 mg by mouth daily.    Marland Kitchen QVAR 80 MCG/ACT inhaler Take 2 puffs by mouth at bedtime.    . sacubitril-valsartan (ENTRESTO) 97-103 MG Take 1 tablet by mouth 2 (two) times daily. (Patient not taking: Reported on 02/28/2018) 60 tablet 11   No current facility-administered medications for this visit.     Allergies:   Prednisone and Sulfa antibiotics    Social History:  The patient  reports that he quit smoking about 34 years ago. He has never used smokeless tobacco. He reports that he does not drink alcohol or use drugs.   Family History:  The patient's family history includes Cancer in his brother; Kidney disease in his father; Other in his mother.    ROS: All other systems are reviewed and negative. Unless otherwise mentioned in H&P    PHYSICAL EXAM: VS:  BP (!) 100/50   Pulse  71   Ht 5\' 7"  (1.702 m)   Wt 165 lb 9.6 oz (75.1 kg)   SpO2 99%   BMI 25.94 kg/m  , BMI Body mass index is 25.94 kg/m. GEN: Well nourished, well developed, in no acute distress HEENT: normal Neck: no JVD, carotid bruits, or masses Cardiac: RRR-distant heart sounds; no murmurs, rubs, or gallops,no edema  Respiratory:  Clear to auscultation bilaterally, normal work of breathing GI: soft, nontender, nondistended, + BS MS: no deformity or atrophy Skin: warm and dry, no rash Neuro:  Strength and sensation are intact Psych: euthymic mood, full affect   EKG:  Not completed this office visit.  Recent Labs: 07/02/2017: BUN 21; Creatinine, Ser 1.36; Potassium 5.2; Sodium 138    Lipid Panel    Component Value Date/Time   CHOL 143 06/22/2011 0925   TRIG 125.0 06/22/2011 0925   HDL 36.60 (L) 06/22/2011 0925   CHOLHDL 4 06/22/2011 0925   VLDL 25.0 06/22/2011 0925   LDLCALC 81 06/22/2011 0925      Wt Readings from Last 3 Encounters:  02/28/18 165 lb 9.6 oz (75.1 kg)  01/06/18 171 lb 3.2 oz (77.7 kg)  06/17/17 178 lb 6.4 oz (80.9 kg)      Other studies Reviewed: Echocardiogram 07-31-16 Study Conclusions  - Left ventricle: Diffuse hypokinesis with inferior wall akinesis.   The cavity size was moderately dilated. Wall thickness was   increased in a pattern of mild LVH. The estimated ejection   fraction was 25%. Doppler parameters are consistent with abnormal   left ventricular relaxation (grade 1 diastolic dysfunction). - Left atrium: The atrium was mildly dilated. - Atrial septum: No defect or patent foramen ovale was identified. - Pulmonary arteries: PA peak pressure: 46 mm Hg (S).  ASSESSMENT AND PLAN:  1. Ischemic Cardiomyopathy: EF of 25%.He has not tolerated Entresto, and therefore this was discontinued.  He still has positional dizziness but this has improved.   I checked orthostatic BP to evaluate for significant drops in BP, He had significant drop in BP from 101/64  to 93/61 and 79/51. He was mildly dizzy but no significant symptoms.  I will reduce his lasix to 40 mg daily from 40 mg BID. He can take extra dose of lasix 20 mg in the afternoon if he gains weight or begins to have edema.   I will repeat his echo for changes in LV function for the better. He may not need so much diuretic if improved. Check BMET.    2, CAD: Stents to the  RCA, 70% Cx, and 30%-40% LAD. He denies chest pain or worsening dyspnea. Continue metoprolol, ASA and statin therapy.   3. Thyroid Disease: Followed by PCP  4. PAD: Requesting records for documentation. Not found in Care Everywhere.   5. Type II Diabetes: Having neuropathic pain in his feet.    Current medicines are reviewed at length with the patient today.    Labs/ tests ordered today include: BMET and Echocardiogram   Phill Myron. West Pugh, ANP, AACC   02/28/2018 1:43 PM    [facility] [address] Office [phone] Fax [phone]

## 2018-02-28 ENCOUNTER — Ambulatory Visit: Payer: Medicare Other | Admitting: Adult Health

## 2018-02-28 ENCOUNTER — Encounter: Payer: Self-pay | Admitting: Adult Health

## 2018-02-28 VITALS — BP 100/50 | HR 71 | Ht 67.0 in | Wt 165.6 lb

## 2018-02-28 DIAGNOSIS — I519 Heart disease, unspecified: Secondary | ICD-10-CM | POA: Diagnosis not present

## 2018-02-28 DIAGNOSIS — I2510 Atherosclerotic heart disease of native coronary artery without angina pectoris: Secondary | ICD-10-CM

## 2018-02-28 DIAGNOSIS — I5022 Chronic systolic (congestive) heart failure: Secondary | ICD-10-CM | POA: Diagnosis not present

## 2018-02-28 DIAGNOSIS — Z79899 Other long term (current) drug therapy: Secondary | ICD-10-CM | POA: Diagnosis not present

## 2018-02-28 DIAGNOSIS — R931 Abnormal findings on diagnostic imaging of heart and coronary circulation: Secondary | ICD-10-CM | POA: Diagnosis not present

## 2018-02-28 MED ORDER — METOPROLOL SUCCINATE ER 100 MG PO TB24: 100.0000 mg | ORAL_TABLET | Freq: Every day | ORAL | Status: DC

## 2018-02-28 NOTE — Patient Instructions (Signed)
Medication Instructions:  MAY TAKE EXTRA LASIX IN THE AFTERNOON FOR INCREASED SWELLING  If you need a refill on your cardiac medications before your next appointment, please call your pharmacy.  Labwork: BMET TODAY HERE IN OUR OFFICE AT [facility]  Take the provided lab slips with you to the lab for your blood draw.   Testing/Procedures: Echocardiogram(BEFORE [facility] APPT ON THE 11TH) - Your physician has requested that you have an echocardiogram. Echocardiography is a painless test that uses sound waves to create images of your heart. It provides your doctor with information about the size and shape of your heart and how well your heart's chambers and valves are working. This procedure takes approximately one hour. There are no restrictions for this procedure. This will be performed at our [facility] location - [address].   Follow-Up: Your physician wants you to follow-up in: [address] [place]   Thank you for choosing [facility]!!

## 2018-03-01 LAB — BASIC METABOLIC PANEL
BUN/Creatinine Ratio: 17 (ref 10–24)
BUN: 27 mg/dL (ref 8–27)
CO2: 26 mmol/L (ref 20–29)
CREATININE: 1.58 mg/dL — AB (ref 0.76–1.27)
Calcium: 9.2 mg/dL (ref 8.6–10.2)
Chloride: 95 mmol/L — ABNORMAL LOW (ref 96–106)
GFR, EST AFRICAN AMERICAN: 46 mL/min/{1.73_m2} — AB (ref >59)
GFR, EST NON AFRICAN AMERICAN: 40 mL/min/{1.73_m2} — AB (ref >59)
Glucose: 111 mg/dL — ABNORMAL HIGH (ref 65–99)
Potassium: 4.8 mmol/L (ref 3.5–5.2)
SODIUM: 137 mmol/L (ref 134–144)

## 2018-03-02 NOTE — Telephone Encounter (Signed)
Follow Up:    Please call Mardene Celeste with lab results.

## 2018-03-03 NOTE — Telephone Encounter (Signed)
Mardene Celeste pts daughter given pts lab results.

## 2018-03-03 NOTE — Telephone Encounter (Signed)
Pt's daughter returning this office call for pt's results please give her a call back.

## 2018-03-08 ENCOUNTER — Ambulatory Visit (HOSPITAL_COMMUNITY): Payer: Medicare Other | Attending: Cardiology

## 2018-03-08 ENCOUNTER — Other Ambulatory Visit: Payer: Self-pay

## 2018-03-08 DIAGNOSIS — E785 Hyperlipidemia, unspecified: Secondary | ICD-10-CM | POA: Diagnosis not present

## 2018-03-08 DIAGNOSIS — R931 Abnormal findings on diagnostic imaging of heart and coronary circulation: Secondary | ICD-10-CM | POA: Diagnosis not present

## 2018-03-08 DIAGNOSIS — N183 Chronic kidney disease, stage 3 (moderate): Secondary | ICD-10-CM | POA: Diagnosis not present

## 2018-03-08 DIAGNOSIS — I2510 Atherosclerotic heart disease of native coronary artery without angina pectoris: Secondary | ICD-10-CM | POA: Diagnosis not present

## 2018-03-08 DIAGNOSIS — I10 Essential (primary) hypertension: Secondary | ICD-10-CM | POA: Insufficient documentation

## 2018-03-08 DIAGNOSIS — I519 Heart disease, unspecified: Secondary | ICD-10-CM | POA: Insufficient documentation

## 2018-03-08 DIAGNOSIS — E063 Autoimmune thyroiditis: Secondary | ICD-10-CM | POA: Diagnosis not present

## 2018-03-08 DIAGNOSIS — I509 Heart failure, unspecified: Secondary | ICD-10-CM | POA: Diagnosis not present

## 2018-03-08 DIAGNOSIS — I959 Hypotension, unspecified: Secondary | ICD-10-CM | POA: Diagnosis not present

## 2018-03-10 NOTE — Progress Notes (Signed)
HPI The patient presents for followup of his coronary disease and cardiomyopathy.   At the last visit his EF was lower than previous at 25% and I started Melvin.  However, he developed significant hypotension and has not tolerated this and has been off that med.  A repeat echo was unchanged at 25% last week.    He actually feels quite well now.  He is been on short-term disability with his hypotension but he is now not having any dizziness.  He has not been particularly active.  He is not having any new shortness of breath, PND or orthopnea.  Said no palpitations, presyncope or syncope.  He is had no weight gain and only mild lower extremity edema.  He is wearing his compression stockings.  Allergies  Allergen Reactions  . Prednisone Rash  . Sulfa Antibiotics     Hyper     Current Outpatient Medications  Medication Sig Dispense Refill  . ACCU-CHEK AVIVA PLUS test strip     . aspirin 81 MG tablet Take 81 mg by mouth daily.    Marland Kitchen atorvastatin (LIPITOR) 40 MG tablet Take 1 tablet by mouth Daily.    . cyanocobalamin 1000 MCG tablet Take 1,000 mcg by mouth daily.    . fexofenadine (ALLEGRA) 180 MG tablet Take 180 mg by mouth daily.      Marland Kitchen FLOVENT HFA 110 MCG/ACT inhaler Inhale 2 puffs into the lungs 2 (two) times daily.  3  . fluticasone (FLONASE) 50 MCG/ACT nasal spray 2 sprays by Nasal route daily.      . furosemide (LASIX) 40 MG tablet Take 1 tablet (40 mg total) by mouth daily.    Marland Kitchen levothyroxine (SYNTHROID, LEVOTHROID) 150 MCG tablet Take 175 mcg by mouth daily before breakfast.     . metoprolol succinate (TOPROL-XL) 100 MG 24 hr tablet Take 1 tablet (100 mg total) by mouth daily.    . mirtazapine (REMERON) 15 MG tablet Take 15 mg by mouth daily.  3  . montelukast (SINGULAIR) 10 MG tablet Take 10 mg by mouth daily.  12  . omeprazole (PRILOSEC) 40 MG capsule Take 40 mg by mouth daily.    Marland Kitchen QVAR 80 MCG/ACT inhaler Take 2 puffs by mouth at bedtime.     No current facility-administered  medications for this visit.     Past Medical History:  Diagnosis Date  . Atrial flutter ([place])    (I could not find documentation of this rhythm.)  . AUTOMATIC IMPLANTABLE CARDIAC DEFIBRILLATOR SITU   . Automatic implantable cardioverter-defibrillator in situ 12/05/2009   Qualifier: Diagnosis of  By: [name], MD, [name]   . CAD (coronary artery disease) 05/22/2011  . Carotid stenosis 05/22/2011  . CHF CONGESTIVE HEART FAILURE    45% by echo 2015  . COPD 12/01/2007   Qualifier: Diagnosis of  By: [name] NP, [name]    . DM2 (diabetes mellitus, type 2) ([place])   . DYSLIPIDEMIA   . DYSPNEA   . Edema   . Essential hypertension 11/17/2007   Qualifier: Diagnosis of  By: [name]    . HYPERTENSION   . HYPOTHYROIDISM   . MYOCARDIAL INFARCTION    MI 1999, stent x 2 to RCA, 70% circ, 30 and 40 % LADs  . WEIGHT GAIN, ABNORMAL     Past Surgical History:  Procedure Laterality Date  . ANGIOPLASTY     stent  . APPENDECTOMY  1957  . CARDIAC DEFIBRILLATOR PLACEMENT    . CAROTID STENT    .  EYE SURGERY  1985   ROS:   As stated in the HPI and negative for all other systems.  PHYSICAL EXAM BP (!) 114/54   Pulse 61   Ht 5\' 7"  (1.702 m)   Wt 169 lb (76.7 kg)   SpO2 99%   BMI 26.47 kg/m   GENERAL:  Well appearing NECK:  No jugular venous distention, waveform within normal limits, carotid upstroke brisk and symmetric, no bruits, no thyromegaly LUNGS:  Clear to auscultation bilaterally CHEST:   Well healed ICD pocket.      HEART:  PMI not displaced or sustained,S1 and S2 within normal limits, no S3, no S4, no clicks, no rubs, no murmurs ABD:  Flat, positive bowel sounds normal in frequency in pitch, positive bruits, no rebound, no guarding, no midline pulsatile mass, no hepatomegaly, no splenomegaly EXT:  2 plus pulses throughout, no edema, no cyanosis no clubbing    EKG:  NA    ASSESSMENT AND PLAN   CAD:   The patient has no new sypmtoms.  No further cardiovascular  testing is indicated.  We will continue with aggressive risk reduction and meds as listed.   He has had no new symptoms since stress testing in 2013 (large fixed scar).    HTN: I did review a blood pressure diary today and his systolic still fall occasionally into the 80s so I cannot titrate meds at this point.   DYSLIPIDEMIA:   LDL was 58 in August.  Continue current therapy.   CARDIOMYOPATHY:    EF remains low as above.   I cannot titrate the meds.  No change in therapy.   EDEMA:   This is mild.  No change in therapy.   ICD:   He is up to date with follow up.  I reviewed this result.    ABDOMINAL BRUIT:  I will schedule an abdominal ultrasound when he returns.

## 2018-03-11 ENCOUNTER — Encounter: Payer: Self-pay | Admitting: Cardiology

## 2018-03-11 ENCOUNTER — Ambulatory Visit: Payer: Medicare Other | Admitting: Cardiology

## 2018-03-11 ENCOUNTER — Encounter

## 2018-03-11 VITALS — BP 114/54 | HR 61 | Ht 67.0 in | Wt 169.0 lb

## 2018-03-11 DIAGNOSIS — I255 Ischemic cardiomyopathy: Secondary | ICD-10-CM

## 2018-03-11 DIAGNOSIS — R0989 Other specified symptoms and signs involving the circulatory and respiratory systems: Secondary | ICD-10-CM

## 2018-03-11 DIAGNOSIS — I2510 Atherosclerotic heart disease of native coronary artery without angina pectoris: Secondary | ICD-10-CM

## 2018-03-11 DIAGNOSIS — Z87891 Personal history of nicotine dependence: Secondary | ICD-10-CM

## 2018-03-11 LAB — CUP PACEART REMOTE DEVICE CHECK
Battery Remaining Longevity: 84 mo
Brady Statistic RV Percent Paced: 0 %
HIGH POWER IMPEDANCE MEASURED VALUE: 58 Ohm
Implantable Lead Implant Date: 20020206
Implantable Lead Location: 753860
Implantable Lead Location: 753860
Implantable Lead Serial Number: 109512
Implantable Pulse Generator Implant Date: 20120330
Lead Channel Impedance Value: 543 Ohm
Lead Channel Pacing Threshold Amplitude: 2.2 V
Lead Channel Pacing Threshold Pulse Width: 1 ms
Lead Channel Setting Pacing Amplitude: 3 V
Lead Channel Setting Pacing Pulse Width: 1 ms
MDC IDC LEAD IMPLANT DT: 20071005
MDC IDC LEAD SERIAL: 224392
MDC IDC MSMT BATTERY REMAINING PERCENTAGE: 90 %
MDC IDC SESS DTM: 20190917121600
MDC IDC SET LEADCHNL RV SENSING SENSITIVITY: 0.5 mV
Pulse Gen Serial Number: 114258

## 2018-03-11 NOTE — Patient Instructions (Signed)
Medication Instructions:  Continue current medications  If you need a refill on your cardiac medications before your next appointment, please call your pharmacy.  Labwork: None Ordered   If you have labs (blood work) drawn today and your tests are completely normal, you will receive your results only by: Marland Kitchen MyChart Message (if you have MyChart) OR . A paper copy in the mail If you have any lab test that is abnormal or we need to change your treatment, we will call you to review the results.  Testing/Procedures: Your physician has requested that you have an abdominal aorta duplex in 3 Months. During this test, an ultrasound is used to evaluate the aorta. Allow 30 minutes for this exam. Do not eat after midnight the day before and avoid carbonated beverages  Follow-Up: . You will need a follow up appointment in 3 Months.     At [facility], you and your health needs are our priority.  As part of our continuing mission to provide you with exceptional heart care, we have created designated Provider Care Teams.  These Care Teams include your primary Cardiologist (physician) and Advanced Practice Providers (APPs -  Physician Assistants and Nurse Practitioners) who all work together to provide you with the care you need, when you need it.   Thank you for choosing [facility]!!

## 2018-03-14 NOTE — Progress Notes (Signed)
Cardiology Office Note Date:  03/14/2018  Patient ID:  Leodis, Alcocer 1933/12/08, MRN [id number] PCP:  [name]., MD  Cardiologist:  Dr. Percival Spanish Electrophysiologist: Dr. [name]    Chief Complaint: annual EP visit  History of Present Illness: ARISH REDNER is a 84 y.o. male with history of CAD, ICM, chronic CHF (systolic), ICD, VT, COPD, HTN now w/relative hypotension, HLD, DM, hypothyroidism, AFlutter is listed on his problem/hx list, though unclear when/if this happened.  He comes today to be seen for Dr. [name], last seen by him Oct 2018. Noted he was doing well, mentioned his RV threshold "up a bit", I don't see any programming changes made.  More recently saw Dr. Percival Spanish only last week.  Noting no ongoing issues with hypotension, though was unable to tolerate Entresto, was wearing support stockings.  Felt to be well compensated.  Noted an abdominal bruit and ordered for an [place].  He feels very well, comes accompanied by his wife.  Says since being off the Entresto his BP has been better and he feels very well.  No CP, palpitations or rest SOB.  He will get a little winded with stairs, this is not new or unusual for him.  No symptoms of PND or orthopnea.  No dizziness, near syncope or syncope.  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  He thinks he may give consideration to retirement next year.   Device information: BSCi single chamber ICD, 2002 , 2007, 08/29/10 CXR notes 2 RV leads, one appears to be a pacing lead   Past Medical History:  Diagnosis Date  . Atrial flutter ([place])    (I could not find documentation of this rhythm.)  . AUTOMATIC IMPLANTABLE CARDIAC DEFIBRILLATOR SITU   . Automatic implantable cardioverter-defibrillator in situ 12/05/2009   Qualifier: Diagnosis of  By: [name], MD, [name]   . CAD (coronary artery disease) 05/22/2011  . Carotid stenosis 05/22/2011  . CHF CONGESTIVE  HEART FAILURE    45% by echo 2015  . COPD 12/01/2007   Qualifier: Diagnosis of  By: [name] NP, [name]    . DM2 (diabetes mellitus, type 2) ([place])   . DYSLIPIDEMIA   . DYSPNEA   . Edema   . Essential hypertension 11/17/2007   Qualifier: Diagnosis of  By: [name]    . HYPERTENSION   . HYPOTHYROIDISM   . MYOCARDIAL INFARCTION    MI 1999, stent x 2 to RCA, 70% circ, 30 and 40 % LADs  . WEIGHT GAIN, ABNORMAL     Past Surgical History:  Procedure Laterality Date  . ANGIOPLASTY     stent  . APPENDECTOMY  1957  . CARDIAC DEFIBRILLATOR PLACEMENT    . CAROTID STENT    . EYE SURGERY  1985    Current Outpatient Medications  Medication Sig Dispense Refill  . ACCU-CHEK AVIVA PLUS test strip     . aspirin 81 MG tablet Take 81 mg by mouth daily.    Marland Kitchen atorvastatin (LIPITOR) 40 MG tablet Take 1 tablet by mouth Daily.    . cyanocobalamin 1000 MCG tablet Take 1,000 mcg by mouth daily.    . fexofenadine (ALLEGRA) 180 MG tablet Take 180 mg by mouth daily.      Marland Kitchen FLOVENT HFA 110 MCG/ACT inhaler Inhale 2 puffs into the lungs 2 (two) times daily.  3  . fluticasone (FLONASE) 50 MCG/ACT nasal spray 2 sprays by Nasal route daily.      Marland Kitchen  furosemide (LASIX) 40 MG tablet Take 1 tablet (40 mg total) by mouth daily.    Marland Kitchen levothyroxine (SYNTHROID, LEVOTHROID) 150 MCG tablet Take 175 mcg by mouth daily before breakfast.     . metoprolol succinate (TOPROL-XL) 100 MG 24 hr tablet Take 1 tablet (100 mg total) by mouth daily.    . mirtazapine (REMERON) 15 MG tablet Take 15 mg by mouth daily.  3  . montelukast (SINGULAIR) 10 MG tablet Take 10 mg by mouth daily.  12  . omeprazole (PRILOSEC) 40 MG capsule Take 40 mg by mouth daily.    Marland Kitchen QVAR 80 MCG/ACT inhaler Take 2 puffs by mouth at bedtime.     No current facility-administered medications for this visit.     Allergies:   Prednisone and Sulfa antibiotics   Social History:  The patient  reports that he quit smoking about 34 years ago. He has never used  smokeless tobacco. He reports that he does not drink alcohol or use drugs.   Family History:  The patient's family history includes Cancer in his brother; Kidney disease in his father; Other in his mother.  ROS:  Please see the history of present illness. All other systems are reviewed and otherwise negative.   PHYSICAL EXAM:  VS:  There were no vitals taken for this visit. BMI: There is no height or weight on file to calculate BMI. Well nourished, well developed, in no acute distress  HEENT: normocephalic, atraumatic  Neck: no JVD, carotid bruits or masses Cardiac:  RRR; no significant murmurs, no rubs, or gallops Lungs:  CTA b/l, no wheezing, rhonchi or rales  Abd: soft, nontender MS: no deformity, age appropriate atrophy Ext:  trace edema  Skin: warm and dry, no rash Neuro:  No gross deficits appreciated Psych: euthymic mood, full affect  ICD site is stable, no tethering or discomfort   EKG:  Not done today ICD interrogation done today and reviewed by myself: battery is stable, RV threshold up just slightly from last year, outputs adjusted, he VP 0%, sensing and impedance are good, NSVT only noted, 2 EGMs for review, one looks ST.  03/08/18: TTE Study Conclusions - Left ventricle: The cavity size was mildly dilated. Systolic   function was severely reduced. The estimated ejection fraction   was in the range of 25% to 30%. Severe diffuse hypokinesis with   distinct regional wall motion abnormalities. There is akinesis of   the basalinferoseptal myocardium. There is akinesis of the   inferior myocardium. There was an increased relative contribution   of atrial contraction to ventricular filling. Doppler parameters   are consistent with abnormal left ventricular relaxation (grade 1   diastolic dysfunction). - Ventricular septum: Septal motion showed moderate paradox. These   changes are consistent with intraventricular conduction delay. - Aortic valve: Trileaflet; mildly  thickened, moderately calcified   leaflets. - Mitral valve: There was trivial regurgitation. - Left atrium: The atrium was mildly dilated. - Right ventricle: Pacer wire or catheter noted in right ventricle. - Tricuspid valve: There was trivial regurgitation.   Recent Labs: 02/28/2018: BUN 27; Creatinine, Ser 1.58; Potassium 4.8; Sodium 137  No results found for requested labs within last 8760 hours.   Estimated Creatinine Clearance: 33.7 mL/min (A) (by C-G formula based on SCr of 1.58 mg/dL (H)).   Wt Readings from Last 3 Encounters:  03/11/18 169 lb (76.7 kg)  02/28/18 165 lb 9.6 oz (75.1 kg)  01/06/18 171 lb 3.2 oz (77.7 kg)  Other studies reviewed: Additional studies/records reviewed today include: summarized above  ASSESSMENT AND PLAN:  1. ICD    Intact function  2. ICM, chronic CHF     No symptoms or exam findings to suggest fluid OL      BP unable to tolerate Entresto, Dr. Percival Spanish felt not likely able to titrate meds, will defer to him, he sees him again in a couple months  3. CAD     No anginal symptoms     On ASA, BB, statin     Just saw Dr Percival Spanish, no changes     4. HTN     Relative hypotension, unable to tolerate [employer] [place] today     No ongoing symptoms   Disposition: F/u with q 3 mo remotes, in-clinic w/EP, 1 year, sooner if needed   Current medicines are reviewed at length with the patient today.  The patient did not have any concerns regarding medicines.  Venetia Night, PA-C 03/14/2018 1:00 PM     [place] [address] [phone] (office)  [phone] (fax)

## 2018-03-15 ENCOUNTER — Ambulatory Visit: Payer: Medicare Other | Admitting: Physician Assistant

## 2018-03-15 VITALS — BP 122/66 | HR 78 | Ht 67.0 in | Wt 155.0 lb

## 2018-03-15 DIAGNOSIS — I5022 Chronic systolic (congestive) heart failure: Secondary | ICD-10-CM | POA: Diagnosis not present

## 2018-03-15 DIAGNOSIS — I2510 Atherosclerotic heart disease of native coronary artery without angina pectoris: Secondary | ICD-10-CM

## 2018-03-15 DIAGNOSIS — I255 Ischemic cardiomyopathy: Secondary | ICD-10-CM

## 2018-03-15 DIAGNOSIS — Z95810 Presence of automatic (implantable) cardiac defibrillator: Secondary | ICD-10-CM

## 2018-03-15 DIAGNOSIS — I10 Essential (primary) hypertension: Secondary | ICD-10-CM | POA: Diagnosis not present

## 2018-03-15 NOTE — Patient Instructions (Addendum)
Medication Instructions:   Your physician recommends that you continue on your current medications as directed. Please refer to the Current Medication list given to you today.   If you need a refill on your cardiac medications before your next appointment, please call your pharmacy.  Labwork: NONE ORDERED  TODAY    Testing/Procedures: NONE ORDERED  TODAY    Follow-Up: Your physician wants you to follow-up in: [place] will receive a reminder letter in the mail two months in advance. If you don't receive a letter, please call our office to schedule the follow-up appointment.  Remote monitoring is used to monitor your Pacemaker of ICD from home. This monitoring reduces the number of office visits required to check your device to one time per year. It allows [place] to keep an eye on the functioning of your device to ensure it is working properly. You are scheduled for a device check from home on .  05-17-18 You may send your transmission at any time that day. If you have a wireless device, the transmission will be sent automatically. After your physician reviews your transmission, you will receive a postcard with your next transmission date.     Any Other Special Instructions Will Be Listed Below (If Applicable).

## 2018-03-22 DIAGNOSIS — N183 Chronic kidney disease, stage 3 (moderate): Secondary | ICD-10-CM | POA: Diagnosis not present

## 2018-03-22 DIAGNOSIS — E063 Autoimmune thyroiditis: Secondary | ICD-10-CM | POA: Diagnosis not present

## 2018-03-22 DIAGNOSIS — J301 Allergic rhinitis due to pollen: Secondary | ICD-10-CM | POA: Diagnosis not present

## 2018-03-22 DIAGNOSIS — G4700 Insomnia, unspecified: Secondary | ICD-10-CM | POA: Diagnosis not present

## 2018-03-22 DIAGNOSIS — Z23 Encounter for immunization: Secondary | ICD-10-CM | POA: Diagnosis not present

## 2018-03-30 DIAGNOSIS — C4442 Squamous cell carcinoma of skin of scalp and neck: Secondary | ICD-10-CM | POA: Diagnosis not present

## 2018-03-30 DIAGNOSIS — L570 Actinic keratosis: Secondary | ICD-10-CM | POA: Diagnosis not present

## 2018-04-01 ENCOUNTER — Encounter: Payer: Medicare Other | Admitting: Internal Medicine

## 2018-04-05 ENCOUNTER — Encounter: Payer: Self-pay | Admitting: *Deleted

## 2018-04-05 ENCOUNTER — Encounter

## 2018-04-05 ENCOUNTER — Encounter: Payer: Self-pay | Admitting: Vascular Surgery

## 2018-04-05 ENCOUNTER — Ambulatory Visit: Payer: Medicare Other | Admitting: Vascular Surgery

## 2018-04-05 ENCOUNTER — Other Ambulatory Visit: Payer: Self-pay

## 2018-04-05 DIAGNOSIS — I739 Peripheral vascular disease, unspecified: Secondary | ICD-10-CM | POA: Diagnosis not present

## 2018-04-05 NOTE — Progress Notes (Signed)
Patient name: [name] MRN: [id number] DOB: June 15, 1933 Sex: male  REASON FOR CONSULT: Abnormal ABIs, rest pain in left foot  HPI: [name] is a 84 y.o. male, with multiple medical comorbidities including CHF (EF 25%), COPD, hyperlipidemia, hypertension, diabetes, CKD that presents for evaluation of abnormal ABIs.  Today the patient endorses pain in his left foot that has been ongoing for several months.  He states that he has pain in left foot both when walking and at rest.  Describes more of a aching sensation in the lateral foot.  He was seen by Dr. Marta Antu with podiatry and ABIs were ordered that showed an abnormal ABI in the left foot of 0.56 with monophasic runoff at the ankle and ABI 0.8 on the right with monophasic runoff.  He really denies any significant rest pain in his right foot.  He does have bilateral lower extremity leg swelling and severely reduced EF.  States he is ambulatory.  He denies tobacco abuse.  He denies being on any blood thinners.  No previous lower extremity interventions.  Past Medical History:  Diagnosis Date  . Atrial flutter ([place])    (I could not find documentation of this rhythm.)  . AUTOMATIC IMPLANTABLE CARDIAC DEFIBRILLATOR SITU   . Automatic implantable cardioverter-defibrillator in situ 12/05/2009   Qualifier: Diagnosis of  By: [name], MD, [name]   . CAD (coronary artery disease) 05/22/2011  . Carotid stenosis 05/22/2011  . CHF CONGESTIVE HEART FAILURE    45% by echo 2015  . COPD 12/01/2007   Qualifier: Diagnosis of  By: [name] NP, [name]    . DM2 (diabetes mellitus, type 2) ([place])   . DYSLIPIDEMIA   . DYSPNEA   . Edema   . Essential hypertension 11/17/2007   Qualifier: Diagnosis of  By: [name]    . HYPERTENSION   . HYPOTHYROIDISM   . MYOCARDIAL INFARCTION    MI 1999, stent x 2 to RCA, 70% circ, 30 and 40 % LADs  . WEIGHT GAIN, ABNORMAL     Past Surgical History:  Procedure Laterality Date  . ANGIOPLASTY      stent  . APPENDECTOMY  1957  . CARDIAC DEFIBRILLATOR PLACEMENT    . CAROTID STENT    . EYE SURGERY  1985    Family History  Problem Relation Age of Onset  . Other Mother        natural causes  . Kidney disease Father   . Cancer Brother        throat cancer    SOCIAL HISTORY: Social History   Socioeconomic History  . Marital status: Married    Spouse name: Not on file  . Number of children: Not on file  . Years of education: Not on file  . Highest education level: Not on file  Occupational History  . Not on file  Social Needs  . Financial resource strain: Not on file  . Food insecurity:    Worry: Not on file    Inability: Not on file  . Transportation needs:    Medical: Not on file    Non-medical: Not on file  Tobacco Use  . Smoking status: Former Smoker    Last attempt to quit: 12/10/1983    Years since quitting: 34.3  . Smokeless tobacco: Never Used  . Tobacco comment: quit 1985  Substance and Sexual Activity  . Alcohol use: No  . Drug use: No  . Sexual activity: Not on file  Lifestyle  . Physical activity:    Days per week: Not on file    Minutes per session: Not on file  . Stress: Not on file  Relationships  . Social connections:    Talks on phone: Not on file    Gets together: Not on file    Attends religious service: Not on file    Active member of club or organization: Not on file    Attends meetings of clubs or organizations: Not on file    Relationship status: Not on file  . Intimate partner violence:    Fear of current or ex partner: Not on file    Emotionally abused: Not on file    Physically abused: Not on file    Forced sexual activity: Not on file  Other Topics Concern  . Not on file  Social History Narrative  . Not on file    Allergies  Allergen Reactions  . Prednisone Rash  . Sulfa Antibiotics     Hyper     Current Outpatient Medications  Medication Sig Dispense Refill  . aspirin 81 MG tablet Take 81 mg by mouth daily.     Marland Kitchen atorvastatin (LIPITOR) 40 MG tablet Take 1 tablet by mouth Daily.    . cyanocobalamin 1000 MCG tablet Take 1,000 mcg by mouth daily.    . furosemide (LASIX) 40 MG tablet Take 1 tablet (40 mg total) by mouth daily.    Marland Kitchen levothyroxine (SYNTHROID, LEVOTHROID) 150 MCG tablet Take 175 mcg by mouth daily before breakfast.     . metoprolol succinate (TOPROL-XL) 100 MG 24 hr tablet Take 1 tablet (100 mg total) by mouth daily.    . mirtazapine (REMERON) 15 MG tablet Take 15 mg by mouth daily.  3  . montelukast (SINGULAIR) 10 MG tablet Take 10 mg by mouth daily.  12  . omeprazole (PRILOSEC) 40 MG capsule Take 40 mg by mouth daily.    Marland Kitchen ACCU-CHEK AVIVA PLUS test strip     . fexofenadine (ALLEGRA) 180 MG tablet Take 180 mg by mouth daily.      Marland Kitchen FLOVENT HFA 110 MCG/ACT inhaler Inhale 2 puffs into the lungs 2 (two) times daily.  3  . fluticasone (FLONASE) 50 MCG/ACT nasal spray 2 sprays by Nasal route daily.      Marland Kitchen QVAR 80 MCG/ACT inhaler Take 2 puffs by mouth at bedtime.     No current facility-administered medications for this visit.     REVIEW OF SYSTEMS:  [X]  denotes positive finding, [ ]  denotes negative finding Cardiac  Comments:  Chest pain or chest pressure:    Shortness of breath upon exertion:    Short of breath when lying flat:    Irregular heart rhythm:        Vascular    Pain in calf, thigh, or hip brought on by ambulation: x   Pain in feet at night that wakes you up from your sleep:  x   Blood clot in your veins:    Leg swelling:  x       Pulmonary    Oxygen at home:    Productive cough:     Wheezing:         Neurologic    Sudden weakness in arms or legs:     Sudden numbness in arms or legs:     Sudden onset of difficulty speaking or slurred speech:    Temporary loss of vision in one eye:     Problems with  dizziness:         Gastrointestinal    Blood in stool:     Vomited blood:         Genitourinary    Burning when urinating:     Blood in urine:          Psychiatric    Major depression:         Hematologic    Bleeding problems:    Problems with blood clotting too easily:        Skin    Rashes or ulcers:        Constitutional    Fever or chills:      PHYSICAL EXAM: Vitals:   04/05/18 1011  BP: 134/62  Pulse: 67  Resp: 18  SpO2: 100%  Weight: 78.3 kg  Height: 5\' 7"  (1.702 m)    GENERAL: The patient is a well-nourished male, in no acute distress. The vital signs are documented above. CARDIAC: There is a regular rate and rhythm.  VASCULAR:  2+ radial pulse palpable bilateral upper extremities 2+ right femoral pulse palpable  1+ left femoral pulse palpable No palpable pedal pulse, monophasic signals Very small scab over left 5th metatarsal  2+ edema BLE with skin thickening PULMONARY: There is good air exchange bilaterally without wheezing or rales. ABDOMEN: Soft and non-tender with normal pitched bowel sounds.  MUSCULOSKELETAL: There are no major deformities or cyanosis. NEUROLOGIC: No focal weakness or paresthesias are detected. SKIN: There are no ulcers or rashes noted. PSYCHIATRIC: The patient has a normal affect.  DATA:   Noninvasive imaging from [facility] radiology shows an ABI of 0.8 in the right leg with monophasic runoff and an ABI of 0.56 in the left leg with monophasic runoff.  Assessment/Plan:  Suspect his symptoms are consistent with rest pain.  On exam patient has 2+ palpable right femoral pulse and a 1+ palpable left femoral pulse.  He has no palpable pedal pulses with monophasic runoff.  He only endorses rest pain in his left leg and has a very tiny scab/ulcer at the left fifth metatarsal that is largely unimpressive at this time.  His ABIs are lower on the left which correlates with his symptoms.  I recommended an aortogram with left lower extremity arteriogram and possible intervention.  We will need to use CO2 given his CKD for the majority of the case.  Will schedule for next available date.  He also  has significant leg swelling of bilateral legs that is probably related to severe reduced EF - could have component of venous insufficiency as well but discussed with family we will address arterial inflow first.    [name], MD [facility] Office: [phone] Pager: [place]

## 2018-04-07 ENCOUNTER — Encounter: Payer: Self-pay | Admitting: Podiatry

## 2018-04-08 DIAGNOSIS — Z9622 Myringotomy tube(s) status: Secondary | ICD-10-CM | POA: Diagnosis not present

## 2018-04-08 DIAGNOSIS — J342 Deviated nasal septum: Secondary | ICD-10-CM | POA: Diagnosis not present

## 2018-04-08 DIAGNOSIS — H6982 Other specified disorders of Eustachian tube, left ear: Secondary | ICD-10-CM | POA: Diagnosis not present

## 2018-05-02 DIAGNOSIS — I2510 Atherosclerotic heart disease of native coronary artery without angina pectoris: Secondary | ICD-10-CM | POA: Diagnosis not present

## 2018-05-02 DIAGNOSIS — E785 Hyperlipidemia, unspecified: Secondary | ICD-10-CM | POA: Diagnosis not present

## 2018-05-02 DIAGNOSIS — I420 Dilated cardiomyopathy: Secondary | ICD-10-CM | POA: Diagnosis not present

## 2018-05-02 DIAGNOSIS — G4700 Insomnia, unspecified: Secondary | ICD-10-CM | POA: Diagnosis not present

## 2018-05-02 DIAGNOSIS — E063 Autoimmune thyroiditis: Secondary | ICD-10-CM | POA: Diagnosis not present

## 2018-05-02 DIAGNOSIS — E118 Type 2 diabetes mellitus with unspecified complications: Secondary | ICD-10-CM | POA: Diagnosis not present

## 2018-05-04 ENCOUNTER — Encounter (HOSPITAL_COMMUNITY)
Admission: RE | Disposition: A | Discharge status: Home / Self Care | Payer: Self-pay | Source: Ambulatory Visit | Attending: Vascular Surgery

## 2018-05-04 ENCOUNTER — Telehealth: Payer: Self-pay | Admitting: Vascular Surgery

## 2018-05-04 ENCOUNTER — Other Ambulatory Visit: Payer: Self-pay

## 2018-05-04 ENCOUNTER — Ambulatory Visit (HOSPITAL_COMMUNITY)
Admission: RE | Admit: 2018-05-04 | Discharge: 2018-05-04 | Disposition: A | Discharge status: Home / Self Care | Payer: Medicare Other | Source: Ambulatory Visit | Attending: Vascular Surgery | Admitting: Vascular Surgery

## 2018-05-04 DIAGNOSIS — Z95810 Presence of automatic (implantable) cardiac defibrillator: Secondary | ICD-10-CM | POA: Insufficient documentation

## 2018-05-04 DIAGNOSIS — I130 Hypertensive heart and chronic kidney disease with heart failure and stage 1 through stage 4 chronic kidney disease, or unspecified chronic kidney disease: Secondary | ICD-10-CM | POA: Insufficient documentation

## 2018-05-04 DIAGNOSIS — I998 Other disorder of circulatory system: Secondary | ICD-10-CM | POA: Insufficient documentation

## 2018-05-04 DIAGNOSIS — Z87891 Personal history of nicotine dependence: Secondary | ICD-10-CM | POA: Insufficient documentation

## 2018-05-04 DIAGNOSIS — N189 Chronic kidney disease, unspecified: Secondary | ICD-10-CM | POA: Insufficient documentation

## 2018-05-04 DIAGNOSIS — Z79899 Other long term (current) drug therapy: Secondary | ICD-10-CM | POA: Insufficient documentation

## 2018-05-04 DIAGNOSIS — I252 Old myocardial infarction: Secondary | ICD-10-CM | POA: Diagnosis not present

## 2018-05-04 DIAGNOSIS — Z79890 Hormone replacement therapy (postmenopausal): Secondary | ICD-10-CM | POA: Diagnosis not present

## 2018-05-04 DIAGNOSIS — I70222 Atherosclerosis of native arteries of extremities with rest pain, left leg: Secondary | ICD-10-CM | POA: Diagnosis not present

## 2018-05-04 DIAGNOSIS — I2510 Atherosclerotic heart disease of native coronary artery without angina pectoris: Secondary | ICD-10-CM | POA: Insufficient documentation

## 2018-05-04 DIAGNOSIS — Z841 Family history of disorders of kidney and ureter: Secondary | ICD-10-CM | POA: Diagnosis not present

## 2018-05-04 DIAGNOSIS — E039 Hypothyroidism, unspecified: Secondary | ICD-10-CM | POA: Insufficient documentation

## 2018-05-04 DIAGNOSIS — J449 Chronic obstructive pulmonary disease, unspecified: Secondary | ICD-10-CM | POA: Diagnosis not present

## 2018-05-04 DIAGNOSIS — Z7951 Long term (current) use of inhaled steroids: Secondary | ICD-10-CM | POA: Insufficient documentation

## 2018-05-04 DIAGNOSIS — Z955 Presence of coronary angioplasty implant and graft: Secondary | ICD-10-CM | POA: Diagnosis not present

## 2018-05-04 DIAGNOSIS — I509 Heart failure, unspecified: Secondary | ICD-10-CM | POA: Diagnosis not present

## 2018-05-04 DIAGNOSIS — E1122 Type 2 diabetes mellitus with diabetic chronic kidney disease: Secondary | ICD-10-CM | POA: Diagnosis not present

## 2018-05-04 DIAGNOSIS — Z888 Allergy status to other drugs, medicaments and biological substances status: Secondary | ICD-10-CM | POA: Diagnosis not present

## 2018-05-04 DIAGNOSIS — I4892 Unspecified atrial flutter: Secondary | ICD-10-CM | POA: Diagnosis not present

## 2018-05-04 DIAGNOSIS — Z7982 Long term (current) use of aspirin: Secondary | ICD-10-CM | POA: Insufficient documentation

## 2018-05-04 DIAGNOSIS — E785 Hyperlipidemia, unspecified: Secondary | ICD-10-CM | POA: Diagnosis not present

## 2018-05-04 DIAGNOSIS — Z9889 Other specified postprocedural states: Secondary | ICD-10-CM | POA: Insufficient documentation

## 2018-05-04 DIAGNOSIS — Z882 Allergy status to sulfonamides status: Secondary | ICD-10-CM | POA: Diagnosis not present

## 2018-05-04 HISTORY — PX: PERIPHERAL VASCULAR INTERVENTION: CATH118257

## 2018-05-04 HISTORY — PX: LOWER EXTREMITY ANGIOGRAPHY: CATH118251

## 2018-05-04 LAB — POCT I-STAT, CHEM 8
BUN: 21 mg/dL (ref 8–23)
Calcium, Ion: 1.14 mmol/L — ABNORMAL LOW (ref 1.15–1.40)
Chloride: 103 mmol/L (ref 98–111)
Creatinine, Ser: 1.5 mg/dL — ABNORMAL HIGH (ref 0.61–1.24)
Glucose, Bld: 133 mg/dL — ABNORMAL HIGH (ref 70–99)
HCT: 33 % — ABNORMAL LOW (ref 39.0–52.0)
HEMOGLOBIN: 11.2 g/dL — AB (ref 13.0–17.0)
Potassium: 3.9 mmol/L (ref 3.5–5.1)
Sodium: 141 mmol/L (ref 135–145)
TCO2: 30 mmol/L (ref 22–32)

## 2018-05-04 LAB — GLUCOSE, CAPILLARY
GLUCOSE-CAPILLARY: 125 mg/dL — AB (ref 70–99)
GLUCOSE-CAPILLARY: 165 mg/dL — AB (ref 70–99)
Glucose-Capillary: 121 mg/dL — ABNORMAL HIGH (ref 70–99)

## 2018-05-04 LAB — POCT ACTIVATED CLOTTING TIME
Activated Clotting Time: 230 seconds
Activated Clotting Time: 241 seconds
Activated Clotting Time: 219 seconds
Activated Clotting Time: 186 seconds

## 2018-05-04 SURGERY — LOWER EXTREMITY ANGIOGRAPHY
Anesthesia: LOCAL

## 2018-05-04 MED ORDER — CLOPIDOGREL BISULFATE 75 MG PO TABS: 75.0000 mg | ORAL_TABLET | Freq: Every day | ORAL | Status: DC

## 2018-05-04 MED ORDER — SODIUM CHLORIDE 0.9 % IV SOLN
INTRAVENOUS | Status: DC
  Administered 2018-05-04: 08:00:00 via INTRAVENOUS

## 2018-05-04 MED ORDER — ONDANSETRON HCL 4 MG/2ML IJ SOLN: 4.0000 mg | Freq: Four times a day (QID) | INTRAMUSCULAR | Status: DC | PRN

## 2018-05-04 MED ORDER — HEPARIN (PORCINE) IN NACL 1000-0.9 UT/500ML-% IV SOLN
INTRAVENOUS | Status: AC
  Filled 2018-05-04: qty 1000

## 2018-05-04 MED ORDER — HEPARIN (PORCINE) IN NACL 1000-0.9 UT/500ML-% IV SOLN
INTRAVENOUS | Status: DC | PRN
  Administered 2018-05-04 (×2): 500 mL

## 2018-05-04 MED ORDER — MIDAZOLAM HCL 2 MG/2ML IJ SOLN
INTRAMUSCULAR | Status: DC | PRN
  Administered 2018-05-04: 1 mg via INTRAVENOUS

## 2018-05-04 MED ORDER — SODIUM CHLORIDE 0.9% FLUSH: 3.0000 mL | INTRAVENOUS | Status: DC | PRN

## 2018-05-04 MED ORDER — FENTANYL CITRATE (PF) 100 MCG/2ML IJ SOLN
INTRAMUSCULAR | Status: AC
  Filled 2018-05-04: qty 2

## 2018-05-04 MED ORDER — HEPARIN SODIUM (PORCINE) 1000 UNIT/ML IJ SOLN
INTRAMUSCULAR | Status: DC | PRN
  Administered 2018-05-04: 8000 [IU] via INTRAVENOUS
  Administered 2018-05-04: 2000 [IU] via INTRAVENOUS

## 2018-05-04 MED ORDER — CLOPIDOGREL BISULFATE 75 MG PO TABS: 75.0000 mg | ORAL_TABLET | Freq: Every day | ORAL | 11 refills | Status: DC

## 2018-05-04 MED ORDER — LIDOCAINE HCL (PF) 1 % IJ SOLN
INTRAMUSCULAR | Status: DC | PRN
  Administered 2018-05-04 (×2): 15 mL

## 2018-05-04 MED ORDER — ACETAMINOPHEN 325 MG PO TABS: 650.0000 mg | ORAL_TABLET | ORAL | Status: DC | PRN

## 2018-05-04 MED ORDER — FENTANYL CITRATE (PF) 100 MCG/2ML IJ SOLN
INTRAMUSCULAR | Status: DC | PRN
  Administered 2018-05-04: 25 ug via INTRAVENOUS

## 2018-05-04 MED ORDER — HYDRALAZINE HCL 20 MG/ML IJ SOLN: 5.0000 mg | INTRAMUSCULAR | Status: DC | PRN

## 2018-05-04 MED ORDER — LABETALOL HCL 5 MG/ML IV SOLN: 10.0000 mg | INTRAVENOUS | Status: DC | PRN

## 2018-05-04 MED ORDER — SODIUM CHLORIDE 0.9 % IV SOLN: 250.0000 mL | INTRAVENOUS | Status: DC | PRN

## 2018-05-04 MED ORDER — CLOPIDOGREL BISULFATE 300 MG PO TABS
ORAL_TABLET | ORAL | Status: DC | PRN
  Administered 2018-05-04: 300 mg via ORAL

## 2018-05-04 MED ORDER — LIDOCAINE HCL (PF) 1 % IJ SOLN
INTRAMUSCULAR | Status: AC
  Filled 2018-05-04: qty 30

## 2018-05-04 MED ORDER — SODIUM CHLORIDE 0.9 % WEIGHT BASED INFUSION: 1.0000 mL/kg/h | INTRAVENOUS | Status: DC

## 2018-05-04 MED ORDER — CLOPIDOGREL BISULFATE 75 MG PO TABS
300.0000 mg | ORAL_TABLET | Freq: Once | ORAL | Status: AC
  Administered 2018-05-04: 300 mg via ORAL

## 2018-05-04 MED ORDER — HEPARIN SODIUM (PORCINE) 1000 UNIT/ML IJ SOLN
INTRAMUSCULAR | Status: AC
  Filled 2018-05-04: qty 1

## 2018-05-04 MED ORDER — SODIUM CHLORIDE 0.9% FLUSH: 3.0000 mL | Freq: Two times a day (BID) | INTRAVENOUS | Status: DC

## 2018-05-04 MED ORDER — IODIXANOL 320 MG/ML IV SOLN
INTRAVENOUS | Status: DC | PRN
  Administered 2018-05-04: 115 mL via INTRAVENOUS

## 2018-05-04 MED ORDER — CLOPIDOGREL BISULFATE 300 MG PO TABS
ORAL_TABLET | ORAL | Status: AC
  Filled 2018-05-04: qty 1

## 2018-05-04 MED ORDER — MIDAZOLAM HCL 2 MG/2ML IJ SOLN
INTRAMUSCULAR | Status: AC
  Filled 2018-05-04: qty 2

## 2018-05-04 SURGICAL SUPPLY — 23 items
CATH ANGIO 5F BER2 65CM (CATHETERS) ×1 IMPLANT
CATH OMNI FLUSH 5F 65CM (CATHETERS) ×1 IMPLANT
CATH SOFT-VU 4F 65 STRAIGHT (CATHETERS) IMPLANT
CATH SOFT-VU STRAIGHT 4F 65CM (CATHETERS) ×3
CLOSURE MYNX CONTROL 6F/7F (Vascular Products) ×1 IMPLANT
DEVICE TORQUE .025-.038 (MISCELLANEOUS) ×1 IMPLANT
FILTER CO2 0.2 MICRON (VASCULAR PRODUCTS) ×2 IMPLANT
GUIDEWIRE ANGLED .035X150CM (WIRE) ×1 IMPLANT
KIT ENCORE 26 ADVANTAGE (KITS) ×2 IMPLANT
KIT MICROPUNCTURE NIT STIFF (SHEATH) ×1 IMPLANT
KIT PV (KITS) ×3 IMPLANT
RESERVOIR CO2 (VASCULAR PRODUCTS) ×2 IMPLANT
SET FLUSH CO2 (MISCELLANEOUS) ×2 IMPLANT
SHEATH BRITE TIP 7FR 35CM (SHEATH) ×3 IMPLANT
SHEATH PINNACLE 5F 10CM (SHEATH) ×1 IMPLANT
SHEATH PINNACLE 7F 10CM (SHEATH) ×1 IMPLANT
STENT VIABAHN 8X29X80 VBX (Permanent Stent) ×1 IMPLANT
STENT VIABAHN 8X39X80 VBX (Permanent Stent) ×1 IMPLANT
SYR MEDRAD MARK 7 150ML (SYRINGE) ×3 IMPLANT
TRANSDUCER W/STOPCOCK (MISCELLANEOUS) ×3 IMPLANT
TRAY PV CATH (CUSTOM PROCEDURE TRAY) ×3 IMPLANT
WIRE AMPLATZ SS-J .035X180CM (WIRE) ×2 IMPLANT
WIRE BENTSON .035X145CM (WIRE) ×1 IMPLANT

## 2018-05-04 NOTE — Op Note (Signed)
Patient name: [name] MRN: [id number] DOB: 1933-08-16 Sex: male  05/04/2018 Pre-operative Diagnosis: Critical limb ischemia of the left lower extremity with rest pain Post-operative diagnosis:  Same Surgeon:  [name], MD Procedure Performed: 1.  Ultrasound-guided access of the bilateral common femoral arteries 2.  CO2 aortogram 3.  Bilateral lower extremity arteriogram with runoff 4.  Bilateral common iliac artery angioplasty with bilateral kissing stent placement (8 mm x 29 mm VBX in the right common iliac artery and 8 mm x 39 mm VBX in the left common iliac artery) 5.  Mynx closure of the left common femoral artery 6.  Manual pressure for closure of the right common femoral artery 7.  76 minutes of monitored moderate conscious sedation time  Indications: Patient is an 84 year old male with multiple medical comorbidities including severely depressed EF of less than 25% who recently presented to clinic with critical limb ischemia of the left lower extremity with rest pain.  He presents today for aortogram and bilateral lower extremity arteriogram with plans for possible intervention of the left leg.  I clarified again with the patient prior to arteriogram and intervention today that he has no symptoms in his right leg currently.  Discussed risks and benefits with the patient in detail.   Findings: CO2 aortogram which required some contrast to get better pictures showed a heavily calcified infrarenal aorta with a greater than 90% stenosis of the left common iliac artery that was heavily calcified and occluded left hypogastric artery.  Ultimately the left external iliac, common femoral, profunda were all widely patent.  He did have a really diseased distal left SFA/above knee popliteal artery with 50-75% stenosis throughout its mid to distal course.  The left below knee popliteal artery was patent with what appeared to be three-vessel runoff with a patent anterior tibial,  peroneal, and posterior tibial artery.  On the right there was an approximate 50% stenosis of the common iliac artery with a patent hypogastric.  Patient had a heavily diseased right common femoral artery with multiple large calcific plaques with greater than 70% stenosis.  The right common femoral and profunda were patent the right proximal to mid SFA was patent but then the distal SFA occluded with reconstitution of the popliteal artery and then runoff via the peroneal and posterior tibial in the right lower extremity with a chronically occluded anterior tibial artery. After intervention bilateral common iliac arteries were widely patent with no residual stenosis and brisk emptying of contrast into the external iliac arteries.   Procedure:  The patient was identified in the holding area and taken to room 8.  The patient was then placed supine on the table and prepped and draped in the usual sterile fashion.  A time out was called.  Ultrasound was used to evaluate the right common femoral artery.  It was patent .  A digital ultrasound image was acquired.  A micropuncture needle was used to access the right common femoral artery under ultrasound guidance.  An 018 wire was advanced without resistance and a micropuncture sheath was placed.  The 018 wire was removed and a benson wire was placed.  We had to use a bare 2 catheter to get retrograde into the aorta. The micropuncture sheath was exchanged for a 5 french sheath.  An omniflush catheter was advanced over the wire to the level of L-1.  An abdominal angiogram was obtained with CO2.  Unfortunately given concern for iliac disease and overlying bowel gas we  had to use some contrast in the Omni flush catheter to get good images after pulling the catheter down to the aortic bifurcation.  Initial imaging showed what appeared to be a high-grade greater than 90% calcified stenosis with near occlusion of the left common iliac artery on his affected side with a patent  external iliac and occluded hypogastric.  At that point in time we went ahead and decided to shoot the runoff of both lower extremities and we then performed bilateral lower extremity arteriogram with pertinent findings noted above.  Given that the patient was only symptomatically in the left leg, I elected to treat his left common iliac disease to alleviate his rest pain.  At that point in time I then used an ultrasound to evaluate his left common femoral artery that was patent.  A digital ultrasound image was acquired.  Micro puncture needle was used to access the left common femoral artery using ultrasound guidance.  An 018 wire was advanced without resistance and a micro-sheath was placed.  018 wire was removed and a Bentson wire was placed and then replaced a long 7 French sheath in the left groin.  Ultimately this point time I then used a soft angled Glidewire and a KMP catheter to cross the left common iliac in retrograde fashion into the native aorta and I confirmed that I was in the true lumen with a brief hand-injection.  At that point in time I then gave 8000 units of IV heparin and ACTs were checked to maintain them greater than 250.  I then exchanged for Amplatz wire in the left groin over which I was able to advance a long 7 French sheath up into the native aorta.  I did again confirm with a brief retrograde injection that she that I was in the true lumen and across the calcified left common iliac disease.  I then used a Amplatz wire in the right groin and exchanged for a long 7 French sheath that was also pushed up above the iliac into the native aorta.  At that point in time we reviewed our previous imaging and selected 8 mm VBX stents bilaterally.  We then placed a 8 mm x 39 mm VBX in the left common iliac and then a 8 mm x 29 mm VBX in the right common iliac through our long sheath and then pulled the sheath back to ensure that we did not have to bareback the stents.  We did one other hand  injection confirmed given some movement of our wires that we are still in the appropriate position and we placed our stents without significantly raising the aortic bifurcation.  Both of these were then deployed to nominal pressure we went to 104mmHg on the right iliac stent and 16 mmHg in the left iliac stent to slightly oversize the left iliac stent given slightly larger artery.  Once we were in place with stents, we removed delivery device, and then we put a pigtail catheter up the right side and shot another aortogram that showed widely patent bilateral common iliac arteries with patent runoff to the external iliacs that was brisk.  There was no residual stenosis in the stents.  At that point in time satisfied with the results I did not intervene on the left SFA given that I felt like our intervention would hopefully get him out of rest pain and we could always come back at a later time.  We then used a Bentson wire in the left and  exchanged for a short 7 [place] sheath and deployed a mynx closure device.  I was not comfortable to place a mynx device in the right groin given heavily calcified common femoral disease and concern for failure of the device.  He will be taken to PACU where ACTs were monitored and then manual pressure will be held in the right groin.  Contrast: 105 mL  Condition: Stable     [name], MD [facility] Office: [phone] Pager: [place]

## 2018-05-04 NOTE — Telephone Encounter (Signed)
sch appt lvm mld ltr 05/31/18 9am IVC/iliac 10am ABI 06/07/2018 845am p/o MD

## 2018-05-04 NOTE — Progress Notes (Signed)
Site area: Right groin a 7 french arterial sheath was removed  Site Prior to Removal:  Level 0  Pressure Applied For 20 MINUTES    Bedrest Beginning at 1245p  Manual:   Yes.    Patient Status During Pull:  stable  Post Pull Groin Site:  Level 0  Post Pull Instructions Given:  Yes.    Post Pull Pulses Present:  Yes.    Dressing Applied:  Yes.    Comments:  VS remain stable ]

## 2018-05-04 NOTE — Telephone Encounter (Signed)
-----   Message from Mena Goes, RN sent at 05/04/2018 11:54 AM EST ----- Regarding: 1 MONTH WITH 2 LABS   ----- Message ----- From: [name], MD Sent: 05/04/2018  11:01 AM EST To: [facility] Charge Pool  Patient name: [name]         MRN: [id number]        DOB: 1933/07/25          Sex: male  05/04/2018 Pre-operative Diagnosis: Critical limb ischemia of the left lower extremity with rest pain Post-operative diagnosis:  Same Surgeon:  [name], MD Procedure Performed: 1.  Ultrasound-guided access of the bilateral common femoral arteries 2.  CO2 aortogram 3.  Bilateral lower extremity arteriogram with runoff 4.  Bilateral common iliac artery angioplasty with bilateral kissing stent placement (8 mm x 29 mm VBX in the right common iliac artery and 8 mm x 39 mm VBX in the left common iliac artery) 5.  Mynx closure of the left common femoral artery 6.  Manual pressure for closure of the right common femoral artery 7.  76 minutes of monitored moderate conscious sedation time  #He can follow-up with me in one month with bilateral iliac artery duplex and ABI's.  Thanks,  Gerald Stabs

## 2018-05-04 NOTE — Progress Notes (Signed)
Client up and walked to bathroom; bilat groins puffy and left groin bruised; client c/o pain when groin is palpated; Dr Donnetta Hutching in to see client and ok to D/C home

## 2018-05-04 NOTE — H&P (Signed)
History and Physical Interval Note:  05/04/2018 7:51 AM  [name]  has presented today for surgery, with the diagnosis of rest pain  The various methods of treatment have been discussed with the patient and family. After consideration of risks, benefits and other options for treatment, the patient has consented to  Procedure(s): LOWER EXTREMITY ANGIOGRAPHY (N/A) as a surgical intervention .  The patient's history has been reviewed, patient examined, no change in status, stable for surgery.  I have reviewed the patient's chart and labs.  Questions were answered to the patient's satisfaction.    Aortogram, lower extremity arteriogram, left foot rest pain.  [name]  REASON FOR CONSULT: Abnormal ABIs, rest pain in left foot  HPI: [name] is a 84 y.o. male, with multiple medical comorbidities including CHF (EF 25%), COPD, hyperlipidemia, hypertension, diabetes, CKD that presents for evaluation of abnormal ABIs.  Today the patient endorses pain in his left foot that has been ongoing for several months.  He states that he has pain in left foot both when walking and at rest.  Describes more of a aching sensation in the lateral foot.  He was seen by Dr. Marta Antu with podiatry and ABIs were ordered that showed an abnormal ABI in the left foot of 0.56 with monophasic runoff at the ankle and ABI 0.8 on the right with monophasic runoff.  He really denies any significant rest pain in his right foot.  He does have bilateral lower extremity leg swelling and severely reduced EF.  States he is ambulatory.  He denies tobacco abuse.  He denies being on any blood thinners.  No previous lower extremity interventions.      Past Medical History:  Diagnosis Date  . Atrial flutter ([place])    (I could not find documentation of this rhythm.)  . AUTOMATIC IMPLANTABLE CARDIAC DEFIBRILLATOR SITU   . Automatic implantable cardioverter-defibrillator in situ 12/05/2009   Qualifier: Diagnosis  of  By: [name], MD, [name]   . CAD (coronary artery disease) 05/22/2011  . Carotid stenosis 05/22/2011  . CHF CONGESTIVE HEART FAILURE    45% by echo 2015  . COPD 12/01/2007   Qualifier: Diagnosis of  By: [name] NP, [name]    . DM2 (diabetes mellitus, type 2) ([place])   . DYSLIPIDEMIA   . DYSPNEA   . Edema   . Essential hypertension 11/17/2007   Qualifier: Diagnosis of  By: [name]    . HYPERTENSION   . HYPOTHYROIDISM   . MYOCARDIAL INFARCTION    MI 1999, stent x 2 to RCA, 70% circ, 30 and 40 % LADs  . WEIGHT GAIN, ABNORMAL          Past Surgical History:  Procedure Laterality Date  . ANGIOPLASTY     stent  . APPENDECTOMY  1957  . CARDIAC DEFIBRILLATOR PLACEMENT    . CAROTID STENT    . EYE SURGERY  1985         Family History  Problem Relation Age of Onset  . Other Mother        natural causes  . Kidney disease Father   . Cancer Brother        throat cancer    SOCIAL HISTORY: Social History        Socioeconomic History  . Marital status: Married    Spouse name: Not on file  . Number of children: Not on file  . Years of education: Not on file  . Highest  education level: Not on file  Occupational History  . Not on file  Social Needs  . Financial resource strain: Not on file  . Food insecurity:    Worry: Not on file    Inability: Not on file  . Transportation needs:    Medical: Not on file    Non-medical: Not on file  Tobacco Use  . Smoking status: Former Smoker    Last attempt to quit: 12/10/1983    Years since quitting: 34.3  . Smokeless tobacco: Never Used  . Tobacco comment: quit 1985  Substance and Sexual Activity  . Alcohol use: No  . Drug use: No  . Sexual activity: Not on file  Lifestyle  . Physical activity:    Days per week: Not on file    Minutes per session: Not on file  . Stress: Not on file  Relationships  . Social connections:    Talks on phone: Not on file     Gets together: Not on file    Attends religious service: Not on file    Active member of club or organization: Not on file    Attends meetings of clubs or organizations: Not on file    Relationship status: Not on file  . Intimate partner violence:    Fear of current or ex partner: Not on file    Emotionally abused: Not on file    Physically abused: Not on file    Forced sexual activity: Not on file  Other Topics Concern  . Not on file  Social History Narrative  . Not on file         Allergies  Allergen Reactions  . Prednisone Rash  . Sulfa Antibiotics     Hyper           Current Outpatient Medications  Medication Sig Dispense Refill  . aspirin 81 MG tablet Take 81 mg by mouth daily.    Marland Kitchen atorvastatin (LIPITOR) 40 MG tablet Take 1 tablet by mouth Daily.    . cyanocobalamin 1000 MCG tablet Take 1,000 mcg by mouth daily.    . furosemide (LASIX) 40 MG tablet Take 1 tablet (40 mg total) by mouth daily.    Marland Kitchen levothyroxine (SYNTHROID, LEVOTHROID) 150 MCG tablet Take 175 mcg by mouth daily before breakfast.     . metoprolol succinate (TOPROL-XL) 100 MG 24 hr tablet Take 1 tablet (100 mg total) by mouth daily.    . mirtazapine (REMERON) 15 MG tablet Take 15 mg by mouth daily.  3  . montelukast (SINGULAIR) 10 MG tablet Take 10 mg by mouth daily.  12  . omeprazole (PRILOSEC) 40 MG capsule Take 40 mg by mouth daily.    Marland Kitchen ACCU-CHEK AVIVA PLUS test strip     . fexofenadine (ALLEGRA) 180 MG tablet Take 180 mg by mouth daily.      Marland Kitchen FLOVENT HFA 110 MCG/ACT inhaler Inhale 2 puffs into the lungs 2 (two) times daily.  3  . fluticasone (FLONASE) 50 MCG/ACT nasal spray 2 sprays by Nasal route daily.      Marland Kitchen QVAR 80 MCG/ACT inhaler Take 2 puffs by mouth at bedtime.     No current facility-administered medications for this visit.     REVIEW OF SYSTEMS:  [X]  denotes positive finding, [ ]  denotes negative finding Cardiac  Comments:  Chest  pain or chest pressure:    Shortness of breath upon exertion:    Short of breath when lying flat:    Irregular heart rhythm:  Vascular    Pain in calf, thigh, or hip brought on by ambulation: x   Pain in feet at night that wakes you up from your sleep:  x   Blood clot in your veins:    Leg swelling:  x       Pulmonary    Oxygen at home:    Productive cough:     Wheezing:         Neurologic    Sudden weakness in arms or legs:     Sudden numbness in arms or legs:     Sudden onset of difficulty speaking or slurred speech:    Temporary loss of vision in one eye:     Problems with dizziness:         Gastrointestinal    Blood in stool:     Vomited blood:         Genitourinary    Burning when urinating:     Blood in urine:        Psychiatric    Major depression:         Hematologic    Bleeding problems:    Problems with blood clotting too easily:        Skin    Rashes or ulcers:        Constitutional    Fever or chills:      PHYSICAL EXAM:    Vitals:   04/05/18 1011  BP: 134/62  Pulse: 67  Resp: 18  SpO2: 100%  Weight: 78.3 kg  Height: 5\' 7"  (1.702 m)    GENERAL: The patient is a well-nourished male, in no acute distress. The vital signs are documented above. CARDIAC: There is a regular rate and rhythm.  VASCULAR:  2+ radial pulse palpable bilateral upper extremities 2+ right femoral pulse palpable  1+ left femoral pulse palpable No palpable pedal pulse, monophasic signals Very small scab over left 5th metatarsal  2+ edema BLE with skin thickening PULMONARY: There is good air exchange bilaterally without wheezing or rales. ABDOMEN: Soft and non-tender with normal pitched bowel sounds.  MUSCULOSKELETAL: There are no major deformities or cyanosis. NEUROLOGIC: No focal weakness or paresthesias are detected. SKIN: There are no ulcers or rashes  noted. PSYCHIATRIC: The patient has a normal affect.  DATA:   Noninvasive imaging from [facility] radiology shows an ABI of 0.8 in the right leg with monophasic runoff and an ABI of 0.56 in the left leg with monophasic runoff.  Assessment/Plan:  Suspect his symptoms are consistent with rest pain.  On exam patient has 2+ palpable right femoral pulse and a 1+ palpable left femoral pulse.  He has no palpable pedal pulses with monophasic runoff.  He only endorses rest pain in his left leg and has a very tiny scab/ulcer at the left fifth metatarsal that is largely unimpressive at this time.  His ABIs are lower on the left which correlates with his symptoms.  I recommended an aortogram with left lower extremity arteriogram and possible intervention.  We will need to use CO2 given his CKD for the majority of the case.  Will schedule for next available date.  He also has significant leg swelling of bilateral legs that is probably related to severe reduced EF - could have component of venous insufficiency as well but discussed with family we will address arterial inflow first.    [name], MD [facility] Office: [phone] Pager: [place]

## 2018-05-04 NOTE — Discharge Instructions (Signed)
Femoral Site Care °Refer to this sheet in the next few weeks. These instructions provide you with information about caring for yourself after your procedure. Your health care provider may also give you more specific instructions. Your treatment has been planned according to current medical practices, but problems sometimes occur. Call your health care provider if you have any problems or questions after your procedure. °What can I expect after the procedure? °After your procedure, it is typical to have the following: °· Bruising at the site that usually fades within 1-2 weeks. °· Blood collecting in the tissue (hematoma) that may be painful to the touch. It should usually decrease in size and tenderness within 1-2 weeks. ° °Follow these instructions at home: °· Take medicines only as directed by your health care provider. °· You may shower 24-48 hours after the procedure or as directed by your health care provider. Remove the bandage (dressing) and gently wash the site with plain soap and water. Pat the area dry with a clean towel. Do not rub the site, because this may cause bleeding. °· Do not take baths, swim, or use a hot tub until your health care provider approves. °· Check your insertion site every day for redness, swelling, or drainage. °· Do not apply powder or lotion to the site. °· Limit use of stairs to twice a day for the first 2-3 days or as directed by your health care provider. °· Do not squat for the first 2-3 days or as directed by your health care provider. °· Do not lift over 10 lb (4.5 kg) for 5 days after your procedure or as directed by your health care provider. °· Ask your health care provider when it is okay to: °? Return to work or school. °? Resume usual physical activities or sports. °? Resume sexual activity. °· Do not drive home if you are discharged the same day as the procedure. Have someone else drive you. °· You may drive 24 hours after the procedure unless otherwise instructed by  your health care provider. °· Do not operate machinery or power tools for 24 hours after the procedure or as directed by your health care provider. °· If your procedure was done as an outpatient procedure, which means that you went home the same day as your procedure, a responsible adult should be with you for the first 24 hours after you arrive home. °· Keep all follow-up visits as directed by your health care provider. This is important. °Contact a health care provider if: °· You have a fever. °· You have chills. °· You have increased bleeding from the site. Hold pressure on the site. °Get help right away if: °· You have unusual pain at the site. °· You have redness, warmth, or swelling at the site. °· You have drainage (other than a small amount of blood on the dressing) from the site. °· The site is bleeding, and the bleeding does not stop after 30 minutes of holding steady pressure on the site. °· Your leg or foot becomes pale, cool, tingly, or numb. °This information is not intended to replace advice given to you by your health care provider. Make sure you discuss any questions you have with your health care provider. °Document Released: 01/19/2014 Document Revised: 10/24/2015 Document Reviewed: 12/05/2013 °Elsevier Interactive Patient Education © 2018 [employer] °Moderate Conscious Sedation, Adult, Care After °These instructions provide you with information about caring for yourself after your procedure. Your health care provider may also give you more   specific instructions. Your treatment has been planned according to current medical practices, but problems sometimes occur. Call your health care provider if you have any problems or questions after your procedure. °What can I expect after the procedure? °After your procedure, it is common: °· To feel sleepy for several hours. °· To feel clumsy and have poor balance for several hours. °· To have poor judgment for several hours. °· To vomit if you eat too  soon. ° °Follow these instructions at home: °For at least 24 hours after the procedure: ° °· Do not: °? Participate in activities where you could fall or become injured. °? Drive. °? Use heavy machinery. °? Drink alcohol. °? Take sleeping pills or medicines that cause drowsiness. °? Make important decisions or sign legal documents. °? Take care of children on your own. °· Rest. °Eating and drinking °· Follow the diet recommended by your health care provider. °· If you vomit: °? Drink water, juice, or soup when you can drink without vomiting. °? Make sure you have little or no nausea before eating solid foods. °General instructions °· Have a responsible adult stay with you until you are awake and alert. °· Take over-the-counter and prescription medicines only as told by your health care provider. °· If you smoke, do not smoke without supervision. °· Keep all follow-up visits as told by your health care provider. This is important. °Contact a health care provider if: °· You keep feeling nauseous or you keep vomiting. °· You feel light-headed. °· You develop a rash. °· You have a fever. °Get help right away if: °· You have trouble breathing. °This information is not intended to replace advice given to you by your health care provider. Make sure you discuss any questions you have with your health care provider. °Document Released: 03/08/2013 Document Revised: 10/21/2015 Document Reviewed: 09/07/2015 °Elsevier Interactive Patient Education © 2018 [employer] ° °

## 2018-05-04 NOTE — Progress Notes (Addendum)
After client dressed c/o feeling "like I'm going to pass out" client reclined in bed; v/s stable; bilat groins soft and no c/o pain; Dr Donnetta Hutching notified of client's complaint and per Dr Early client up and walked and states "feels better" B/P sitting 118/63;

## 2018-05-04 NOTE — Progress Notes (Signed)
Dr Carlis Abbott in and checked bilateral groins

## 2018-05-05 ENCOUNTER — Encounter (HOSPITAL_COMMUNITY): Payer: Self-pay | Admitting: Vascular Surgery

## 2018-05-09 ENCOUNTER — Other Ambulatory Visit: Payer: Self-pay

## 2018-05-09 DIAGNOSIS — I739 Peripheral vascular disease, unspecified: Secondary | ICD-10-CM

## 2018-05-17 ENCOUNTER — Ambulatory Visit (INDEPENDENT_AMBULATORY_CARE_PROVIDER_SITE_OTHER): Payer: Medicare Other

## 2018-05-17 DIAGNOSIS — I255 Ischemic cardiomyopathy: Secondary | ICD-10-CM | POA: Diagnosis not present

## 2018-05-17 NOTE — Progress Notes (Signed)
Remote ICD transmission.   

## 2018-05-18 ENCOUNTER — Encounter: Payer: Self-pay | Admitting: Cardiology

## 2018-05-31 ENCOUNTER — Ambulatory Visit (HOSPITAL_COMMUNITY)
Admission: RE | Admit: 2018-05-31 | Discharge: 2018-05-31 | Disposition: A | Discharge status: Home / Self Care | Payer: Medicare Other | Source: Ambulatory Visit | Attending: Vascular Surgery | Admitting: Vascular Surgery

## 2018-05-31 ENCOUNTER — Ambulatory Visit (INDEPENDENT_AMBULATORY_CARE_PROVIDER_SITE_OTHER)
Admission: RE | Admit: 2018-05-31 | Discharge: 2018-05-31 | Disposition: A | Discharge status: Home / Self Care | Payer: Medicare Other | Source: Ambulatory Visit | Attending: Vascular Surgery | Admitting: Vascular Surgery

## 2018-05-31 DIAGNOSIS — I739 Peripheral vascular disease, unspecified: Secondary | ICD-10-CM | POA: Diagnosis not present

## 2018-06-07 ENCOUNTER — Encounter: Payer: Self-pay | Admitting: Vascular Surgery

## 2018-06-07 ENCOUNTER — Ambulatory Visit (INDEPENDENT_AMBULATORY_CARE_PROVIDER_SITE_OTHER): Payer: Medicare Other | Admitting: Vascular Surgery

## 2018-06-07 ENCOUNTER — Other Ambulatory Visit: Payer: Self-pay

## 2018-06-07 VITALS — BP 133/64 | HR 63 | Resp 18 | Ht 67.0 in | Wt 174.0 lb

## 2018-06-07 DIAGNOSIS — I739 Peripheral vascular disease, unspecified: Secondary | ICD-10-CM | POA: Diagnosis not present

## 2018-06-07 NOTE — Progress Notes (Signed)
Patient name: [name] MRN: [id number] DOB: 1933/07/11 Sex: male  REASON FOR VISIT: Follow-up after iliac stenting  HPI: [name] is a 85 y.o. male with multiple medical problems including CHF (EF 25%), COPD,hyperlipidemia, hypertension, diabetes, CKD that presents for interval follow-up after bilateral kissing iliac stents on 05/04/2018.  He was initially seen for some atypical rest pain symptoms in his left lateral foot with reduced ABIs.  Ultimately he underwent kissing iliac VBX stent placement.  He had a greater than 90% stenosis of the left common iliac artery with no residual stenosis after intervention.  His duplex today shows that his iliac stents are widely patent.  Overall he has not seen any significant improvement.  His main complaint today is bilateral lower extremity leg swelling.  He is scheduled to see his cardiologist on Thursday and states his last medication he could not tolerate because it dropped his blood pressure for his heart failure control.  Past Medical History:  Diagnosis Date  . Atrial flutter ([place])    (I could not find documentation of this rhythm.)  . AUTOMATIC IMPLANTABLE CARDIAC DEFIBRILLATOR SITU   . Automatic implantable cardioverter-defibrillator in situ 12/05/2009   Qualifier: Diagnosis of  By: [name], MD, [name]   . CAD (coronary artery disease) 05/22/2011  . Carotid stenosis 05/22/2011  . CHF CONGESTIVE HEART FAILURE    45% by echo 2015  . COPD 12/01/2007   Qualifier: Diagnosis of  By: [name] NP, [name]    . DM2 (diabetes mellitus, type 2) ([place])   . DYSLIPIDEMIA   . DYSPNEA   . Edema   . Essential hypertension 11/17/2007   Qualifier: Diagnosis of  By: [name]    . HYPERTENSION   . HYPOTHYROIDISM   . MYOCARDIAL INFARCTION    MI 1999, stent x 2 to RCA, 70% circ, 30 and 40 % LADs  . WEIGHT GAIN, ABNORMAL     Past Surgical History:  Procedure Laterality Date  . ANGIOPLASTY     stent  . APPENDECTOMY  1957  .  CARDIAC DEFIBRILLATOR PLACEMENT    . CAROTID STENT    . EYE SURGERY  1985  . LOWER EXTREMITY ANGIOGRAPHY N/A 05/04/2018   Procedure: LOWER EXTREMITY ANGIOGRAPHY;  Surgeon: [name], MD;  Location: [facility];  Service: Cardiovascular;  Laterality: N/A;  . PERIPHERAL VASCULAR INTERVENTION Bilateral 05/04/2018   Procedure: PERIPHERAL VASCULAR INTERVENTION;  Surgeon: [name], MD;  Location: [facility];  Service: Cardiovascular;  Laterality: Bilateral;  Iliacs    Family History  Problem Relation Age of Onset  . Other Mother        natural causes  . Kidney disease Father   . Cancer Brother        throat cancer    SOCIAL HISTORY: Social History   Tobacco Use  . Smoking status: Former Smoker    Last attempt to quit: 12/10/1983    Years since quitting: 34.5  . Smokeless tobacco: Never Used  . Tobacco comment: quit 1985  Substance Use Topics  . Alcohol use: No    Allergies  Allergen Reactions  . Prednisone Rash  . Sulfa Antibiotics Other (See Comments)    Hyper     Current Outpatient Medications  Medication Sig Dispense Refill  . ACCU-CHEK AVIVA PLUS test strip     . aspirin 81 MG tablet Take 81 mg by mouth daily.    Marland Kitchen atorvastatin (LIPITOR) 40 MG tablet Take 40 mg  by mouth Daily.     . clopidogrel (PLAVIX) 75 MG tablet Take 1 tablet (75 mg total) by mouth daily. 30 tablet 11  . cyanocobalamin 1000 MCG tablet Take 1,000 mcg by mouth daily.    Marland Kitchen FLOVENT HFA 110 MCG/ACT inhaler Inhale 2 puffs into the lungs 2 (two) times daily.  3  . fluticasone (FLONASE) 50 MCG/ACT nasal spray 2 sprays by Nasal route daily.      . furosemide (LASIX) 40 MG tablet Take 1 tablet (40 mg total) by mouth daily. (Patient taking differently: Take 80 mg by mouth daily. )    . levothyroxine (SYNTHROID, LEVOTHROID) 150 MCG tablet Take 150 mcg by mouth daily before breakfast.     . metoprolol succinate (TOPROL-XL) 100 MG 24 hr tablet Take 1 tablet (100 mg total) by  mouth daily.    . mirtazapine (REMERON) 15 MG tablet Take 15 mg by mouth at bedtime.   3  . montelukast (SINGULAIR) 10 MG tablet Take 10 mg by mouth at bedtime.   12  . omeprazole (PRILOSEC) 40 MG capsule Take 40 mg by mouth daily.    [name] Glycol-Propyl Glycol (SYSTANE OP) Place 1 drop into both eyes daily as needed (dry eyes).    Marland Kitchen QVAR 80 MCG/ACT inhaler Take 2 puffs by mouth 2 (two) times daily as needed (shortness of breath).      No current facility-administered medications for this visit.     REVIEW OF SYSTEMS:  [X]  denotes positive finding, [ ]  denotes negative finding Cardiac  Comments:  Chest pain or chest pressure:    Shortness of breath upon exertion:    Short of breath when lying flat:    Irregular heart rhythm:        Vascular    Pain in calf, thigh, or hip brought on by ambulation:    Pain in feet at night that wakes you up from your sleep:     Blood clot in your veins:    Leg swelling:  x       Pulmonary    Oxygen at home:    Productive cough:     Wheezing:         Neurologic    Sudden weakness in arms or legs:     Sudden numbness in arms or legs:     Sudden onset of difficulty speaking or slurred speech:    Temporary loss of vision in one eye:     Problems with dizziness:         Gastrointestinal    Blood in stool:     Vomited blood:         Genitourinary    Burning when urinating:     Blood in urine:        Psychiatric    Major depression:         Hematologic    Bleeding problems:    Problems with blood clotting too easily:        Skin    Rashes or ulcers:        Constitutional    Fever or chills:      PHYSICAL EXAM: Vitals:   06/07/18 0848  BP: 133/64  Pulse: 63  Resp: 18  SpO2: 100%  Weight: 174 lb (78.9 kg)  Height: 5\' 7"  (1.702 m)    GENERAL: The patient is a well-nourished male, in no acute distress. The vital signs are documented above. CARDIAC: There is a regular rate and rhythm.  VASCULAR:  2+ femoral pulse  palpable bilateral groins DP/PT signal left foot 2+ marked edema of bilateral lower extremities No overt tissue loss PULMONARY: There is good air exchange bilaterally without wheezing or rales. ABDOMEN: Soft and non-tender with normal pitched bowel sounds.  MUSCULOSKELETAL: There are no major deformities or cyanosis. NEUROLOGIC: No focal weakness or paresthesias are detected. SKIN: There are no ulcers or rashes noted. PSYCHIATRIC: The patient has a normal affect.  DATA:   Iliac stents widely patent with no focal stenosis.  Unable to calculate ABIs given pain at his ankle related to his leg swelling.  Monophasic runoff.  Assessment/Plan:  Had a long discussion with Mr. Dolney and his daughter regarding the next step forward.  He had bilateral kissing iliac stents done in early December for a greater than 90% stenosis of the left common iliac artery and had a nice result as demonstrated on arteriogram.  His noninvasive imaging today suggest that the iliac stents are widely patent.  He has palpable femoral pulses on exam.  He has not seen any significant improvement in his left lateral foot pain.  His main complaint today is leg swelling in both legs and he has heart failure with EF<25%.  I reiterated that any further intervention on his arteries is not going to improve his leg swelling.  He does have a heavily calcified distal SFA above-knee popliteal artery with high-grade stenosis and discussed that this would be the next location for intervention if we decided to move forward.  I think ultimately we have decided that we will wait for him to see his cardiologist at the end of this week to see if there is any other options to help manage his leg swelling in the setting of his heart failure.  He is already elevating his legs and wearing compression hose and doing appropriate conservative measures.  I will plan to see him back in 3 months with iliac duplex and repeat ABIs and discussed if his  symptoms worsen we can plan to intervene sooner.  He has no tissue loss at this time and again he has these atypical symptom of his left lateral foot that is intermittent.   [name], MD [facility] Office: [phone] Pager: [place]

## 2018-06-09 ENCOUNTER — Ambulatory Visit (HOSPITAL_COMMUNITY)
Admission: RE | Admit: 2018-06-09 | Discharge: 2018-06-09 | Disposition: A | Discharge status: Home / Self Care | Payer: Medicare Other | Source: Ambulatory Visit | Attending: Cardiovascular Disease | Admitting: Cardiovascular Disease

## 2018-06-09 DIAGNOSIS — Z87891 Personal history of nicotine dependence: Secondary | ICD-10-CM | POA: Diagnosis not present

## 2018-06-09 DIAGNOSIS — R0989 Other specified symptoms and signs involving the circulatory and respiratory systems: Secondary | ICD-10-CM | POA: Diagnosis not present

## 2018-06-12 NOTE — Progress Notes (Signed)
HPI The patient presents for followup of his coronary disease and cardiomyopathy.   At a recent visit his EF was lower than previous at 25%.   Since I last saw him he has had bilateral common iliac artery angioplasty with kissing stent placement.  Unfortunately he has no significant improvement in left lateral foot pain.  He has heavily calcified distal SFA above the knee popliteal stenosis and Dr. Carlis Abbott would consider possible PCI of this.  He was going to discuss this with the patient after further imaging in early April.  The patient unfortunately has continued discomfort in the foot that somewhat keeps him up at night.  He also has some erythema and tenderness in the dorsum of the foot at this point.  He is actually not describing shortness of breath, PND or orthopnea.  Is not been having any palpitations, presyncope or syncope.  He has had no weight gain or edema.    Allergies  Allergen Reactions  . Prednisone Rash  . Sulfa Antibiotics Other (See Comments)    Hyper     Current Outpatient Medications  Medication Sig Dispense Refill  . ACCU-CHEK AVIVA PLUS test strip     . aspirin 81 MG tablet Take 81 mg by mouth daily.    Marland Kitchen atorvastatin (LIPITOR) 40 MG tablet Take 40 mg by mouth Daily.     . clopidogrel (PLAVIX) 75 MG tablet Take 1 tablet (75 mg total) by mouth daily. 30 tablet 11  . cyanocobalamin 1000 MCG tablet Take 1,000 mcg by mouth daily.    Marland Kitchen FLOVENT HFA 110 MCG/ACT inhaler Inhale 2 puffs into the lungs 2 (two) times daily.  3  . fluticasone (FLONASE) 50 MCG/ACT nasal spray 2 sprays by Nasal route daily.      . furosemide (LASIX) 40 MG tablet Take 1 tablet (40 mg total) by mouth daily. (Patient taking differently: Take 80 mg by mouth daily. )    . levothyroxine (SYNTHROID, LEVOTHROID) 150 MCG tablet Take 150 mcg by mouth daily before breakfast.     . metoprolol succinate (TOPROL-XL) 100 MG 24 hr tablet Take 1 tablet (100 mg total) by mouth daily.    . mirtazapine (REMERON)  15 MG tablet Take 15 mg by mouth at bedtime.   3  . montelukast (SINGULAIR) 10 MG tablet Take 10 mg by mouth at bedtime.   12  . omeprazole (PRILOSEC) 40 MG capsule Take 40 mg by mouth daily.    [name] Glycol-Propyl Glycol (SYSTANE OP) Place 1 drop into both eyes daily as needed (dry eyes).    Marland Kitchen QVAR 80 MCG/ACT inhaler Take 2 puffs by mouth 2 (two) times daily as needed (shortness of breath).     . losartan (COZAAR) 25 MG tablet Take 1 tablet (25 mg total) by mouth daily. 90 tablet 3   No current facility-administered medications for this visit.     Past Medical History:  Diagnosis Date  . Atrial flutter ([place])    (I could not find documentation of this rhythm.)  . AUTOMATIC IMPLANTABLE CARDIAC DEFIBRILLATOR SITU   . Automatic implantable cardioverter-defibrillator in situ 12/05/2009   Qualifier: Diagnosis of  By: [name], MD, [name]   . CAD (coronary artery disease) 05/22/2011  . Carotid stenosis 05/22/2011  . CHF CONGESTIVE HEART FAILURE    45% by echo 2015  . COPD 12/01/2007   Qualifier: Diagnosis of  By: [name] NP, [name]    . DM2 (diabetes mellitus, type 2) ([place])   .  DYSLIPIDEMIA   . DYSPNEA   . Edema   . Essential hypertension 11/17/2007   Qualifier: Diagnosis of  By: [name]    . HYPERTENSION   . HYPOTHYROIDISM   . MYOCARDIAL INFARCTION    MI 1999, stent x 2 to RCA, 70% circ, 30 and 40 % LADs  . WEIGHT GAIN, ABNORMAL     Past Surgical History:  Procedure Laterality Date  . ANGIOPLASTY     stent  . APPENDECTOMY  1957  . CARDIAC DEFIBRILLATOR PLACEMENT    . CAROTID STENT    . EYE SURGERY  1985  . LOWER EXTREMITY ANGIOGRAPHY N/A 05/04/2018   Procedure: LOWER EXTREMITY ANGIOGRAPHY;  Surgeon: [name], MD;  Location: [facility];  Service: Cardiovascular;  Laterality: N/A;  . PERIPHERAL VASCULAR INTERVENTION Bilateral 05/04/2018   Procedure: PERIPHERAL VASCULAR INTERVENTION;  Surgeon: [name], MD;  Location: [facility];  Service: Cardiovascular;  Laterality: Bilateral;  Iliacs   ROS:   As stated in the HPI and negative for all other systems.  PHYSICAL EXAM BP 124/60   Pulse 69   Ht 5\' 8"  (1.727 m)   Wt 178 lb 12.8 oz (81.1 kg)   SpO2 98%   BMI 27.19 kg/m   GENERAL:  Well appearing NECK:  No jugular venous distention, waveform within normal limits, carotid upstroke brisk and symmetric, no bruits, no thyromegaly LUNGS:  Clear to auscultation bilaterally CHEST:  Well healed ICD pocket.   HEART:  PMI not displaced or sustained,S1 and S2 within normal limits, no S3, no S4, no clicks, no rubs, no murmurs ABD:  Flat, positive bowel sounds normal in frequency in pitch, no bruits, no rebound, no guarding, no midline pulsatile mass, no hepatomegaly, no splenomegaly EXT:  2 plus pulses upper, absent dorsalis pedis and posterior tibialis bilateral lower, no edema, no cyanosis no clubbing, erythema in the dorsum of the foot on the left with tenderness   EKG:  NA    ASSESSMENT AND PLAN   CAD:   He has had no new symptoms since stress testing in 2013 (large fixed scar).  No change in therapy.   HTN:    This is being managed in the context of treating his CHF  DYSLIPIDEMIA:    His LDL was 66 most recently and he will continue with current therapy.   CARDIOMYOPATHY:    I previously had to take him off of Entresto because of very significant hypotension.  Prior to that he been tolerating a low dose of Cozaar and so I am going to restart this at 25 mg once daily.  Would like to get a basic metabolic profile in about 7 days.   EDEMA/ERHYTHEMA:   We spoke with [facility] today as I am concerned about the left foot.  He is going to be seen in the office in the next 1 to 2 days.  I spoke with Dr. Carlis Abbott.    ICD:   He is up to date with follow up.

## 2018-06-14 ENCOUNTER — Ambulatory Visit: Payer: Medicare Other | Admitting: Cardiology

## 2018-06-14 ENCOUNTER — Encounter: Payer: Self-pay | Admitting: Cardiology

## 2018-06-14 VITALS — BP 124/60 | HR 69 | Ht 68.0 in | Wt 178.8 lb

## 2018-06-14 DIAGNOSIS — I10 Essential (primary) hypertension: Secondary | ICD-10-CM | POA: Diagnosis not present

## 2018-06-14 DIAGNOSIS — I5023 Acute on chronic systolic (congestive) heart failure: Secondary | ICD-10-CM

## 2018-06-14 DIAGNOSIS — Z79899 Other long term (current) drug therapy: Secondary | ICD-10-CM | POA: Insufficient documentation

## 2018-06-14 DIAGNOSIS — L539 Erythematous condition, unspecified: Secondary | ICD-10-CM | POA: Insufficient documentation

## 2018-06-14 MED ORDER — LOSARTAN POTASSIUM 25 MG PO TABS: 25.0000 mg | ORAL_TABLET | Freq: Every day | ORAL | 3 refills | Status: DC

## 2018-06-14 NOTE — Patient Instructions (Signed)
Medication Instructions:  START- Losartan 25 mg daily  If you need a refill on your cardiac medications before your next appointment, please call your pharmacy.  Labwork: BMP in 10 days HERE IN OUR OFFICE AT [facility]. You will NOT need to fast   Take the provided lab slips with you to the lab for your blood draw.  When you have your labs (blood work) drawn today and your tests are completely normal, you will receive your results only by MyChart Message (if you have MyChart) -OR-  A paper copy in the mail.  If you have any lab test that is abnormal or we need to change your treatment, we will call you to review these results.  Testing/Procedures: None Ordered  Follow-Up: You will need a follow up appointment in 12 months.  Please call our office 2 months in advance to schedule this appointment.  You may see [name], MD or one of the following Advanced Practice Providers on your designated Care Team:   Rosaria Ferries, PA-C . Jory Sims, DNP, ANP    At [facility], you and your health needs are our priority.  As part of our continuing mission to provide you with exceptional heart care, we have created designated Provider Care Teams.  These Care Teams include your primary Cardiologist (physician) and Advanced Practice Providers (APPs -  Physician Assistants and Nurse Practitioners) who all work together to provide you with the care you need, when you need it.  Thank you for choosing [facility] at [facility]!!

## 2018-06-15 ENCOUNTER — Ambulatory Visit (INDEPENDENT_AMBULATORY_CARE_PROVIDER_SITE_OTHER): Payer: Medicare Other | Admitting: Physician Assistant

## 2018-06-15 ENCOUNTER — Other Ambulatory Visit: Payer: Self-pay

## 2018-06-15 VITALS — BP 111/59 | HR 60 | Temp 97.1°F | Resp 16 | Ht 68.0 in | Wt 178.0 lb

## 2018-06-15 DIAGNOSIS — I739 Peripheral vascular disease, unspecified: Secondary | ICD-10-CM

## 2018-06-15 DIAGNOSIS — M79672 Pain in left foot: Secondary | ICD-10-CM | POA: Insufficient documentation

## 2018-06-15 NOTE — Progress Notes (Signed)
Postoperative Visit ([place])   History of Present Illness   [name] is a 85 y.o. male with PMH significant for CHF with EF about 25%, COPD, HTN, DM, and CKD who returns to clinic due to continued left dorsal foot pain.  He is s/p kissing iliac VBX stents by Dr. Carlis Abbott 05/04/18.  He was seen in routine follow up by his Cardiologist yesterday.  Due to concern for L foot ischemia/infection by Dr. Percival Spanish he was worked into clinic today.  Patient states this pain is similar to pain experienced pre-operatively.  Daughter is present on exam today and states redness of L dorsal foot is much improved compared to yesterday.  Patient was offered revascularization for known L SFA and popliteal stenosis however the patient and daughter do not feel this is necessary.  He denies any trauma to L foot or gout.  He has known BLE edema due to CHF and has some discoloration of feet at baseline.  He denies fevers, chills, N/V.  Also denies rest pain at night.  He is taking aspirin, plavix, and statin daily.  Current Outpatient Medications  Medication Sig Dispense Refill  . ACCU-CHEK AVIVA PLUS test strip     . aspirin 81 MG tablet Take 81 mg by mouth daily.    Marland Kitchen atorvastatin (LIPITOR) 40 MG tablet Take 40 mg by mouth Daily.     . clopidogrel (PLAVIX) 75 MG tablet Take 1 tablet (75 mg total) by mouth daily. 30 tablet 11  . cyanocobalamin 1000 MCG tablet Take 1,000 mcg by mouth daily.    Marland Kitchen FLOVENT HFA 110 MCG/ACT inhaler Inhale 2 puffs into the lungs 2 (two) times daily.  3  . fluticasone (FLONASE) 50 MCG/ACT nasal spray 2 sprays by Nasal route daily.      . furosemide (LASIX) 40 MG tablet Take 1 tablet (40 mg total) by mouth daily. (Patient taking differently: Take 80 mg by mouth daily. )    . levothyroxine (SYNTHROID, LEVOTHROID) 150 MCG tablet Take 150 mcg by mouth daily before breakfast.     . losartan (COZAAR) 25 MG tablet Take 1 tablet (25 mg total) by mouth daily. 90 tablet 3  . metoprolol succinate  (TOPROL-XL) 100 MG 24 hr tablet Take 1 tablet (100 mg total) by mouth daily.    . mirtazapine (REMERON) 15 MG tablet Take 15 mg by mouth at bedtime.   3  . montelukast (SINGULAIR) 10 MG tablet Take 10 mg by mouth at bedtime.   12  . omeprazole (PRILOSEC) 40 MG capsule Take 40 mg by mouth daily.    [name] Glycol-Propyl Glycol (SYSTANE OP) Place 1 drop into both eyes daily as needed (dry eyes).    Marland Kitchen QVAR 80 MCG/ACT inhaler Take 2 puffs by mouth 2 (two) times daily as needed (shortness of breath).      No current facility-administered medications for this visit.     ROS: 10 system ROS negative unless otherwise noted in HPI   For [facility]QI Use Only   PRE-ADM LIVING: Home  AMB STATUS: Ambulatory   Physical Examination   Vitals:   06/15/18 1550  BP: (!) 111/59  Pulse: 60  Resp: 16  Temp: (!) 97.1 F (36.2 C)  TempSrc: Oral  SpO2: 100%  Weight: 178 lb (80.7 kg)  Height: 5\' 8"  (1.727 m)   Body mass index is 27.06 kg/m.  General Alert, O x 3, WD, NAD  Pulmonary Sym exp, good B air movt,   Cardiac  RRR, Nl S1, S2,   Vascular Vessel Right Left  Radial Palpable Palpable  Brachial Palpable Palpable  Aorta Not palpable N/A  Femoral Palpable Palpable  Popliteal Not palpable Not palpable  PT Not palpable Not palpable  DP Not palpable Faintly palpable    Gastrointestinal soft, non-distended, non-tender to palpation,   Musculoskeletal M/S 5/5 throughout  , Extremities without ischemic changes  , Edema present: pitting edema to the level of the knee bilaterally with stasis pigmentation bilateral shins, no ulcerations; low intensity, minimal area of erythema L dorsal foot over 1st metatrsal painful to heavy touch  Neurologic  A&O    Medical Decision Making   [name] is a 85 y.o. male who presents to clinic to evaluate L foot pain redness at the request of patient's Cardiologist Dr. Percival Spanish   Patient is perfusing L foot well with faintly palpable L DP and brisk three  vessel runoff by doppler  Given physical exam, no concern for acute ischemia  Redness very unremarkable today in clinic; daughter said appearance of L foot much improved compared to yesterday; low suspicion for cellulitis/infection  I will defer L foot pain to PCP for further workup for conditions such as neuropathy, gout, stress injury, etc.  Patient and daughter are agreeable to this plan and will follow up in 3 months as scheduled for arterial duplex and ABIs  Call/return to clinic sooner if signs or symptoms of ischemia develop or if patient and daughter would like to proceed with SFA/popliteal intervention   Dagoberto Ligas PA-C [facility] Office: [phone]

## 2018-06-19 LAB — CUP PACEART REMOTE DEVICE CHECK
Battery Remaining Longevity: 84 mo
Battery Remaining Percentage: 86 %
Brady Statistic RV Percent Paced: 0 %
Date Time Interrogation Session: 20191217155300
HighPow Impedance: 58 Ohm
Implantable Lead Implant Date: 20020206
Implantable Lead Implant Date: 20071005
Implantable Lead Location: 753860
Implantable Lead Model: 148
Implantable Lead Model: 4088
Implantable Lead Serial Number: 109512
Implantable Lead Serial Number: 224392
Implantable Pulse Generator Implant Date: 20120330
Lead Channel Impedance Value: 459 Ohm
Lead Channel Pacing Threshold Amplitude: 2.5 V
Lead Channel Pacing Threshold Pulse Width: 1 ms
Lead Channel Setting Pacing Amplitude: 3.5 V
Lead Channel Setting Pacing Pulse Width: 1 ms
Lead Channel Setting Sensing Sensitivity: 0.5 mV
MDC IDC LEAD LOCATION: 753860
Pulse Gen Serial Number: 114258

## 2018-06-21 DIAGNOSIS — L03119 Cellulitis of unspecified part of limb: Secondary | ICD-10-CM | POA: Diagnosis not present

## 2018-06-21 DIAGNOSIS — H9222 Otorrhagia, left ear: Secondary | ICD-10-CM | POA: Diagnosis not present

## 2018-06-21 DIAGNOSIS — L03116 Cellulitis of left lower limb: Secondary | ICD-10-CM | POA: Diagnosis not present

## 2018-06-21 DIAGNOSIS — M7989 Other specified soft tissue disorders: Secondary | ICD-10-CM | POA: Diagnosis not present

## 2018-06-27 DIAGNOSIS — Z79899 Other long term (current) drug therapy: Secondary | ICD-10-CM | POA: Diagnosis not present

## 2018-06-28 DIAGNOSIS — Z9622 Myringotomy tube(s) status: Secondary | ICD-10-CM | POA: Diagnosis not present

## 2018-06-28 DIAGNOSIS — H7112 Cholesteatoma of tympanum, left ear: Secondary | ICD-10-CM | POA: Diagnosis not present

## 2018-06-28 LAB — BASIC METABOLIC PANEL
BUN/Creatinine Ratio: 17 (ref 10–24)
BUN: 22 mg/dL (ref 8–27)
CO2: 25 mmol/L (ref 20–29)
Calcium: 9 mg/dL (ref 8.6–10.2)
Chloride: 105 mmol/L (ref 96–106)
Creatinine, Ser: 1.26 mg/dL (ref 0.76–1.27)
GFR calc Af Amer: 61 mL/min/{1.73_m2} (ref >59)
GFR calc non Af Amer: 53 mL/min/{1.73_m2} — ABNORMAL LOW (ref >59)
Glucose: 102 mg/dL — ABNORMAL HIGH (ref 65–99)
Potassium: 4.8 mmol/L (ref 3.5–5.2)
Sodium: 142 mmol/L (ref 134–144)

## 2018-07-15 DIAGNOSIS — H7112 Cholesteatoma of tympanum, left ear: Secondary | ICD-10-CM | POA: Diagnosis not present

## 2018-07-15 DIAGNOSIS — Z9622 Myringotomy tube(s) status: Secondary | ICD-10-CM | POA: Diagnosis not present

## 2018-07-15 DIAGNOSIS — J342 Deviated nasal septum: Secondary | ICD-10-CM | POA: Diagnosis not present

## 2018-07-25 DIAGNOSIS — J189 Pneumonia, unspecified organism: Secondary | ICD-10-CM | POA: Diagnosis not present

## 2018-07-25 DIAGNOSIS — J449 Chronic obstructive pulmonary disease, unspecified: Secondary | ICD-10-CM | POA: Diagnosis not present

## 2018-07-25 DIAGNOSIS — B349 Viral infection, unspecified: Secondary | ICD-10-CM | POA: Diagnosis not present

## 2018-07-25 DIAGNOSIS — R0602 Shortness of breath: Secondary | ICD-10-CM | POA: Diagnosis not present

## 2018-07-25 DIAGNOSIS — J029 Acute pharyngitis, unspecified: Secondary | ICD-10-CM | POA: Diagnosis not present

## 2018-07-25 DIAGNOSIS — N183 Chronic kidney disease, stage 3 (moderate): Secondary | ICD-10-CM | POA: Diagnosis not present

## 2018-07-28 DIAGNOSIS — I739 Peripheral vascular disease, unspecified: Secondary | ICD-10-CM | POA: Diagnosis not present

## 2018-07-28 DIAGNOSIS — J189 Pneumonia, unspecified organism: Secondary | ICD-10-CM | POA: Diagnosis not present

## 2018-07-28 DIAGNOSIS — J449 Chronic obstructive pulmonary disease, unspecified: Secondary | ICD-10-CM | POA: Diagnosis not present

## 2018-07-28 DIAGNOSIS — N183 Chronic kidney disease, stage 3 (moderate): Secondary | ICD-10-CM | POA: Diagnosis not present

## 2018-07-28 DIAGNOSIS — N189 Chronic kidney disease, unspecified: Secondary | ICD-10-CM | POA: Diagnosis not present

## 2018-08-09 DIAGNOSIS — K210 Gastro-esophageal reflux disease with esophagitis: Secondary | ICD-10-CM | POA: Diagnosis not present

## 2018-08-09 DIAGNOSIS — D649 Anemia, unspecified: Secondary | ICD-10-CM | POA: Diagnosis not present

## 2018-08-09 DIAGNOSIS — E063 Autoimmune thyroiditis: Secondary | ICD-10-CM | POA: Diagnosis not present

## 2018-08-09 DIAGNOSIS — E785 Hyperlipidemia, unspecified: Secondary | ICD-10-CM | POA: Diagnosis not present

## 2018-08-09 DIAGNOSIS — E118 Type 2 diabetes mellitus with unspecified complications: Secondary | ICD-10-CM | POA: Diagnosis not present

## 2018-08-09 DIAGNOSIS — Z79899 Other long term (current) drug therapy: Secondary | ICD-10-CM | POA: Diagnosis not present

## 2018-08-09 DIAGNOSIS — J449 Chronic obstructive pulmonary disease, unspecified: Secondary | ICD-10-CM | POA: Diagnosis not present

## 2018-08-10 DIAGNOSIS — L821 Other seborrheic keratosis: Secondary | ICD-10-CM | POA: Diagnosis not present

## 2018-08-10 DIAGNOSIS — L578 Other skin changes due to chronic exposure to nonionizing radiation: Secondary | ICD-10-CM | POA: Diagnosis not present

## 2018-08-10 DIAGNOSIS — L570 Actinic keratosis: Secondary | ICD-10-CM | POA: Diagnosis not present

## 2018-08-16 ENCOUNTER — Ambulatory Visit (INDEPENDENT_AMBULATORY_CARE_PROVIDER_SITE_OTHER): Payer: Medicare Other | Admitting: *Deleted

## 2018-08-16 ENCOUNTER — Other Ambulatory Visit: Payer: Self-pay

## 2018-08-16 DIAGNOSIS — I255 Ischemic cardiomyopathy: Secondary | ICD-10-CM

## 2018-08-17 LAB — CUP PACEART REMOTE DEVICE CHECK
Battery Remaining Longevity: 78 mo
Battery Remaining Percentage: 83 %
Brady Statistic RV Percent Paced: 0 %
Date Time Interrogation Session: 20200317121600
HighPow Impedance: 64 Ohm
Implantable Lead Implant Date: 20020206
Implantable Lead Implant Date: 20071005
Implantable Lead Location: 753860
Implantable Lead Model: 148
Implantable Lead Model: 4088
Implantable Lead Serial Number: 109512
Implantable Lead Serial Number: 224392
Implantable Pulse Generator Implant Date: 20120330
Lead Channel Impedance Value: 482 Ohm
Lead Channel Pacing Threshold Amplitude: 2.5 V
Lead Channel Pacing Threshold Pulse Width: 1 ms
Lead Channel Setting Pacing Pulse Width: 1 ms
Lead Channel Setting Sensing Sensitivity: 0.5 mV
MDC IDC LEAD LOCATION: 753860
MDC IDC SET LEADCHNL RV PACING AMPLITUDE: 3.5 V
Pulse Gen Serial Number: 114258

## 2018-08-25 ENCOUNTER — Encounter: Payer: Self-pay | Admitting: Cardiology

## 2018-08-25 DIAGNOSIS — J449 Chronic obstructive pulmonary disease, unspecified: Secondary | ICD-10-CM | POA: Diagnosis not present

## 2018-08-25 DIAGNOSIS — J301 Allergic rhinitis due to pollen: Secondary | ICD-10-CM | POA: Diagnosis not present

## 2018-08-25 DIAGNOSIS — J020 Streptococcal pharyngitis: Secondary | ICD-10-CM | POA: Diagnosis not present

## 2018-08-25 DIAGNOSIS — J029 Acute pharyngitis, unspecified: Secondary | ICD-10-CM | POA: Diagnosis not present

## 2018-08-25 NOTE — Progress Notes (Signed)
Remote ICD transmission.   

## 2018-09-02 ENCOUNTER — Other Ambulatory Visit: Payer: Self-pay

## 2018-09-02 DIAGNOSIS — I739 Peripheral vascular disease, unspecified: Secondary | ICD-10-CM

## 2018-09-06 ENCOUNTER — Encounter (HOSPITAL_COMMUNITY): Payer: Medicare Other

## 2018-09-06 ENCOUNTER — Ambulatory Visit: Payer: Medicare Other | Admitting: Vascular Surgery

## 2018-11-07 DIAGNOSIS — J029 Acute pharyngitis, unspecified: Secondary | ICD-10-CM | POA: Diagnosis not present

## 2018-11-07 DIAGNOSIS — B349 Viral infection, unspecified: Secondary | ICD-10-CM | POA: Diagnosis not present

## 2018-11-07 DIAGNOSIS — J020 Streptococcal pharyngitis: Secondary | ICD-10-CM | POA: Diagnosis not present

## 2018-11-09 DIAGNOSIS — E118 Type 2 diabetes mellitus with unspecified complications: Secondary | ICD-10-CM | POA: Diagnosis not present

## 2018-11-09 DIAGNOSIS — E785 Hyperlipidemia, unspecified: Secondary | ICD-10-CM | POA: Diagnosis not present

## 2018-11-09 DIAGNOSIS — Z79899 Other long term (current) drug therapy: Secondary | ICD-10-CM | POA: Diagnosis not present

## 2018-11-09 DIAGNOSIS — N183 Chronic kidney disease, stage 3 (moderate): Secondary | ICD-10-CM | POA: Diagnosis not present

## 2018-11-09 DIAGNOSIS — J301 Allergic rhinitis due to pollen: Secondary | ICD-10-CM | POA: Diagnosis not present

## 2018-11-09 DIAGNOSIS — E063 Autoimmune thyroiditis: Secondary | ICD-10-CM | POA: Diagnosis not present

## 2018-11-10 DIAGNOSIS — H6592 Unspecified nonsuppurative otitis media, left ear: Secondary | ICD-10-CM | POA: Diagnosis not present

## 2018-11-10 DIAGNOSIS — H938X2 Other specified disorders of left ear: Secondary | ICD-10-CM | POA: Diagnosis not present

## 2018-11-10 DIAGNOSIS — T162XXA Foreign body in left ear, initial encounter: Secondary | ICD-10-CM | POA: Diagnosis not present

## 2018-11-10 DIAGNOSIS — H6982 Other specified disorders of Eustachian tube, left ear: Secondary | ICD-10-CM | POA: Diagnosis not present

## 2018-11-15 ENCOUNTER — Ambulatory Visit (INDEPENDENT_AMBULATORY_CARE_PROVIDER_SITE_OTHER): Payer: Medicare Other | Admitting: *Deleted

## 2018-11-15 DIAGNOSIS — I255 Ischemic cardiomyopathy: Secondary | ICD-10-CM | POA: Diagnosis not present

## 2018-11-15 LAB — CUP PACEART REMOTE DEVICE CHECK
Battery Remaining Longevity: 78 mo
Battery Remaining Percentage: 80 %
Brady Statistic RV Percent Paced: 0 %
Date Time Interrogation Session: 20200616113000
HighPow Impedance: 62 Ohm
Implantable Lead Implant Date: 20020206
Implantable Lead Implant Date: 20071005
Implantable Lead Location: 753860
Implantable Lead Location: 753860
Implantable Lead Model: 148
Implantable Lead Model: 4088
Implantable Lead Serial Number: 109512
Implantable Lead Serial Number: 224392
Implantable Pulse Generator Implant Date: 20120330
Lead Channel Impedance Value: 493 Ohm
Lead Channel Pacing Threshold Amplitude: 2.5 V
Lead Channel Pacing Threshold Pulse Width: 1 ms
Lead Channel Setting Pacing Amplitude: 3.5 V
Lead Channel Setting Pacing Pulse Width: 1 ms
Lead Channel Setting Sensing Sensitivity: 0.5 mV
Pulse Gen Serial Number: 114258

## 2018-11-22 DIAGNOSIS — J449 Chronic obstructive pulmonary disease, unspecified: Secondary | ICD-10-CM | POA: Diagnosis not present

## 2018-11-22 DIAGNOSIS — I2510 Atherosclerotic heart disease of native coronary artery without angina pectoris: Secondary | ICD-10-CM | POA: Diagnosis not present

## 2018-11-22 DIAGNOSIS — J189 Pneumonia, unspecified organism: Secondary | ICD-10-CM | POA: Diagnosis not present

## 2018-11-22 DIAGNOSIS — J029 Acute pharyngitis, unspecified: Secondary | ICD-10-CM | POA: Diagnosis not present

## 2018-11-22 DIAGNOSIS — K210 Gastro-esophageal reflux disease with esophagitis: Secondary | ICD-10-CM | POA: Diagnosis not present

## 2018-11-25 ENCOUNTER — Encounter: Payer: Self-pay | Admitting: Cardiology

## 2018-11-25 NOTE — Progress Notes (Signed)
Remote ICD transmission.   

## 2018-12-06 DIAGNOSIS — J029 Acute pharyngitis, unspecified: Secondary | ICD-10-CM | POA: Diagnosis not present

## 2018-12-06 DIAGNOSIS — J189 Pneumonia, unspecified organism: Secondary | ICD-10-CM | POA: Diagnosis not present

## 2018-12-06 DIAGNOSIS — E063 Autoimmune thyroiditis: Secondary | ICD-10-CM | POA: Diagnosis not present

## 2018-12-06 DIAGNOSIS — E785 Hyperlipidemia, unspecified: Secondary | ICD-10-CM | POA: Diagnosis not present

## 2018-12-06 DIAGNOSIS — J449 Chronic obstructive pulmonary disease, unspecified: Secondary | ICD-10-CM | POA: Diagnosis not present

## 2018-12-09 ENCOUNTER — Other Ambulatory Visit: Payer: Self-pay

## 2018-12-09 ENCOUNTER — Emergency Department (HOSPITAL_COMMUNITY)
Admission: EM | Admit: 2018-12-09 | Discharge: 2018-12-10 | Disposition: A | Discharge status: Home / Self Care | Payer: Medicare Other | Attending: Emergency Medicine | Admitting: Emergency Medicine

## 2018-12-09 ENCOUNTER — Emergency Department (HOSPITAL_COMMUNITY): Payer: Medicare Other

## 2018-12-09 ENCOUNTER — Encounter (HOSPITAL_COMMUNITY): Payer: Self-pay

## 2018-12-09 DIAGNOSIS — S81812A Laceration without foreign body, left lower leg, initial encounter: Secondary | ICD-10-CM | POA: Diagnosis not present

## 2018-12-09 DIAGNOSIS — Y999 Unspecified external cause status: Secondary | ICD-10-CM | POA: Diagnosis not present

## 2018-12-09 DIAGNOSIS — I2510 Atherosclerotic heart disease of native coronary artery without angina pectoris: Secondary | ICD-10-CM | POA: Diagnosis not present

## 2018-12-09 DIAGNOSIS — Y93E9 Activity, other interior property and clothing maintenance: Secondary | ICD-10-CM | POA: Diagnosis not present

## 2018-12-09 DIAGNOSIS — Z7902 Long term (current) use of antithrombotics/antiplatelets: Secondary | ICD-10-CM | POA: Insufficient documentation

## 2018-12-09 DIAGNOSIS — M25521 Pain in right elbow: Secondary | ICD-10-CM | POA: Diagnosis not present

## 2018-12-09 DIAGNOSIS — W19XXXA Unspecified fall, initial encounter: Secondary | ICD-10-CM | POA: Diagnosis not present

## 2018-12-09 DIAGNOSIS — Z7982 Long term (current) use of aspirin: Secondary | ICD-10-CM | POA: Diagnosis not present

## 2018-12-09 DIAGNOSIS — S51011A Laceration without foreign body of right elbow, initial encounter: Secondary | ICD-10-CM | POA: Diagnosis not present

## 2018-12-09 DIAGNOSIS — W010XXA Fall on same level from slipping, tripping and stumbling without subsequent striking against object, initial encounter: Secondary | ICD-10-CM | POA: Insufficient documentation

## 2018-12-09 DIAGNOSIS — J449 Chronic obstructive pulmonary disease, unspecified: Secondary | ICD-10-CM | POA: Insufficient documentation

## 2018-12-09 DIAGNOSIS — I959 Hypotension, unspecified: Secondary | ICD-10-CM | POA: Diagnosis not present

## 2018-12-09 DIAGNOSIS — Z87891 Personal history of nicotine dependence: Secondary | ICD-10-CM | POA: Insufficient documentation

## 2018-12-09 DIAGNOSIS — E039 Hypothyroidism, unspecified: Secondary | ICD-10-CM | POA: Diagnosis not present

## 2018-12-09 DIAGNOSIS — Y92003 Bedroom of unspecified non-institutional (private) residence as the place of occurrence of the external cause: Secondary | ICD-10-CM | POA: Insufficient documentation

## 2018-12-09 DIAGNOSIS — I5022 Chronic systolic (congestive) heart failure: Secondary | ICD-10-CM | POA: Diagnosis not present

## 2018-12-09 DIAGNOSIS — R52 Pain, unspecified: Secondary | ICD-10-CM | POA: Diagnosis not present

## 2018-12-09 DIAGNOSIS — I110 Hypertensive heart disease with heart failure: Secondary | ICD-10-CM | POA: Diagnosis not present

## 2018-12-09 MED ORDER — ACETAMINOPHEN 325 MG PO TABS
650.0000 mg | ORAL_TABLET | Freq: Once | ORAL | Status: AC
  Administered 2018-12-09: 650 mg via ORAL
  Filled 2018-12-09: qty 2

## 2018-12-09 NOTE — ED Triage Notes (Signed)
Per [facility]: Pt coming from home c/o laceration to the left lower leg after unwitnessed fall. Noted 3 inch lac to leg and skin tear to right elbow. No LOC and didn't hit head. Pt is on blood thinners. Bleeding controlled now.   96/56 (stated at baseline) 70 HR 98% room air 97.7 temp 16 RR 228 CBG   Pt is on steroids and antibiotics and dx with strep 2 weeks ago

## 2018-12-09 NOTE — ED Provider Notes (Addendum)
[facility] DEPT Provider Note   CSN: [id number] Arrival date & time: 12/09/18  2034    History   Chief Complaint Chief Complaint  Patient presents with  . Laceration    HPI BEECHER Shea is a 85 y.o. male with past medical history significant for a flutter, CAD, CHF presents emergency department today with chief complaint of fall.  This happened 2 hours prior to arrival.  Patient is on Plavix.  He states he was taking off his bedroom slipper when he lost his footing causing him to fall.  He landed on his right elbow and backside.  He has laceration to left lower leg and right elbow. He was able to get up without assistance. He denies hitting his head, no lo, visual changes, neck pain, back pain.He has pain localized to laceration on left leg that he describes as dull ache. Pt reports tetanus is up to date.    Past Medical History:  Diagnosis Date  . Atrial flutter ([place])    (I could not find documentation of this rhythm.)  . AUTOMATIC IMPLANTABLE CARDIAC DEFIBRILLATOR SITU   . Automatic implantable cardioverter-defibrillator in situ 12/05/2009   Qualifier: Diagnosis of  By: [name], MD, [name]   . CAD (coronary artery disease) 05/22/2011  . Carotid stenosis 05/22/2011  . CHF CONGESTIVE HEART FAILURE    45% by echo 2015  . COPD 12/01/2007   Qualifier: Diagnosis of  By: [name] NP, [name]    . DM2 (diabetes mellitus, type 2) ([place])   . DYSLIPIDEMIA   . DYSPNEA   . Edema   . Essential hypertension 11/17/2007   Qualifier: Diagnosis of  By: [name]    . HYPERTENSION   . HYPOTHYROIDISM   . MYOCARDIAL INFARCTION    MI 1999, stent x 2 to RCA, 70% circ, 30 and 40 % LADs  . WEIGHT GAIN, ABNORMAL     Patient Active Problem List   Diagnosis Date Noted  . Left foot pain 06/15/2018  . Medication management 06/14/2018  . Erythema 06/14/2018  . PAD (peripheral artery disease) ([place]) 04/05/2018  . Diet-controlled diabetes mellitus ([place])  02/01/2018  . Diminished pulses in lower extremity 02/01/2018  . Hypertrophic and atrophic condition of skin 02/01/2018  . S/P eye surgery 03/15/2017  . Hashimoto's thyroiditis 03/08/2017  . Ischemic cardiomyopathy 03/08/2017  . Coronary artery disease 03/08/2017  . Hypertension 03/08/2017  . Blepharitis of upper and lower eyelids of both eyes 12/16/2016  . Hypertropia of right eye 12/16/2016  . Hypotropia of left eye 09/08/2016  . Pseudophakia of both eyes 07/22/2016  . Cataract extraction status 06/03/2016  . Diplopia 04/06/2016  . 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 Ventricular tachycardia ([place]) 04/19/2014  . Carotid stenosis 05/22/2011  . CAD (coronary artery disease) 05/22/2011  . Automatic implantable cardioverter-defibrillator in situ 12/05/2009  . Presence of automatic (implantable) cardiac defibrillator 12/05/2009  . HYPOTHYROIDISM 04/29/2009  . DYSLIPIDEMIA 04/29/2009  . WEIGHT GAIN, ABNORMAL 01/23/2008  . COPD 12/01/2007  . EDEMA 12/01/2007  . Essential hypertension 11/17/2007  . MYOCARDIAL INFARCTION 11/17/2007  . Chronic systolic congestive heart failure ([place]) 11/17/2007  . DYSPNEA 11/17/2007    Past Surgical History:  Procedure Laterality Date  . ANGIOPLASTY     stent  . APPENDECTOMY  1957  . CARDIAC DEFIBRILLATOR PLACEMENT    . CAROTID STENT    . EYE SURGERY  1985  . LOWER EXTREMITY ANGIOGRAPHY N/A 05/04/2018   Procedure: LOWER EXTREMITY ANGIOGRAPHY;  Surgeon: Carlis Abbott,  Gwenyth Allegra, MD;  Location: [facility];  Service: Cardiovascular;  Laterality: N/A;  . PERIPHERAL VASCULAR INTERVENTION Bilateral 05/04/2018   Procedure: PERIPHERAL VASCULAR INTERVENTION;  Surgeon: [name], MD;  Location: [facility];  Service: Cardiovascular;  Laterality: Bilateral;  [place]        Home Medications    Prior to Admission medications   Medication Sig Start Date End Date Taking? Authorizing Provider  aspirin 81 MG tablet Take 81 mg by mouth daily.    Yes [provider]  atorvastatin (LIPITOR) 40 MG tablet Take 40 mg by mouth Daily.  08/25/10  Yes [provider]  clopidogrel (PLAVIX) 75 MG tablet Take 1 tablet (75 mg total) by mouth daily. 05/04/18 05/04/19 Yes [name], MD  cyanocobalamin 1000 MCG tablet Take 1,000 mcg by mouth daily.   Yes [provider]  FLOVENT HFA 110 MCG/ACT inhaler Inhale 2 puffs into the lungs 2 (two) times daily. 03/25/15  Yes [provider]  fluticasone (FLONASE) 50 MCG/ACT nasal spray 2 sprays by Nasal route daily.     Yes [provider]  furosemide (LASIX) 40 MG tablet Take 1 tablet (40 mg total) by mouth daily. Patient taking differently: Take 80 mg by mouth daily.  02/24/18  Yes [name], MD  levothyroxine (SYNTHROID, LEVOTHROID) 150 MCG tablet Take 150 mcg by mouth daily before breakfast.    Yes [provider]  losartan (COZAAR) 25 MG tablet Take 1 tablet (25 mg total) by mouth daily. 06/14/18 12/09/18 Yes [name], MD  metoprolol (TOPROL-XL) 200 MG 24 hr tablet Take 100 mg by mouth daily. 07/11/18  Yes [provider]  mirtazapine (REMERON) 15 MG tablet Take 15 mg by mouth at bedtime.  03/18/17  Yes [provider]  montelukast (SINGULAIR) 10 MG tablet Take 10 mg by mouth at bedtime.  02/05/17  Yes [provider]  omeprazole (PRILOSEC) 40 MG capsule Take 40 mg by mouth daily.   Yes [provider]  pantoprazole (PROTONIX) 40 MG tablet Take 40 mg by mouth daily. 08/04/18  Yes [provider]  Polyethyl Glycol-Propyl Glycol (SYSTANE OP) Place 1 drop into both eyes daily as needed (dry eyes).   Yes [provider]  ACCU-CHEK AVIVA PLUS test strip  07/12/10   [provider]    Family History Family History  Problem Relation Age of Onset  . Other Mother        natural causes  . Kidney disease Father   . Cancer Brother        throat cancer    Social History Social  History   Tobacco Use  . Smoking status: Former Smoker    Quit date: 12/10/1983    Years since quitting: 35.0  . Smokeless tobacco: Never Used  . Tobacco comment: quit 1985  Substance Use Topics  . Alcohol use: No  . Drug use: No     Allergies   Prednisone and Sulfa antibiotics   Review of Systems Review of Systems  Constitutional: Negative for chills and fever.  HENT: Negative for congestion, rhinorrhea, sinus pressure and sore throat.   Eyes: Negative for pain and redness.  Respiratory: Negative for cough, shortness of breath and wheezing.   Cardiovascular: Negative for chest pain and palpitations.  Gastrointestinal: Negative for abdominal pain, constipation, diarrhea, nausea and vomiting.  Genitourinary: Negative for dysuria.  Musculoskeletal: Positive for arthralgias. Negative for back pain, myalgias and neck pain.  Skin: Positive for wound.  Negative for rash.  Neurological: Negative for dizziness, syncope, weakness, numbness and headaches.  Psychiatric/Behavioral: Negative for confusion.     Physical Exam Updated Vital Signs BP (!) 106/47 (BP Location: Left Arm)   Pulse 61   Temp 98.8 F (37.1 C) (Oral)   Resp 18   Ht 6' (1.829 m)   Wt 83.9 kg   SpO2 98%   BMI 25.09 kg/m   Physical Exam Vitals signs and nursing note reviewed.  Constitutional:      General: He is not in acute distress.    Appearance: He is not ill-appearing.  HENT:     Head: Normocephalic. No raccoon eyes or Battle's sign.     Comments: No tenderness to palpation of skull. No deformities or crepitus noted. No open wounds, abrasions or lacerations.    Right Ear: Tympanic membrane and external ear normal.     Left Ear: Tympanic membrane and external ear normal.     Nose: Nose normal.     Mouth/Throat:     Mouth: Mucous membranes are moist.     Pharynx: Oropharynx is clear.  Eyes:     General: No scleral icterus.       Right eye: No discharge.        Left eye: No discharge.      Extraocular Movements: Extraocular movements intact.     Conjunctiva/sclera: Conjunctivae normal.     Pupils: Pupils are equal, round, and reactive to light.  Neck:     Musculoskeletal: Normal range of motion.     Vascular: No JVD.     Comments: Full ROM intact without spinous process TTP. No bony stepoffs or deformities, no paraspinous muscle TTP or muscle spasms. No rigidity or meningeal signs. No bruising, erythema, or swelling.  Cardiovascular:     Rate and Rhythm: Normal rate and regular rhythm.     Pulses: Normal pulses.          Radial pulses are 2+ on the right side and 2+ on the left side.       Dorsalis pedis pulses are 2+ on the right side and 2+ on the left side.     Heart sounds: Normal heart sounds.  Pulmonary:     Comments: Lungs clear to auscultation in all fields. Symmetric chest rise. No wheezing, rales, or rhonchi. Abdominal:     Comments: Abdomen is soft, non-distended, and non-tender in all quadrants. No rigidity, no guarding. No peritoneal signs.  Musculoskeletal: Normal range of motion.     Right hip: Normal.     Left hip: Normal.     Right knee: Normal.     Left knee: Normal.     Right ankle: Normal.     Left ankle: Normal.     Comments: Full range of motion of the thoracic spine and lumbar spine with flexion, hyperextension, and lateral flexion. No midline tenderness or stepoffs. No tenderness to palpation of the spinous processes of the thoracic spine or lumbar spine. No tenderness to palpation of the paraspinous muscles of lumbar spine.Pelvis is stable. No surrounding edema, erythema, wound.  Sensation grossly intact to light touch in the lower extremities bilaterally. No saddle anesthesias. Strength 5/5 with flexion and extension at the bilateral hips, knees, and ankles.  Left ankle with intact strength and resistance. Cap refill <2 seconds. Able to wiggle all toes.   Skin:    General: Skin is warm and dry.     Capillary Refill: Capillary refill takes  less than 2 seconds.  Neurological:     Mental Status: He is oriented to person, place, and time.     GCS: GCS eye subscore is 4. GCS verbal subscore is 5. GCS motor subscore is 6.     Comments: Mental Status:  Alert, oriented, thought content appropriate, able to give a coherent history. Speech fluent without evidence of aphasia. Able to follow 2 step commands without difficulty.  Cranial Nerves:  II:  Peripheral visual fields grossly normal, pupils equal, round, reactive to light III,IV, VI: ptosis not present, extra-ocular motions intact bilaterally  V,VII: smile symmetric, facial light touch sensation equal VIII: hearing grossly normal to voice  X: uvula elevates symmetrically  XI: bilateral shoulder shrug symmetric and strong XII: midline tongue extension without fassiculations Motor:  Normal tone. 5/5 in upper and lower extremities bilaterally including strong and equal grip strength and dorsiflexion/plantar flexion Sensory: Pinprick and light touch normal in all extremities.  Deep Tendon Reflexes: 2+ and symmetric in patella Cerebellar: normal finger-to-nose with bilateral upper extremities CV: distal pulses palpable throughout     Psychiatric:        Behavior: Behavior normal.      ED Treatments / Results  Labs (all labs ordered are listed, but only abnormal results are displayed) Labs Reviewed - No data to display  EKG None  Radiology Dg Elbow Complete Right  Result Date: 12/09/2018 CLINICAL DATA:  Pain status post fall EXAM: RIGHT ELBOW - COMPLETE 3+ VIEW COMPARISON:  None. FINDINGS: There is no evidence of fracture, dislocation, or joint effusion. There is no evidence of arthropathy or other focal bone abnormality. Soft tissues are unremarkable. IMPRESSION: No displaced fracture. Electronically Signed   By: Constance Holster M.D.   On: 12/09/2018 23:10   Dg Tibia/fibula Left  Result Date: 12/10/2018 CLINICAL DATA:  Initial evaluation for acute trauma,  fall. Laceration to lower leg. EXAM: LEFT TIBIA AND FIBULA - 2 VIEW COMPARISON:  None. FINDINGS: No acute fracture or dislocation. Limited views of the knee and ankle unremarkable. Mild soft tissue irregularity at the lateral aspect of the left lower leg likely reflects laceration. No radiopaque foreign body. Osteopenia noted. Scattered vascular calcifications noted within the leg. IMPRESSION: 1. No acute osseous abnormality about the left tibia/fibula. 2. Soft tissue laceration at the lower aspect of the left leg. No radiopaque foreign body. Electronically Signed   By: Jeannine Boga M.D.   On: 12/10/2018 00:29    Procedures .Marland KitchenLaceration Repair  Date/Time: 12/10/2018 4:21 AM Performed by: Cherre Robins, PA-C Authorized by: Cherre Robins, PA-C   Consent:    Consent obtained:  Verbal   Consent given by:  Patient   Risks discussed:  Infection, need for additional repair, pain, poor cosmetic result and poor wound healing   Alternatives discussed:  No treatment and delayed treatment Universal protocol:    Procedure explained and questions answered to patient or proxy's satisfaction: yes     Imaging studies available: yes     Immediately prior to procedure, a time out was called: yes     Patient identity confirmed:  Verbally with patient Anesthesia (see MAR for exact dosages):    Anesthesia method:  Local infiltration   Local anesthetic:  Lidocaine 2% WITH epi Laceration details:    Location:  Leg   Leg location:  L lower leg   Length (cm):  9   Depth (mm):  3 Repair type:    Repair type:  Simple Pre-procedure details:    Preparation:  Patient was prepped  and draped in usual sterile fashion and imaging obtained to evaluate for foreign bodies Exploration:    Hemostasis achieved with:  Epinephrine   Wound exploration: wound explored through full range of motion and entire depth of wound probed and visualized     Wound extent: no foreign bodies/material noted, no muscle  damage noted, no tendon damage noted and no underlying fracture noted     Contaminated: no   Treatment:    Area cleansed with:  Saline   Amount of cleaning:  Standard   Irrigation solution:  Sterile saline   Irrigation volume:  1000 ml   Irrigation method:  Pressure wash   Visualized foreign bodies/material removed: no   Skin repair:    Repair method:  Sutures   Suture size:  4-0   Suture material:  Prolene   Suture technique:  Simple interrupted   Number of sutures:  13 Approximation:    Approximation:  Close Post-procedure details:    Dressing:  Bulky dressing and non-adherent dressing   Patient tolerance of procedure:  Tolerated well, no immediate complications .Marland KitchenLaceration Repair  Date/Time: 12/10/2018 4:24 AM Performed by: Cherre Robins, PA-C Authorized by: Cherre Robins, PA-C   Consent:    Consent obtained:  Verbal   Consent given by:  Patient   Risks discussed:  Infection, need for additional repair, pain, poor cosmetic result and poor wound healing   Alternatives discussed:  No treatment and delayed treatment Universal protocol:    Procedure explained and questions answered to patient or proxy's satisfaction: yes     Imaging studies available: yes     Immediately prior to procedure, a time out was called: yes     Patient identity confirmed:  Verbally with patient Anesthesia (see MAR for exact dosages):    Anesthesia method:  None Laceration details:    Location:  Shoulder/arm   Shoulder/arm location:  R elbow   Length (cm):  6   Depth (mm):  1 Repair type:    Repair type:  Simple Exploration:    Hemostasis achieved with:  Direct pressure   Wound exploration: wound explored through full range of motion and entire depth of wound probed and visualized   Treatment:    Area cleansed with:  Saline   Amount of cleaning:  Standard   Irrigation solution:  Sterile saline   Irrigation volume:  500 ml   Irrigation method:  Pressure wash   Visualized  foreign bodies/material removed: no   Skin repair:    Repair method:  Steri-Strips   Number of Steri-Strips:  8 Approximation:    Approximation:  Close Post-procedure details:    Dressing:  Bulky dressing   Patient tolerance of procedure:  Tolerated well, no immediate complications   (including critical care time)  Medications Ordered in ED Medications  acetaminophen (TYLENOL) tablet 650 mg (650 mg Oral Given 12/09/18 2236)  lidocaine-EPINEPHrine (XYLOCAINE W/EPI) 2 %-1:200000 (PF) injection 20 mL (20 mLs Infiltration Given by Other 12/10/18 0122)     Initial Impression / Assessment and Plan / ED Course  I have reviewed the triage vital signs and the nursing notes.  Pertinent labs & imaging results that were available during my care of the patient were reviewed by me and considered in my medical decision making (see chart for details).  Patient presents to the emergency department with laceration to right elbow and left lower extremity which occurred within 8 hours PTA . Patient nontoxic appearing, resting comfortably. Given reassuring physical exam and  neuro exam without focal deficit will hold off on head CT at this time. Will continue to observe pt and monitor for mental status changes, will  CT head if neuro changes.  X-rays of right elbow and left tib/fib without fractures/dislocations or apparent radiopaque foreign bodies. Pressure irrigation performed. Wound explored and base of wound visualized in a bloodless field without evidence of foreign body. Laceration repair per procedure note above, tolerated well. Tetanus is up to date. Do not feel that abx are indicated at this time based on wound appearance and lack of significant comorbidities. Discussed suture home care as well as need for wound recheck and suture removal in 10-14 days.  I discussed results, treatment plan, need for follow-up, and return precautions with the patient including signs of infection. Provided opportunity for  questions, patient confirmed understanding and is in agreement with plan. The patient was discussed with and seen by Dr. Sedonia Small who agrees with the treatment plan.  This note was prepared using Dragon voice recognition software and may include unintentional dictation errors due to the inherent limitations of voice recognition software.   Final Clinical Impressions(s) / ED Diagnoses   Final diagnoses:  Fall, initial encounter  Laceration of left lower extremity, initial encounter    ED Discharge Orders    None       Cherre Robins, PA-C 12/10/18 0432    Cherre Robins, PA-C 12/10/18 0440    Maudie Flakes, MD 12/12/18 [phone]

## 2018-12-10 ENCOUNTER — Emergency Department (HOSPITAL_COMMUNITY): Payer: Medicare Other

## 2018-12-10 DIAGNOSIS — S81812A Laceration without foreign body, left lower leg, initial encounter: Secondary | ICD-10-CM | POA: Diagnosis not present

## 2018-12-10 MED ORDER — LIDOCAINE-EPINEPHRINE (PF) 2 %-1:200000 IJ SOLN
20.0000 mL | Freq: Once | INTRAMUSCULAR | Status: AC
  Administered 2018-12-10: 20 mL
  Filled 2018-12-10: qty 20

## 2018-12-10 NOTE — Discharge Instructions (Addendum)
Please follow up with primary care physician in 2-5 days for wound check  1. Medications: Tylenol for pain, usual home medications  2. Treatment: ice for swelling, keep wound clean with warm soap and water and keep bandage dry, do not submerge in water for 24 hours  3. Follow Up: Please return in 10-14 days to have your stitches/staples removed or sooner if you have concerns. Return to the emergency department for increased redness, drainage of pus from the wound. You can have sutures removed by primary care physician also   WOUND CARE  Keep area clean and dry for 24 hours. Do not remove bandage, if applied.  After 24 hours, remove bandage and wash wound gently with mild soap and warm water. Reapply a new bandage after cleaning wound, if directed.   Continue daily cleansing with soap and water until stitches removed.  Do not apply any ointments or creams to the wound while stitches are in place, as this may cause delayed healing. Return if you experience any of the following signs of infection: Swelling, redness, pus drainage, streaking, fever >101.0 F  Return if you experience excessive bleeding that does not stop after 15-20 minutes of constant, firm pressure.

## 2018-12-10 NOTE — ED Notes (Signed)
Pt verbalized discharge instructions and follow up care. Alert and ambulatory. No iv. Family is picking pt up. Wound dressed

## 2018-12-10 NOTE — ED Notes (Signed)
Suture materials at bedside for provider. Wound irrigated and cleaned

## 2018-12-13 DIAGNOSIS — S41119A Laceration without foreign body of unspecified upper arm, initial encounter: Secondary | ICD-10-CM | POA: Diagnosis not present

## 2018-12-13 DIAGNOSIS — S81812S Laceration without foreign body, left lower leg, sequela: Secondary | ICD-10-CM | POA: Diagnosis not present

## 2018-12-13 DIAGNOSIS — Z23 Encounter for immunization: Secondary | ICD-10-CM | POA: Diagnosis not present

## 2018-12-26 DIAGNOSIS — L97329 Non-pressure chronic ulcer of left ankle with unspecified severity: Secondary | ICD-10-CM | POA: Diagnosis not present

## 2018-12-26 DIAGNOSIS — S81812S Laceration without foreign body, left lower leg, sequela: Secondary | ICD-10-CM | POA: Diagnosis not present

## 2018-12-26 DIAGNOSIS — I83023 Varicose veins of left lower extremity with ulcer of ankle: Secondary | ICD-10-CM | POA: Diagnosis not present

## 2018-12-29 DIAGNOSIS — S81812S Laceration without foreign body, left lower leg, sequela: Secondary | ICD-10-CM | POA: Diagnosis not present

## 2018-12-29 DIAGNOSIS — L97929 Non-pressure chronic ulcer of unspecified part of left lower leg with unspecified severity: Secondary | ICD-10-CM | POA: Diagnosis not present

## 2018-12-29 DIAGNOSIS — I83029 Varicose veins of left lower extremity with ulcer of unspecified site: Secondary | ICD-10-CM | POA: Diagnosis not present

## 2019-01-05 DIAGNOSIS — J449 Chronic obstructive pulmonary disease, unspecified: Secondary | ICD-10-CM | POA: Diagnosis not present

## 2019-01-05 DIAGNOSIS — J020 Streptococcal pharyngitis: Secondary | ICD-10-CM | POA: Diagnosis not present

## 2019-01-05 DIAGNOSIS — I83029 Varicose veins of left lower extremity with ulcer of unspecified site: Secondary | ICD-10-CM | POA: Diagnosis not present

## 2019-01-05 DIAGNOSIS — L97929 Non-pressure chronic ulcer of unspecified part of left lower leg with unspecified severity: Secondary | ICD-10-CM | POA: Diagnosis not present

## 2019-01-05 DIAGNOSIS — S81812S Laceration without foreign body, left lower leg, sequela: Secondary | ICD-10-CM | POA: Diagnosis not present

## 2019-01-05 DIAGNOSIS — J029 Acute pharyngitis, unspecified: Secondary | ICD-10-CM | POA: Diagnosis not present

## 2019-01-12 DIAGNOSIS — L97929 Non-pressure chronic ulcer of unspecified part of left lower leg with unspecified severity: Secondary | ICD-10-CM | POA: Diagnosis not present

## 2019-01-12 DIAGNOSIS — I83029 Varicose veins of left lower extremity with ulcer of unspecified site: Secondary | ICD-10-CM | POA: Diagnosis not present

## 2019-01-12 DIAGNOSIS — S81812S Laceration without foreign body, left lower leg, sequela: Secondary | ICD-10-CM | POA: Diagnosis not present

## 2019-01-18 DIAGNOSIS — S81812S Laceration without foreign body, left lower leg, sequela: Secondary | ICD-10-CM | POA: Diagnosis not present

## 2019-01-18 DIAGNOSIS — I83029 Varicose veins of left lower extremity with ulcer of unspecified site: Secondary | ICD-10-CM | POA: Diagnosis not present

## 2019-01-18 DIAGNOSIS — L97929 Non-pressure chronic ulcer of unspecified part of left lower leg with unspecified severity: Secondary | ICD-10-CM | POA: Diagnosis not present

## 2019-01-24 DIAGNOSIS — I83029 Varicose veins of left lower extremity with ulcer of unspecified site: Secondary | ICD-10-CM | POA: Diagnosis not present

## 2019-01-24 DIAGNOSIS — S81812S Laceration without foreign body, left lower leg, sequela: Secondary | ICD-10-CM | POA: Diagnosis not present

## 2019-01-24 DIAGNOSIS — L97929 Non-pressure chronic ulcer of unspecified part of left lower leg with unspecified severity: Secondary | ICD-10-CM | POA: Diagnosis not present

## 2019-01-26 ENCOUNTER — Encounter: Payer: Medicare Other | Admitting: Internal Medicine

## 2019-02-09 DIAGNOSIS — E118 Type 2 diabetes mellitus with unspecified complications: Secondary | ICD-10-CM | POA: Diagnosis not present

## 2019-02-09 DIAGNOSIS — J449 Chronic obstructive pulmonary disease, unspecified: Secondary | ICD-10-CM | POA: Diagnosis not present

## 2019-02-09 DIAGNOSIS — Z79899 Other long term (current) drug therapy: Secondary | ICD-10-CM | POA: Diagnosis not present

## 2019-02-09 DIAGNOSIS — E785 Hyperlipidemia, unspecified: Secondary | ICD-10-CM | POA: Diagnosis not present

## 2019-02-09 DIAGNOSIS — E079 Disorder of thyroid, unspecified: Secondary | ICD-10-CM | POA: Diagnosis not present

## 2019-02-09 DIAGNOSIS — K210 Gastro-esophageal reflux disease with esophagitis: Secondary | ICD-10-CM | POA: Diagnosis not present

## 2019-02-09 DIAGNOSIS — D649 Anemia, unspecified: Secondary | ICD-10-CM | POA: Diagnosis not present

## 2019-02-14 ENCOUNTER — Ambulatory Visit (INDEPENDENT_AMBULATORY_CARE_PROVIDER_SITE_OTHER): Payer: Medicare Other | Admitting: *Deleted

## 2019-02-14 DIAGNOSIS — I472 Ventricular tachycardia, unspecified: Secondary | ICD-10-CM

## 2019-02-14 DIAGNOSIS — I255 Ischemic cardiomyopathy: Secondary | ICD-10-CM | POA: Diagnosis not present

## 2019-02-14 LAB — CUP PACEART REMOTE DEVICE CHECK
Battery Remaining Longevity: 72 mo
Battery Remaining Percentage: 76 %
Brady Statistic RV Percent Paced: 0 %
Date Time Interrogation Session: 20200915132000
HighPow Impedance: 63 Ohm
Implantable Lead Implant Date: 20020206
Implantable Lead Implant Date: 20071005
Implantable Lead Location: 753860
Implantable Lead Location: 753860
Implantable Lead Model: 148
Implantable Lead Model: 4088
Implantable Lead Serial Number: 109512
Implantable Lead Serial Number: 224392
Implantable Pulse Generator Implant Date: 20120330
Lead Channel Impedance Value: 476 Ohm
Lead Channel Pacing Threshold Amplitude: 2.5 V
Lead Channel Pacing Threshold Pulse Width: 1 ms
Lead Channel Setting Pacing Amplitude: 3.5 V
Lead Channel Setting Pacing Pulse Width: 1 ms
Lead Channel Setting Sensing Sensitivity: 0.5 mV
Pulse Gen Serial Number: 114258

## 2019-02-20 DIAGNOSIS — H6982 Other specified disorders of Eustachian tube, left ear: Secondary | ICD-10-CM | POA: Diagnosis not present

## 2019-02-20 DIAGNOSIS — H938X2 Other specified disorders of left ear: Secondary | ICD-10-CM | POA: Diagnosis not present

## 2019-02-20 DIAGNOSIS — H6592 Unspecified nonsuppurative otitis media, left ear: Secondary | ICD-10-CM | POA: Diagnosis not present

## 2019-02-21 DIAGNOSIS — D485 Neoplasm of uncertain behavior of skin: Secondary | ICD-10-CM | POA: Diagnosis not present

## 2019-02-21 DIAGNOSIS — L821 Other seborrheic keratosis: Secondary | ICD-10-CM | POA: Diagnosis not present

## 2019-02-21 DIAGNOSIS — L570 Actinic keratosis: Secondary | ICD-10-CM | POA: Diagnosis not present

## 2019-02-21 DIAGNOSIS — L578 Other skin changes due to chronic exposure to nonionizing radiation: Secondary | ICD-10-CM | POA: Diagnosis not present

## 2019-02-21 NOTE — Progress Notes (Signed)
Remote ICD transmission.   

## 2019-03-09 DIAGNOSIS — I420 Dilated cardiomyopathy: Secondary | ICD-10-CM | POA: Diagnosis not present

## 2019-03-09 DIAGNOSIS — N183 Chronic kidney disease, stage 3 unspecified: Secondary | ICD-10-CM | POA: Diagnosis not present

## 2019-03-09 DIAGNOSIS — Z23 Encounter for immunization: Secondary | ICD-10-CM | POA: Diagnosis not present

## 2019-03-09 DIAGNOSIS — M25511 Pain in right shoulder: Secondary | ICD-10-CM | POA: Diagnosis not present

## 2019-03-09 DIAGNOSIS — G4700 Insomnia, unspecified: Secondary | ICD-10-CM | POA: Diagnosis not present

## 2019-03-09 DIAGNOSIS — L98499 Non-pressure chronic ulcer of skin of other sites with unspecified severity: Secondary | ICD-10-CM | POA: Diagnosis not present

## 2019-03-13 ENCOUNTER — Encounter: Payer: Self-pay | Admitting: Gastroenterology

## 2019-04-03 ENCOUNTER — Ambulatory Visit: Payer: Medicare Other | Admitting: Gastroenterology

## 2019-04-10 DIAGNOSIS — K2100 Gastro-esophageal reflux disease with esophagitis, without bleeding: Secondary | ICD-10-CM | POA: Diagnosis not present

## 2019-04-10 DIAGNOSIS — E785 Hyperlipidemia, unspecified: Secondary | ICD-10-CM | POA: Diagnosis not present

## 2019-04-10 DIAGNOSIS — M25511 Pain in right shoulder: Secondary | ICD-10-CM | POA: Diagnosis not present

## 2019-04-10 DIAGNOSIS — S8011XA Contusion of right lower leg, initial encounter: Secondary | ICD-10-CM | POA: Diagnosis not present

## 2019-04-10 DIAGNOSIS — I420 Dilated cardiomyopathy: Secondary | ICD-10-CM | POA: Diagnosis not present

## 2019-04-19 ENCOUNTER — Other Ambulatory Visit: Payer: Self-pay

## 2019-04-19 ENCOUNTER — Ambulatory Visit (INDEPENDENT_AMBULATORY_CARE_PROVIDER_SITE_OTHER): Payer: Medicare Other | Admitting: Internal Medicine

## 2019-04-19 ENCOUNTER — Encounter: Payer: Self-pay | Admitting: Internal Medicine

## 2019-04-19 VITALS — BP 124/70 | HR 59 | Ht 69.0 in | Wt 175.4 lb

## 2019-04-19 DIAGNOSIS — I5022 Chronic systolic (congestive) heart failure: Secondary | ICD-10-CM

## 2019-04-19 DIAGNOSIS — I10 Essential (primary) hypertension: Secondary | ICD-10-CM

## 2019-04-19 DIAGNOSIS — Z95810 Presence of automatic (implantable) cardiac defibrillator: Secondary | ICD-10-CM | POA: Diagnosis not present

## 2019-04-19 DIAGNOSIS — I2510 Atherosclerotic heart disease of native coronary artery without angina pectoris: Secondary | ICD-10-CM

## 2019-04-19 DIAGNOSIS — I255 Ischemic cardiomyopathy: Secondary | ICD-10-CM | POA: Diagnosis not present

## 2019-04-19 NOTE — Progress Notes (Signed)
HPI [name] returns today for ongoing evaluation and management of chronic systolic heart failure, and ischemic cardiomyopathy, status post ICD implantation. He has a long history of obesity but has lost weight in the past. He has a h/o VT and is s/p ICD insertion in 2002. He has done well in the interim. He remains active. No chest pain, sob, or syncope. He denies chest pain and has class 2 CHF symptoms. No hospitalization since I saw him last 2 years ago. Allergies  Allergen Reactions  . Prednisone Rash  . Sulfa Antibiotics Other (See Comments)    Hyper      Current Outpatient Medications  Medication Sig Dispense Refill  . ACCU-CHEK AVIVA PLUS test strip     . aspirin 81 MG tablet Take 81 mg by mouth daily.    Marland Kitchen atorvastatin (LIPITOR) 40 MG tablet Take 40 mg by mouth Daily.     . clopidogrel (PLAVIX) 75 MG tablet Take 1 tablet (75 mg total) by mouth daily. 30 tablet 11  . cyanocobalamin 1000 MCG tablet Take 1,000 mcg by mouth daily.    Marland Kitchen FLOVENT HFA 110 MCG/ACT inhaler Inhale 2 puffs into the lungs 2 (two) times daily.  3  . fluticasone (FLONASE) 50 MCG/ACT nasal spray 2 sprays by Nasal route daily.      . furosemide (LASIX) 40 MG tablet Take 1 tablet (40 mg total) by mouth daily. (Patient taking differently: Take 80 mg by mouth daily. )    . levothyroxine (SYNTHROID, LEVOTHROID) 150 MCG tablet Take 150 mcg by mouth daily before breakfast.     . losartan (COZAAR) 25 MG tablet Take 1 tablet (25 mg total) by mouth daily. 90 tablet 3  . metoprolol (TOPROL-XL) 200 MG 24 hr tablet Take 100 mg by mouth daily.    . mirtazapine (REMERON) 15 MG tablet Take 15 mg by mouth at bedtime.   3  . montelukast (SINGULAIR) 10 MG tablet Take 10 mg by mouth at bedtime.   12  . pantoprazole (PROTONIX) 40 MG tablet Take 40 mg by mouth daily.    [name] Glycol-Propyl Glycol (SYSTANE OP) Place 1 drop into both eyes daily as needed (dry eyes).     No current facility-administered medications  for this visit.      Past Medical History:  Diagnosis Date  . Atrial flutter ([place])    (I could not find documentation of this rhythm.)  . AUTOMATIC IMPLANTABLE CARDIAC DEFIBRILLATOR SITU   . Automatic implantable cardioverter-defibrillator in situ 12/05/2009   Qualifier: Diagnosis of  By: [name], MD, [name]   . CAD (coronary artery disease) 05/22/2011  . Carotid stenosis 05/22/2011  . CHF CONGESTIVE HEART FAILURE    45% by echo 2015  . COPD 12/01/2007   Qualifier: Diagnosis of  By: [name] NP, [name]    . DM2 (diabetes mellitus, type 2) ([place])   . DYSLIPIDEMIA   . DYSPNEA   . Edema   . Essential hypertension 11/17/2007   Qualifier: Diagnosis of  By: [name]    . Gout   . HYPERTENSION   . HYPOTHYROIDISM   . MYOCARDIAL INFARCTION    MI 1999, stent x 2 to RCA, 70% circ, 30 and 40 % LADs  . WEIGHT GAIN, ABNORMAL     ROS:   All systems reviewed and negative except as noted in the HPI.   Past Surgical History:  Procedure Laterality Date  . ANGIOPLASTY     stent  .  APPENDECTOMY  1957  . CARDIAC DEFIBRILLATOR PLACEMENT    . CAROTID STENT    . EYE SURGERY  1985  . LOWER EXTREMITY ANGIOGRAPHY N/A 05/04/2018   Procedure: LOWER EXTREMITY ANGIOGRAPHY;  Surgeon: [name], MD;  Location: [place] [facility];  Service: Cardiovascular;  Laterality: N/A;  . PERIPHERAL VASCULAR INTERVENTION Bilateral 05/04/2018   Procedure: PERIPHERAL VASCULAR INTERVENTION;  Surgeon: [name], MD;  Location: [place] [facility];  Service: Cardiovascular;  Laterality: Bilateral;  [place]     Family History  Problem Relation Age of Onset  . Other Mother        natural causes  . Kidney disease Father   . Cancer Brother        throat cancer     Social History   Socioeconomic History  . Marital status: Married    Spouse name: Not on file  . Number of children: Not on file  . Years of education: Not on file  . Highest education level: Not on file   Occupational History  . Not on file  Social Needs  . Financial resource strain: Not on file  . Food insecurity    Worry: Not on file    Inability: Not on file  . Transportation needs    Medical: Not on file    Non-medical: Not on file  Tobacco Use  . Smoking status: Former Smoker    Quit date: 12/10/1983    Years since quitting: 35.3  . Smokeless tobacco: Never Used  . Tobacco comment: quit 1985  Substance and Sexual Activity  . Alcohol use: No  . Drug use: No  . Sexual activity: Not on file  Lifestyle  . Physical activity    Days per week: Not on file    Minutes per session: Not on file  . Stress: Not on file  Relationships  . Social Herbalist on phone: Not on file    Gets together: Not on file    Attends religious service: Not on file    Active member of club or organization: Not on file    Attends meetings of clubs or organizations: Not on file    Relationship status: Not on file  . Intimate partner violence    Fear of current or ex partner: Not on file    Emotionally abused: Not on file    Physically abused: Not on file    Forced sexual activity: Not on file  Other Topics Concern  . Not on file  Social History Narrative  . Not on file     BP 124/70   Pulse (!) 59   Ht 5\' 9"  (1.753 m)   Wt 175 lb 6.4 oz (79.6 kg)   SpO2 99%   BMI 25.90 kg/m   Physical Exam:  Well appearing NAD HEENT: Unremarkable Neck:  No JVD, no thyromegally Lymphatics:  No adenopathy Back:  No CVA tenderness Lungs:  Clear HEART:  Regular rate rhythm, no murmurs, no rubs, no clicks Abd:  soft, positive bowel sounds, no organomegally, no rebound, no guarding Ext:  2 plus pulses, no edema, no cyanosis, no clubbing Skin:  No rashes no nodules Neuro:  CN II through XII intact, motor grossly intact  EKG - NSR with LBBB  DEVICE  Normal device function.  See PaceArt for details.   Assess/Plan: 1. ICM - he denies anginal symptoms. He will continue his current meds. He  is encouraged to increase his physical activity. 2.  Chronic systolic heart failure - his symptoms remain class 2. He will maintain a low sodium diet. 3. ICD - his [employer] single chamber ICD is working normally. We will recheck in several months.  Mikle Bosworth.D.

## 2019-04-19 NOTE — Patient Instructions (Signed)
Medication Instructions:  Your physician recommends that you continue on your current medications as directed. Please refer to the Current Medication list given to you today.  Labwork: None ordered.  Testing/Procedures:  None ordered.  Follow-Up: Your physician wants you to follow-up in: one year with Dr. [name].  You will receive a reminder letter in the mail two months in advance. If you don't receive a letter, please call our office to schedule the follow-up appointment.  Remote monitoring is used to monitor your ICD from home. This monitoring reduces the number of office visits required to check your device to one time per year. It allows [place] to keep an eye on the functioning of your device to ensure it is working properly. You are scheduled for a device check from home on 05/16/2019. You may send your transmission at any time that day. If you have a wireless device, the transmission will be sent automatically. After your physician reviews your transmission, you will receive a postcard with your next transmission date.  Any Other Special Instructions Will Be Listed Below (If Applicable).  If you need a refill on your cardiac medications before your next appointment, please call your pharmacy.

## 2019-05-02 DIAGNOSIS — H6522 Chronic serous otitis media, left ear: Secondary | ICD-10-CM | POA: Diagnosis not present

## 2019-05-07 ENCOUNTER — Other Ambulatory Visit: Payer: Self-pay | Admitting: Vascular Surgery

## 2019-05-16 ENCOUNTER — Ambulatory Visit (INDEPENDENT_AMBULATORY_CARE_PROVIDER_SITE_OTHER): Payer: Medicare Other | Admitting: *Deleted

## 2019-05-16 DIAGNOSIS — H6982 Other specified disorders of Eustachian tube, left ear: Secondary | ICD-10-CM | POA: Diagnosis not present

## 2019-05-16 DIAGNOSIS — N183 Chronic kidney disease, stage 3 unspecified: Secondary | ICD-10-CM | POA: Diagnosis not present

## 2019-05-16 DIAGNOSIS — Z9181 History of falling: Secondary | ICD-10-CM | POA: Diagnosis not present

## 2019-05-16 DIAGNOSIS — Z95810 Presence of automatic (implantable) cardiac defibrillator: Secondary | ICD-10-CM | POA: Diagnosis not present

## 2019-05-16 DIAGNOSIS — E063 Autoimmune thyroiditis: Secondary | ICD-10-CM | POA: Diagnosis not present

## 2019-05-16 DIAGNOSIS — Z9861 Coronary angioplasty status: Secondary | ICD-10-CM | POA: Diagnosis not present

## 2019-05-16 DIAGNOSIS — E785 Hyperlipidemia, unspecified: Secondary | ICD-10-CM | POA: Diagnosis not present

## 2019-05-16 DIAGNOSIS — E118 Type 2 diabetes mellitus with unspecified complications: Secondary | ICD-10-CM | POA: Diagnosis not present

## 2019-05-16 LAB — CUP PACEART REMOTE DEVICE CHECK
Battery Remaining Longevity: 66 mo
Battery Remaining Percentage: 73 %
Brady Statistic RV Percent Paced: 0 %
Date Time Interrogation Session: 20201215065700
HighPow Impedance: 61 Ohm
Implantable Lead Implant Date: 20020206
Implantable Lead Implant Date: 20071005
Implantable Lead Location: 753860
Implantable Lead Location: 753860
Implantable Lead Model: 148
Implantable Lead Model: 4088
Implantable Lead Serial Number: 109512
Implantable Lead Serial Number: 224392
Implantable Pulse Generator Implant Date: 20120330
Lead Channel Impedance Value: 468 Ohm
Lead Channel Pacing Threshold Amplitude: 1.9 V
Lead Channel Pacing Threshold Pulse Width: 1 ms
Lead Channel Setting Pacing Amplitude: 3.5 V
Lead Channel Setting Pacing Pulse Width: 1 ms
Lead Channel Setting Sensing Sensitivity: 0.5 mV
Pulse Gen Serial Number: 114258

## 2019-05-22 ENCOUNTER — Other Ambulatory Visit: Payer: Self-pay | Admitting: Cardiology

## 2019-06-05 ENCOUNTER — Encounter: Payer: Self-pay | Admitting: Family Medicine

## 2019-06-06 DIAGNOSIS — L820 Inflamed seborrheic keratosis: Secondary | ICD-10-CM | POA: Diagnosis not present

## 2019-06-06 DIAGNOSIS — L578 Other skin changes due to chronic exposure to nonionizing radiation: Secondary | ICD-10-CM | POA: Diagnosis not present

## 2019-06-06 DIAGNOSIS — L570 Actinic keratosis: Secondary | ICD-10-CM | POA: Diagnosis not present

## 2019-06-14 DIAGNOSIS — M7989 Other specified soft tissue disorders: Secondary | ICD-10-CM | POA: Insufficient documentation

## 2019-06-14 DIAGNOSIS — Z7189 Other specified counseling: Secondary | ICD-10-CM | POA: Insufficient documentation

## 2019-06-14 NOTE — Progress Notes (Signed)
Cardiology Office Note   Date:  06/15/2019   ID:  [name], DOB [birth year], MRN [id number]  PCP:  [name]., MD  Cardiologist:   [name], MD   No chief complaint on file.     History of Present Illness: [name] is a 86 y.o. male who presents for followup of his coronary disease and cardiomyopathy.   At the last visit his EF was lower than previous at 25% and I started [place].  However, he developed significant hypotension and has not tolerated this and has been off that med.  A repeat echo was unchanged at 25%.    Since I last saw him he has done well.  The patient denies any new symptoms such as chest discomfort, neck or arm discomfort. There has been no new shortness of breath, PND or orthopnea. There have been no reported palpitations, presyncope or syncope.  He stays busy at home with his daughter is giving him chores.  Past Medical History:  Diagnosis Date  . Atrial flutter ([place])    (I could not find documentation of this rhythm.)  . AUTOMATIC IMPLANTABLE CARDIAC DEFIBRILLATOR SITU   . Automatic implantable cardioverter-defibrillator in situ 12/05/2009   Qualifier: Diagnosis of  By: [name], MD, [name]   . CAD (coronary artery disease) 05/22/2011  . Carotid stenosis 05/22/2011  . CHF CONGESTIVE HEART FAILURE    45% by echo 2015  . COPD 12/01/2007   Qualifier: Diagnosis of  By: [name] NP, [name]    . DM2 (diabetes mellitus, type 2) ([place])   . DYSLIPIDEMIA   . DYSPNEA   . Edema   . Essential hypertension 11/17/2007   Qualifier: Diagnosis of  By: [name]    . Gout   . HYPERTENSION   . HYPOTHYROIDISM   . MYOCARDIAL INFARCTION    MI 1999, stent x 2 to RCA, 70% circ, 30 and 40 % LADs  . WEIGHT GAIN, ABNORMAL     Past Surgical History:  Procedure Laterality Date  . ANGIOPLASTY     stent  . APPENDECTOMY  1957  . CARDIAC DEFIBRILLATOR PLACEMENT    . CAROTID STENT    . EYE SURGERY  1985  . LOWER EXTREMITY ANGIOGRAPHY N/A  05/04/2018   Procedure: LOWER EXTREMITY ANGIOGRAPHY;  Surgeon: [name], MD;  Location: [place] [facility];  Service: Cardiovascular;  Laterality: N/A;  . PERIPHERAL VASCULAR INTERVENTION Bilateral 05/04/2018   Procedure: PERIPHERAL VASCULAR INTERVENTION;  Surgeon: [name], MD;  Location: [place] [facility];  Service: Cardiovascular;  Laterality: Bilateral;  [place]     Current Outpatient Medications  Medication Sig Dispense Refill  . ACCU-CHEK AVIVA PLUS test strip     . aspirin 81 MG tablet Take 81 mg by mouth daily.    Marland Kitchen atorvastatin (LIPITOR) 40 MG tablet Take 40 mg by mouth Daily.     . clopidogrel (PLAVIX) 75 MG tablet TAKE 1 TABLET BY MOUTH EVERY DAY 90 tablet 3  . cyanocobalamin 1000 MCG tablet Take 1,000 mcg by mouth daily.    Marland Kitchen FLOVENT HFA 110 MCG/ACT inhaler Inhale 2 puffs into the lungs 2 (two) times daily.  3  . fluticasone (FLONASE) 50 MCG/ACT nasal spray 2 sprays by Nasal route daily.      . furosemide (LASIX) 40 MG tablet Take 1 tablet (40 mg total) by mouth daily. (Patient taking differently: Take 80 mg by mouth daily. )    . levothyroxine (SYNTHROID, LEVOTHROID)  150 MCG tablet Take 150 mcg by mouth daily before breakfast.     . losartan (COZAAR) 25 MG tablet TAKE 1 TABLET BY MOUTH EVERY DAY 90 tablet 3  . metoprolol (TOPROL-XL) 200 MG 24 hr tablet Take 100 mg by mouth daily.    . mirtazapine (REMERON) 15 MG tablet Take 15 mg by mouth at bedtime.   3  . montelukast (SINGULAIR) 10 MG tablet Take 10 mg by mouth at bedtime.   12  . pantoprazole (PROTONIX) 40 MG tablet Take 40 mg by mouth daily.    [name] Glycol-Propyl Glycol (SYSTANE OP) Place 1 drop into both eyes daily as needed (dry eyes).     No current facility-administered medications for this visit.    Allergies:   Prednisone and Sulfa antibiotics    ROS:  Please see the history of present illness.   Otherwise, review of systems are positive for none.   All other systems are reviewed and  negative.    PHYSICAL EXAM: VS:  BP 138/60   Pulse 64   Temp (!) 96.9 F (36.1 C)   Ht 5\' 9"  (1.753 m)   Wt 181 lb 3.2 oz (82.2 kg)   SpO2 99%   BMI 26.76 kg/m  , BMI Body mass index is 26.76 kg/m. GENERAL:  Well appearing NECK:  No jugular venous distention, waveform within normal limits, carotid upstroke brisk and symmetric, no bruits, no thyromegaly LUNGS:  Clear to auscultation bilaterally CHEST:  Unremarkable HEART:  PMI not displaced or sustained,S1 and S2 within normal limits, no S3, no S4, no clicks, no rubs, no murmurs ABD:  Flat, positive bowel sounds normal in frequency in pitch, no bruits, no rebound, no guarding, no midline pulsatile mass, no hepatomegaly, no splenomegaly EXT:  2 plus pulses throughout, no edema, no cyanosis no clubbing   EKG:  EKG is ordered today. The ekg ordered today demonstrates sinus rhythm, premature ectopic complexes, left bundle branch block, no ST-T wave changes, no change from previous   Recent Labs: 06/27/2018: BUN 22; Creatinine, Ser 1.26; Potassium 4.8; Sodium 142    Lipid Panel    Component Value Date/Time   CHOL 143 06/22/2011 0925   TRIG 125.0 06/22/2011 0925   HDL 36.60 (L) 06/22/2011 0925   CHOLHDL 4 06/22/2011 0925   VLDL 25.0 06/22/2011 0925   LDLCALC 81 06/22/2011 0925      Wt Readings from Last 3 Encounters:  06/15/19 181 lb 3.2 oz (82.2 kg)  04/19/19 175 lb 6.4 oz (79.6 kg)  12/09/18 185 lb (83.9 kg)      Other studies Reviewed: Additional studies/ records that were reviewed today include: Labs. Review of the above records demonstrates:  Please see elsewhere in the note.     ASSESSMENT AND PLAN:  CAD:    The patient has no new sypmtoms.  No further cardiovascular testing is indicated.  We will continue with aggressive risk reduction and meds as listed.  HTN:    Is controlled.  No change in therapy.   DYSLIPIDEMIA:    His LDL was was 62 with an HDL of 38.  No change in therapy.  This was in December of  last year.   CARDIOMYOPATHY:   I had previously tried to titrate him with [facility] but he does not tolerate med titration.  Therefore, no change in therapy.  EDEMA/ERHYTHEMA:     He still nursing the wound on his lower left leg that we evaluated last year after a fall but  it is getting better.  No change in therapy.  ICD:  He has a normally functioning single chamber ICD.  No change in therapy.  COVID EDUCATION: He is going to get the vaccine later this month and he has a scheduled.   Current medicines are reviewed at length with the patient today.  The patient does not have concerns regarding medicines.  The following changes have been made:  no change  Labs/ tests ordered today include: None No orders of the defined types were placed in this encounter.    Disposition:   FU with in one year.     Signed, [name], MD  06/15/2019 3:29 PM    [facility]

## 2019-06-15 ENCOUNTER — Telehealth: Payer: Self-pay

## 2019-06-15 ENCOUNTER — Encounter: Payer: Self-pay | Admitting: Cardiology

## 2019-06-15 ENCOUNTER — Ambulatory Visit: Payer: Medicare Other | Admitting: Cardiology

## 2019-06-15 ENCOUNTER — Other Ambulatory Visit: Payer: Self-pay

## 2019-06-15 VITALS — BP 138/60 | HR 64 | Temp 96.9°F | Ht 69.0 in | Wt 181.2 lb

## 2019-06-15 DIAGNOSIS — I2510 Atherosclerotic heart disease of native coronary artery without angina pectoris: Secondary | ICD-10-CM | POA: Diagnosis not present

## 2019-06-15 DIAGNOSIS — Z95810 Presence of automatic (implantable) cardiac defibrillator: Secondary | ICD-10-CM

## 2019-06-15 DIAGNOSIS — I10 Essential (primary) hypertension: Secondary | ICD-10-CM

## 2019-06-15 DIAGNOSIS — E785 Hyperlipidemia, unspecified: Secondary | ICD-10-CM | POA: Diagnosis not present

## 2019-06-15 DIAGNOSIS — I255 Ischemic cardiomyopathy: Secondary | ICD-10-CM | POA: Diagnosis not present

## 2019-06-15 DIAGNOSIS — M7989 Other specified soft tissue disorders: Secondary | ICD-10-CM

## 2019-06-15 DIAGNOSIS — Z7189 Other specified counseling: Secondary | ICD-10-CM

## 2019-06-15 NOTE — Patient Instructions (Signed)
Medication Instructions:  No changes *If you need a refill on your cardiac medications before your next appointment, please call your pharmacy*  Lab Work: None  Testing/Procedures: None  Follow-Up: At [facility], you and your health needs are our priority.  As part of our continuing mission to provide you with exceptional heart care, we have created designated Provider Care Teams.  These Care Teams include your primary Cardiologist (physician) and Advanced Practice Providers (APPs -  Physician Assistants and Nurse Practitioners) who all work together to provide you with the care you need, when you need it.  Your next appointment:   1 year(s)  You will receive a reminder letter in the mail two months in advance. If you don't receive a letter, please call our office to schedule the follow-up appointment.   The format for your next appointment:   In Person  Provider:   James Hochrein, MD  

## 2019-06-15 NOTE — Telephone Encounter (Signed)
I called the pt to let him know he do not have to send manual transmissions but no answer/no voicemail.

## 2019-06-17 NOTE — Progress Notes (Signed)
ICD remote 

## 2019-06-22 DIAGNOSIS — I10 Essential (primary) hypertension: Secondary | ICD-10-CM | POA: Diagnosis not present

## 2019-06-22 DIAGNOSIS — I420 Dilated cardiomyopathy: Secondary | ICD-10-CM | POA: Diagnosis not present

## 2019-06-22 DIAGNOSIS — I739 Peripheral vascular disease, unspecified: Secondary | ICD-10-CM | POA: Diagnosis not present

## 2019-06-22 DIAGNOSIS — J301 Allergic rhinitis due to pollen: Secondary | ICD-10-CM | POA: Diagnosis not present

## 2019-08-15 ENCOUNTER — Ambulatory Visit (INDEPENDENT_AMBULATORY_CARE_PROVIDER_SITE_OTHER): Payer: Medicare Other | Admitting: *Deleted

## 2019-08-15 DIAGNOSIS — Z95810 Presence of automatic (implantable) cardiac defibrillator: Secondary | ICD-10-CM

## 2019-08-15 LAB — CUP PACEART REMOTE DEVICE CHECK
Battery Remaining Longevity: 66 mo
Battery Remaining Percentage: 70 %
Brady Statistic RV Percent Paced: 1 %
Date Time Interrogation Session: 20210315225600
HighPow Impedance: 57 Ohm
Implantable Lead Implant Date: 20020206
Implantable Lead Implant Date: 20071005
Implantable Lead Location: 753860
Implantable Lead Location: 753860
Implantable Lead Model: 148
Implantable Lead Model: 4088
Implantable Lead Serial Number: 109512
Implantable Lead Serial Number: 224392
Implantable Pulse Generator Implant Date: 20120330
Lead Channel Impedance Value: 468 Ohm
Lead Channel Pacing Threshold Amplitude: 1.9 V
Lead Channel Pacing Threshold Pulse Width: 1 ms
Lead Channel Setting Pacing Amplitude: 3.5 V
Lead Channel Setting Pacing Pulse Width: 1 ms
Lead Channel Setting Sensing Sensitivity: 0.5 mV
Pulse Gen Serial Number: 114258

## 2019-08-15 NOTE — Progress Notes (Signed)
ICD Remote  

## 2019-08-16 DIAGNOSIS — I2510 Atherosclerotic heart disease of native coronary artery without angina pectoris: Secondary | ICD-10-CM | POA: Diagnosis not present

## 2019-08-16 DIAGNOSIS — E118 Type 2 diabetes mellitus with unspecified complications: Secondary | ICD-10-CM | POA: Diagnosis not present

## 2019-08-16 DIAGNOSIS — Z139 Encounter for screening, unspecified: Secondary | ICD-10-CM | POA: Diagnosis not present

## 2019-08-16 DIAGNOSIS — E785 Hyperlipidemia, unspecified: Secondary | ICD-10-CM | POA: Diagnosis not present

## 2019-08-16 DIAGNOSIS — E063 Autoimmune thyroiditis: Secondary | ICD-10-CM | POA: Diagnosis not present

## 2019-08-29 DIAGNOSIS — M19042 Primary osteoarthritis, left hand: Secondary | ICD-10-CM | POA: Diagnosis not present

## 2019-08-29 DIAGNOSIS — M79642 Pain in left hand: Secondary | ICD-10-CM | POA: Diagnosis not present

## 2019-08-29 DIAGNOSIS — M7989 Other specified soft tissue disorders: Secondary | ICD-10-CM | POA: Diagnosis not present

## 2019-09-05 DIAGNOSIS — L570 Actinic keratosis: Secondary | ICD-10-CM | POA: Diagnosis not present

## 2019-10-10 DIAGNOSIS — J029 Acute pharyngitis, unspecified: Secondary | ICD-10-CM | POA: Diagnosis not present

## 2019-11-14 ENCOUNTER — Ambulatory Visit (INDEPENDENT_AMBULATORY_CARE_PROVIDER_SITE_OTHER): Payer: Medicare Other | Admitting: *Deleted

## 2019-11-14 DIAGNOSIS — I472 Ventricular tachycardia, unspecified: Secondary | ICD-10-CM

## 2019-11-14 DIAGNOSIS — H6982 Other specified disorders of Eustachian tube, left ear: Secondary | ICD-10-CM | POA: Diagnosis not present

## 2019-11-14 LAB — CUP PACEART REMOTE DEVICE CHECK
Battery Remaining Longevity: 60 mo
Battery Remaining Percentage: 65 %
Brady Statistic RV Percent Paced: 1 %
Date Time Interrogation Session: 20210615080300
HighPow Impedance: 56 Ohm
Implantable Lead Implant Date: 20020206
Implantable Lead Implant Date: 20071005
Implantable Lead Location: 753860
Implantable Lead Location: 753860
Implantable Lead Model: 148
Implantable Lead Model: 4088
Implantable Lead Serial Number: 109512
Implantable Lead Serial Number: 224392
Implantable Pulse Generator Implant Date: 20120330
Lead Channel Impedance Value: 468 Ohm
Lead Channel Pacing Threshold Amplitude: 1.9 V
Lead Channel Pacing Threshold Pulse Width: 1 ms
Lead Channel Setting Pacing Amplitude: 3.5 V
Lead Channel Setting Pacing Pulse Width: 1 ms
Lead Channel Setting Sensing Sensitivity: 0.5 mV
Pulse Gen Serial Number: 114258

## 2019-11-15 NOTE — Progress Notes (Signed)
Remote ICD transmission.   

## 2019-11-16 DIAGNOSIS — E063 Autoimmune thyroiditis: Secondary | ICD-10-CM | POA: Diagnosis not present

## 2019-11-16 DIAGNOSIS — Z9861 Coronary angioplasty status: Secondary | ICD-10-CM | POA: Diagnosis not present

## 2019-11-16 DIAGNOSIS — Z79899 Other long term (current) drug therapy: Secondary | ICD-10-CM | POA: Diagnosis not present

## 2019-11-16 DIAGNOSIS — E118 Type 2 diabetes mellitus with unspecified complications: Secondary | ICD-10-CM | POA: Diagnosis not present

## 2019-11-16 DIAGNOSIS — E785 Hyperlipidemia, unspecified: Secondary | ICD-10-CM | POA: Diagnosis not present

## 2019-11-16 DIAGNOSIS — J029 Acute pharyngitis, unspecified: Secondary | ICD-10-CM | POA: Diagnosis not present

## 2019-11-16 DIAGNOSIS — I2510 Atherosclerotic heart disease of native coronary artery without angina pectoris: Secondary | ICD-10-CM | POA: Diagnosis not present

## 2020-01-02 DIAGNOSIS — I420 Dilated cardiomyopathy: Secondary | ICD-10-CM | POA: Diagnosis not present

## 2020-01-02 DIAGNOSIS — E063 Autoimmune thyroiditis: Secondary | ICD-10-CM | POA: Diagnosis not present

## 2020-01-02 DIAGNOSIS — R0602 Shortness of breath: Secondary | ICD-10-CM | POA: Diagnosis not present

## 2020-01-02 DIAGNOSIS — D649 Anemia, unspecified: Secondary | ICD-10-CM | POA: Diagnosis not present

## 2020-01-02 DIAGNOSIS — K2100 Gastro-esophageal reflux disease with esophagitis, without bleeding: Secondary | ICD-10-CM | POA: Diagnosis not present

## 2020-01-30 ENCOUNTER — Telehealth: Payer: Self-pay | Admitting: Cardiology

## 2020-01-30 NOTE — Telephone Encounter (Signed)
Patient's daughter, Fraser Din, is calling to get suggestions for a new PCP in the [place] area within the [employer]. Please advise.

## 2020-02-01 NOTE — Telephone Encounter (Signed)
In each of our exam rooms we have a message to patients with a number to call for primary MD.  I have asked many different MDs recently to take on new patients and I am not having much luck.

## 2020-02-06 NOTE — Telephone Encounter (Signed)
Left message to call back, will give [place] # [phone]

## 2020-02-07 NOTE — Telephone Encounter (Signed)
Returned call to patients daughter (ok per DPR) about a number to contact to try and find a PCP for patient. Number for [place] given, Advised to let us know if anything else was needed.

## 2020-02-07 NOTE — Telephone Encounter (Signed)
Patient's daughter returning call. 

## 2020-02-13 ENCOUNTER — Ambulatory Visit (INDEPENDENT_AMBULATORY_CARE_PROVIDER_SITE_OTHER): Payer: Medicare Other | Admitting: *Deleted

## 2020-02-13 DIAGNOSIS — I472 Ventricular tachycardia, unspecified: Secondary | ICD-10-CM

## 2020-02-14 LAB — CUP PACEART REMOTE DEVICE CHECK
Battery Remaining Longevity: 60 mo
Battery Remaining Percentage: 62 %
Brady Statistic RV Percent Paced: 1 %
Date Time Interrogation Session: 20210914082800
HighPow Impedance: 58 Ohm
Implantable Lead Implant Date: 20020206
Implantable Lead Implant Date: 20071005
Implantable Lead Location: 753860
Implantable Lead Location: 753860
Implantable Lead Model: 148
Implantable Lead Model: 4088
Implantable Lead Serial Number: 109512
Implantable Lead Serial Number: 224392
Implantable Pulse Generator Implant Date: 20120330
Lead Channel Impedance Value: 454 Ohm
Lead Channel Pacing Threshold Amplitude: 1.9 V
Lead Channel Pacing Threshold Pulse Width: 1 ms
Lead Channel Setting Pacing Amplitude: 3.5 V
Lead Channel Setting Pacing Pulse Width: 1 ms
Lead Channel Setting Sensing Sensitivity: 0.5 mV
Pulse Gen Serial Number: 114258

## 2020-02-15 NOTE — Progress Notes (Signed)
Remote ICD transmission.   

## 2020-02-19 DIAGNOSIS — Z79899 Other long term (current) drug therapy: Secondary | ICD-10-CM | POA: Diagnosis not present

## 2020-02-19 DIAGNOSIS — E785 Hyperlipidemia, unspecified: Secondary | ICD-10-CM | POA: Diagnosis not present

## 2020-02-19 DIAGNOSIS — E118 Type 2 diabetes mellitus with unspecified complications: Secondary | ICD-10-CM | POA: Diagnosis not present

## 2020-02-19 DIAGNOSIS — D649 Anemia, unspecified: Secondary | ICD-10-CM | POA: Diagnosis not present

## 2020-02-19 DIAGNOSIS — E063 Autoimmune thyroiditis: Secondary | ICD-10-CM | POA: Diagnosis not present

## 2020-02-19 DIAGNOSIS — Z23 Encounter for immunization: Secondary | ICD-10-CM | POA: Diagnosis not present

## 2020-02-26 DIAGNOSIS — I420 Dilated cardiomyopathy: Secondary | ICD-10-CM | POA: Diagnosis not present

## 2020-02-26 DIAGNOSIS — D500 Iron deficiency anemia secondary to blood loss (chronic): Secondary | ICD-10-CM | POA: Diagnosis not present

## 2020-03-14 DIAGNOSIS — J449 Chronic obstructive pulmonary disease, unspecified: Secondary | ICD-10-CM | POA: Diagnosis not present

## 2020-03-14 DIAGNOSIS — J069 Acute upper respiratory infection, unspecified: Secondary | ICD-10-CM | POA: Diagnosis not present

## 2020-03-14 DIAGNOSIS — J301 Allergic rhinitis due to pollen: Secondary | ICD-10-CM | POA: Diagnosis not present

## 2020-03-14 DIAGNOSIS — B349 Viral infection, unspecified: Secondary | ICD-10-CM | POA: Diagnosis not present

## 2020-03-26 DIAGNOSIS — J449 Chronic obstructive pulmonary disease, unspecified: Secondary | ICD-10-CM | POA: Diagnosis not present

## 2020-03-26 DIAGNOSIS — K2100 Gastro-esophageal reflux disease with esophagitis, without bleeding: Secondary | ICD-10-CM | POA: Diagnosis not present

## 2020-03-26 DIAGNOSIS — D649 Anemia, unspecified: Secondary | ICD-10-CM | POA: Diagnosis not present

## 2020-04-17 ENCOUNTER — Other Ambulatory Visit: Payer: Self-pay | Admitting: Vascular Surgery

## 2020-05-14 ENCOUNTER — Ambulatory Visit (INDEPENDENT_AMBULATORY_CARE_PROVIDER_SITE_OTHER): Payer: Medicare Other

## 2020-05-14 DIAGNOSIS — I472 Ventricular tachycardia, unspecified: Secondary | ICD-10-CM

## 2020-05-14 LAB — CUP PACEART REMOTE DEVICE CHECK
Battery Remaining Longevity: 54 mo
Battery Remaining Percentage: 57 %
Brady Statistic RV Percent Paced: 1 %
Date Time Interrogation Session: 20211214090800
HighPow Impedance: 61 Ohm
Implantable Lead Implant Date: 20020206
Implantable Lead Implant Date: 20071005
Implantable Lead Location: 753860
Implantable Lead Location: 753860
Implantable Lead Model: 148
Implantable Lead Model: 4088
Implantable Lead Serial Number: 109512
Implantable Lead Serial Number: 224392
Implantable Pulse Generator Implant Date: 20120330
Lead Channel Impedance Value: 469 Ohm
Lead Channel Pacing Threshold Amplitude: 1.9 V
Lead Channel Pacing Threshold Pulse Width: 1 ms
Lead Channel Setting Pacing Amplitude: 3.5 V
Lead Channel Setting Pacing Pulse Width: 1 ms
Lead Channel Setting Sensing Sensitivity: 0.5 mV
Pulse Gen Serial Number: 114258

## 2020-05-16 ENCOUNTER — Other Ambulatory Visit: Payer: Self-pay | Admitting: Cardiology

## 2020-05-29 NOTE — Progress Notes (Signed)
Remote ICD transmission.   

## 2020-06-11 ENCOUNTER — Encounter: Payer: Medicare Other | Admitting: Internal Medicine

## 2020-06-20 ENCOUNTER — Ambulatory Visit: Payer: Medicare Other | Admitting: Cardiology

## 2020-07-02 DIAGNOSIS — S8012XA Contusion of left lower leg, initial encounter: Secondary | ICD-10-CM | POA: Diagnosis not present

## 2020-07-25 NOTE — Progress Notes (Addendum)
Cardiology Office Note   Date:  07/26/2020   ID:  [name], [name] 1933/06/22, MRN [id number]  PCP:  [name]., MD  Cardiologist:   [name], MD   Chief Complaint  Patient presents with  . Shortness of Breath      History of Present Illness: [name] is a 87 y.o. male who presents for followup of his coronary disease and cardiomyopathy.   At the last visit his EF was lower than previous at 25% and I started [place].  However, he developed significant hypotension and has not tolerated this and has been off that med.  A repeat echo was unchanged at 25%.    Since I last saw him he presents for follow-up.  He has been having shortness of breath.  It is hard to tease out of him but his daughter says that he short of breath walking a moderate distance on level ground although he is not describing PND or orthopnea.  He has some occasional dizziness but he is not having any palpitations, presyncope or syncope.  He is not having any chest pressure, neck or arm discomfort.  He bumped his leg and is nursing a wound on that.  He has had some mild lower extremity swelling.  Past Medical History:  Diagnosis Date  . Atrial flutter ([place])    (I could not find documentation of this rhythm.)  . AUTOMATIC IMPLANTABLE CARDIAC DEFIBRILLATOR SITU   . Automatic implantable cardioverter-defibrillator in situ 12/05/2009   Qualifier: Diagnosis of  By: [name], MD, [name]   . CAD (coronary artery disease) 05/22/2011  . Carotid stenosis 05/22/2011  . CHF CONGESTIVE HEART FAILURE    45% by echo 2015  . COPD 12/01/2007   Qualifier: Diagnosis of  By: [name] NP, [name]    . DM2 (diabetes mellitus, type 2) ([place])   . DYSLIPIDEMIA   . DYSPNEA   . Edema   . Essential hypertension 11/17/2007   Qualifier: Diagnosis of  By: [name]    . Gout   . HYPERTENSION   . HYPOTHYROIDISM   . MYOCARDIAL INFARCTION    MI 1999, stent x 2 to RCA, 70% circ, 30 and 40 % LADs  . WEIGHT  GAIN, ABNORMAL     Past Surgical History:  Procedure Laterality Date  . ANGIOPLASTY     stent  . APPENDECTOMY  1957  . CARDIAC DEFIBRILLATOR PLACEMENT    . CAROTID STENT    . EYE SURGERY  1985  . LOWER EXTREMITY ANGIOGRAPHY N/A 05/04/2018   Procedure: LOWER EXTREMITY ANGIOGRAPHY;  Surgeon: [name], MD;  Location: [place] [facility];  Service: Cardiovascular;  Laterality: N/A;  . PERIPHERAL VASCULAR INTERVENTION Bilateral 05/04/2018   Procedure: PERIPHERAL VASCULAR INTERVENTION;  Surgeon: [name], MD;  Location: [place] [facility];  Service: Cardiovascular;  Laterality: Bilateral;  [place]     Current Outpatient Medications  Medication Sig Dispense Refill  . ACCU-CHEK AVIVA PLUS test strip     . aspirin 81 MG tablet Take 81 mg by mouth daily.    Marland Kitchen atorvastatin (LIPITOR) 40 MG tablet Take 40 mg by mouth Daily.     . clopidogrel (PLAVIX) 75 MG tablet TAKE 1 TABLET BY MOUTH EVERY DAY 90 tablet 3  . cyanocobalamin 1000 MCG tablet Take 1,000 mcg by mouth daily.    Marland Kitchen FLOVENT HFA 110 MCG/ACT inhaler Inhale 2 puffs into the lungs 2 (two) times daily.  3  .  fluticasone (FLONASE) 50 MCG/ACT nasal spray Place 2 sprays into the nose daily.    . metoprolol (TOPROL-XL) 200 MG 24 hr tablet Take 100 mg by mouth daily.    . montelukast (SINGULAIR) 10 MG tablet Take 10 mg by mouth at bedtime.   12  . pantoprazole (PROTONIX) 40 MG tablet Take 40 mg by mouth daily.    [name] Glycol-Propyl Glycol (SYSTANE OP) Place 1 drop into both eyes daily as needed (dry eyes).    . SYNTHROID 175 MCG tablet Take 175 mcg by mouth. Alternate between 175 mcg and 220mcg    . losartan (COZAAR) 25 MG tablet Take 1 tablet (25 mg total) by mouth in the morning and at bedtime. 180 tablet 3  . mirtazapine (REMERON) 30 MG tablet Take 30 mg by mouth at bedtime.     No current facility-administered medications for this visit.    Allergies:   Prednisone and Sulfa antibiotics    ROS:  Please see  the history of present illness.   Otherwise, review of systems are positive for none.   All other systems are reviewed and negative.    PHYSICAL EXAM: VS:  BP (!) 152/62 (BP Location: Left Arm, Patient Position: Sitting)   Pulse (!) 53   Ht 5\' 9"  (1.753 m)   Wt 168 lb 3.2 oz (76.3 kg)   SpO2 98%   BMI 24.84 kg/m  , BMI Body mass index is 24.84 kg/m. GENERAL:  Well appearing NECK:  No jugular venous distention, waveform within normal limits, carotid upstroke brisk and symmetric, no bruits, no thyromegaly LUNGS:  Clear to auscultation bilaterally CHEST:  Unremarkable HEART:  PMI not displaced or sustained,S1 and S2 within normal limits, no S3, no S4, no clicks, no rubs, no murmurs ABD:  Flat, positive bowel sounds normal in frequency in pitch, no bruits, no rebound, no guarding, no midline pulsatile mass, no hepatomegaly, no splenomegaly EXT:  2 plus pulses throughout, mild bilateral edema, no cyanosis no clubbing  EKG:  EKG is  ordered today. The ekg ordered today demonstrates sinus rhythm with a rate of 53,  left bundle branch block, no ST-T wave changes, no change from previous   Recent Labs: No results found for requested labs within last 8760 hours.    Lipid Panel    Component Value Date/Time   CHOL 143 06/22/2011 0925   TRIG 125.0 06/22/2011 0925   HDL 36.60 (L) 06/22/2011 0925   CHOLHDL 4 06/22/2011 0925   VLDL 25.0 06/22/2011 0925   LDLCALC 81 06/22/2011 0925      Wt Readings from Last 3 Encounters:  07/26/20 168 lb 3.2 oz (76.3 kg)  06/15/19 181 lb 3.2 oz (82.2 kg)  04/19/19 175 lb 6.4 oz (79.6 kg)      Other studies Reviewed: Additional studies/ records that were reviewed today include: Review of most recent ICD download. Review of the above records demonstrates:  Please see elsewhere in the note.     ASSESSMENT AND PLAN:  CAD:    Medically he is having any ongoing anginal symptoms.   No further ischemia work up is suggested.   HTN:    His blood  pressure is elevated but has been low before and he is maybe titrate down on his medications as he has not tolerated this.  I am going to try to go up on his Cozaar again to 25 mg twice daily.   DYSLIPIDEMIA:    His LDL was 43 with an HDL of  35.  No change in therapy.   CARDIOMYOPATHY:   He does not tolerate med titration.  I think he really has class III symptoms at this point.  He is not due to have his device exchanged for another 4 1/2 half years or so but I would like to have him consider for CRT upgrade and I will send this message to Dr. [name] who sees him in April.  I will be titrating his Cozaar as above.  I will be checking a BNP level  ICD:   As above.    Current medicines are reviewed at length with the patient today.  The patient does not have concerns regarding medicines.  The following changes have been made: As above  Labs/ tests ordered today include:   Orders Placed This Encounter  Procedures  . Brain natriuretic peptide  . EKG 12-Lead     Disposition:   FU with in me in June   Signed, Deion Forgue, MD  07/26/2020 3:35 PM    [facility]

## 2020-07-26 ENCOUNTER — Other Ambulatory Visit: Payer: Self-pay

## 2020-07-26 ENCOUNTER — Ambulatory Visit: Payer: Medicare Other | Admitting: Cardiology

## 2020-07-26 ENCOUNTER — Encounter: Payer: Self-pay | Admitting: Cardiology

## 2020-07-26 VITALS — BP 152/62 | HR 53 | Ht 69.0 in | Wt 168.2 lb

## 2020-07-26 DIAGNOSIS — R0602 Shortness of breath: Secondary | ICD-10-CM

## 2020-07-26 DIAGNOSIS — E785 Hyperlipidemia, unspecified: Secondary | ICD-10-CM

## 2020-07-26 DIAGNOSIS — I2510 Atherosclerotic heart disease of native coronary artery without angina pectoris: Secondary | ICD-10-CM | POA: Diagnosis not present

## 2020-07-26 DIAGNOSIS — I10 Essential (primary) hypertension: Secondary | ICD-10-CM | POA: Diagnosis not present

## 2020-07-26 MED ORDER — LOSARTAN POTASSIUM 25 MG PO TABS: 25.0000 mg | ORAL_TABLET | Freq: Two times a day (BID) | ORAL | 3 refills | Status: DC

## 2020-07-26 NOTE — Patient Instructions (Signed)
Medication Instructions:  INCREASE COZAAR TO 25mg , TAKE 1 TABLET BY MOUTH TWICE A DAY *If you need a refill on your cardiac medications before your next appointment, please call your pharmacy*  Lab Work: Your physician recommends that you return for lab work TODAY: BNP If you have labs (blood work) drawn today and your tests are completely normal, you will receive your results only by: Marland Kitchen MyChart Message (if you have MyChart) OR . A paper copy in the mail If you have any lab test that is abnormal or we need to change your treatment, we will call you to review the results.  Follow-Up: At [facility], you and your health needs are our priority.  As part of our continuing mission to provide you with exceptional heart care, we have created designated Provider Care Teams.  These Care Teams include your primary Cardiologist (physician) and Advanced Practice Providers (APPs -  Physician Assistants and Nurse Practitioners) who all work together to provide you with the care you need, when you need it.  Your next appointment:   4 month(s) - June 2022  The format for your next appointment:   In Person  Provider:   [name], MD

## 2020-07-27 LAB — BRAIN NATRIURETIC PEPTIDE: BNP: 880.1 pg/mL — ABNORMAL HIGH (ref 0.0–100.0)

## 2020-07-29 ENCOUNTER — Other Ambulatory Visit: Payer: Self-pay

## 2020-07-29 MED ORDER — FUROSEMIDE 20 MG PO TABS: 20.0000 mg | ORAL_TABLET | Freq: Every day | ORAL | 3 refills | Status: DC

## 2020-07-29 NOTE — Progress Notes (Signed)
20mg  Lasix PO Daily added per Dr. Rosezella Florida result note. Attempted to call patient, no voicemail available.

## 2020-07-30 DIAGNOSIS — J449 Chronic obstructive pulmonary disease, unspecified: Secondary | ICD-10-CM | POA: Diagnosis not present

## 2020-07-30 DIAGNOSIS — I739 Peripheral vascular disease, unspecified: Secondary | ICD-10-CM | POA: Diagnosis not present

## 2020-07-30 DIAGNOSIS — E1169 Type 2 diabetes mellitus with other specified complication: Secondary | ICD-10-CM | POA: Diagnosis not present

## 2020-07-30 DIAGNOSIS — N1831 Chronic kidney disease, stage 3a: Secondary | ICD-10-CM | POA: Diagnosis not present

## 2020-07-30 DIAGNOSIS — D631 Anemia in chronic kidney disease: Secondary | ICD-10-CM | POA: Diagnosis not present

## 2020-07-30 DIAGNOSIS — N189 Chronic kidney disease, unspecified: Secondary | ICD-10-CM | POA: Diagnosis not present

## 2020-07-30 DIAGNOSIS — I420 Dilated cardiomyopathy: Secondary | ICD-10-CM | POA: Diagnosis not present

## 2020-07-30 DIAGNOSIS — E785 Hyperlipidemia, unspecified: Secondary | ICD-10-CM | POA: Diagnosis not present

## 2020-07-30 DIAGNOSIS — R7989 Other specified abnormal findings of blood chemistry: Secondary | ICD-10-CM | POA: Diagnosis not present

## 2020-07-30 DIAGNOSIS — Z9181 History of falling: Secondary | ICD-10-CM | POA: Diagnosis not present

## 2020-07-30 DIAGNOSIS — I4892 Unspecified atrial flutter: Secondary | ICD-10-CM | POA: Diagnosis not present

## 2020-07-30 DIAGNOSIS — E063 Autoimmune thyroiditis: Secondary | ICD-10-CM | POA: Diagnosis not present

## 2020-08-13 ENCOUNTER — Ambulatory Visit (INDEPENDENT_AMBULATORY_CARE_PROVIDER_SITE_OTHER): Payer: Medicare Other

## 2020-08-13 DIAGNOSIS — I472 Ventricular tachycardia, unspecified: Secondary | ICD-10-CM

## 2020-08-13 LAB — CUP PACEART REMOTE DEVICE CHECK
Battery Remaining Longevity: 48 mo
Battery Remaining Percentage: 52 %
Brady Statistic RV Percent Paced: 1 %
Date Time Interrogation Session: 20220315085300
HighPow Impedance: 59 Ohm
Implantable Lead Implant Date: 20020206
Implantable Lead Implant Date: 20071005
Implantable Lead Location: 753860
Implantable Lead Location: 753860
Implantable Lead Model: 148
Implantable Lead Model: 4088
Implantable Lead Serial Number: 109512
Implantable Lead Serial Number: 224392
Implantable Pulse Generator Implant Date: 20120330
Lead Channel Impedance Value: 467 Ohm
Lead Channel Pacing Threshold Amplitude: 1.9 V
Lead Channel Pacing Threshold Pulse Width: 1 ms
Lead Channel Setting Pacing Amplitude: 3.5 V
Lead Channel Setting Pacing Pulse Width: 1 ms
Lead Channel Setting Sensing Sensitivity: 0.5 mV
Pulse Gen Serial Number: 114258

## 2020-08-21 NOTE — Progress Notes (Signed)
Remote ICD transmission.   

## 2020-09-03 ENCOUNTER — Ambulatory Visit: Payer: Medicare Other | Admitting: Internal Medicine

## 2020-09-03 ENCOUNTER — Encounter: Payer: Self-pay | Admitting: Internal Medicine

## 2020-09-03 ENCOUNTER — Other Ambulatory Visit: Payer: Self-pay

## 2020-09-03 VITALS — BP 132/56 | HR 56 | Ht 69.0 in | Wt 166.8 lb

## 2020-09-03 DIAGNOSIS — Z95810 Presence of automatic (implantable) cardiac defibrillator: Secondary | ICD-10-CM

## 2020-09-03 DIAGNOSIS — I5022 Chronic systolic (congestive) heart failure: Secondary | ICD-10-CM | POA: Diagnosis not present

## 2020-09-03 DIAGNOSIS — I255 Ischemic cardiomyopathy: Secondary | ICD-10-CM | POA: Diagnosis not present

## 2020-09-03 NOTE — Patient Instructions (Signed)
Medication Instructions:  Your physician recommends that you continue on your current medications as directed. Please refer to the Current Medication list given to you today.  Labwork: None ordered.  Testing/Procedures: None ordered.  Follow-Up: Your physician wants you to follow-up in: one year with Cristopher Peru, MD or one of the following Advanced Practice Providers on your designated Care Team:    Chanetta Marshall, NP  Tommye Standard, PA-C  Legrand Como "Jonni Sanger" [place], [place]  Remote monitoring is used to monitor your ICD from home. This monitoring reduces the number of office visits required to check your device to one time per year. It allows [place] to keep an eye on the functioning of your device to ensure it is working properly. You are scheduled for a device check from home on 11/12/2020. You may send your transmission at any time that day. If you have a wireless device, the transmission will be sent automatically. After your physician reviews your transmission, you will receive a postcard with your next transmission date.  Any Other Special Instructions Will Be Listed Below (If Applicable).  If you need a refill on your cardiac medications before your next appointment, please call your pharmacy.

## 2020-09-03 NOTE — Progress Notes (Signed)
HPI Mr. Penning returns today for followup. He is a pleasant 87 yo man with a h/o CAD, chronic systolic heart failure who underwent ICD insertion almost 20 years ago. He had 2 ICD shock with his first device, none since. He has developed worsening CHF. Attempts at uptitration of medical therapy with entresto resulted in hypotension. He has been on a higher dose of cozaar but he still has class 3 symptoms. He denies syncope. No edema. Main symptom is sob with exertion. He has LBBB and a QRS duration of 160. Allergies  Allergen Reactions  . Prednisone Rash  . Sulfa Antibiotics Other (See Comments)    Hyper      Current Outpatient Medications  Medication Sig Dispense Refill  . ACCU-CHEK AVIVA PLUS test strip     . aspirin 81 MG tablet Take 81 mg by mouth daily.    Marland Kitchen atorvastatin (LIPITOR) 40 MG tablet Take 40 mg by mouth Daily.     . clopidogrel (PLAVIX) 75 MG tablet TAKE 1 TABLET BY MOUTH EVERY DAY 90 tablet 3  . cyanocobalamin 1000 MCG tablet Take 1,000 mcg by mouth daily.    Marland Kitchen FLOVENT HFA 110 MCG/ACT inhaler Inhale 2 puffs into the lungs 2 (two) times daily.  3  . fluticasone (FLONASE) 50 MCG/ACT nasal spray Place 2 sprays into the nose daily.    . furosemide (LASIX) 20 MG tablet Take 1 tablet (20 mg total) by mouth daily. 90 tablet 3  . losartan (COZAAR) 25 MG tablet Take 1 tablet (25 mg total) by mouth in the morning and at bedtime. 180 tablet 3  . metoprolol (TOPROL-XL) 200 MG 24 hr tablet Take 100 mg by mouth daily.    . mirtazapine (REMERON) 30 MG tablet Take 30 mg by mouth at bedtime.    . montelukast (SINGULAIR) 10 MG tablet Take 10 mg by mouth at bedtime.   12  . pantoprazole (PROTONIX) 40 MG tablet Take 40 mg by mouth daily.    [name] Glycol-Propyl Glycol (SYSTANE OP) Place 1 drop into both eyes daily as needed (dry eyes).    . SYNTHROID 200 MCG tablet Take 200 mcg by mouth daily.     No current facility-administered medications for this visit.     Past  Medical History:  Diagnosis Date  . Atrial flutter ([place])    (I could not find documentation of this rhythm.)  . AUTOMATIC IMPLANTABLE CARDIAC DEFIBRILLATOR SITU   . Automatic implantable cardioverter-defibrillator in situ 12/05/2009   Qualifier: Diagnosis of  By: [name], MD, [name]   . CAD (coronary artery disease) 05/22/2011  . Carotid stenosis 05/22/2011  . CHF CONGESTIVE HEART FAILURE    45% by echo 2015  . COPD 12/01/2007   Qualifier: Diagnosis of  By: [name] NP, [name]    . DM2 (diabetes mellitus, type 2) ([place])   . DYSLIPIDEMIA   . DYSPNEA   . Edema   . Essential hypertension 11/17/2007   Qualifier: Diagnosis of  By: [name]    . Gout   . HYPERTENSION   . HYPOTHYROIDISM   . MYOCARDIAL INFARCTION    MI 1999, stent x 2 to RCA, 70% circ, 30 and 40 % LADs  . WEIGHT GAIN, ABNORMAL     ROS:   All systems reviewed and negative except as noted in the HPI.   Past Surgical History:  Procedure Laterality Date  . ANGIOPLASTY     stent  . APPENDECTOMY  1957  .  CARDIAC DEFIBRILLATOR PLACEMENT    . CAROTID STENT    . EYE SURGERY  1985  . LOWER EXTREMITY ANGIOGRAPHY N/A 05/04/2018   Procedure: LOWER EXTREMITY ANGIOGRAPHY;  Surgeon: [name], MD;  Location: [place] [facility];  Service: Cardiovascular;  Laterality: N/A;  . PERIPHERAL VASCULAR INTERVENTION Bilateral 05/04/2018   Procedure: PERIPHERAL VASCULAR INTERVENTION;  Surgeon: [name], MD;  Location: [place] [facility];  Service: Cardiovascular;  Laterality: Bilateral;  [place]     Family History  Problem Relation Age of Onset  . Other Mother        natural causes  . Kidney disease Father   . Cancer Brother        throat cancer     Social History   Socioeconomic History  . Marital status: Married    Spouse name: Not on file  . Number of children: Not on file  . Years of education: Not on file  . Highest education level: Not on file  Occupational History  . Not on file   Tobacco Use  . Smoking status: Former Smoker    Quit date: 12/10/1983    Years since quitting: 36.7  . Smokeless tobacco: Never Used  . Tobacco comment: quit 1985  Substance and Sexual Activity  . Alcohol use: No  . Drug use: No  . Sexual activity: Not on file  Other Topics Concern  . Not on file  Social History Narrative  . Not on file   Social Determinants of Health   Financial Resource Strain: Not on file  Food Insecurity: Not on file  Transportation Needs: Not on file  Physical Activity: Not on file  Stress: Not on file  Social Connections: Not on file  Intimate Partner Violence: Not on file     BP (!) 132/56   Pulse (!) 56   Ht 5\' 9"  (1.753 m)   Wt 166 lb 12.8 oz (75.7 kg)   SpO2 95%   BMI 24.63 kg/m   Physical Exam:  Well appearing NAD HEENT: Unremarkable Neck:  No JVD, no thyromegally Lymphatics:  No adenopathy Back:  No CVA tenderness Lungs:  Clear with no wheezes HEART:  Regular rate rhythm, no murmurs, no rubs, no clicks Abd:  soft, positive bowel sounds, no organomegally, no rebound, no guarding Ext:  2 plus pulses, no edema, no cyanosis, no clubbing Skin:  No rashes no nodules Neuro:  CN II through XII intact, motor grossly intact  EKG - nsr with LBBB  DEVICE  Normal device function.  See PaceArt for details.   Assess/Plan: 1. ICD - his device has about 4 years of battery longevity. His threshold is stable. 2. Chronic systolic heart failure - his symptoms are probably class 3. I will discuss upgrade to a Biv ICD with Dr. Lenore Cordia. 3. CAD - he denies anginal symptoms. He has severe LV dysfunction. 4. Dyslipidemia - he will continue lipitor.  Carleene Overlie Shivan Hodes,MD

## 2020-09-10 DIAGNOSIS — K219 Gastro-esophageal reflux disease without esophagitis: Secondary | ICD-10-CM | POA: Diagnosis not present

## 2020-09-10 DIAGNOSIS — T485X5A Adverse effect of other anti-common-cold drugs, initial encounter: Secondary | ICD-10-CM | POA: Diagnosis not present

## 2020-09-10 DIAGNOSIS — K2270 Barrett's esophagus without dysplasia: Secondary | ICD-10-CM | POA: Diagnosis not present

## 2020-09-10 DIAGNOSIS — J302 Other seasonal allergic rhinitis: Secondary | ICD-10-CM | POA: Diagnosis not present

## 2020-09-10 DIAGNOSIS — J310 Chronic rhinitis: Secondary | ICD-10-CM | POA: Diagnosis not present

## 2020-09-10 DIAGNOSIS — J029 Acute pharyngitis, unspecified: Secondary | ICD-10-CM | POA: Diagnosis not present

## 2020-09-16 DIAGNOSIS — E063 Autoimmune thyroiditis: Secondary | ICD-10-CM | POA: Diagnosis not present

## 2020-09-20 ENCOUNTER — Telehealth: Payer: Self-pay

## 2020-09-20 DIAGNOSIS — I255 Ischemic cardiomyopathy: Secondary | ICD-10-CM

## 2020-10-03 ENCOUNTER — Encounter: Payer: Self-pay | Admitting: *Deleted

## 2020-10-03 NOTE — Telephone Encounter (Signed)
Assisted the patient with picking a date for ICD upgrade. Labs ordered and scheduled. Covid screening scheduled. Sonia Baller to meet with the patient on lab day and give instructions and scrub. He is agreeable and verbalized understanding.

## 2020-10-14 DIAGNOSIS — C44321 Squamous cell carcinoma of skin of nose: Secondary | ICD-10-CM | POA: Diagnosis not present

## 2020-10-14 DIAGNOSIS — C44329 Squamous cell carcinoma of skin of other parts of face: Secondary | ICD-10-CM | POA: Diagnosis not present

## 2020-10-14 DIAGNOSIS — C4441 Basal cell carcinoma of skin of scalp and neck: Secondary | ICD-10-CM | POA: Diagnosis not present

## 2020-10-14 DIAGNOSIS — L821 Other seborrheic keratosis: Secondary | ICD-10-CM | POA: Diagnosis not present

## 2020-10-14 DIAGNOSIS — L570 Actinic keratosis: Secondary | ICD-10-CM | POA: Diagnosis not present

## 2020-10-14 DIAGNOSIS — L578 Other skin changes due to chronic exposure to nonionizing radiation: Secondary | ICD-10-CM | POA: Diagnosis not present

## 2020-10-19 IMAGING — CR RIGHT ELBOW - COMPLETE 3+ VIEW
4 series · 4 of 4 positions shown · non-contrast
Comparison: None.

CLINICAL DATA: Pain status post fall

EXAM:
RIGHT ELBOW - COMPLETE 3+ VIEW

[x elbow ap right]
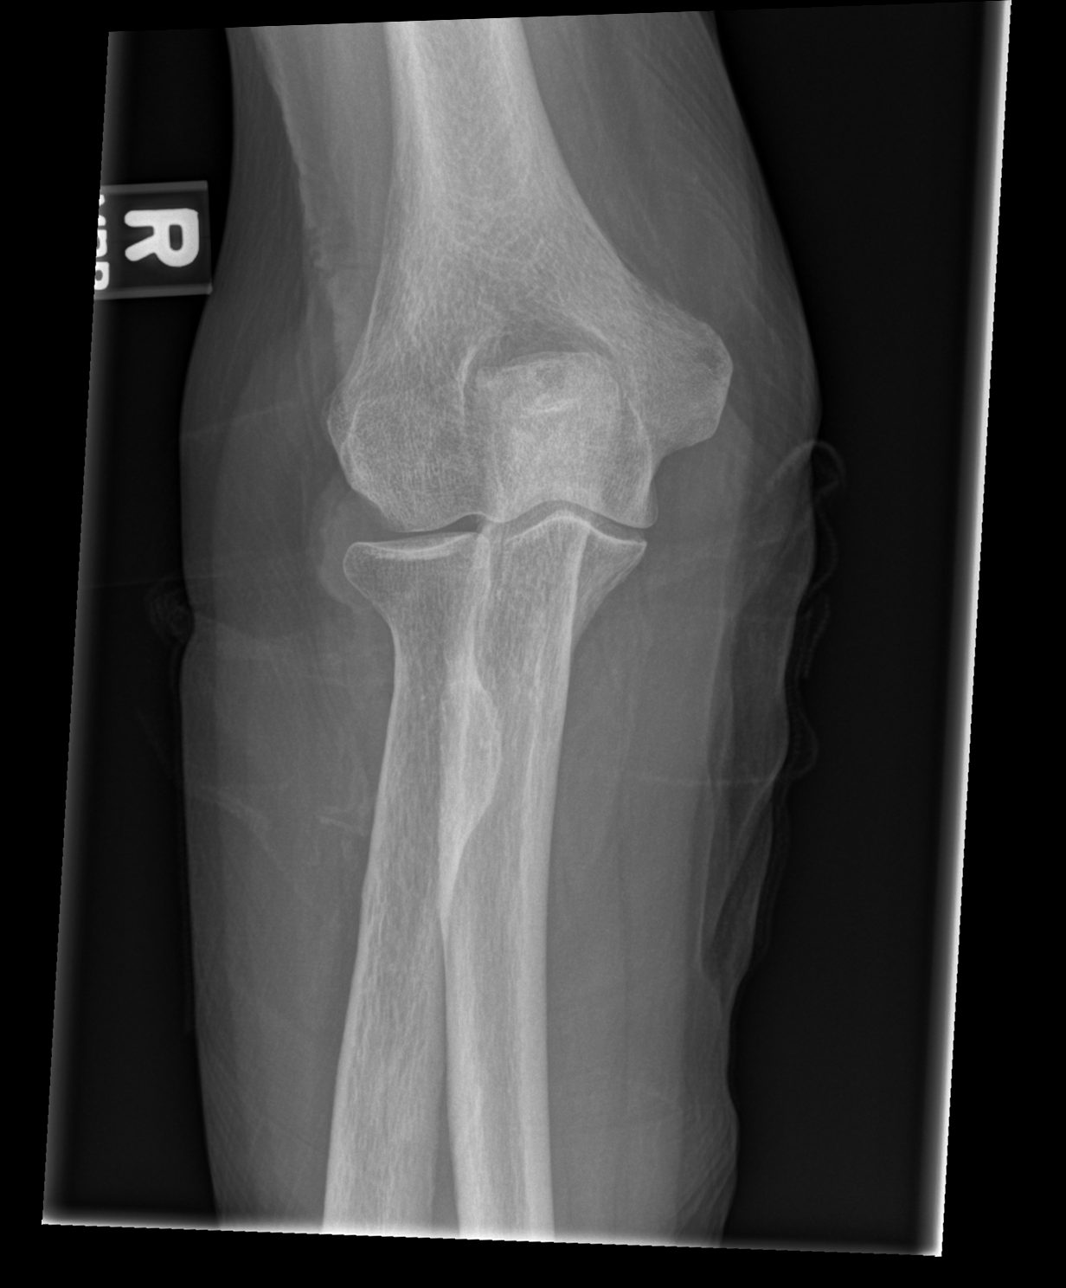

[x elbow obl right (1 of 2)]
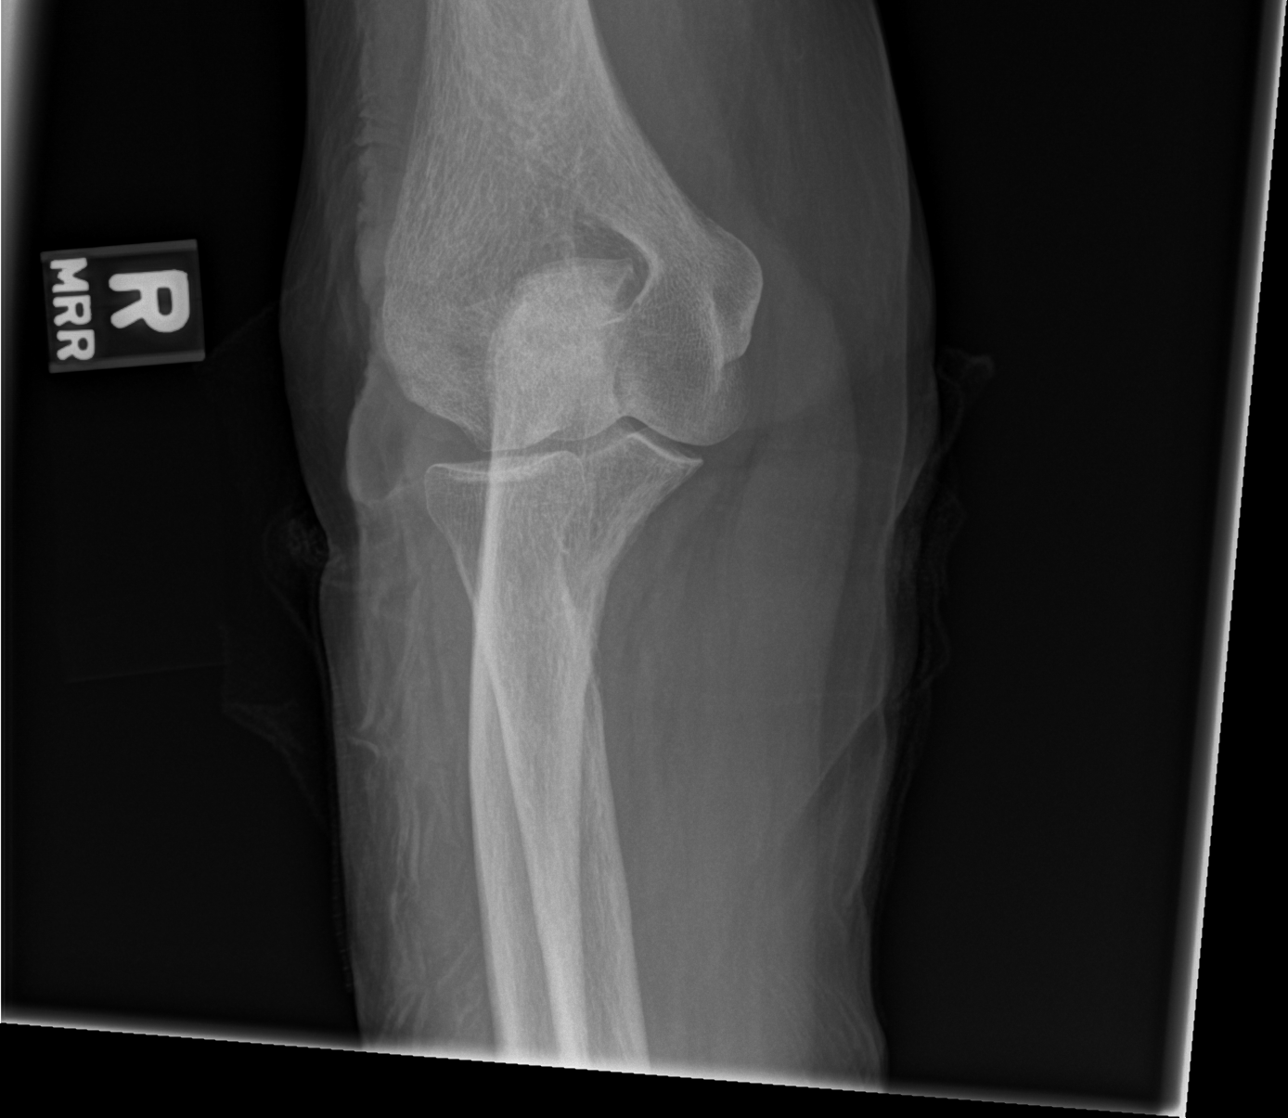

[x elbow obl right (2 of 2)]
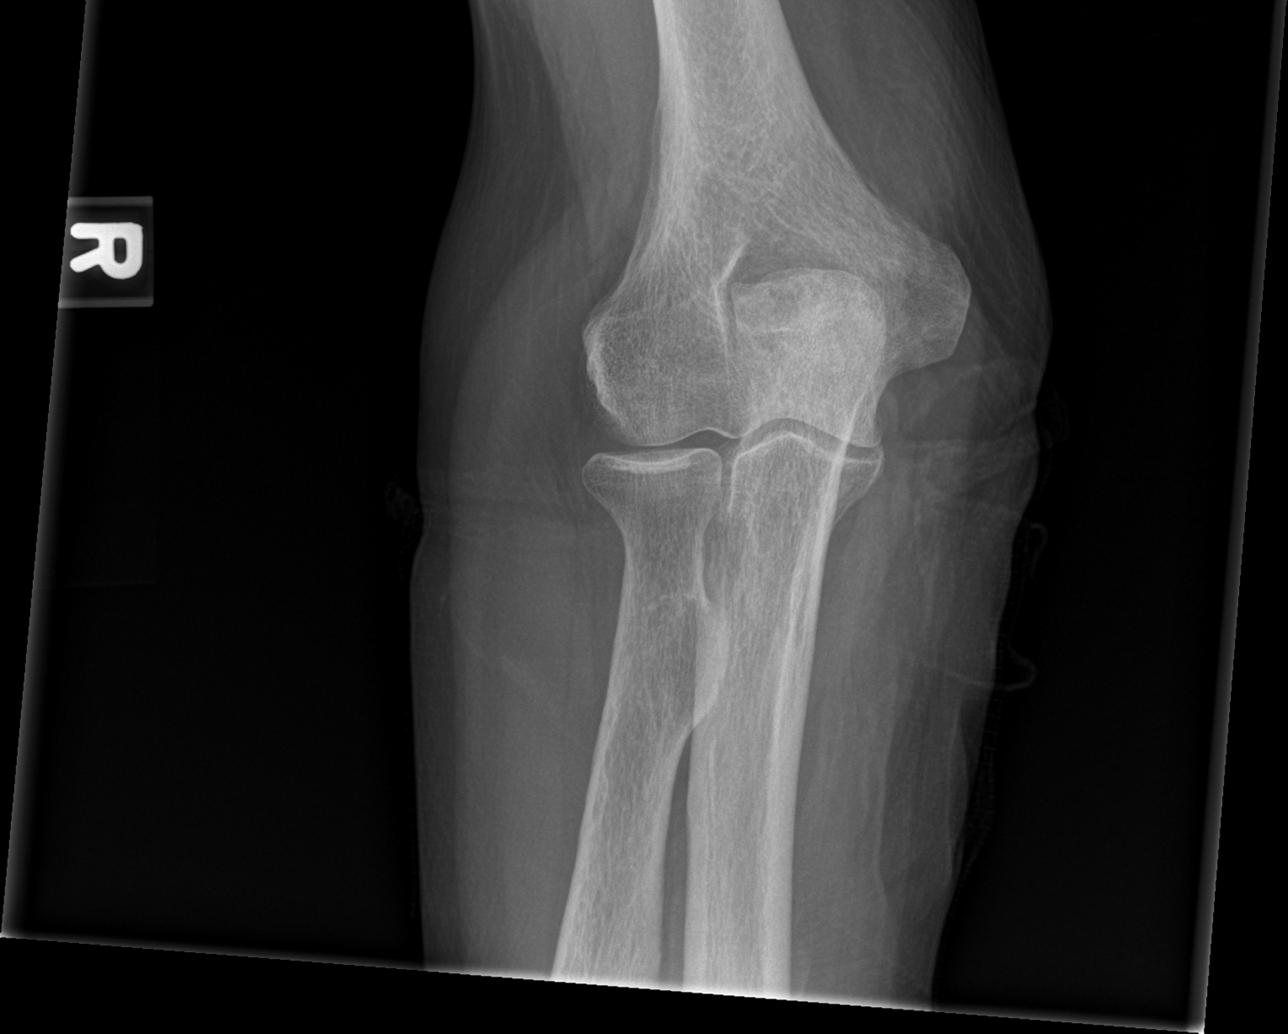

[x elbow lat right]
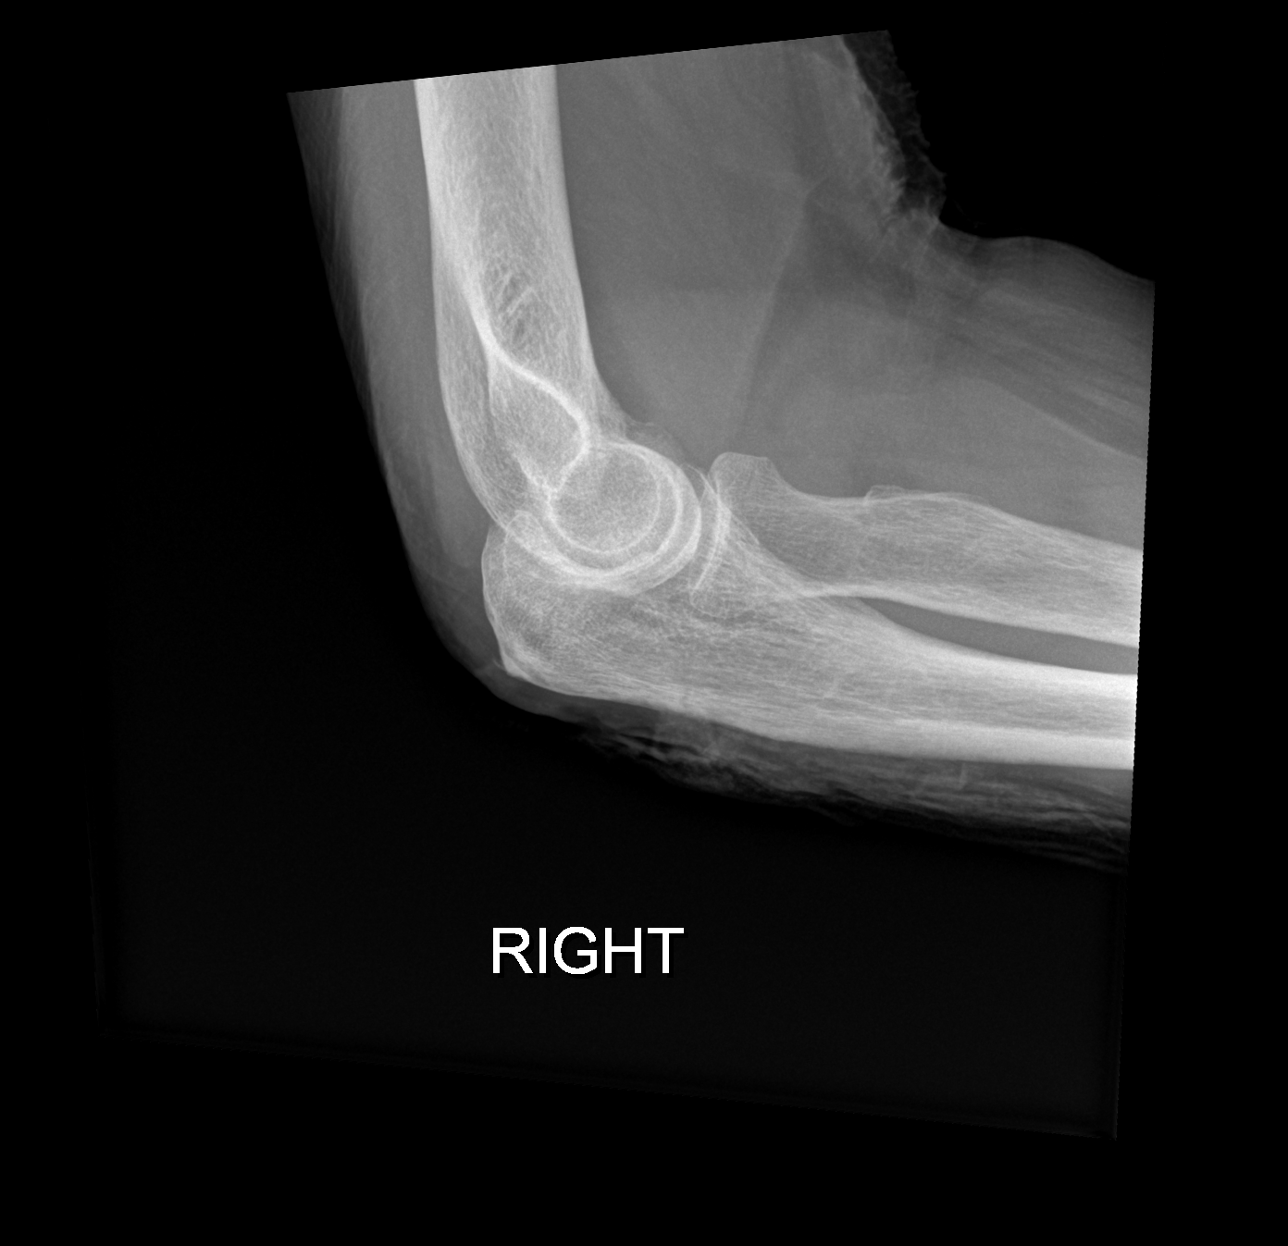

[4 of 4 positions shown; findings below may reference images not displayed]

FINDINGS: There is no evidence of fracture, dislocation, or joint effusion.
There is no evidence of arthropathy or other focal bone abnormality.
Soft tissues are unremarkable.
IMPRESSION: No displaced fracture.

## 2020-10-30 DIAGNOSIS — K219 Gastro-esophageal reflux disease without esophagitis: Secondary | ICD-10-CM | POA: Diagnosis not present

## 2020-10-30 DIAGNOSIS — Z23 Encounter for immunization: Secondary | ICD-10-CM | POA: Diagnosis not present

## 2020-10-30 DIAGNOSIS — E785 Hyperlipidemia, unspecified: Secondary | ICD-10-CM | POA: Diagnosis not present

## 2020-10-30 DIAGNOSIS — J449 Chronic obstructive pulmonary disease, unspecified: Secondary | ICD-10-CM | POA: Diagnosis not present

## 2020-10-30 DIAGNOSIS — Z139 Encounter for screening, unspecified: Secondary | ICD-10-CM | POA: Diagnosis not present

## 2020-10-30 DIAGNOSIS — E1169 Type 2 diabetes mellitus with other specified complication: Secondary | ICD-10-CM | POA: Diagnosis not present

## 2020-10-31 ENCOUNTER — Other Ambulatory Visit: Payer: Medicare Other

## 2020-10-31 DIAGNOSIS — C4441 Basal cell carcinoma of skin of scalp and neck: Secondary | ICD-10-CM | POA: Diagnosis not present

## 2020-10-31 DIAGNOSIS — C44311 Basal cell carcinoma of skin of nose: Secondary | ICD-10-CM | POA: Diagnosis not present

## 2020-11-01 ENCOUNTER — Telehealth: Payer: Self-pay | Admitting: Internal Medicine

## 2020-11-01 NOTE — Telephone Encounter (Signed)
    Mardene Celeste calling, she said pt told him that he got a missed call from [place] last week but wasn't sure what it is for. No notes on file. She said if there's anything important Sonia Baller can call her

## 2020-11-05 ENCOUNTER — Telehealth: Payer: Self-pay | Admitting: Internal Medicine

## 2020-11-05 NOTE — Telephone Encounter (Signed)
New Message:     Pt said he had some skin cancers removed last week and it is not completely healed. His question is, should he still have his lab work on Thursday or do it next week?

## 2020-11-06 ENCOUNTER — Other Ambulatory Visit: Payer: Medicare Other

## 2020-11-06 NOTE — Telephone Encounter (Signed)
Returned call to Pt.  Advised that wounds should not cause a problem with his lab work.  He will come June 10 to get lab work and meet with this nurse.

## 2020-11-08 ENCOUNTER — Other Ambulatory Visit: Payer: Self-pay

## 2020-11-08 ENCOUNTER — Other Ambulatory Visit: Payer: Medicare Other | Admitting: *Deleted

## 2020-11-08 DIAGNOSIS — I255 Ischemic cardiomyopathy: Secondary | ICD-10-CM

## 2020-11-08 LAB — CBC WITH DIFFERENTIAL/PLATELET
Basophils Absolute: 0.1 10*3/uL (ref 0.0–0.2)
Basos: 1 %
EOS (ABSOLUTE): 0.2 10*3/uL (ref 0.0–0.4)
Eos: 3 %
Hematocrit: 30.6 % — ABNORMAL LOW (ref 37.5–51.0)
Hemoglobin: 10.3 g/dL — ABNORMAL LOW (ref 13.0–17.7)
Immature Grans (Abs): 0 10*3/uL (ref 0.0–0.1)
Immature Granulocytes: 1 %
Lymphocytes Absolute: 1.8 10*3/uL (ref 0.7–3.1)
Lymphs: 29 %
MCH: 33.1 pg — ABNORMAL HIGH (ref 26.6–33.0)
MCHC: 33.7 g/dL (ref 31.5–35.7)
MCV: 98 fL — ABNORMAL HIGH (ref 79–97)
Monocytes Absolute: 0.4 10*3/uL (ref 0.1–0.9)
Monocytes: 6 %
Neutrophils Absolute: 3.8 10*3/uL (ref 1.4–7.0)
Neutrophils: 60 %
Platelets: 246 10*3/uL (ref 150–450)
RBC: 3.11 x10E6/uL — ABNORMAL LOW (ref 4.14–5.80)
RDW: 17.8 % — ABNORMAL HIGH (ref 11.6–15.4)
WBC: 6.3 10*3/uL (ref 3.4–10.8)

## 2020-11-08 LAB — BASIC METABOLIC PANEL
BUN/Creatinine Ratio: 21 (ref 10–24)
BUN: 27 mg/dL (ref 8–27)
CO2: 23 mmol/L (ref 20–29)
Calcium: 9.2 mg/dL (ref 8.6–10.2)
Chloride: 103 mmol/L (ref 96–106)
Creatinine, Ser: 1.3 mg/dL — ABNORMAL HIGH (ref 0.76–1.27)
Glucose: 97 mg/dL (ref 65–99)
Potassium: 4.5 mmol/L (ref 3.5–5.2)
Sodium: 139 mmol/L (ref 134–144)
eGFR: 54 mL/min/{1.73_m2} — ABNORMAL LOW (ref >59)

## 2020-11-12 ENCOUNTER — Ambulatory Visit (INDEPENDENT_AMBULATORY_CARE_PROVIDER_SITE_OTHER): Payer: Medicare Other

## 2020-11-12 DIAGNOSIS — I255 Ischemic cardiomyopathy: Secondary | ICD-10-CM

## 2020-11-12 LAB — CUP PACEART REMOTE DEVICE CHECK
Battery Remaining Longevity: 42 mo
Battery Remaining Percentage: 47 %
Brady Statistic RV Percent Paced: 0 %
Date Time Interrogation Session: 20220614085400
HighPow Impedance: 58 Ohm
Implantable Lead Implant Date: 20020206
Implantable Lead Implant Date: 20071005
Implantable Lead Location: 753860
Implantable Lead Location: 753860
Implantable Lead Model: 148
Implantable Lead Model: 4088
Implantable Lead Serial Number: 109512
Implantable Lead Serial Number: 224392
Implantable Pulse Generator Implant Date: 20120330
Lead Channel Impedance Value: 468 Ohm
Lead Channel Pacing Threshold Amplitude: 1.8 V
Lead Channel Pacing Threshold Pulse Width: 1 ms
Lead Channel Setting Pacing Amplitude: 3.5 V
Lead Channel Setting Pacing Pulse Width: 1 ms
Lead Channel Setting Sensing Sensitivity: 0.5 mV
Pulse Gen Serial Number: 114258

## 2020-11-15 ENCOUNTER — Telehealth: Payer: Self-pay | Admitting: Internal Medicine

## 2020-11-15 NOTE — Telephone Encounter (Signed)
New Message:      Pt says he is scheduled for a procedure next Friday. Please call, he said he might not be able to have it.

## 2020-11-15 NOTE — Telephone Encounter (Signed)
Pt states he has 2 places on his head from some Dermatology work he had done that are still open and healing.  Was told not to cover with a bandage and to just apply Vaseline.  Denies redness, drainage, or signs of infection.  Pt was worried that having opened wounds would prevent him from having his BIV Upgrade.  Advised pt that I do not think that will keep him from his procedure but I will send to Dr. Tanna Furry nurse for confirmation.  Pt aware she is out of the office and won't call back until next week.  Pt thanked me for the call.

## 2020-11-19 NOTE — Telephone Encounter (Signed)
Patient is following up, requesting advisement. 

## 2020-11-20 ENCOUNTER — Ambulatory Visit: Payer: Medicare Other | Admitting: Cardiology

## 2020-11-20 ENCOUNTER — Other Ambulatory Visit (HOSPITAL_COMMUNITY)
Admission: RE | Admit: 2020-11-20 | Discharge: 2020-11-20 | Disposition: A | Discharge status: Home / Self Care | Payer: Medicare Other | Source: Ambulatory Visit | Attending: Internal Medicine | Admitting: Internal Medicine

## 2020-11-20 DIAGNOSIS — Z01812 Encounter for preprocedural laboratory examination: Secondary | ICD-10-CM | POA: Diagnosis not present

## 2020-11-20 DIAGNOSIS — Z20822 Contact with and (suspected) exposure to covid-19: Secondary | ICD-10-CM | POA: Diagnosis not present

## 2020-11-20 LAB — SARS CORONAVIRUS 2 (TAT 6-24 HRS): SARS Coronavirus 2: NEGATIVE

## 2020-11-20 NOTE — Telephone Encounter (Signed)
Returned call to Pt, spoke with daughter.  Advised per Dr. Corliss Parish to proceed with procedure.  Daughter indicates understanding.

## 2020-11-21 ENCOUNTER — Telehealth: Payer: Self-pay

## 2020-11-21 NOTE — Pre-Procedure Instructions (Signed)
Attempted to call patient regarding procedure instructions for tomorrow.  No answer. 

## 2020-11-21 NOTE — Telephone Encounter (Signed)
Outreach made to Pt.  Requested he come earlier for his procedure.  Pt states he will try to get to [facility] by 6;00 am.  Procedure with cath lab moved to 7:30 am.  Dr. [name] aware.

## 2020-11-22 ENCOUNTER — Encounter (HOSPITAL_COMMUNITY): Payer: Self-pay | Admitting: Internal Medicine

## 2020-11-22 ENCOUNTER — Encounter (HOSPITAL_COMMUNITY)
Admission: RE | Disposition: A | Discharge status: Home / Self Care | Payer: Medicare Other | Source: Home / Self Care | Attending: Internal Medicine

## 2020-11-22 ENCOUNTER — Ambulatory Visit (HOSPITAL_COMMUNITY)
Admission: RE | Admit: 2020-11-22 | Discharge: 2020-11-23 | Disposition: A | Discharge status: Home / Self Care | Payer: Medicare Other | Attending: Internal Medicine | Admitting: Internal Medicine

## 2020-11-22 ENCOUNTER — Other Ambulatory Visit: Payer: Self-pay

## 2020-11-22 DIAGNOSIS — Z79899 Other long term (current) drug therapy: Secondary | ICD-10-CM | POA: Diagnosis not present

## 2020-11-22 DIAGNOSIS — E119 Type 2 diabetes mellitus without complications: Secondary | ICD-10-CM | POA: Insufficient documentation

## 2020-11-22 DIAGNOSIS — Z95810 Presence of automatic (implantable) cardiac defibrillator: Secondary | ICD-10-CM

## 2020-11-22 DIAGNOSIS — I5022 Chronic systolic (congestive) heart failure: Secondary | ICD-10-CM | POA: Diagnosis not present

## 2020-11-22 DIAGNOSIS — Z882 Allergy status to sulfonamides status: Secondary | ICD-10-CM | POA: Insufficient documentation

## 2020-11-22 DIAGNOSIS — Z87891 Personal history of nicotine dependence: Secondary | ICD-10-CM | POA: Diagnosis not present

## 2020-11-22 DIAGNOSIS — Z7902 Long term (current) use of antithrombotics/antiplatelets: Secondary | ICD-10-CM | POA: Insufficient documentation

## 2020-11-22 DIAGNOSIS — I447 Left bundle-branch block, unspecified: Secondary | ICD-10-CM | POA: Insufficient documentation

## 2020-11-22 DIAGNOSIS — Z79890 Hormone replacement therapy (postmenopausal): Secondary | ICD-10-CM | POA: Insufficient documentation

## 2020-11-22 DIAGNOSIS — Z7982 Long term (current) use of aspirin: Secondary | ICD-10-CM | POA: Diagnosis not present

## 2020-11-22 DIAGNOSIS — I2510 Atherosclerotic heart disease of native coronary artery without angina pectoris: Secondary | ICD-10-CM | POA: Diagnosis not present

## 2020-11-22 DIAGNOSIS — Z4502 Encounter for adjustment and management of automatic implantable cardiac defibrillator: Secondary | ICD-10-CM

## 2020-11-22 DIAGNOSIS — I110 Hypertensive heart disease with heart failure: Secondary | ICD-10-CM | POA: Diagnosis not present

## 2020-11-22 DIAGNOSIS — I255 Ischemic cardiomyopathy: Secondary | ICD-10-CM | POA: Diagnosis present

## 2020-11-22 DIAGNOSIS — E785 Hyperlipidemia, unspecified: Secondary | ICD-10-CM | POA: Insufficient documentation

## 2020-11-22 DIAGNOSIS — Z888 Allergy status to other drugs, medicaments and biological substances status: Secondary | ICD-10-CM | POA: Insufficient documentation

## 2020-11-22 DIAGNOSIS — I5020 Unspecified systolic (congestive) heart failure: Secondary | ICD-10-CM | POA: Diagnosis present

## 2020-11-22 DIAGNOSIS — Z950 Presence of cardiac pacemaker: Secondary | ICD-10-CM

## 2020-11-22 HISTORY — PX: BIV UPGRADE: EP1202

## 2020-11-22 LAB — GLUCOSE, CAPILLARY: Glucose-Capillary: 81 mg/dL (ref 70–99)

## 2020-11-22 SURGERY — BIV UPGRADE

## 2020-11-22 MED ORDER — SODIUM CHLORIDE 0.9 % IV SOLN
INTRAVENOUS | Status: AC
  Filled 2020-11-22: qty 2

## 2020-11-22 MED ORDER — ACETAMINOPHEN 325 MG PO TABS: 325.0000 mg | ORAL_TABLET | ORAL | Status: DC | PRN

## 2020-11-22 MED ORDER — METOPROLOL SUCCINATE ER 100 MG PO TB24
100.0000 mg | ORAL_TABLET | Freq: Every day | ORAL | Status: DC
  Administered 2020-11-22 – 2020-11-23 (×2): 100 mg via ORAL
  Filled 2020-11-22 (×2): qty 1

## 2020-11-22 MED ORDER — SODIUM CHLORIDE 0.9 % IV SOLN: INTRAVENOUS | Status: DC

## 2020-11-22 MED ORDER — MIDAZOLAM HCL 5 MG/5ML IJ SOLN
INTRAMUSCULAR | Status: AC
  Filled 2020-11-22: qty 5

## 2020-11-22 MED ORDER — IOHEXOL 350 MG/ML SOLN
INTRAVENOUS | Status: DC | PRN
  Administered 2020-11-22: 10 mL
  Administered 2020-11-22: 25 mL

## 2020-11-22 MED ORDER — HEPARIN (PORCINE) IN NACL 1000-0.9 UT/500ML-% IV SOLN
INTRAVENOUS | Status: DC | PRN
  Administered 2020-11-22: 500 mL

## 2020-11-22 MED ORDER — ALUM & MAG HYDROXIDE-SIMETH 200-200-20 MG/5ML PO SUSP
30.0000 mL | Freq: Once | ORAL | Status: AC
  Administered 2020-11-22: 30 mL via ORAL
  Filled 2020-11-22: qty 30

## 2020-11-22 MED ORDER — ACETAMINOPHEN 325 MG PO TABS
650.0000 mg | ORAL_TABLET | Freq: Three times a day (TID) | ORAL | Status: DC | PRN
  Administered 2020-11-22: 650 mg via ORAL
  Filled 2020-11-22: qty 2

## 2020-11-22 MED ORDER — MIDAZOLAM HCL 5 MG/5ML IJ SOLN
INTRAMUSCULAR | Status: DC | PRN
  Administered 2020-11-22 (×7): 1 mg via INTRAVENOUS

## 2020-11-22 MED ORDER — CEFAZOLIN SODIUM-DEXTROSE 2-4 GM/100ML-% IV SOLN
2.0000 g | INTRAVENOUS | Status: AC
  Administered 2020-11-22: 2 g via INTRAVENOUS

## 2020-11-22 MED ORDER — LIDOCAINE HCL (PF) 1 % IJ SOLN
INTRAMUSCULAR | Status: AC
  Filled 2020-11-22: qty 60

## 2020-11-22 MED ORDER — HEPARIN (PORCINE) IN NACL 1000-0.9 UT/500ML-% IV SOLN
INTRAVENOUS | Status: AC
  Filled 2020-11-22: qty 500

## 2020-11-22 MED ORDER — SODIUM CHLORIDE 0.9 % IV SOLN
INTRAVENOUS | Status: AC | PRN
  Administered 2020-11-22: 250 mL via INTRAVENOUS

## 2020-11-22 MED ORDER — LIDOCAINE HCL (PF) 1 % IJ SOLN
INTRAMUSCULAR | Status: DC | PRN
  Administered 2020-11-22: 30 mL

## 2020-11-22 MED ORDER — ALUM & MAG HYDROXIDE-SIMETH 200-200-20 MG/5ML PO SUSP
15.0000 mL | Freq: Four times a day (QID) | ORAL | Status: DC | PRN
  Administered 2020-11-22: 15 mL via ORAL
  Filled 2020-11-22: qty 30

## 2020-11-22 MED ORDER — ONDANSETRON HCL 4 MG/2ML IJ SOLN: 4.0000 mg | Freq: Four times a day (QID) | INTRAMUSCULAR | Status: DC | PRN

## 2020-11-22 MED ORDER — MIRTAZAPINE 30 MG PO TABS
30.0000 mg | ORAL_TABLET | Freq: Every day | ORAL | Status: DC
  Administered 2020-11-22: 30 mg via ORAL
  Filled 2020-11-22: qty 1

## 2020-11-22 MED ORDER — CEFAZOLIN SODIUM-DEXTROSE 1-4 GM/50ML-% IV SOLN
1.0000 g | Freq: Four times a day (QID) | INTRAVENOUS | Status: AC
  Administered 2020-11-22 – 2020-11-23 (×3): 1 g via INTRAVENOUS
  Filled 2020-11-22 (×3): qty 50

## 2020-11-22 MED ORDER — POVIDONE-IODINE 10 % EX SWAB
2.0000 "application " | Freq: Once | CUTANEOUS | Status: AC
  Administered 2020-11-22: 2 via TOPICAL

## 2020-11-22 MED ORDER — SODIUM CHLORIDE 0.9 % IV SOLN
80.0000 mg | INTRAVENOUS | Status: AC
  Administered 2020-11-22: 160 mg
  Filled 2020-11-22: qty 2

## 2020-11-22 MED ORDER — FENTANYL CITRATE (PF) 100 MCG/2ML IJ SOLN
INTRAMUSCULAR | Status: DC | PRN
  Administered 2020-11-22 (×7): 12.5 ug via INTRAVENOUS

## 2020-11-22 MED ORDER — CEFAZOLIN SODIUM-DEXTROSE 2-4 GM/100ML-% IV SOLN
INTRAVENOUS | Status: AC
  Filled 2020-11-22: qty 100

## 2020-11-22 MED ORDER — CHLORHEXIDINE GLUCONATE 4 % EX LIQD: 4.0000 "application " | Freq: Once | CUTANEOUS | Status: DC

## 2020-11-22 MED ORDER — LOSARTAN POTASSIUM 25 MG PO TABS
25.0000 mg | ORAL_TABLET | Freq: Every day | ORAL | Status: DC
  Administered 2020-11-22 – 2020-11-23 (×2): 25 mg via ORAL
  Filled 2020-11-22 (×2): qty 1

## 2020-11-22 MED ORDER — LEVOTHYROXINE SODIUM 100 MCG PO TABS
200.0000 ug | ORAL_TABLET | Freq: Every day | ORAL | Status: DC
  Administered 2020-11-23: 200 ug via ORAL
  Filled 2020-11-22: qty 2

## 2020-11-22 MED ORDER — FUROSEMIDE 20 MG PO TABS
20.0000 mg | ORAL_TABLET | Freq: Every day | ORAL | Status: DC
  Administered 2020-11-22 – 2020-11-23 (×2): 20 mg via ORAL
  Filled 2020-11-22 (×2): qty 1

## 2020-11-22 MED ORDER — FENTANYL CITRATE (PF) 100 MCG/2ML IJ SOLN
INTRAMUSCULAR | Status: AC
  Filled 2020-11-22: qty 2

## 2020-11-22 SURGICAL SUPPLY — 16 items
BALLN CATH 6FR 90 (CATHETERS) ×2
CABLE SURGICAL S-101-97-12 (CABLE) ×2 IMPLANT
CATH ACUITYPRO 45CM EH 9F (CATHETERS) ×1 IMPLANT
CATH BALLOON 6FR 90 2LUMEN (CATHETERS) IMPLANT
CATH JOSEPH QUAD ALLRED 6F REP (CATHETERS) ×1 IMPLANT
GUIDEWIRE ANGLED .035X150CM (WIRE) ×2 IMPLANT
ICD MOMENTUM X4 CRT-D G138 (ICD Generator) ×1 IMPLANT
LEAD ACUITY X4 4677 (Lead) ×1 IMPLANT
LEAD INGEVITY 7841 52 (Lead) ×1 IMPLANT
PAD PRO RADIOLUCENT 2001M-C (PAD) ×2 IMPLANT
POUCH AIGIS-R ANTIBACT ICD (Mesh General) ×2 IMPLANT
POUCH AIGIS-R ANTIBACT ICD LRG (Mesh General) IMPLANT
SHEATH 7FR PRELUDE SNAP 13 (SHEATH) ×1 IMPLANT
SHEATH 9.5FR PRELUDE SNAP 13 (SHEATH) ×1 IMPLANT
TRAY PACEMAKER INSERTION (PACKS) ×2 IMPLANT
WIRE ACUITY WHISPER EDS 4648 (WIRE) ×1 IMPLANT

## 2020-11-22 NOTE — Discharge Instructions (Addendum)
 **  DO NOT TAKE Clopidogrel (Plavix) until next Saturday, November 30, 2020**   Supplemental Discharge Instructions for  Pacemaker/Defibrillator Patients   Activity No heavy lifting or vigorous activity with your left arm for 6 to 8 weeks.  Do not raise your left arm above your head for one week.  Gradually raise your affected arm as drawn below.             11/27/20                   11/28/20                    11/29/20                    11/30/20 __  NO DRIVING for  1 week   ; you may begin driving on   07/09/74  .  WOUND CARE Keep the wound area clean and dry.  Do not get this area wet , no showers util cleared to at your wound check visit. *Leave the pressure dressing in place.  You will see the nurse on Tuesday, June 28 to have it removed.* The tape/steri-strips on your wound will fall off; do not pull them off.  No bandage is needed on the site.  DO  NOT apply any creams, oils, or ointments to the wound area. If you notice any drainage or discharge from the wound, any swelling or bruising at the site, or you develop a fever > 101? F after you are discharged home, call the office at once.  Special Instructions You are still able to use cellular telephones; use the ear opposite the side where you have your pacemaker/defibrillator.  Avoid carrying your cellular phone near your device. When traveling through airports, show security personnel your identification card to avoid being screened in the metal detectors.  Ask the security personnel to use the hand wand. Avoid arc welding equipment, MRI testing (magnetic resonance imaging), TENS units (transcutaneous nerve stimulators).  Call the office for questions about other devices. Avoid electrical appliances that are in poor condition or are not properly grounded. Microwave ovens are safe to be near or to operate.  Additional information for defibrillator patients should your device go off: If your device goes off ONCE and you feel fine afterward,  notify the device clinic nurses. If your device goes off ONCE and you do not feel well afterward, call 911. If your device goes off TWICE, call 911. If your device goes off THREE times in one day, call 911.  DO NOT DRIVE YOURSELF OR A FAMILY MEMBER WITH A DEFIBRILLATOR TO THE HOSPITAL--CALL 911.

## 2020-11-22 NOTE — H&P (Addendum)
HPI [name] returns today for followup. He is a pleasant 87 yo man with a h/o CAD, chronic systolic heart failure who underwent ICD insertion almost 20 years ago. He had 2 ICD shock with his first device, none since. He has developed worsening CHF. Attempts at uptitration of medical therapy with entresto resulted in hypotension. He has been on a higher dose of cozaar but he still has class 3 symptoms. He denies syncope. No edema. Main symptom is sob with exertion. He has LBBB and a QRS duration of 160.      Allergies  Allergen Reactions   Prednisone Rash   Sulfa Antibiotics Other (See Comments)      Hyper               Current Outpatient Medications  Medication Sig Dispense Refill   ACCU-CHEK AVIVA PLUS test strip         aspirin 81 MG tablet Take 81 mg by mouth daily.       atorvastatin (LIPITOR) 40 MG tablet Take 40 mg by mouth Daily.       clopidogrel (PLAVIX) 75 MG tablet TAKE 1 TABLET BY MOUTH EVERY DAY 90 tablet 3   cyanocobalamin 1000 MCG tablet Take 1,000 mcg by mouth daily.       FLOVENT HFA 110 MCG/ACT inhaler Inhale 2 puffs into the lungs 2 (two) times daily.   3   fluticasone (FLONASE) 50 MCG/ACT nasal spray Place 2 sprays into the nose daily.       furosemide (LASIX) 20 MG tablet Take 1 tablet (20 mg total) by mouth daily. 90 tablet 3   losartan (COZAAR) 25 MG tablet Take 1 tablet (25 mg total) by mouth in the morning and at bedtime. 180 tablet 3   metoprolol (TOPROL-XL) 200 MG 24 hr tablet Take 100 mg by mouth daily.       mirtazapine (REMERON) 30 MG tablet Take 30 mg by mouth at bedtime.       montelukast (SINGULAIR) 10 MG tablet Take 10 mg by mouth at bedtime.   12   pantoprazole (PROTONIX) 40 MG tablet Take 40 mg by mouth daily.       Polyethyl Glycol-Propyl Glycol (SYSTANE OP) Place 1 drop into both eyes daily as needed (dry eyes).       SYNTHROID 200 MCG tablet Take 200 mcg by mouth daily.        No current facility-administered medications for  this visit.            Past Medical History:  Diagnosis Date   Atrial flutter ([place])      (I could not find documentation of this rhythm.)   AUTOMATIC IMPLANTABLE CARDIAC DEFIBRILLATOR SITU     Automatic implantable cardioverter-defibrillator in situ 12/05/2009    Qualifier: Diagnosis of  By: [name], MD, [facility], Binnie Kand   CAD (coronary artery disease) 05/22/2011   Carotid stenosis 05/22/2011   CHF CONGESTIVE HEART FAILURE      45% by echo 2015   COPD 12/01/2007    Qualifier: Diagnosis of  By: [name] NP, [name]     DM2 (diabetes mellitus, type 2) ([place])     DYSLIPIDEMIA     DYSPNEA     Edema     Essential hypertension 11/17/2007    Qualifier: Diagnosis of  By: [name]     Gout     HYPERTENSION     HYPOTHYROIDISM     MYOCARDIAL  INFARCTION      MI 1999, stent x 2 to RCA, 70% circ, 30 and 40 % LADs   WEIGHT GAIN, ABNORMAL        ROS:    All systems reviewed and negative except as noted in the HPI.          Past Surgical History:  Procedure Laterality Date   ANGIOPLASTY        stent   [place]   LOWER EXTREMITY ANGIOGRAPHY N/A 05/04/2018    Procedure: LOWER EXTREMITY ANGIOGRAPHY;  Surgeon: [name], MD;  Location: [place] [facility];  Service: Cardiovascular;  Laterality: N/A;   PERIPHERAL VASCULAR INTERVENTION Bilateral 05/04/2018    Procedure: PERIPHERAL VASCULAR INTERVENTION;  Surgeon: [name], MD;  Location: [place] [facility];  Service: Cardiovascular;  Laterality: Bilateral;  [place]             Family History  Problem Relation Age of Onset   Other Mother          natural causes   Kidney disease Father     Cancer Brother          throat cancer        Social History         Socioeconomic History   Marital status: Married      Spouse name: Not on file   Number of children: Not on file   Years of education: Not on file   Highest  education level: Not on file  Occupational History   Not on file  Tobacco Use   Smoking status: Former Smoker      Quit date: 12/10/1983      Years since quitting: 36.7   Smokeless tobacco: Never Used   Tobacco comment: quit 1985  Substance and Sexual Activity   Alcohol use: No   Drug use: No   Sexual activity: Not on file  Other Topics Concern   Not on file  Social History Narrative   Not on file    Social Determinants of Health    Financial Resource Strain: Not on file  Food Insecurity: Not on file  Transportation Needs: Not on file  Physical Activity: Not on file  Stress: Not on file  Social Connections: Not on file  Intimate Partner Violence: Not on file        BP (!) 132/56   Pulse (!) 56   Ht 5\' 9"  (1.753 m)   Wt 166 lb 12.8 oz (75.7 kg)   SpO2 95%   BMI 24.63 kg/m    Physical Exam:   Well appearing NAD HEENT: Unremarkable Neck:  No JVD, no thyromegally Lymphatics:  No adenopathy Back:  No CVA tenderness Lungs:  Clear with no wheezes HEART:  Regular rate rhythm, no murmurs, no rubs, no clicks Abd:  soft, positive bowel sounds, no organomegally, no rebound, no guarding Ext:  2 plus pulses, no edema, no cyanosis, no clubbing Skin:  No rashes no nodules Neuro:  CN II through XII intact, motor grossly intact   EKG - nsr with LBBB   DEVICE  Normal device function.  See PaceArt for details.   Assess/Plan: 1. ICD - his device has about 4 years of battery longevity. His threshold is stable. 2. Chronic systolic heart failure - his symptoms are probably class 3. I will discuss  upgrade to a Biv ICD with Dr. Lenore Cordia. 3. CAD - he denies anginal symptoms. He has severe LV dysfunction. 4. Dyslipidemia - he will continue lipitor.   Carleene Overlie Leahna Hewson,MD

## 2020-11-22 NOTE — Progress Notes (Signed)
Mobility Specialist: Progress Note   11/22/20 1444  Mobility  Activity Ambulated in hall  Level of Assistance Minimal assist, patient does 75% or more  Assistive Device None  Distance Ambulated (ft) 210 ft  Mobility Ambulated with assistance in hallway  Mobility Response Tolerated well  Mobility performed by Mobility specialist  $Mobility charge 1 Mobility   Pre-Mobility: 102 HR, 100% SpO2 During Mobility: 127 HR Post-Mobility: 120 HR, 111/56 BP, 100% SpO2  Pt required minA to sit EOB from supine as well as to stand. Pt to BR and then agreeable to ambulate. Pt required contact guard while walking using gait belt, c/o SOB halfway through ambulation but otherwise asx. Pt back in bed with call bell at his side and pt's wife present in the room.   [facility] Casimira Sutphin Mobility Specialist Mobility Specialist Phone: [phone]

## 2020-11-22 NOTE — Progress Notes (Signed)
C/o indigestion this morning, Anderson Malta rn cath lab informed will try to get an order.

## 2020-11-23 ENCOUNTER — Ambulatory Visit (HOSPITAL_COMMUNITY): Payer: Medicare Other

## 2020-11-23 ENCOUNTER — Encounter (HOSPITAL_COMMUNITY): Payer: Self-pay | Admitting: Internal Medicine

## 2020-11-23 DIAGNOSIS — Z888 Allergy status to other drugs, medicaments and biological substances status: Secondary | ICD-10-CM | POA: Diagnosis not present

## 2020-11-23 DIAGNOSIS — I10 Essential (primary) hypertension: Secondary | ICD-10-CM | POA: Diagnosis not present

## 2020-11-23 DIAGNOSIS — E119 Type 2 diabetes mellitus without complications: Secondary | ICD-10-CM | POA: Diagnosis not present

## 2020-11-23 DIAGNOSIS — Z4502 Encounter for adjustment and management of automatic implantable cardiac defibrillator: Secondary | ICD-10-CM | POA: Diagnosis not present

## 2020-11-23 DIAGNOSIS — Z7982 Long term (current) use of aspirin: Secondary | ICD-10-CM | POA: Diagnosis not present

## 2020-11-23 DIAGNOSIS — I5022 Chronic systolic (congestive) heart failure: Secondary | ICD-10-CM | POA: Diagnosis not present

## 2020-11-23 DIAGNOSIS — J90 Pleural effusion, not elsewhere classified: Secondary | ICD-10-CM | POA: Diagnosis not present

## 2020-11-23 DIAGNOSIS — Z87891 Personal history of nicotine dependence: Secondary | ICD-10-CM | POA: Diagnosis not present

## 2020-11-23 DIAGNOSIS — I2510 Atherosclerotic heart disease of native coronary artery without angina pectoris: Secondary | ICD-10-CM | POA: Diagnosis not present

## 2020-11-23 DIAGNOSIS — I447 Left bundle-branch block, unspecified: Secondary | ICD-10-CM | POA: Diagnosis not present

## 2020-11-23 DIAGNOSIS — Z7902 Long term (current) use of antithrombotics/antiplatelets: Secondary | ICD-10-CM | POA: Diagnosis not present

## 2020-11-23 DIAGNOSIS — Z882 Allergy status to sulfonamides status: Secondary | ICD-10-CM | POA: Diagnosis not present

## 2020-11-23 DIAGNOSIS — Z79899 Other long term (current) drug therapy: Secondary | ICD-10-CM | POA: Diagnosis not present

## 2020-11-23 DIAGNOSIS — E785 Hyperlipidemia, unspecified: Secondary | ICD-10-CM | POA: Diagnosis not present

## 2020-11-23 DIAGNOSIS — I110 Hypertensive heart disease with heart failure: Secondary | ICD-10-CM | POA: Diagnosis not present

## 2020-11-23 DIAGNOSIS — Z950 Presence of cardiac pacemaker: Secondary | ICD-10-CM

## 2020-11-23 DIAGNOSIS — Z95810 Presence of automatic (implantable) cardiac defibrillator: Secondary | ICD-10-CM | POA: Diagnosis not present

## 2020-11-23 DIAGNOSIS — I255 Ischemic cardiomyopathy: Secondary | ICD-10-CM | POA: Diagnosis not present

## 2020-11-23 DIAGNOSIS — J811 Chronic pulmonary edema: Secondary | ICD-10-CM | POA: Diagnosis not present

## 2020-11-23 HISTORY — DX: Presence of cardiac pacemaker: Z95.0

## 2020-11-23 LAB — GLUCOSE, CAPILLARY: Glucose-Capillary: 103 mg/dL — ABNORMAL HIGH (ref 70–99)

## 2020-11-23 MED ORDER — CLOPIDOGREL BISULFATE 75 MG PO TABS: 75.0000 mg | ORAL_TABLET | Freq: Every morning | ORAL | Status: DC

## 2020-11-23 MED ORDER — FUROSEMIDE 20 MG PO TABS: 20.0000 mg | ORAL_TABLET | Freq: Every morning | ORAL | Status: DC

## 2020-11-23 NOTE — Discharge Summary (Addendum)
ELECTROPHYSIOLOGY PROCEDURE DISCHARGE SUMMARY    Patient ID: [name],  MRN: [id number], DOB/AGE: September 20, 1933 87 y.o.  Admit date: 11/22/2020 Discharge date: 11/23/2020  Primary Care Physician: Leonides Sake, MD  Primary Cardiologist: [name], MD  Electrophysiologist: Cristopher Peru, MD    Discharge Diagnosis:  Principal Problem:   HFrEF (heart failure with reduced ejection fraction) ([place]) Active Problems:   Ischemic cardiomyopathy   S/P updgrade to CRT-D   CAD (coronary artery disease)   Allergies  Allergen Reactions   Prednisone Rash   Sulfa Antibiotics Other (See Comments)    Hyper     Procedures This Admission:  1.  Implantation of a [employer] CRT-D (serial [id number]) on 11/22/20 by Dr. [name].  The patient received a [employer] 52 cm active-fixation atrial pacing lead, serial [id number] right atrial lead and [employer] quadripolar LV pacing lead, serial [id number] left ventricular lead.    2.  CXR on 11/23/2020  demonstrated no pneumothorax status post device implantation.   Brief HPI: [name] is a 87 y.o. male has a hx of coronary artery disease, ischemic cardiomyopathy, (HFrEF) heart failure with reduced ejection fraction with EF 25-30, VT s/p ICD, hypertension, hyperlipidemia, diabetes mellitus, LBBB.  He was referred back to Dr. [name] in 08/2020 for consideration of upgrade to CRT-D due to NYHA III symptoms and the inability to titrate GDMT due to hypotension.    Hospital Course:  The patient was admitted and underwent implantation of a [employer] CRT-D with details as outlined above.  He was monitored on telemetry overnight which demonstrated V paced rhythm.  He had a pressure dressing applied to his L chest.  The device was interrogated and found to be functioning normally.  CXR was obtained and demonstrated no pneumothorax status post device implantation.  Wound care, arm mobility, and restrictions were reviewed with the  patient.  The patient was examined by Dr. [name] this morning and considered stable for discharge to home.  He will need to hold Plavix for 1 week and keep his pressure dressing on until seen in the [facility] in 3 days.  Regarding blood thinner therapy, they were instructed to resume their Plavix (medication name) on November 30, 2020 (date/time).   Discharge VS: BP 96/74 (BP Location: Right Arm)   Pulse 87   Temp 98 F (36.7 C) (Oral)   Resp 20   Ht 5\' 8"  (1.727 m)   Wt 76 kg   SpO2 96%   BMI 25.48 kg/m    Labs: N/a   Discharge Medications:  Allergies as of 11/23/2020       Reactions   Prednisone Rash   Sulfa Antibiotics Other (See Comments)   Hyper         Medication List     TAKE these medications    Accu-Chek Aviva Plus test strip Generic drug: glucose blood   acetaminophen 650 MG CR tablet Commonly known as: TYLENOL Take 650 mg by mouth every 8 (eight) hours as needed for pain.   aspirin EC 81 MG tablet Take 81 mg by mouth in the morning. Swallow whole.   atorvastatin 40 MG tablet Commonly known as: LIPITOR Take 40 mg by mouth in the morning.   clopidogrel 75 MG tablet Commonly known as: PLAVIX Take 1 tablet (75 mg total) by mouth in the morning. Start taking on: November 30, 2020 What changed:  when to take this These instructions start on November 30, 2020. If you are  unsure what to do until then, ask your doctor or other care provider.   Flovent HFA 110 MCG/ACT inhaler Generic drug: fluticasone Inhale 2 puffs into the lungs 2 (two) times daily.   fluticasone 50 MCG/ACT nasal spray Commonly known as: FLONASE Place 2 sprays into the nose in the morning.   furosemide 20 MG tablet Commonly known as: LASIX Take 1 tablet (20 mg total) by mouth in the morning.   ipratropium-albuterol 0.5-2.5 (3) MG/3ML Soln Commonly known as: DUONEB Take 3 mLs by nebulization every 6 (six) hours as needed (wheezing/shortness of breath).   losartan 25 MG tablet Commonly  known as: COZAAR Take 1 tablet (25 mg total) by mouth in the morning and at bedtime.   metoprolol 200 MG 24 hr tablet Commonly known as: TOPROL-XL Take 100 mg by mouth in the morning and at bedtime.   mirtazapine 30 MG tablet Commonly known as: REMERON Take 30 mg by mouth at bedtime.   montelukast 10 MG tablet Commonly known as: SINGULAIR Take 10 mg by mouth daily.   pantoprazole 40 MG tablet Commonly known as: PROTONIX Take 40 mg by mouth 2 (two) times daily. Morning & Evening   Synthroid 200 MCG tablet Generic drug: levothyroxine Take 200 mcg by mouth in the morning.   SYSTANE OP Place 1 drop into both eyes in the morning and at bedtime.   vitamin B-12 1000 MCG tablet Commonly known as: CYANOCOBALAMIN Take 1,000 mcg by mouth in the morning.               Discharge Care Instructions  (From admission, onward)           Start     Ordered   11/23/20 0000  Discharge wound care:       Comments: Keep pressure dressing over the left chest wound.  We will arrange follow up with the device clinic on Tuesday 6/28.  The nurse will remove it at this visit.   11/23/20 1042            Disposition:  Discharge Instructions     (HEART FAILURE PATIENTS) Call MD:  Anytime you have any of the following symptoms: 1) 3 pound weight gain in 24 hours or 5 pounds in 1 week 2) shortness of breath, with or without a dry hacking cough 3) swelling in the hands, feet or stomach 4) if you have to sleep on extra pillows at night in order to breathe.   Complete by: As directed    Diet - low sodium heart healthy   Complete by: As directed    Discharge wound care:   Complete by: As directed    Keep pressure dressing over the left chest wound.  We will arrange follow up with the device clinic on Tuesday 6/28.  The nurse will remove it at this visit.   Increase activity slowly   Complete by: As directed    See Patient Instruction sheet   Other Restrictions   Complete by: As  directed    See Patient Instruction sheet       Follow-up Information     [facility] Follow up.   Specialty: Cardiology Why: 12/03/20 @ 12:00PM (noon), wound check visit Contact information: [address] [place]        Evans Lance, MD Follow up.   Specialty: Cardiology Why: 03/04/21 @ 3:45PM Contact information: [address] [place] [address] [phone]  [facility] Follow up on 11/26/2020.   Specialty: Cardiology Why: The office will call to arrange a nurse visit to remove the pressure dressing on Tuesday June 28. Contact information: [address], [place] [phone]                Duration of Discharge Encounter: Greater than 30 minutes including physician time.  Signed, Richardson Dopp, PA-C  11/23/2020 10:55 AM   EP Attending  Patient seen and examined. Agree with above. He is doing well after Biv ICD upgrade with pocket relocation to a sub-pectoral location. The patient is a little sore. He has no blood on his pressure dressing. He had been on plavix. His new leads are in place and working normally. He is maintaining NSR. ICD Interrogation under my direction demonstrates normal biv ICD function. He will be discharged home with usual followup except he will return to our office next Tuesday to have his bandage removed. He will hold plavix until 11/30/20.  Jonathon Overlie Avyana Puffenbarger,MD

## 2020-11-25 ENCOUNTER — Encounter (HOSPITAL_COMMUNITY): Payer: Self-pay | Admitting: Internal Medicine

## 2020-11-25 NOTE — Telephone Encounter (Signed)
Called patient and device clinic apt made 11/26/20 @ 2:15. Location, date and time discussed with patient with verbal understanding.

## 2020-11-25 NOTE — Telephone Encounter (Signed)
-----   Message from Liliane Shi, [place] sent at 11/23/2020 10:48 AM EDT ----- Regarding: Needs Device Clinic appt 11/26/20 [name]  MRN - [id number]  Age/DOB - 87 y.o. [birth year]   EP:  [name] Cardiologist: [place] 11/23/2020 after upgrade from ICD to CRT-D.  He already has his 10 day and 3 mos appointments scheduled.  BUT, he has a pressure dressing on his wound and Dr. [name] wants him to be seen in the device clinic on Tuesday, June 28 to have this removed.    Please schedule nurse visit with the device clinic on Tuesday November 26, 2020.  Richardson Dopp, PA-C    11/23/2020 10:51 AM

## 2020-11-26 ENCOUNTER — Ambulatory Visit (INDEPENDENT_AMBULATORY_CARE_PROVIDER_SITE_OTHER): Payer: Medicare Other

## 2020-11-26 ENCOUNTER — Other Ambulatory Visit: Payer: Self-pay

## 2020-11-26 DIAGNOSIS — I255 Ischemic cardiomyopathy: Secondary | ICD-10-CM | POA: Diagnosis not present

## 2020-11-26 NOTE — Patient Instructions (Addendum)
Do not take your plavix for the next 6 days. START your plavix Monday 12/02/2020. Please call if you have any increased swelling, redness, warmth, fever or chills.  [facility] [phone]

## 2020-11-26 NOTE — Progress Notes (Signed)
Patient seen in device clinic for removal of pressure dressing. Dr. [name] in to see patient. Patient advised to continue to monitor for increased swelling, any redness, drainage, warmth, fever or chills, and to call the device clinic if any arise. Per Dr. [name] patient advised NOT to take plavix for the next 6 days. He may start Plavix next Monday 12/02/20. Patient verbalized understanding. Patient gave permission to attach image to chart.

## 2020-12-03 ENCOUNTER — Ambulatory Visit (INDEPENDENT_AMBULATORY_CARE_PROVIDER_SITE_OTHER): Payer: Medicare Other

## 2020-12-03 ENCOUNTER — Other Ambulatory Visit: Payer: Self-pay

## 2020-12-03 DIAGNOSIS — I255 Ischemic cardiomyopathy: Secondary | ICD-10-CM

## 2020-12-03 LAB — CUP PACEART INCLINIC DEVICE CHECK
Brady Statistic RA Percent Paced: 1 % — CL
Brady Statistic RV Percent Paced: 19 %
Date Time Interrogation Session: 20220705123203
HighPow Impedance: 41 Ohm
Implantable Lead Implant Date: 20020206
Implantable Lead Implant Date: 20071005
Implantable Lead Implant Date: 20220624
Implantable Lead Implant Date: 20220624
Implantable Lead Location: 753858
Implantable Lead Location: 753859
Implantable Lead Location: 753860
Implantable Lead Location: 753860
Implantable Lead Model: 148
Implantable Lead Model: 4088
Implantable Lead Model: 4677
Implantable Lead Model: 7841
Implantable Lead Serial Number: 1073796
Implantable Lead Serial Number: 109512
Implantable Lead Serial Number: 224392
Implantable Lead Serial Number: 824442
Implantable Pulse Generator Implant Date: 20220624
Lead Channel Impedance Value: 462 Ohm
Lead Channel Impedance Value: 552 Ohm
Lead Channel Impedance Value: 805 Ohm
Lead Channel Pacing Threshold Amplitude: 0.7 V
Lead Channel Pacing Threshold Amplitude: 1.9 V
Lead Channel Pacing Threshold Amplitude: 2.6 V
Lead Channel Pacing Threshold Pulse Width: 0.4 ms
Lead Channel Pacing Threshold Pulse Width: 0.5 ms
Lead Channel Pacing Threshold Pulse Width: 1 ms
Lead Channel Sensing Intrinsic Amplitude: 13.7 mV
Lead Channel Sensing Intrinsic Amplitude: 22.7 mV
Lead Channel Sensing Intrinsic Amplitude: 3.3 mV
Lead Channel Setting Pacing Amplitude: 3.2 V
Lead Channel Setting Pacing Amplitude: 3.5 V
Lead Channel Setting Pacing Amplitude: 3.5 V
Lead Channel Setting Pacing Pulse Width: 0.5 ms
Lead Channel Setting Pacing Pulse Width: 1 ms
Lead Channel Setting Sensing Sensitivity: 0.5 mV
Lead Channel Setting Sensing Sensitivity: 1 mV
Pulse Gen Serial Number: 385552

## 2020-12-03 NOTE — Patient Instructions (Signed)
   After Your ICD (Implantable Cardiac Defibrillator)    Monitor your defibrillator site for redness, swelling, and drainage. Call the device clinic at [phone] if you experience these symptoms or fever/chills.  Your incision was closed with Steri-strips or staples:  You may shower 7 days after your procedure and wash your incision with soap and water. Avoid lotions, ointments, or perfumes over your incision until it is well-healed.  You may use a hot tub or a pool after your wound check appointment if the incision is completely closed.  Do not lift, push or pull greater than 10 pounds with the affected arm until 6 weeks after your procedure. There are no other restrictions in arm movement after your wound check appointment.  Your ICD is not MRI compatible.  Your ICD is designed to protect you from life threatening heart rhythms. Because of this, you may receive a shock.   1 shock with no symptoms:  Call the office during business hours. 1 shock with symptoms (chest pain, chest pressure, dizziness, lightheadedness, shortness of breath, overall feeling unwell):  Call 911. If you experience 2 or more shocks in 24 hours:  Call 911. If you receive a shock, you should not drive.  [place] DMV - no driving for 6 months if you receive appropriate therapy from your ICD.   ICD Alerts:  Some alerts are vibratory and others beep. These are NOT emergencies. Please call our office to let us know. If this occurs at night or on weekends, it can wait until the next business day. Send a remote transmission.  If your device is capable of reading fluid status (for heart failure), you will be offered monthly monitoring to review this with you.   Remote monitoring is used to monitor your ICD from home. This monitoring is scheduled every 91 days by our office. It allows us to keep an eye on the functioning of your device to ensure it is working properly. You will routinely see your Electrophysiologist annually  (more often if necessary).  

## 2020-12-03 NOTE — Progress Notes (Signed)
Wound check appointment. Steri-strips removed. Wound without redness or edema. Incision edges approximated, wound well healed. Normal device function. Thresholds, sensing, and impedances consistent with implant measurements. Device programmed at 3.5V for extra safety margin until 3 month visit. Histogram distribution appropriate for patient and level of activity. No mode switches or ventricular arrhythmias noted. Patient educated about wound care, arm mobility, lifting restrictions, shock plan. Patient is enrolled in remote monitoring, next scheduled check 03/04/21.  ROV with Dr. [name] 03/04/21.

## 2020-12-04 NOTE — Progress Notes (Signed)
Cardiology Office Note   Date:  12/05/2020   ID:  [name], DOB [birth year], MRN [id number]  PCP:  Leonides Sake, MD  Cardiologist:   [name], MD   Chief Complaint  Patient presents with   Edema   Shortness of Breath       History of Present Illness: [name] is a 87 y.o. male who presents for followup of his coronary disease and cardiomyopathy.   At the last visit his EF was lower than previous at 25% and I started [place].  However, he developed significant hypotension and has not tolerated this and has been off that med.  A repeat echo was unchanged at 25%.    Since I last saw him he had upgrade to CRT D.  He does not report any new shortness of breath.  However, he is very stoic.  When he was here with his daughter previously she reported increasing dyspnea with mild exertion.  He does not think he is breathing any different than he was.  He is not describing any PND or orthopnea.  Has had no new palpitations, presyncope or syncope.  His lower extremity swelling is at his previous baseline.  He is not having any new chest pressure, neck or arm discomfort.  His weight is actually come down with some effort on his part.  He is walking to a nearby church and walking around the parking lot.   Past Medical History:  Diagnosis Date   Atrial flutter ([place])    (I could not find documentation of this rhythm.)   AUTOMATIC IMPLANTABLE CARDIAC DEFIBRILLATOR SITU    Automatic implantable cardioverter-defibrillator in situ 12/05/2009   Qualifier: Diagnosis of  By: [name], MD, [facility], Binnie Kand    CAD (coronary artery disease) 05/22/2011   Carotid stenosis 05/22/2011   CHF CONGESTIVE HEART FAILURE    45% by echo 2015   COPD 12/01/2007   Qualifier: Diagnosis of  By: [name] NP, [name]     DM2 (diabetes mellitus, type 2) ([place])    DYSLIPIDEMIA    DYSPNEA    Edema    Essential hypertension 11/17/2007   Qualifier: Diagnosis of  By: [name]     Gout     HYPERTENSION    HYPOTHYROIDISM    MYOCARDIAL INFARCTION    MI 1999, stent x 2 to RCA, 70% circ, 30 and 40 % LADs   S/P updgrade to CRT-D 11/23/2020   WEIGHT GAIN, ABNORMAL     Past Surgical History:  Procedure Laterality Date   ANGIOPLASTY     stent   APPENDECTOMY  1957   BIV UPGRADE N/A 11/22/2020   Procedure: BIV ICD UPGRADE;  Surgeon: Evans Lance, MD;  Location: [facility];  Service: Cardiovascular;  Laterality: N/A;   CARDIAC DEFIBRILLATOR PLACEMENT     CAROTID STENT     EYE SURGERY  1985   LOWER EXTREMITY ANGIOGRAPHY N/A 05/04/2018   Procedure: LOWER EXTREMITY ANGIOGRAPHY;  Surgeon: [name], MD;  Location: [facility];  Service: Cardiovascular;  Laterality: N/A;   PERIPHERAL VASCULAR INTERVENTION Bilateral 05/04/2018   Procedure: PERIPHERAL VASCULAR INTERVENTION;  Surgeon: [name], MD;  Location: [facility];  Service: Cardiovascular;  Laterality: Bilateral;  [place]     Current Outpatient Medications  Medication Sig Dispense Refill   ACCU-CHEK AVIVA PLUS test strip      acetaminophen (TYLENOL) 650 MG CR tablet Take 650 mg by mouth every 8 (  eight) hours as needed for pain.     aspirin EC 81 MG tablet Take 81 mg by mouth in the morning. Swallow whole.     atorvastatin (LIPITOR) 40 MG tablet Take 40 mg by mouth in the morning.     clopidogrel (PLAVIX) 75 MG tablet Take 1 tablet (75 mg total) by mouth in the morning.     FLOVENT HFA 110 MCG/ACT inhaler Inhale 2 puffs into the lungs 2 (two) times daily.  3   fluticasone (FLONASE) 50 MCG/ACT nasal spray Place 2 sprays into the nose in the morning.     furosemide (LASIX) 20 MG tablet Take 1 tablet (20 mg total) by mouth in the morning.     ipratropium-albuterol (DUONEB) 0.5-2.5 (3) MG/3ML SOLN Take 3 mLs by nebulization every 6 (six) hours as needed (wheezing/shortness of breath).     metoprolol (TOPROL-XL) 200 MG 24 hr tablet Take 100 mg by mouth in the morning and at bedtime.      mirtazapine (REMERON) 30 MG tablet Take 30 mg by mouth at bedtime.     montelukast (SINGULAIR) 10 MG tablet Take 10 mg by mouth daily.  12   pantoprazole (PROTONIX) 40 MG tablet Take 40 mg by mouth 2 (two) times daily. Morning & Evening     Polyethyl Glycol-Propyl Glycol (SYSTANE OP) Place 1 drop into both eyes in the morning and at bedtime.     SYNTHROID 200 MCG tablet Take 200 mcg by mouth in the morning.     vitamin B-12 (CYANOCOBALAMIN) 1000 MCG tablet Take 1,000 mcg by mouth in the morning.     losartan (COZAAR) 50 MG tablet Take 1 tablet (50 mg total) by mouth in the morning and at bedtime. 180 tablet 3   No current facility-administered medications for this visit.    Allergies:   Prednisone and Sulfa antibiotics    ROS:  Please see the history of present illness.   Otherwise, review of systems are positive for .   All other systems are reviewed and negative.    PHYSICAL EXAM: VS:  BP (!) 136/52   Pulse 64   Ht 5\' 8"  (1.727 m)   Wt 168 lb 3.2 oz (76.3 kg)   SpO2 93%   BMI 25.57 kg/m  , BMI Body mass index is 25.57 kg/m. GENERAL:  Well appearing NECK:  No jugular venous distention, waveform within normal limits, carotid upstroke brisk and symmetric, no bruits, no thyromegaly LUNGS:  Clear to auscultation bilaterally CHEST:    He is status post ICD.  Pacemaker pocket with some bruising HEART:  PMI not displaced or sustained,S1 and S2 within normal limits, no S3, no S4, no clicks, no rubs, no murmurs ABD:  Flat, positive bowel sounds normal in frequency in pitch, no bruits, no rebound, no guarding, no midline pulsatile mass, no hepatomegaly, no splenomegaly EXT:  2 plus pulses throughout, moderate bilateral leg edema, no cyanosis no clubbing    EKG:  EKG is not ordered today.   Recent Labs: 07/26/2020: BNP 880.1 11/08/2020: BUN 27; Creatinine, Ser 1.30; Hemoglobin 10.3; Platelets 246; Potassium 4.5; Sodium 139    Lipid Panel    Component Value Date/Time   CHOL 143  06/22/2011 0925   TRIG 125.0 06/22/2011 0925   HDL 36.60 (L) 06/22/2011 0925   CHOLHDL 4 06/22/2011 0925   VLDL 25.0 06/22/2011 0925   LDLCALC 81 06/22/2011 0925      Wt Readings from Last 3 Encounters:  12/05/20 168 lb 3.2 oz (  76.3 kg)  11/22/20 167 lb 9.6 oz (76 kg)  09/03/20 166 lb 12.8 oz (75.7 kg)      Other studies Reviewed: Additional studies/ records that were reviewed today include: None Review of the above records demonstrates:    ASSESSMENT AND PLAN:  CAD:    The patient has no new sypmtoms.  No further cardiovascular testing is indicated.  We will continue with aggressive risk reduction and meds as listed.  HTN:    His blood pressure is actually up today.  He really has had a hard time with med titration but I think today I can try 50 mg of Cozaar twice daily.  He really did not tolerate Entresto before.   DYSLIPIDEMIA:    His LDL was 43 with an HDL of 35.  No change.   CARDIOMYOPATHY:    I am going to try to titrate his meds as above.    I would like to see him back in 1 month.  He might tolerate spironolactone we will have to watch carefully.  I am going to check a basic metabolic profile in 1 week.  CRT D:   He is now status post upgrade.   Current medicines are reviewed at length with the patient today.  The patient does not have concerns regarding medicines.  The following changes have been made: As above  Labs/ tests ordered today include:    Orders Placed This Encounter  Procedures   Basic metabolic panel      Disposition:   FU with in me in June   Signed, [name], MD  12/05/2020 4:26 PM    [place]

## 2020-12-04 NOTE — Progress Notes (Signed)
Remote ICD transmission.   

## 2020-12-05 ENCOUNTER — Ambulatory Visit (INDEPENDENT_AMBULATORY_CARE_PROVIDER_SITE_OTHER): Payer: Medicare Other | Admitting: Cardiology

## 2020-12-05 ENCOUNTER — Encounter: Payer: Self-pay | Admitting: Cardiology

## 2020-12-05 ENCOUNTER — Other Ambulatory Visit: Payer: Self-pay

## 2020-12-05 VITALS — BP 136/52 | HR 64 | Ht 68.0 in | Wt 168.2 lb

## 2020-12-05 DIAGNOSIS — E785 Hyperlipidemia, unspecified: Secondary | ICD-10-CM

## 2020-12-05 DIAGNOSIS — Z5181 Encounter for therapeutic drug level monitoring: Secondary | ICD-10-CM

## 2020-12-05 DIAGNOSIS — I2510 Atherosclerotic heart disease of native coronary artery without angina pectoris: Secondary | ICD-10-CM

## 2020-12-05 DIAGNOSIS — I255 Ischemic cardiomyopathy: Secondary | ICD-10-CM | POA: Diagnosis not present

## 2020-12-05 DIAGNOSIS — I10 Essential (primary) hypertension: Secondary | ICD-10-CM

## 2020-12-05 MED ORDER — LOSARTAN POTASSIUM 50 MG PO TABS
50.0000 mg | ORAL_TABLET | Freq: Two times a day (BID) | ORAL | 3 refills | Status: DC
Start: 2020-12-05 — End: 2020-12-20

## 2020-12-05 NOTE — Patient Instructions (Addendum)
Medication Instructions:  INCREASE YOUR LOSARTAN TO 50 MG TWICE A DAY   *If you need a refill on your cardiac medications before your next appointment, please call your pharmacy*  Lab Work: BMET IN 1 WEEK   If you have labs (blood work) drawn today and your tests are completely normal, you will receive your results only by: [place] (if you have MyChart) OR A paper copy in the mail If you have any lab test that is abnormal or we need to change your treatment, we will call you to review the results.  Testing/Procedures: NONE  Follow-Up: At [facility], you and your health needs are our priority.  As part of our continuing mission to provide you with exceptional heart care, we have created designated Provider Care Teams.  These Care Teams include your primary Cardiologist (physician) and Advanced Practice Providers (APPs -  Physician Assistants and Nurse Practitioners) who all work together to provide you with the care you need, when you need it.  We recommend signing up for the patient portal called "MyChart".  Sign up information is provided on this After Visit Summary.  MyChart is used to connect with patients for Virtual Visits (Telemedicine).  Patients are able to view lab/test results, encounter notes, upcoming appointments, etc.  Non-urgent messages can be sent to your provider as well.   To learn more about what you can do with MyChart, go to [email or web address].    Your next appointment:   4-6 WEEKS  The format for your next appointment:   In Person  Provider:    WITH NP/PA

## 2020-12-11 DIAGNOSIS — I10 Essential (primary) hypertension: Secondary | ICD-10-CM | POA: Diagnosis not present

## 2020-12-11 DIAGNOSIS — Z5181 Encounter for therapeutic drug level monitoring: Secondary | ICD-10-CM | POA: Diagnosis not present

## 2020-12-12 DIAGNOSIS — D0439 Carcinoma in situ of skin of other parts of face: Secondary | ICD-10-CM | POA: Diagnosis not present

## 2020-12-12 LAB — BASIC METABOLIC PANEL WITH GFR
BUN/Creatinine Ratio: 27 — ABNORMAL HIGH (ref 10–24)
BUN: 35 mg/dL — ABNORMAL HIGH (ref 8–27)
CO2: 22 mmol/L (ref 20–29)
Calcium: 9.3 mg/dL (ref 8.6–10.2)
Chloride: 105 mmol/L (ref 96–106)
Creatinine, Ser: 1.29 mg/dL — ABNORMAL HIGH (ref 0.76–1.27)
Glucose: 114 mg/dL — ABNORMAL HIGH (ref 65–99)
Potassium: 5.4 mmol/L — ABNORMAL HIGH (ref 3.5–5.2)
Sodium: 140 mmol/L (ref 134–144)
eGFR: 55 mL/min/1.73 — ABNORMAL LOW

## 2020-12-13 ENCOUNTER — Telehealth: Payer: Self-pay | Admitting: *Deleted

## 2020-12-13 DIAGNOSIS — N289 Disorder of kidney and ureter, unspecified: Secondary | ICD-10-CM

## 2020-12-13 NOTE — Telephone Encounter (Signed)
Spoke with pt, aware of dr hochrein's recommendations. Lab orders mailed to the pt

## 2020-12-13 NOTE — Telephone Encounter (Signed)
-----   Message from [name], MD sent at 12/12/2020  1:25 PM EDT ----- I would like to have this repeated next week and if still high he might need to have his ARB reduced.  Please order a BMET and call with results.  Thanks.

## 2020-12-18 DIAGNOSIS — N289 Disorder of kidney and ureter, unspecified: Secondary | ICD-10-CM | POA: Diagnosis not present

## 2020-12-19 LAB — BASIC METABOLIC PANEL
BUN/Creatinine Ratio: 20 (ref 10–24)
BUN: 28 mg/dL — ABNORMAL HIGH (ref 8–27)
CO2: 25 mmol/L (ref 20–29)
Calcium: 9 mg/dL (ref 8.6–10.2)
Chloride: 108 mmol/L — ABNORMAL HIGH (ref 96–106)
Creatinine, Ser: 1.4 mg/dL — ABNORMAL HIGH (ref 0.76–1.27)
Glucose: 104 mg/dL — ABNORMAL HIGH (ref 65–99)
Potassium: 5.6 mmol/L — ABNORMAL HIGH (ref 3.5–5.2)
Sodium: 144 mmol/L (ref 134–144)
eGFR: 49 mL/min/{1.73_m2} — ABNORMAL LOW (ref >59)

## 2020-12-20 ENCOUNTER — Telehealth: Payer: Self-pay | Admitting: *Deleted

## 2020-12-20 DIAGNOSIS — E875 Hyperkalemia: Secondary | ICD-10-CM

## 2020-12-20 MED ORDER — LOSARTAN POTASSIUM 25 MG PO TABS: 25.0000 mg | ORAL_TABLET | Freq: Two times a day (BID) | ORAL | Status: DC

## 2020-12-20 NOTE — Telephone Encounter (Signed)
pt aware of results  Lab orders mailed to the pt  

## 2020-12-20 NOTE — Telephone Encounter (Signed)
-----   Message from Lelon Perla, MD sent at 12/19/2020  7:31 AM EDT ----- Decrease losartan to 25 mg daily; bmet one week Kirk Ruths

## 2020-12-23 DIAGNOSIS — E875 Hyperkalemia: Secondary | ICD-10-CM | POA: Diagnosis not present

## 2020-12-24 LAB — BASIC METABOLIC PANEL
BUN/Creatinine Ratio: 24 (ref 10–24)
BUN: 32 mg/dL — ABNORMAL HIGH (ref 8–27)
CO2: 26 mmol/L (ref 20–29)
Calcium: 9.2 mg/dL (ref 8.6–10.2)
Chloride: 106 mmol/L (ref 96–106)
Creatinine, Ser: 1.31 mg/dL — ABNORMAL HIGH (ref 0.76–1.27)
Glucose: 96 mg/dL (ref 65–99)
Potassium: 5.2 mmol/L (ref 3.5–5.2)
Sodium: 141 mmol/L (ref 134–144)
eGFR: 53 mL/min/{1.73_m2} — ABNORMAL LOW (ref >59)

## 2020-12-30 DIAGNOSIS — H6982 Other specified disorders of Eustachian tube, left ear: Secondary | ICD-10-CM | POA: Diagnosis not present

## 2021-01-08 ENCOUNTER — Telehealth: Payer: Self-pay | Admitting: Cardiology

## 2021-01-08 NOTE — Telephone Encounter (Signed)
Returned [employer] (ok per DPR), she reports her father's primary care provider retired and she was wondering if Dr. Percival Spanish had any recommendations for PCPs in the [place] area affiliated with [facility]. Nurse advised pt's daughter her message would be forwarded to Dr. Percival Spanish for his review and advice. Pt's daughter verbalized understanding, all questions/concerns addressed at this time.

## 2021-01-08 NOTE — Telephone Encounter (Signed)
Patient is calling in to see if dr hochrein can referred her to a new PCP dr because her current dr is not long practicing. Please advise

## 2021-01-09 ENCOUNTER — Telehealth: Payer: Self-pay

## 2021-01-09 DIAGNOSIS — Z95810 Presence of automatic (implantable) cardiac defibrillator: Secondary | ICD-10-CM | POA: Diagnosis not present

## 2021-01-09 DIAGNOSIS — K2270 Barrett's esophagus without dysplasia: Secondary | ICD-10-CM | POA: Diagnosis not present

## 2021-01-09 DIAGNOSIS — K2100 Gastro-esophageal reflux disease with esophagitis, without bleeding: Secondary | ICD-10-CM | POA: Diagnosis not present

## 2021-01-09 NOTE — Telephone Encounter (Addendum)
   Patient Name: [name]  DOB: 1934/04/16 MRN: [id number]  Primary Cardiologist: [name], MD  Chart reviewed as part of pre-operative protocol coverage. At last OV 11/2020, patient's losartan was increased with plan for follow-up that is scheduled later this month, 01/24/21.with Jory Sims, NP Given that this is well in advance of his upcoming EGD, would suggest to complete that appointment to make finalized recommendations as long as clinically stable and no interim changes. I added clearance modifier to appt notes.  In the interim while awaiting that appt so that the information is available to Curt Bears, will route to Dr. Percival Spanish for input on holding ASA + Plavix in anticipation of the colonoscopy. Chart outlines hx of MI 1999, stent x 2 to RCA. I cannot find any cath reports in our system. Dr. Percival Spanish - you do not need to route response, it will just be helpful to have your thoughts in the notes for his upcoming visit. Thanks.   Charlie Pitter, PA-C 01/09/2021, 5:30 PM

## 2021-01-09 NOTE — Telephone Encounter (Signed)
Spoke to patient's daughter Mardene Celeste Dr.Hochrein's advice given.Stated she will call their office tomorrow.

## 2021-01-09 NOTE — Telephone Encounter (Signed)
   [facility] Pre-operative Risk Assessment    Patient Name: [name]  DOB: [birth year] MRN: [id number]   Request for surgical clearance:  What type of surgery is being performed  EGD  When is this surgery scheduled 02/26/21  What type of clearance is required  Pharmacy  Are there any medications that need to be held prior to surgery and how long  Aspirin and Plavix  Practice name and name of physician performing surgery [facility]   What is the office phone number [phone]   7.   What is the office fax number        [phone]  8.   Anesthesia type  Propofol sedation   Kathyrn Lass 01/09/2021, 4:42 PM  _________________________________________________________________   (provider comments below)

## 2021-01-09 NOTE — Telephone Encounter (Signed)
Pt is returning call from 01/08/21

## 2021-01-09 NOTE — Telephone Encounter (Signed)
[facility] at [facility] taking new patients.I will send message to Dr.Hochrein.

## 2021-01-09 NOTE — Telephone Encounter (Signed)
Spoke to patient's daughter Mardene Celeste stated her father needs a PCP.She wants Dr.Hochrein's recommendation.Message sent to [place].

## 2021-01-23 NOTE — Progress Notes (Signed)
Cardiology Office Note   Date:  01/24/2021   ID:  [name], DOB 01/05/1934, MRN [id number]  PCP:  Leonides Sake, MD  Cardiologist:  Dr. Percival Spanish  CC: Pre-Operative Clearance for EGD   History of Present Illness: [name] is a 87 y.o. male who presents for ongoing assessment and management of coronary artery disease, with MI in 1999 and stent x2 to the right coronary artery,  ischemic cardiomyopathy, chronic systolic heart failure with an EF of 25 to 30%, VT status post ICD, hypertension, hyperlipidemia, diabetes mellitus, COPD ,and chronic left bundle branch block.  He was recently discharged on 11/23/2020 after having CRT-D upgrade due to NYHA III symptoms and inability to titrate GDMT due to hypotension.  He underwent implantation of [employer] CRT-D.(serial [id number]) on 11/22/20 by Dr. [name].    He also comes today for preoperative evaluation for EGD, with [facility], [place]. The procedure is scheduled for 02/26/2021;  Address need to hold aspirin and Plavix for procedure.  EGD fis or GERD symptoms. His chart was reviewed by Melina Copa, PA-C, on 01/09/2021 with recommendations to have this discussed during this follow-up appointment today.   He has been doing well since being seen last. Today he did not take his medications prior to coming to the office  He denies chest pain, dyspnea, melena or syncope. BP is normally well controlled. He has an appointment with a new PCP in December to be established.   Past Medical History:  Diagnosis Date   Atrial flutter ([place])    (I could not find documentation of this rhythm.)   AUTOMATIC IMPLANTABLE CARDIAC DEFIBRILLATOR SITU    Automatic implantable cardioverter-defibrillator in situ 12/05/2009   Qualifier: Diagnosis of  By: [name], MD, [facility], Binnie Kand    CAD (coronary artery disease) 05/22/2011   Carotid stenosis 05/22/2011   CHF CONGESTIVE HEART FAILURE    45% by echo 2015   COPD 12/01/2007    Qualifier: Diagnosis of  By: [name] NP, [name]     DM2 (diabetes mellitus, type 2) ([place])    DYSLIPIDEMIA    DYSPNEA    Edema    Essential hypertension 11/17/2007   Qualifier: Diagnosis of  By: [name]     Gout    HYPERTENSION    HYPOTHYROIDISM    MYOCARDIAL INFARCTION    MI 1999, stent x 2 to RCA, 70% circ, 30 and 40 % LADs   S/P updgrade to CRT-D 11/23/2020   WEIGHT GAIN, ABNORMAL     Past Surgical History:  Procedure Laterality Date   ANGIOPLASTY     stent   APPENDECTOMY  1957   BIV UPGRADE N/A 11/22/2020   Procedure: BIV ICD UPGRADE;  Surgeon: Evans Lance, MD;  Location: [place] [facility];  Service: Cardiovascular;  Laterality: N/A;   CARDIAC DEFIBRILLATOR PLACEMENT     CAROTID STENT     EYE SURGERY  1985   LOWER EXTREMITY ANGIOGRAPHY N/A 05/04/2018   Procedure: LOWER EXTREMITY ANGIOGRAPHY;  Surgeon: [name], MD;  Location: [place] CV LAB;  Service: Cardiovascular;  Laterality: N/A;   PERIPHERAL VASCULAR INTERVENTION Bilateral 05/04/2018   Procedure: PERIPHERAL VASCULAR INTERVENTION;  Surgeon: [name], MD;  Location: [place] [facility];  Service: Cardiovascular;  Laterality: Bilateral;  [place]     Current Outpatient Medications  Medication Sig Dispense Refill   ACCU-CHEK AVIVA PLUS test strip      acetaminophen (TYLENOL) 650 MG CR tablet Take 650  mg by mouth every 8 (eight) hours as needed for pain.     aspirin EC 81 MG tablet Take 81 mg by mouth in the morning. Swallow whole.     atorvastatin (LIPITOR) 40 MG tablet Take 1 tablet (40 mg total) by mouth in the morning. 90 tablet 3   clopidogrel (PLAVIX) 75 MG tablet Take 1 tablet (75 mg total) by mouth in the morning.     FLOVENT HFA 110 MCG/ACT inhaler Inhale 2 puffs into the lungs 2 (two) times daily.  3   fluticasone (FLONASE) 50 MCG/ACT nasal spray Place 2 sprays into the nose in the morning.     furosemide (LASIX) 20 MG tablet Take 1 tablet (20 mg total) by mouth in the morning. 90  tablet 3   ipratropium-albuterol (DUONEB) 0.5-2.5 (3) MG/3ML SOLN Take 3 mLs by nebulization every 6 (six) hours as needed (wheezing/shortness of breath).     losartan (COZAAR) 25 MG tablet Take 1 tablet (25 mg total) by mouth in the morning and at bedtime. 90 tablet 3   metoprolol (TOPROL-XL) 200 MG 24 hr tablet Take 0.5 tablets (100 mg total) by mouth in the morning and at bedtime. 90 tablet 3   mirtazapine (REMERON) 30 MG tablet Take 30 mg by mouth at bedtime.     montelukast (SINGULAIR) 10 MG tablet Take 10 mg by mouth daily.  12   pantoprazole (PROTONIX) 40 MG tablet Take 40 mg by mouth 2 (two) times daily. Morning & Evening     Polyethyl Glycol-Propyl Glycol (SYSTANE OP) Place 1 drop into both eyes in the morning and at bedtime.     SYNTHROID 200 MCG tablet Take 200 mcg by mouth in the morning.     vitamin B-12 (CYANOCOBALAMIN) 1000 MCG tablet Take 1,000 mcg by mouth in the morning.     No current facility-administered medications for this visit.    Allergies:   Prednisone and Sulfa antibiotics    Social History:  The patient  reports that he quit smoking about 37 years ago. His smoking use included cigarettes. He has never used smokeless tobacco. He reports that he does not drink alcohol and does not use drugs.   Family History:  The patient's family history includes Cancer in his brother; Kidney disease in his father; Other in his mother.    ROS: All other systems are reviewed and negative. Unless otherwise mentioned in H&P    PHYSICAL EXAM: VS:  BP (!) 178/70   Pulse 65   Ht '5\' 8"'$  (1.727 m)   Wt 164 lb 9.6 oz (74.7 kg)   BMI 25.03 kg/m  , BMI Body mass index is 25.03 kg/m. GEN: Well nourished, well developed, in no acute distress HEENT: normal Neck: no JVD, Right carotid bruits, none on the left, or masses Cardiac: RRR; soft 1/6 systolic  murmur,  rubs, or gallops,no edema  Respiratory:  Clear to auscultation bilaterally, normal work of breathing GI: soft, nontender,  nondistended, + BS MS: no deformity or atrophy Skin: warm and dry, no rash Neuro:  Strength and sensation are intact Psych: euthymic mood, full affect   EKG:  EKG is ordered today. The ekg ordered today demonstrates Atrial sensed, ventricular paced rhythm rate of 65 bpm.    Recent Labs: 07/26/2020: BNP 880.1 11/08/2020: Hemoglobin 10.3; Platelets 246 12/23/2020: BUN 32; Creatinine, Ser 1.31; Potassium 5.2; Sodium 141    Lipid Panel    Component Value Date/Time   CHOL 143 06/22/2011 0925   TRIG 125.0  06/22/2011 0925   HDL 36.60 (L) 06/22/2011 0925   CHOLHDL 4 06/22/2011 0925   VLDL 25.0 06/22/2011 0925   LDLCALC 81 06/22/2011 0925      Wt Readings from Last 3 Encounters:  01/24/21 164 lb 9.6 oz (74.7 kg)  12/05/20 168 lb 3.2 oz (76.3 kg)  11/22/20 167 lb 9.6 oz (76 kg)      Other studies Reviewed:  Echocardiogram 03/08/2018 Left ventricle: The cavity size was mildly dilated. Systolic    function was severely reduced. The estimated ejection fraction    was in the range of 25% to 30%. Severe diffuse hypokinesis with    distinct regional wall motion abnormalities. There is akinesis of    the basalinferoseptal myocardium. There is akinesis of the    inferior myocardium. There was an increased relative contribution    of atrial contraction to ventricular filling. Doppler parameters    are consistent with abnormal left ventricular relaxation (grade 1    diastolic dysfunction).  - Ventricular septum: Septal motion showed moderate paradox. These    changes are consistent with intraventricular conduction delay.  - Aortic valve: Trileaflet; mildly thickened, moderately calcified    leaflets.  - Mitral valve: There was trivial regurgitation.  - Left atrium: The atrium was mildly dilated.  - Right ventricle: Pacer wire or catheter noted in right ventricle.  - Tricuspid valve: There was trivial regurgitation.   Myocardial Infarction 1999 (left main 30% stenosis, LAD 30%  stenosis     before first diagonal, 40 % stenosis after septal perforator, 40%     diagonal stenosis, circumflex 70% proximal stenosis, right coronary     artery large and dominant with 30% proximal stenosis, 80% narrowing     at the ostium of the PDA and 90% narrowing of a small branch of the     PDA.  The patient had tandem stenting to the right coronary     artery).  NM Stress Test 06/21/2010 Impression Exercise Capacity:  Adenosine study with no exercise. BP Response:  Normal blood pressure response. Clinical Symptoms:  There is chest tightness. ECG Impression:  Baseline:  LBBB.  EKG uninterpretable due to LBBB at rest and stress. Comparison with Prior Nuclear Study: No images to compare   Overall Impression:  Abnormal stress nuclear study with a large, fixed inferior and apical defect consistent with previous infarct; no ischemia.  ASSESSMENT AND PLAN:  1.  Pre-Operative Evaluation for EDG: Chart reviewed as part of pre-operative protocol coverage. Given past medical history and time since last visit, based on ACC/AHA guidelines, [name] would be at acceptable risk for the planned procedure without further cardiovascular testing. He may stop ASA and Plavix for 5 days prior to EDG, restarting ASAP thereafter.   2. CAD: Most recent stress test in 2012 was low risk.  He is asymptomatic. Can consider screening myoview at the discretion of Dr. Percival Spanish as it has been over 5 years. Will continue secondary risk prevention. Continue BB, DAPT except for procedure, and statin therapy.   3. Hypertension; BP elevated today. He has not taken his medications yet today.  Normally compliant. BP good at home.   4. Hypercholesterolemia: Goal of LDL is < 70.  Continue at atorvastatin. Will have labs completed.   5. Right carotid bruit: Check doppler ultrasound to assess.      Current medicines are reviewed at length with the patient today.  I have spent 25 min's  dedicated to the care of  this patient  on the date of this encounter to include pre-visit review of records, assessment, management and diagnostic testing,with shared decision making.  Labs/ tests ordered today include:Carotid ultrasound, BMET, CBC, Lipids and LFT's   Phill Myron. West Pugh, ANP, AACC   01/24/2021 2:47 PM    [facility] [address] Office [phone] Fax [phone]  Notice: This dictation was prepared with Dragon dictation along with smaller phrase technology. Any transcriptional errors that result from this process are unintentional and may not be corrected upon review.

## 2021-01-24 ENCOUNTER — Other Ambulatory Visit: Payer: Self-pay

## 2021-01-24 ENCOUNTER — Ambulatory Visit: Payer: Medicare Other | Admitting: Adult Health

## 2021-01-24 VITALS — BP 178/70 | HR 65 | Ht 68.0 in | Wt 164.6 lb

## 2021-01-24 DIAGNOSIS — E785 Hyperlipidemia, unspecified: Secondary | ICD-10-CM

## 2021-01-24 DIAGNOSIS — I2510 Atherosclerotic heart disease of native coronary artery without angina pectoris: Secondary | ICD-10-CM | POA: Diagnosis not present

## 2021-01-24 DIAGNOSIS — E7800 Pure hypercholesterolemia, unspecified: Secondary | ICD-10-CM

## 2021-01-24 DIAGNOSIS — E875 Hyperkalemia: Secondary | ICD-10-CM | POA: Diagnosis not present

## 2021-01-24 DIAGNOSIS — Z01810 Encounter for preprocedural cardiovascular examination: Secondary | ICD-10-CM | POA: Diagnosis not present

## 2021-01-24 DIAGNOSIS — I6521 Occlusion and stenosis of right carotid artery: Secondary | ICD-10-CM

## 2021-01-24 DIAGNOSIS — I10 Essential (primary) hypertension: Secondary | ICD-10-CM

## 2021-01-24 DIAGNOSIS — Z79899 Other long term (current) drug therapy: Secondary | ICD-10-CM | POA: Diagnosis not present

## 2021-01-24 MED ORDER — LOSARTAN POTASSIUM 25 MG PO TABS: 25.0000 mg | ORAL_TABLET | Freq: Two times a day (BID) | ORAL | 3 refills | Status: DC

## 2021-01-24 MED ORDER — METOPROLOL SUCCINATE ER 200 MG PO TB24
100.0000 mg | ORAL_TABLET | Freq: Two times a day (BID) | ORAL | 3 refills | Status: DC
Start: 2021-01-24 — End: 2022-06-13

## 2021-01-24 MED ORDER — FUROSEMIDE 20 MG PO TABS: 20.0000 mg | ORAL_TABLET | Freq: Every morning | ORAL | 3 refills | Status: DC

## 2021-01-24 MED ORDER — ATORVASTATIN CALCIUM 40 MG PO TABS: 40.0000 mg | ORAL_TABLET | Freq: Every morning | ORAL | 3 refills | Status: DC

## 2021-01-24 NOTE — Patient Instructions (Addendum)
Medication Instructions:  Continue current medications  *If you need a refill on your cardiac medications before your next appointment, please call your pharmacy*   Lab Work: CBC, BMP, Fasting Lipid and Liver  If you have labs (blood work) drawn today and your tests are completely normal, you will receive your results only by: [name] (if you have MyChart) OR A paper copy in the mail If you have any lab test that is abnormal or we need to change your treatment, we will call you to review the results.   Testing/Procedures: Your physician has requested that you have a carotid duplex. This test is an ultrasound of the carotid arteries in your neck. It looks at blood flow through these arteries that supply the brain with blood. Allow one hour for this exam. There are no restrictions or special instructions.   Follow-Up: At [facility], you and your health needs are our priority.  As part of our continuing mission to provide you with exceptional heart care, we have created designated Provider Care Teams.  These Care Teams include your primary Cardiologist (physician) and Advanced Practice Providers (APPs -  Physician Assistants and Nurse Practitioners) who all work together to provide you with the care you need, when you need it.  We recommend signing up for the patient portal called "MyChart".  Sign up information is provided on this After Visit Summary.  MyChart is used to connect with patients for Virtual Visits (Telemedicine).  Patients are able to view lab/test results, encounter notes, upcoming appointments, etc.  Non-urgent messages can be sent to your provider as well.   To learn more about what you can do with MyChart, go to [email or web address].    Your next appointment:   In December  The format for your next appointment:   In Person  Provider:   [name], MD   Other Instructions HOLD Aspirin and Plavix 5 days prior 09/23 for your procedure on  02/26/2021

## 2021-01-27 ENCOUNTER — Other Ambulatory Visit (HOSPITAL_COMMUNITY): Payer: Self-pay | Admitting: Adult Health

## 2021-01-27 ENCOUNTER — Other Ambulatory Visit: Payer: Self-pay

## 2021-01-27 ENCOUNTER — Ambulatory Visit (HOSPITAL_COMMUNITY)
Admission: RE | Admit: 2021-01-27 | Discharge: 2021-01-27 | Disposition: A | Discharge status: Home / Self Care | Payer: Medicare Other | Source: Ambulatory Visit | Attending: Cardiology | Admitting: Cardiology

## 2021-01-27 DIAGNOSIS — Z9889 Other specified postprocedural states: Secondary | ICD-10-CM

## 2021-01-27 DIAGNOSIS — I6521 Occlusion and stenosis of right carotid artery: Secondary | ICD-10-CM

## 2021-01-29 ENCOUNTER — Encounter: Payer: Self-pay | Admitting: *Deleted

## 2021-01-31 ENCOUNTER — Telehealth: Payer: Self-pay

## 2021-01-31 NOTE — Telephone Encounter (Addendum)
Attempted to call patient with results unable to leave message voicemail not set . Will mail letter 01/31/21----- Message from [name], NP sent at 01/29/2021  5:23 PM EDT ----- I have reviewed his carotid study. No significant blockages. No changes in medications.  [name]

## 2021-02-06 DIAGNOSIS — I2510 Atherosclerotic heart disease of native coronary artery without angina pectoris: Secondary | ICD-10-CM | POA: Diagnosis not present

## 2021-02-06 DIAGNOSIS — Z79899 Other long term (current) drug therapy: Secondary | ICD-10-CM | POA: Diagnosis not present

## 2021-02-06 DIAGNOSIS — E785 Hyperlipidemia, unspecified: Secondary | ICD-10-CM | POA: Diagnosis not present

## 2021-02-07 ENCOUNTER — Telehealth: Payer: Self-pay

## 2021-02-07 DIAGNOSIS — E785 Hyperlipidemia, unspecified: Secondary | ICD-10-CM

## 2021-02-07 LAB — HEPATIC FUNCTION PANEL
ALT: 6 IU/L (ref 0–44)
AST: 17 IU/L (ref 0–40)
Albumin: 4.1 g/dL (ref 3.6–4.6)
Alkaline Phosphatase: 48 IU/L (ref 44–121)
Bilirubin Total: 0.9 mg/dL (ref 0.0–1.2)
Bilirubin, Direct: 0.23 mg/dL (ref 0.00–0.40)
Total Protein: 7.7 g/dL (ref 6.0–8.5)

## 2021-02-07 LAB — BASIC METABOLIC PANEL
BUN/Creatinine Ratio: 19 (ref 10–24)
BUN: 26 mg/dL (ref 8–27)
CO2: 24 mmol/L (ref 20–29)
Calcium: 9.5 mg/dL (ref 8.6–10.2)
Chloride: 105 mmol/L (ref 96–106)
Creatinine, Ser: 1.35 mg/dL — ABNORMAL HIGH (ref 0.76–1.27)
Glucose: 104 mg/dL — ABNORMAL HIGH (ref 65–99)
Potassium: 5.4 mmol/L — ABNORMAL HIGH (ref 3.5–5.2)
Sodium: 140 mmol/L (ref 134–144)
eGFR: 51 mL/min/{1.73_m2} — ABNORMAL LOW (ref >59)

## 2021-02-07 LAB — CBC
Hematocrit: 31.3 % — ABNORMAL LOW (ref 37.5–51.0)
Hemoglobin: 10.1 g/dL — ABNORMAL LOW (ref 13.0–17.7)
MCH: 31 pg (ref 26.6–33.0)
MCHC: 32.3 g/dL (ref 31.5–35.7)
MCV: 96 fL (ref 79–97)
Platelets: 214 10*3/uL (ref 150–450)
RBC: 3.26 x10E6/uL — ABNORMAL LOW (ref 4.14–5.80)
RDW: 17.9 % — ABNORMAL HIGH (ref 11.6–15.4)
WBC: 6.3 10*3/uL (ref 3.4–10.8)

## 2021-02-07 LAB — LIPID PANEL
Chol/HDL Ratio: 2.3 ratio (ref 0.0–5.0)
Cholesterol, Total: 92 mg/dL — ABNORMAL LOW (ref 100–199)
HDL: 40 mg/dL (ref >39)
LDL Chol Calc (NIH): 38 mg/dL (ref 0–99)
Triglycerides: 62 mg/dL (ref 0–149)
VLDL Cholesterol Cal: 14 mg/dL (ref 5–40)

## 2021-02-07 NOTE — Telephone Encounter (Addendum)
Spoke with patient regarding blood test results. Per [name] patient is to reduce Lipitor from 40 mg daily to 20 mg Daily. Patient is to repeat Lipid and Liver panel in 3 months prior to vist with Dr. Percival Spanish.----- Message from [name], NP sent at 02/07/2021  7:17 AM EDT ----- I have reviewed the labs. Please reduce the atorvastatin to 20 mg daily from 40 mg daily.  Repeat labs in 3 months (L/L and BMET)  KL

## 2021-02-20 ENCOUNTER — Other Ambulatory Visit: Payer: Self-pay | Admitting: Vascular Surgery

## 2021-02-20 NOTE — Telephone Encounter (Signed)
Refill to come from cardiology

## 2021-02-21 ENCOUNTER — Ambulatory Visit (INDEPENDENT_AMBULATORY_CARE_PROVIDER_SITE_OTHER): Payer: Medicare Other

## 2021-02-21 DIAGNOSIS — I472 Ventricular tachycardia, unspecified: Secondary | ICD-10-CM

## 2021-02-24 LAB — CUP PACEART REMOTE DEVICE CHECK
Battery Remaining Longevity: 132 mo
Battery Remaining Percentage: 100 %
Brady Statistic RA Percent Paced: 1 %
Brady Statistic RV Percent Paced: 2 %
Date Time Interrogation Session: 20220924235700
HighPow Impedance: 38 Ohm
Implantable Lead Implant Date: 20020206
Implantable Lead Implant Date: 20071005
Implantable Lead Implant Date: 20220624
Implantable Lead Implant Date: 20220624
Implantable Lead Location: 753858
Implantable Lead Location: 753859
Implantable Lead Location: 753860
Implantable Lead Location: 753860
Implantable Lead Model: 148
Implantable Lead Model: 4088
Implantable Lead Model: 4677
Implantable Lead Model: 7841
Implantable Lead Serial Number: 1073796
Implantable Lead Serial Number: 109512
Implantable Lead Serial Number: 224392
Implantable Lead Serial Number: 824442
Implantable Pulse Generator Implant Date: 20220624
Lead Channel Impedance Value: 443 Ohm
Lead Channel Impedance Value: 541 Ohm
Lead Channel Impedance Value: 773 Ohm
Lead Channel Pacing Threshold Amplitude: 0.5 V
Lead Channel Pacing Threshold Pulse Width: 0.4 ms
Lead Channel Setting Pacing Amplitude: 3.2 V
Lead Channel Setting Pacing Amplitude: 3.5 V
Lead Channel Setting Pacing Amplitude: 3.5 V
Lead Channel Setting Pacing Pulse Width: 0.5 ms
Lead Channel Setting Pacing Pulse Width: 1 ms
Lead Channel Setting Sensing Sensitivity: 0.5 mV
Lead Channel Setting Sensing Sensitivity: 1 mV
Pulse Gen Serial Number: 385552

## 2021-02-26 DIAGNOSIS — K219 Gastro-esophageal reflux disease without esophagitis: Secondary | ICD-10-CM | POA: Diagnosis not present

## 2021-02-26 DIAGNOSIS — K2270 Barrett's esophagus without dysplasia: Secondary | ICD-10-CM | POA: Diagnosis not present

## 2021-02-26 NOTE — Progress Notes (Signed)
Remote ICD transmission.   

## 2021-03-04 ENCOUNTER — Ambulatory Visit: Payer: Medicare Other | Admitting: Internal Medicine

## 2021-03-04 ENCOUNTER — Encounter: Payer: Self-pay | Admitting: Internal Medicine

## 2021-03-04 ENCOUNTER — Other Ambulatory Visit: Payer: Self-pay

## 2021-03-04 VITALS — BP 140/70 | HR 71 | Ht 68.0 in | Wt 170.0 lb

## 2021-03-04 DIAGNOSIS — I5022 Chronic systolic (congestive) heart failure: Secondary | ICD-10-CM | POA: Diagnosis not present

## 2021-03-04 DIAGNOSIS — Z950 Presence of cardiac pacemaker: Secondary | ICD-10-CM | POA: Diagnosis not present

## 2021-03-04 DIAGNOSIS — I255 Ischemic cardiomyopathy: Secondary | ICD-10-CM | POA: Diagnosis not present

## 2021-03-04 NOTE — Patient Instructions (Signed)
Medication Instructions:  Your physician recommends that you continue on your current medications as directed. Please refer to the Current Medication list given to you today.  Labwork: None ordered.  Testing/Procedures: None ordered.  Follow-Up: Your physician wants you to follow-up in: one year with Cristopher Peru, MD or one of the following Advanced Practice Providers on your designated Care Team:   Tommye Standard, [place] Legrand Como "Jonni Sanger" Chalmers Cater, [place]  Remote monitoring is used to monitor your ICD from home. This monitoring reduces the number of office visits required to check your device to one time per year. It allows [place] to keep an eye on the functioning of your device to ensure it is working properly. You are scheduled for a device check from home on 05/23/2021. You may send your transmission at any time that day. If you have a wireless device, the transmission will be sent automatically. After your physician reviews your transmission, you will receive a postcard with your next transmission date.  Any Other Special Instructions Will Be Listed Below (If Applicable).  If you need a refill on your cardiac medications before your next appointment, please call your pharmacy.

## 2021-03-04 NOTE — Progress Notes (Signed)
HPI [name] returns today for followup. He is a pleasant 87 yo man with a h/o CAD, chronic systolic heart failure who underwent ICD insertion almost 20 years ago. He had 2 ICD shock with his first device, none since. He has developed worsening CHF. Attempts at uptitration of medical therapy with entresto resulted in hypotension. He underwent biv ICD insertion about 3 months ago. He has done well in the interim.  Allergies  Allergen Reactions   Prednisone Rash   Sulfa Antibiotics Other (See Comments)    Hyper      Current Outpatient Medications  Medication Sig Dispense Refill   ACCU-CHEK AVIVA PLUS test strip      acetaminophen (TYLENOL) 650 MG CR tablet Take 650 mg by mouth every 8 (eight) hours as needed for pain.     aspirin EC 81 MG tablet Take 81 mg by mouth in the morning. Swallow whole.     atorvastatin (LIPITOR) 40 MG tablet Take 1 tablet (40 mg total) by mouth in the morning. 90 tablet 3   clopidogrel (PLAVIX) 75 MG tablet Take 1 tablet (75 mg total) by mouth in the morning.     FLOVENT HFA 110 MCG/ACT inhaler Inhale 2 puffs into the lungs 2 (two) times daily.  3   fluticasone (FLONASE) 50 MCG/ACT nasal spray Place 2 sprays into the nose in the morning.     furosemide (LASIX) 20 MG tablet Take 1 tablet (20 mg total) by mouth in the morning. 90 tablet 3   ipratropium-albuterol (DUONEB) 0.5-2.5 (3) MG/3ML SOLN Take 3 mLs by nebulization every 6 (six) hours as needed (wheezing/shortness of breath).     losartan (COZAAR) 25 MG tablet Take 1 tablet (25 mg total) by mouth in the morning and at bedtime. 90 tablet 3   metoprolol (TOPROL-XL) 200 MG 24 hr tablet Take 0.5 tablets (100 mg total) by mouth in the morning and at bedtime. 90 tablet 3   mirtazapine (REMERON) 30 MG tablet Take 30 mg by mouth at bedtime.     montelukast (SINGULAIR) 10 MG tablet Take 10 mg by mouth daily.  12   pantoprazole (PROTONIX) 40 MG tablet Take 40 mg by mouth 2 (two) times daily. Morning & Evening      Polyethyl Glycol-Propyl Glycol (SYSTANE OP) Place 1 drop into both eyes in the morning and at bedtime.     SYNTHROID 200 MCG tablet Take 200 mcg by mouth in the morning.     vitamin B-12 (CYANOCOBALAMIN) 1000 MCG tablet Take 1,000 mcg by mouth in the morning.     No current facility-administered medications for this visit.     Past Medical History:  Diagnosis Date   Atrial flutter ([place])    (I could not find documentation of this rhythm.)   AUTOMATIC IMPLANTABLE CARDIAC DEFIBRILLATOR SITU    Automatic implantable cardioverter-defibrillator in situ 12/05/2009   Qualifier: Diagnosis of  By: [name], MD, [facility], Binnie Kand    CAD (coronary artery disease) 05/22/2011   Carotid stenosis 05/22/2011   CHF CONGESTIVE HEART FAILURE    45% by echo 2015   COPD 12/01/2007   Qualifier: Diagnosis of  By: [name] NP, [name]     DM2 (diabetes mellitus, type 2) ([place])    DYSLIPIDEMIA    DYSPNEA    Edema    Essential hypertension 11/17/2007   Qualifier: Diagnosis of  By: [name]     Gout    HYPERTENSION    HYPOTHYROIDISM  MYOCARDIAL INFARCTION    MI 1999, stent x 2 to RCA, 70% circ, 30 and 40 % LADs   S/P updgrade to CRT-D 11/23/2020   WEIGHT GAIN, ABNORMAL     ROS:   All systems reviewed and negative except as noted in the HPI.   Past Surgical History:  Procedure Laterality Date   ANGIOPLASTY     stent   APPENDECTOMY  1957   BIV UPGRADE N/A 11/22/2020   Procedure: BIV ICD UPGRADE;  Surgeon: Evans Lance, MD;  Location: [place] [facility];  Service: Cardiovascular;  Laterality: N/A;   CARDIAC DEFIBRILLATOR PLACEMENT     CAROTID STENT     EYE SURGERY  1985   LOWER EXTREMITY ANGIOGRAPHY N/A 05/04/2018   Procedure: LOWER EXTREMITY ANGIOGRAPHY;  Surgeon: [name], MD;  Location: [place] [facility];  Service: Cardiovascular;  Laterality: N/A;   PERIPHERAL VASCULAR INTERVENTION Bilateral 05/04/2018   Procedure: PERIPHERAL VASCULAR INTERVENTION;  Surgeon: [name], MD;  Location: [place] [facility] LAB;  Service: Cardiovascular;  Laterality: Bilateral;  [place]     Family History  Problem Relation Age of Onset   Other Mother        natural causes   Kidney disease Father    Cancer Brother        throat cancer     Social History   Socioeconomic History   Marital status: Married    Spouse name: Ila   Number of children: 3   Years of education: Not on file   Highest education level: Not on file  Occupational History   Not on file  Tobacco Use   Smoking status: Former    Types: Cigarettes    Quit date: 12/10/1983    Years since quitting: 37.2   Smokeless tobacco: Never   Tobacco comments:    quit 1985  Substance and Sexual Activity   Alcohol use: No   Drug use: No   Sexual activity: Not on file  Other Topics Concern   Not on file  Social History Narrative   Not on file   Social Determinants of Health   Financial Resource Strain: Not on file  Food Insecurity: Not on file  Transportation Needs: Not on file  Physical Activity: Not on file  Stress: Not on file  Social Connections: Not on file  Intimate Partner Violence: Not on file     BP 140/70 (BP Location: Left Arm, Patient Position: Sitting, Cuff Size: Normal)   Pulse 71   Ht 5\' 8"  (1.727 m)   Wt 170 lb (77.1 kg)   SpO2 97%   BMI 25.85 kg/m   Physical Exam:  Well appearing 87 yo man, NAD HEENT: Unremarkable Neck:  No JVD, no thyromegally Lymphatics:  No adenopathy Back:  No CVA tenderness Lungs:  Clear with no wheezes. Well healed ICD incision.  HEART:  Regular rate rhythm, no murmurs, no rubs, no clicks Abd:  soft, positive bowel sounds, no organomegally, no rebound, no guarding Ext:  2 plus pulses, no edema, no cyanosis, no clubbing Skin:  No rashes no nodules Neuro:  CN II through XII intact, motor grossly intact  EKG - nsr with biv pacing  DEVICE  Normal device function.  See PaceArt for details.   Assess/Plan:  1. ICD - he is s/p biv ICD  upgrade and appears to be doing well. He has had no ICD therapies.  2. Chronic systolic heart failure - his symptoms are improved,  class 2A.  He is encouraged to continue guideline directed medical therapy. 3. CAD - he denies anginal symptoms. He has severe LV dysfunction. 4. Dyslipidemia - he will continue lipitor.   Carleene Overlie Lousie Calico,MD

## 2021-03-25 ENCOUNTER — Other Ambulatory Visit: Payer: Self-pay | Admitting: Vascular Surgery

## 2021-04-09 ENCOUNTER — Ambulatory Visit (INDEPENDENT_AMBULATORY_CARE_PROVIDER_SITE_OTHER): Payer: Medicare Other | Admitting: Internal Medicine

## 2021-04-09 ENCOUNTER — Encounter: Payer: Self-pay | Admitting: Internal Medicine

## 2021-04-09 ENCOUNTER — Other Ambulatory Visit: Payer: Self-pay

## 2021-04-09 VITALS — BP 144/82 | HR 58 | Temp 98.1°F | Ht 68.0 in | Wt 159.0 lb

## 2021-04-09 DIAGNOSIS — D539 Nutritional anemia, unspecified: Secondary | ICD-10-CM | POA: Insufficient documentation

## 2021-04-09 DIAGNOSIS — I10 Essential (primary) hypertension: Secondary | ICD-10-CM

## 2021-04-09 LAB — CBC WITH DIFFERENTIAL/PLATELET
Basophils Absolute: 0.1 10*3/uL (ref 0.0–0.1)
Basophils Relative: 1.2 % (ref 0.0–3.0)
Eosinophils Absolute: 0.3 10*3/uL (ref 0.0–0.7)
Eosinophils Relative: 3.3 % (ref 0.0–5.0)
HCT: 31.5 % — ABNORMAL LOW (ref 39.0–52.0)
Hemoglobin: 10.3 g/dL — ABNORMAL LOW (ref 13.0–17.0)
Lymphocytes Relative: 25.2 % (ref 12.0–46.0)
Lymphs Abs: 2.1 10*3/uL (ref 0.7–4.0)
MCHC: 32.6 g/dL (ref 30.0–36.0)
MCV: 94.1 fl (ref 78.0–100.0)
Monocytes Absolute: 0.6 10*3/uL (ref 0.1–1.0)
Monocytes Relative: 6.9 % (ref 3.0–12.0)
Neutro Abs: 5.4 10*3/uL (ref 1.4–7.7)
Neutrophils Relative %: 63.4 % (ref 43.0–77.0)
Platelets: 211 10*3/uL (ref 150.0–400.0)
RBC: 3.35 Mil/uL — ABNORMAL LOW (ref 4.22–5.81)
RDW: 23 % — ABNORMAL HIGH (ref 11.5–15.5)
WBC: 8.5 10*3/uL (ref 4.0–10.5)

## 2021-04-09 LAB — BASIC METABOLIC PANEL
BUN: 36 mg/dL — ABNORMAL HIGH (ref 6–23)
CO2: 28 mEq/L (ref 19–32)
Calcium: 9.4 mg/dL (ref 8.4–10.5)
Chloride: 106 mEq/L (ref 96–112)
Creatinine, Ser: 1.41 mg/dL (ref 0.40–1.50)
GFR: 45.5 mL/min — ABNORMAL LOW (ref >60.00)
Glucose, Bld: 94 mg/dL (ref 70–99)
Potassium: 5.3 mEq/L — ABNORMAL HIGH (ref 3.5–5.1)
Sodium: 139 mEq/L (ref 135–145)

## 2021-04-09 LAB — IBC + FERRITIN
Ferritin: 31.5 ng/mL (ref 22.0–322.0)
Iron: 134 ug/dL (ref 42–165)
Saturation Ratios: 41.4 % (ref 20.0–50.0)
TIBC: 323.4 ug/dL (ref 250.0–450.0)
Transferrin: 231 mg/dL (ref 212.0–360.0)

## 2021-04-09 LAB — FOLATE: Folate: 7.3 ng/mL (ref >5.9)

## 2021-04-09 LAB — TSH: TSH: 0.81 u[IU]/mL (ref 0.35–5.50)

## 2021-04-09 LAB — VITAMIN B12: Vitamin B-12: 885 pg/mL (ref 211–911)

## 2021-04-09 NOTE — Patient Instructions (Signed)
Anemia Anemia is a condition in which there is not enough red blood cells or hemoglobin in the blood. Hemoglobin is a substance in red blood cells that carries oxygen. When you do not have enough red blood cells or hemoglobin (are anemic), your body cannot get enough oxygen and your organs may not work properly. As a result, you may feel very tired or have other problems. What are the causes? Common causes of anemia include: Excessive bleeding. Anemia can be caused by excessive bleeding inside or outside the body, including bleeding from the intestines or from heavy menstrual periods in females. Poor nutrition. Long-lasting (chronic) kidney, thyroid, and liver disease. Bone marrow disorders, spleen problems, and blood disorders. Cancer and treatments for cancer. HIV (human immunodeficiency virus) and AIDS (acquired immunodeficiency syndrome). Infections, medicines, and autoimmune disorders that destroy red blood cells. What are the signs or symptoms? Symptoms of this condition include: Minor weakness. Dizziness. Headache, or difficulties concentrating and sleeping. Heartbeats that feel irregular or faster than normal (palpitations). Shortness of breath, especially with exercise. Pale skin, lips, and nails, or cold hands and feet. Indigestion and nausea. Symptoms may occur suddenly or develop slowly. If your anemia is mild, you may not have symptoms. How is this diagnosed? This condition is diagnosed based on blood tests, your medical history, and a physical exam. In some cases, a test may be needed in which cells are removed from the soft tissue inside of a bone and looked at under a microscope (bone marrow biopsy). Your health care provider may also check your stool (feces) for blood and may do additional testing to look for the cause of your bleeding. Other tests may include: Imaging tests, such as a CT scan or MRI. A procedure to see inside your esophagus and stomach (endoscopy). A  procedure to see inside your colon and rectum (colonoscopy). How is this treated? Treatment for this condition depends on the cause. If you continue to lose a lot of blood, you may need to be treated at a hospital. Treatment may include: Taking supplements of iron, vitamin B12, or folic acid. Taking a hormone medicine (erythropoietin) that can help to stimulate red blood cell growth. Having a blood transfusion. This may be needed if you lose a lot of blood. Making changes to your diet. Having surgery to remove your spleen. Follow these instructions at home: Take over-the-counter and prescription medicines only as told by your health care provider. Take supplements only as told by your health care provider. Follow any diet instructions that you were given by your health care provider. Keep all follow-up visits as told by your health care provider. This is important. Contact a health care provider if: You develop new bleeding anywhere in the body. Get help right away if: You are very weak. You are short of breath. You have pain in your abdomen or chest. You are dizzy or feel faint. You have trouble concentrating. You have bloody stools, black stools, or tarry stools. You vomit repeatedly or you vomit up blood. These symptoms may represent a serious problem that is an emergency. Do not wait to see if the symptoms will go away. Get medical help right away. Call your local emergency services (911 in the [place] Do not drive yourself to the hospital. Summary Anemia is a condition in which you do not have enough red blood cells or enough of a substance in your red blood cells that carries oxygen (hemoglobin). Symptoms may occur suddenly or develop slowly. If your anemia is   mild, you may not have symptoms. This condition is diagnosed with blood tests, a medical history, and a physical exam. Other tests may be needed. Treatment for this condition depends on the cause of the anemia. This  information is not intended to replace advice given to you by your health care provider. Make sure you discuss any questions you have with your health care provider. Document Revised: 04/25/2019 Document Reviewed: 04/25/2019 Elsevier Patient Education  2022 Elsevier Inc.  

## 2021-04-09 NOTE — Progress Notes (Signed)
Subjective:  Patient ID: [name], male    DOB: Mar 31, 1934  Age: 87 y.o. MRN: [id number]  CC: Anemia  This visit occurred during the SARS-CoV-2 public health emergency.  Safety protocols were in place, including screening questions prior to the visit, additional usage of staff PPE, and extensive cleaning of exam room while observing appropriate contact time as indicated for disinfecting solutions.    HPI [name] presents for f/up and to establish.  He wants a new PCP. He has an abrasion on his RLE that is slowly healing. There is no pain, redness, drainage.  History [name] has a past medical history of Atrial flutter ([place]), AUTOMATIC IMPLANTABLE CARDIAC DEFIBRILLATOR SITU, Automatic implantable cardioverter-defibrillator in situ (12/05/2009), CAD (coronary artery disease) (05/22/2011), Carotid stenosis (05/22/2011), CHF CONGESTIVE HEART FAILURE, COPD (12/01/2007), DM2 (diabetes mellitus, type 2) ([place]), DYSLIPIDEMIA, DYSPNEA, Edema, Essential hypertension (11/17/2007), Gout, HYPERTENSION, HYPOTHYROIDISM, MYOCARDIAL INFARCTION, S/P updgrade to CRT-D (11/23/2020), and WEIGHT GAIN, ABNORMAL.   He has a past surgical history that includes Cardiac defibrillator placement; Angioplasty; Appendectomy (1957); Eye surgery (1985); Carotid stent; Lower Extremity Angiography (N/A, 05/04/2018); PERIPHERAL VASCULAR INTERVENTION (Bilateral, 05/04/2018); and BIV UPGRADE (N/A, 11/22/2020).   His family history includes Cancer in his brother; Kidney disease in his father; Other in his mother.He reports that he quit smoking about 37 years ago. His smoking use included cigarettes. He has never used smokeless tobacco. He reports that he does not drink alcohol and does not use drugs.  Outpatient Medications Prior to Visit  Medication Sig Dispense Refill   ACCU-CHEK AVIVA PLUS test strip      acetaminophen (TYLENOL) 650 MG CR tablet Take 650 mg by mouth every 8 (eight) hours as needed for pain.     aspirin EC 81  MG tablet Take 81 mg by mouth in the morning. Swallow whole.     atorvastatin (LIPITOR) 40 MG tablet Take 1 tablet (40 mg total) by mouth in the morning. 90 tablet 3   clopidogrel (PLAVIX) 75 MG tablet Take 1 tablet (75 mg total) by mouth in the morning.     FLOVENT HFA 110 MCG/ACT inhaler Inhale 2 puffs into the lungs 2 (two) times daily.  3   fluticasone (FLONASE) 50 MCG/ACT nasal spray Place 2 sprays into the nose in the morning.     furosemide (LASIX) 20 MG tablet Take 1 tablet (20 mg total) by mouth in the morning. 90 tablet 3   ipratropium-albuterol (DUONEB) 0.5-2.5 (3) MG/3ML SOLN Take 3 mLs by nebulization every 6 (six) hours as needed (wheezing/shortness of breath).     metoprolol (TOPROL-XL) 200 MG 24 hr tablet Take 0.5 tablets (100 mg total) by mouth in the morning and at bedtime. 90 tablet 3   mirtazapine (REMERON) 30 MG tablet Take 30 mg by mouth at bedtime.     montelukast (SINGULAIR) 10 MG tablet Take 10 mg by mouth daily.  12   pantoprazole (PROTONIX) 40 MG tablet Take 40 mg by mouth 2 (two) times daily. Morning & Evening     Polyethyl Glycol-Propyl Glycol (SYSTANE OP) Place 1 drop into both eyes in the morning and at bedtime.     SYNTHROID 200 MCG tablet Take 200 mcg by mouth in the morning.     vitamin B-12 (CYANOCOBALAMIN) 1000 MCG tablet Take 1,000 mcg by mouth in the morning.     losartan (COZAAR) 25 MG tablet Take 1 tablet (25 mg total) by mouth in the morning and at bedtime. 90 tablet 3  No facility-administered medications prior to visit.    ROS Review of Systems  Constitutional:  Negative for chills, diaphoresis, fatigue and fever.  HENT: Negative.    Eyes: Negative.   Respiratory:  Negative for cough, chest tightness, shortness of breath and wheezing.   Cardiovascular:  Negative for chest pain, palpitations and leg swelling.  Gastrointestinal:  Negative for abdominal pain, constipation, diarrhea, nausea and vomiting.  Endocrine: Negative.   Genitourinary:  Negative.  Negative for difficulty urinating.  Musculoskeletal: Negative.   Skin:  Positive for wound.  Neurological:  Negative for dizziness, weakness, light-headedness and headaches.  Hematological:  Negative for adenopathy. Does not bruise/bleed easily.  Psychiatric/Behavioral: Negative.     Objective:  BP (!) 144/82 (BP Location: Left Arm, Patient Position: Sitting, Cuff Size: Large)   Pulse (!) 58   Temp 98.1 F (36.7 C) (Oral)   Ht 5\' 8"  (1.727 m)   Wt 159 lb (72.1 kg)   SpO2 92%   BMI 24.18 kg/m   Physical Exam Vitals reviewed.  HENT:     Nose: Nose normal.     Mouth/Throat:     Mouth: Mucous membranes are moist.  Eyes:     General: No scleral icterus.    Conjunctiva/sclera: Conjunctivae normal.  Cardiovascular:     Rate and Rhythm: Normal rate and regular rhythm.     Heart sounds: No murmur heard. Pulmonary:     Effort: Pulmonary effort is normal.     Breath sounds: No stridor. No wheezing, rhonchi or rales.  Abdominal:     General: Abdomen is flat.     Palpations: There is no mass.     Tenderness: There is no abdominal tenderness. There is no guarding.     Hernia: No hernia is present.  Musculoskeletal:        General: Normal range of motion.     Cervical back: Neck supple.     Right lower leg: No edema.     Left lower leg: No edema.       Legs:  Lymphadenopathy:     Cervical: No cervical adenopathy.  Skin:    General: Skin is warm and dry.  Neurological:     General: No focal deficit present.     Mental Status: He is alert.    Lab Results  Component Value Date   WBC 8.5 04/09/2021   HGB 10.3 (L) 04/09/2021   HCT 31.5 (L) 04/09/2021   PLT 211.0 04/09/2021   GLUCOSE 94 04/09/2021   CHOL 92 (L) 02/06/2021   TRIG 62 02/06/2021   HDL 40 02/06/2021   LDLCALC 38 02/06/2021   ALT 6 02/06/2021   AST 17 02/06/2021   NA 139 04/09/2021   K 5.3 No hemolysis seen (H) 04/09/2021   CL 106 04/09/2021   CREATININE 1.41 04/09/2021   BUN 36 (H) 04/09/2021    CO2 28 04/09/2021   TSH 0.81 04/09/2021   INR 1.25 08/29/2010     Assessment & Plan:   [name] was seen today for anemia.  Diagnoses and all orders for this visit:  Essential hypertension- His K+ is mildly elevated. Will discontinue the ARB. -     CBC with Differential/Platelet; Future -     Basic metabolic panel; Future -     TSH; Future -     TSH -     Basic metabolic panel -     CBC with Differential/Platelet  Deficiency anemia- Will screen for vitamin deficiencies. -     CBC  with Differential/Platelet; Future -     Vitamin B12; Future -     IBC + Ferritin; Future -     Reticulocytes; Future -     Vitamin B1; Future -     Folate; Future -     Folate -     Vitamin B1 -     Reticulocytes -     IBC + Ferritin -     Vitamin B12 -     CBC with Differential/Platelet  I have discontinued Keaundre W. Papin's losartan. I am also having him maintain his Accu-Chek Aviva Plus, fluticasone, Flovent HFA, montelukast, Polyethyl Glycol-Propyl Glycol (SYSTANE OP), pantoprazole, mirtazapine, Synthroid, vitamin B-12, aspirin EC, ipratropium-albuterol, acetaminophen, clopidogrel, metoprolol, furosemide, and atorvastatin.  No orders of the defined types were placed in this encounter.    Follow-up: Return in about 3 months (around 07/10/2021).  [name], MD

## 2021-04-11 DIAGNOSIS — L02213 Cutaneous abscess of chest wall: Secondary | ICD-10-CM | POA: Diagnosis not present

## 2021-04-12 LAB — RETICULOCYTES
ABS Retic: 26160 cells/uL (ref 25000–90000)
Retic Ct Pct: 0.8 %

## 2021-04-12 LAB — VITAMIN B1: Vitamin B1 (Thiamine): 12 nmol/L (ref 8–30)

## 2021-04-13 ENCOUNTER — Encounter: Payer: Self-pay | Admitting: Internal Medicine

## 2021-04-14 DIAGNOSIS — L02213 Cutaneous abscess of chest wall: Secondary | ICD-10-CM | POA: Diagnosis not present

## 2021-04-14 DIAGNOSIS — L0100 Impetigo, unspecified: Secondary | ICD-10-CM | POA: Diagnosis not present

## 2021-04-15 DIAGNOSIS — L02213 Cutaneous abscess of chest wall: Secondary | ICD-10-CM | POA: Diagnosis not present

## 2021-04-18 DIAGNOSIS — L0100 Impetigo, unspecified: Secondary | ICD-10-CM | POA: Diagnosis not present

## 2021-04-29 ENCOUNTER — Telehealth: Payer: Self-pay | Admitting: Internal Medicine

## 2021-04-29 ENCOUNTER — Other Ambulatory Visit: Payer: Self-pay | Admitting: Internal Medicine

## 2021-04-29 DIAGNOSIS — I6523 Occlusion and stenosis of bilateral carotid arteries: Secondary | ICD-10-CM

## 2021-04-29 DIAGNOSIS — I739 Peripheral vascular disease, unspecified: Secondary | ICD-10-CM

## 2021-04-29 MED ORDER — CLOPIDOGREL BISULFATE 75 MG PO TABS: 75.0000 mg | ORAL_TABLET | Freq: Every morning | ORAL | 1 refills | Status: DC

## 2021-04-29 NOTE — Progress Notes (Signed)
Cardiology Office Note   Date:  05/01/2021   ID:  [name], DOB 02-05-1934, MRN [id number]  PCP:  Janith Lima, MD  Cardiologist:   [name], MD   Chief Complaint  Patient presents with   Shortness of Breath        History of Present Illness: [name] is a 87 y.o. male who presents for followup of his coronary disease and cardiomyopathy.   At the last visit his EF was lower than previous at 25% and I started [place].  However, he developed significant hypotension and has not tolerated this and has been off that med.  A repeat echo was unchanged at 25%.   He had a CRT D upgrade and was seen by Dr. [name] in October.  I reviewed these records for this visit.    He had normal device function.     Since I last saw him he has done well from a cardiac standpoint.  He was walking a lot more but then he hit his leg and got an injury on his right pretibial area and he has been nursing this.  He also had apparently infection staff on his right upper chest that popped up spontaneously.  He has been managing these and seeing the dermatologist.  Denies any cardiovascular symptoms such as chest pressure, neck or arm discomfort.  He had no palpitations, presyncope or syncope.  He does think his breathing got better after his device upgrade.  Of note he has had some increased creatinine and hyperkalemia and is off the Cozaar.   Past Medical History:  Diagnosis Date   Atrial flutter ([place])    (I could not find documentation of this rhythm.)   AUTOMATIC IMPLANTABLE CARDIAC DEFIBRILLATOR SITU    Automatic implantable cardioverter-defibrillator in situ 12/05/2009   Qualifier: Diagnosis of  By: [name], MD, [facility], Binnie Kand    CAD (coronary artery disease) 05/22/2011   Carotid stenosis 05/22/2011   CHF CONGESTIVE HEART FAILURE    45% by echo 2015   COPD 12/01/2007   Qualifier: Diagnosis of  By: [name] NP, [name]     DM2 (diabetes mellitus, type 2) ([place])    DYSLIPIDEMIA     DYSPNEA    Edema    Essential hypertension 11/17/2007   Qualifier: Diagnosis of  By: [name]     Gout    HYPERTENSION    HYPOTHYROIDISM    MYOCARDIAL INFARCTION    MI 1999, stent x 2 to RCA, 70% circ, 30 and 40 % LADs   S/P updgrade to CRT-D 11/23/2020   WEIGHT GAIN, ABNORMAL     Past Surgical History:  Procedure Laterality Date   ANGIOPLASTY     stent   APPENDECTOMY  1957   BIV UPGRADE N/A 11/22/2020   Procedure: BIV ICD UPGRADE;  Surgeon: Evans Lance, MD;  Location: [place] [facility];  Service: Cardiovascular;  Laterality: N/A;   CARDIAC DEFIBRILLATOR PLACEMENT     CAROTID STENT     EYE SURGERY  1985   LOWER EXTREMITY ANGIOGRAPHY N/A 05/04/2018   Procedure: LOWER EXTREMITY ANGIOGRAPHY;  Surgeon: [name], MD;  Location: [place] [facility];  Service: Cardiovascular;  Laterality: N/A;   PERIPHERAL VASCULAR INTERVENTION Bilateral 05/04/2018   Procedure: PERIPHERAL VASCULAR INTERVENTION;  Surgeon: [name], MD;  Location: [place] [facility];  Service: Cardiovascular;  Laterality: Bilateral;  [place]     Current Outpatient Medications  Medication Sig Dispense Refill  ACCU-CHEK AVIVA PLUS test strip      acetaminophen (TYLENOL) 650 MG CR tablet Take 650 mg by mouth every 8 (eight) hours as needed for pain.     aspirin EC 81 MG tablet Take 81 mg by mouth in the morning. Swallow whole.     atorvastatin (LIPITOR) 40 MG tablet Take 1 tablet (40 mg total) by mouth in the morning. 90 tablet 3   clopidogrel (PLAVIX) 75 MG tablet Take 1 tablet (75 mg total) by mouth in the morning. 90 tablet 1   FLOVENT HFA 110 MCG/ACT inhaler Inhale 2 puffs into the lungs 2 (two) times daily.  3   fluticasone (FLONASE) 50 MCG/ACT nasal spray Place 2 sprays into the nose in the morning.     ipratropium-albuterol (DUONEB) 0.5-2.5 (3) MG/3ML SOLN Take 3 mLs by nebulization every 6 (six) hours as needed (wheezing/shortness of breath).     metoprolol (TOPROL-XL) 200 MG 24 hr  tablet Take 0.5 tablets (100 mg total) by mouth in the morning and at bedtime. 90 tablet 3   mirtazapine (REMERON) 30 MG tablet Take 30 mg by mouth at bedtime.     montelukast (SINGULAIR) 10 MG tablet Take 10 mg by mouth daily.  12   mupirocin ointment (BACTROBAN) 2 % Apply topically daily.     pantoprazole (PROTONIX) 40 MG tablet Take 40 mg by mouth 2 (two) times daily. Morning & Evening     Polyethyl Glycol-Propyl Glycol (SYSTANE OP) Place 1 drop into both eyes in the morning and at bedtime.     SYNTHROID 200 MCG tablet Take 200 mcg by mouth in the morning.     vitamin B-12 (CYANOCOBALAMIN) 1000 MCG tablet Take 1,000 mcg by mouth in the morning.     doxycycline (VIBRA-TABS) 100 MG tablet Take 100 mg by mouth 2 (two) times daily. (Patient not taking: Reported on 05/01/2021)     furosemide (LASIX) 20 MG tablet Take 1 tablet (20 mg total) by mouth in the morning. 90 tablet 3   No current facility-administered medications for this visit.    Allergies:   Prednisone and Sulfa antibiotics    ROS:  Please see the history of present illness.   Otherwise, review of systems are positive for none.   All other systems are reviewed and negative.    PHYSICAL EXAM: VS:  BP (!) 170/64   Pulse (!) 48   Ht 5\' 8"  (1.727 m)   Wt 164 lb 6.4 oz (74.6 kg)   SpO2 98%   BMI 25.00 kg/m  , BMI Body mass index is 25 kg/m. GENERAL:  Well appearing NECK:  No jugular venous distention, waveform within normal limits, carotid upstroke brisk and symmetric, no bruits, no thyromegaly LUNGS:  Clear to auscultation bilaterally CHEST: Well-healed device pocket HEART:  PMI not displaced or sustained,S1 and S2 within normal limits, no S3, no S4, no clicks, no rubs, no murmurs ABD:  Flat, positive bowel sounds normal in frequency in pitch, no bruits, no rebound, no guarding, no midline pulsatile mass, no hepatomegaly, no splenomegaly EXT:  2 plus pulses throughout, mild leg edema with bandage on the right leg edema, no  cyanosis no clubbing   EKG:  EKG is not ordered today. NA  Recent Labs: 07/26/2020: BNP 880.1 04/09/2021: BUN 36; Creatinine, Ser 1.41; Hemoglobin 10.3; Platelets 211.0; Potassium 5.3 No hemolysis seen; Sodium 139; TSH 0.81 04/30/2021: ALT 10    Lipid Panel    Component Value Date/Time   CHOL 112 04/30/2021 1304  TRIG 69 04/30/2021 1304   HDL 41 04/30/2021 1304   CHOLHDL 2.7 04/30/2021 1304   CHOLHDL 4 06/22/2011 0925   VLDL 25.0 06/22/2011 0925   LDLCALC 56 04/30/2021 1304      Wt Readings from Last 3 Encounters:  05/01/21 164 lb 6.4 oz (74.6 kg)  04/09/21 159 lb (72.1 kg)  03/04/21 170 lb (77.1 kg)      Other studies Reviewed: Additional studies/ records that were reviewed today include:  Labs Review of the above records demonstrates: See elsewhere   ASSESSMENT AND PLAN:  CAD:  The patient has no new sypmtoms.  No further cardiovascular testing is indicated.  We will continue with aggressive risk reduction and meds as listed.  HTN:    His blood pressure is elevated which is unusual.  He is going to keep a blood pressure diary.    If his blood pressure is up, knowing that he cannot tolerate ACE inhibitor or ARB, I am going to probably prescribe hydralazine nitrates.  Previously his blood pressure had actually been pretty low.  DYSLIPIDEMIA:    His LDL was 38 with an HDL of 40.  He will continue the meds as listed.  These were labs done just a couple of days ago.  CARDIOMYOPATHY:    The plan is to try to titrate with hydralazine nitrates if blood pressure allows.   CRT D:    He is up to date with follow up.    Current medicines are reviewed at length with the patient today.  The patient does not have concerns regarding medicines.  The following changes have been made: None  Labs/ tests ordered today include:    No orders of the defined types were placed in this encounter.     Disposition:   FU with APP in 3 months.    Signed, [name], MD   05/01/2021 12:17 PM    [facility]

## 2021-04-29 NOTE — Telephone Encounter (Signed)
Patient's daughter Mardene Celeste requesting a new rx for  clopidogrel (PLAVIX) 75 MG tablet by provider  Caller states pharmacy will not fill rx due to prescribing provider no longer being patient's pcp  Patient was last seen 04-09-2021  Pharmacy [id number] - [place], [place]

## 2021-04-30 DIAGNOSIS — E785 Hyperlipidemia, unspecified: Secondary | ICD-10-CM | POA: Diagnosis not present

## 2021-05-01 ENCOUNTER — Other Ambulatory Visit: Payer: Self-pay

## 2021-05-01 ENCOUNTER — Ambulatory Visit: Payer: Medicare Other | Admitting: Cardiology

## 2021-05-01 ENCOUNTER — Encounter: Payer: Self-pay | Admitting: Cardiology

## 2021-05-01 VITALS — BP 170/64 | HR 48 | Ht 68.0 in | Wt 164.4 lb

## 2021-05-01 DIAGNOSIS — E785 Hyperlipidemia, unspecified: Secondary | ICD-10-CM | POA: Diagnosis not present

## 2021-05-01 DIAGNOSIS — I255 Ischemic cardiomyopathy: Secondary | ICD-10-CM | POA: Diagnosis not present

## 2021-05-01 DIAGNOSIS — I10 Essential (primary) hypertension: Secondary | ICD-10-CM

## 2021-05-01 DIAGNOSIS — I2510 Atherosclerotic heart disease of native coronary artery without angina pectoris: Secondary | ICD-10-CM | POA: Diagnosis not present

## 2021-05-01 LAB — LIPID PANEL
Chol/HDL Ratio: 2.7 ratio (ref 0.0–5.0)
Cholesterol, Total: 112 mg/dL (ref 100–199)
HDL: 41 mg/dL (ref >39)
LDL Chol Calc (NIH): 56 mg/dL (ref 0–99)
Triglycerides: 69 mg/dL (ref 0–149)
VLDL Cholesterol Cal: 15 mg/dL (ref 5–40)

## 2021-05-01 LAB — HEPATIC FUNCTION PANEL
ALT: 10 IU/L (ref 0–44)
AST: 21 IU/L (ref 0–40)
Albumin: 3.8 g/dL (ref 3.6–4.6)
Alkaline Phosphatase: 48 IU/L (ref 44–121)
Bilirubin Total: 0.6 mg/dL (ref 0.0–1.2)
Bilirubin, Direct: 0.22 mg/dL (ref 0.00–0.40)
Total Protein: 7.2 g/dL (ref 6.0–8.5)

## 2021-05-01 NOTE — Patient Instructions (Signed)
Medication Instructions:  Your Physician recommend you continue on your current medication as directed.    *If you need a refill on your cardiac medications before your next appointment, please call your pharmacy*   Lab Work: None ordered today   Testing/Procedures: None ordered today   Follow-Up: At [facility], you and your health needs are our priority.  As part of our continuing mission to provide you with exceptional heart care, we have created designated Provider Care Teams.  These Care Teams include your primary Cardiologist (physician) and Advanced Practice Providers (APPs -  Physician Assistants and Nurse Practitioners) who all work together to provide you with the care you need, when you need it.  We recommend signing up for the patient portal called "MyChart".  Sign up information is provided on this After Visit Summary.  MyChart is used to connect with patients for Virtual Visits (Telemedicine).  Patients are able to view lab/test results, encounter notes, upcoming appointments, etc.  Non-urgent messages can be sent to your provider as well.   To learn more about what you can do with MyChart, go to [email or web address].    Your next appointment:   3 month(s)  The format for your next appointment:   In Person  Provider:   APP

## 2021-05-02 ENCOUNTER — Telehealth: Payer: Self-pay

## 2021-05-02 NOTE — Telephone Encounter (Addendum)
Spoke with patients daughter Mardene Celeste regarding blood test.----- Message from [name], NP sent at 05/01/2021  4:08 PM EST ----- Excellent control of cholesterol. Liver enzymes are normal. Great report! No changes in regimen. Please make sure his PCP has these results.  Thank you.  KL

## 2021-05-20 DIAGNOSIS — C4442 Squamous cell carcinoma of skin of scalp and neck: Secondary | ICD-10-CM | POA: Diagnosis not present

## 2021-05-20 DIAGNOSIS — L570 Actinic keratosis: Secondary | ICD-10-CM | POA: Diagnosis not present

## 2021-05-23 ENCOUNTER — Ambulatory Visit (INDEPENDENT_AMBULATORY_CARE_PROVIDER_SITE_OTHER): Payer: Medicare Other

## 2021-05-23 DIAGNOSIS — I255 Ischemic cardiomyopathy: Secondary | ICD-10-CM

## 2021-05-23 LAB — CUP PACEART REMOTE DEVICE CHECK
Battery Remaining Longevity: 126 mo
Battery Remaining Percentage: 100 %
Brady Statistic RA Percent Paced: 6 %
Brady Statistic RV Percent Paced: 100 %
Date Time Interrogation Session: 20221223024100
HighPow Impedance: 47 Ohm
Implantable Lead Implant Date: 20020206
Implantable Lead Implant Date: 20071005
Implantable Lead Implant Date: 20220624
Implantable Lead Implant Date: 20220624
Implantable Lead Location: 753858
Implantable Lead Location: 753859
Implantable Lead Location: 753860
Implantable Lead Location: 753860
Implantable Lead Model: 148
Implantable Lead Model: 4088
Implantable Lead Model: 4677
Implantable Lead Model: 7841
Implantable Lead Serial Number: 1073796
Implantable Lead Serial Number: 109512
Implantable Lead Serial Number: 224392
Implantable Lead Serial Number: 824442
Implantable Pulse Generator Implant Date: 20220624
Lead Channel Impedance Value: 473 Ohm
Lead Channel Impedance Value: 583 Ohm
Lead Channel Impedance Value: 862 Ohm
Lead Channel Pacing Threshold Amplitude: 0.6 V
Lead Channel Pacing Threshold Pulse Width: 0.4 ms
Lead Channel Setting Pacing Amplitude: 2 V
Lead Channel Setting Pacing Amplitude: 2.5 V
Lead Channel Setting Pacing Amplitude: 3 V
Lead Channel Setting Pacing Pulse Width: 0.4 ms
Lead Channel Setting Pacing Pulse Width: 1.2 ms
Lead Channel Setting Sensing Sensitivity: 0.5 mV
Lead Channel Setting Sensing Sensitivity: 1 mV
Pulse Gen Serial Number: 385552

## 2021-06-03 NOTE — Progress Notes (Signed)
Remote ICD transmission.   

## 2021-06-11 ENCOUNTER — Other Ambulatory Visit: Payer: Self-pay

## 2021-06-11 ENCOUNTER — Ambulatory Visit (INDEPENDENT_AMBULATORY_CARE_PROVIDER_SITE_OTHER): Payer: Medicare Other | Admitting: Emergency Medicine

## 2021-06-11 ENCOUNTER — Encounter: Payer: Self-pay | Admitting: Emergency Medicine

## 2021-06-11 VITALS — BP 172/70 | HR 63 | Ht 68.0 in | Wt 174.0 lb

## 2021-06-11 DIAGNOSIS — S81811A Laceration without foreign body, right lower leg, initial encounter: Secondary | ICD-10-CM | POA: Diagnosis not present

## 2021-06-11 NOTE — Patient Instructions (Signed)
Wound Care, Adult ?Taking care of your wound properly can help to prevent pain, infection, and scarring. It can also help your wound heal more quickly. Follow instructions from your health care provider about how to care for your wound. ?Supplies needed: ?Soap and water. ?Wound cleanser, saline, or germ-free (sterile) water. ?Gauze. ?If needed, a clean bandage (dressing) or other type of wound dressing material to cover or place in the wound. Follow your health care provider's instructions about what dressing supplies to use. ?Cream or topical ointment to apply to the wound, if told by your health care provider. ?How to care for your wound ?Cleaning the wound ?Ask your health care provider how to clean the wound. This may include: ?Using mild soap and water, a wound cleanser, saline, or sterile water. ?Using a clean gauze to pat the wound dry after cleaning it. Do not rub or scrub the wound. ?Dressing care ?Wash your hands with soap and water for at least 20 seconds before and after you change the dressing. If soap and water are not available, use hand sanitizer. ?Change your dressing as told by your health care provider. This may include: ?Cleaning or rinsing out (irrigating) the wound. ?Application of cream or topical ointment, if told by your health care provider. ?Placing a dressing over the wound or in the wound (packing). ?Covering the wound with an outer dressing. ?Leave stitches (sutures), staples, skin glue, or adhesive strips in place. These skin closures may need to stay in place for 2 weeks or longer. If adhesive strip edges start to loosen and curl up, you may trim the loose edges. Do not remove adhesive strips completely unless your health care provider tells you to do that. ?Ask your health care provider when you can leave the wound uncovered. ?Checking for infection ?Check your wound area every day for signs of infection. Check for: ?More redness, swelling, or pain. ?Fluid or blood. ?Warmth. ?Pus or  a bad smell. ? ?Follow these instructions at home ?Medicines ?If you were prescribed an antibiotic medicine, cream, or ointment, take or apply it as told by your health care provider. Do not stop using the antibiotic even if your condition improves. ?If you were prescribed pain medicine, take it 30 minutes before you do any wound care or as told by your health care provider. ?Take over-the-counter and prescription medicines only as told by your health care provider. ?Eating and drinking ?Eat a diet that includes protein, vitamin A, vitamin C, and other nutrient-rich foods to help the wound heal. ?Foods rich in protein include meat, fish, eggs, dairy, beans, and nuts. ?Foods rich in vitamin A include carrots and dark green, leafy vegetables. ?Foods rich in vitamin C include citrus fruits, tomatoes, broccoli, and peppers. ?Drink enough fluid to keep your urine pale yellow. ?General instructions ?Do not take baths, swim, or use a hot tub until your health care provider approves. Ask your health care provider if you may take showers. You may only be allowed to take sponge baths. ?Do not scratch or pick at the wound. Keep it covered as told by your health care provider. ?Return to your normal activities as told by your health care provider. Ask your health care provider what activities are safe for you. ?Protect your wound from the sun when you are outside for the first 6 months, or for as long as told by your health care provider. Cover up the scar area or apply sunscreen that has an SPF of at least 30. ?Do not   use any products that contain nicotine or tobacco. These products include cigarettes, chewing tobacco, and vaping devices, such as e-cigarettes. If you need help quitting, ask your health care provider. ?Keep all follow-up visits. This is important. ?Contact a health care provider if: ?You received a tetanus shot and you have swelling, severe pain, redness, or bleeding at the injection site. ?Your pain is not  controlled with medicine. ?You have any of these signs of infection: ?More redness, swelling, or pain around the wound. ?Fluid or blood coming from the wound. ?Warmth coming from the wound. ?A fever or chills. ?You are nauseous or you vomit. ?You are dizzy. ?You have a new rash or hardness around the wound. ?Get help right away if: ?You have a red streak of skin near the area around your wound. ?Pus or a bad smell coming from the wound. ?Your wound has been closed with staples, sutures, skin glue, or adhesive strips and it begins to open up and separate. ?Your wound is bleeding, and the bleeding does not stop with gentle pressure. ?These symptoms may represent a serious problem that is an emergency. Do not wait to see if the symptoms will go away. Get medical help right away. Call your local emergency services (911 in the [place] Do not drive yourself to the hospital. ?Summary ?Always wash your hands with soap and water for at least 20 seconds before and after changing your dressing. ?Change your dressing as told by your health care provider. ?To help with healing, eat foods that are rich in protein, vitamin A, vitamin C, and other nutrients. ?Check your wound every day for signs of infection. Contact your health care provider if you think that your wound is infected. ?This information is not intended to replace advice given to you by your health care provider. Make sure you discuss any questions you have with your health care provider. ?Document Revised: 09/24/2020 Document Reviewed: 09/24/2020 ?Elsevier Patient Education ? 2022 Elsevier Inc[employer] ? ?

## 2021-06-11 NOTE — Progress Notes (Signed)
[name] 88 y.o.   Chief Complaint  Patient presents with   Fall    Pt was in the shower and fell and cut right leg, brusing and swelling, x saturday    HISTORY OF PRESENT ILLNESS: Acute problem visit today. This is a 88 y.o. male complaining of right lower leg laceration sustained in the shower 3 days ago. On Plavix.  No other injuries.  No syncopal episode.  Up-to-date with tetanus vaccination.  Fall Pertinent negatives include no abdominal pain, fever, headaches, nausea or vomiting.    Prior to Admission medications   Medication Sig Start Date End Date Taking? Authorizing Provider  ACCU-CHEK AVIVA PLUS test strip  07/12/10  Yes [provider]  acetaminophen (TYLENOL) 650 MG CR tablet Take 650 mg by mouth every 8 (eight) hours as needed for pain.   Yes [provider]  aspirin EC 81 MG tablet Take 81 mg by mouth in the morning. Swallow whole.   Yes [provider]  atorvastatin (LIPITOR) 40 MG tablet Take 1 tablet (40 mg total) by mouth in the morning. 01/24/21  Yes [name], NP  clopidogrel (PLAVIX) 75 MG tablet Take 1 tablet (75 mg total) by mouth in the morning. 04/29/21  Yes Janith Lima, MD  doxycycline (VIBRA-TABS) 100 MG tablet Take 100 mg by mouth 2 (two) times daily. 04/11/21  Yes [provider]  FLOVENT HFA 110 MCG/ACT inhaler Inhale 2 puffs into the lungs 2 (two) times daily. 03/25/15  Yes [provider]  fluticasone (FLONASE) 50 MCG/ACT nasal spray Place 2 sprays into the nose in the morning.   Yes [provider]  ipratropium-albuterol (DUONEB) 0.5-2.5 (3) MG/3ML SOLN Take 3 mLs by nebulization every 6 (six) hours as needed (wheezing/shortness of breath).   Yes [provider]  metoprolol (TOPROL-XL) 200 MG 24 hr tablet Take 0.5 tablets (100 mg total) by mouth in the morning and at bedtime. 01/24/21  Yes [name], NP  mirtazapine (REMERON) 30 MG tablet Take 30 mg by mouth at  bedtime. 05/31/20  Yes [provider]  montelukast (SINGULAIR) 10 MG tablet Take 10 mg by mouth daily. 02/05/17  Yes [provider]  mupirocin ointment (BACTROBAN) 2 % Apply topically daily. 04/11/21  Yes [provider]  pantoprazole (PROTONIX) 40 MG tablet Take 40 mg by mouth 2 (two) times daily. Morning & Evening 08/04/18  Yes [provider]  Polyethyl Glycol-Propyl Glycol (SYSTANE OP) Place 1 drop into both eyes in the morning and at bedtime.   Yes [provider]  SYNTHROID 200 MCG tablet Take 200 mcg by mouth in the morning. 08/30/20  Yes [provider]  vitamin B-12 (CYANOCOBALAMIN) 1000 MCG tablet Take 1,000 mcg by mouth in the morning.   Yes [provider]  furosemide (LASIX) 20 MG tablet Take 1 tablet (20 mg total) by mouth in the morning. 01/24/21 04/24/21  [name], NP    Allergies  Allergen Reactions   Prednisone Rash   Sulfa Antibiotics Other (See Comments)    Hyper     Patient Active Problem List   Diagnosis Date Noted   Deficiency anemia 04/09/2021   HFrEF (heart failure with reduced ejection fraction) ([place]) 11/22/2020   Medication management 06/14/2018   PAD (peripheral artery disease) ([place]) 04/05/2018   Diet-controlled diabetes mellitus ([place]) 02/01/2018   Diminished pulses in lower extremity 02/01/2018   Hashimoto's thyroiditis 03/08/2017   Ischemic cardiomyopathy 03/08/2017   Coronary artery disease  03/08/2017   Hypertension 03/08/2017   Blepharitis of upper and lower eyelids of both eyes 12/16/2016   Hypertropia of right eye 12/16/2016   Hypotropia of left eye 09/08/2016   Pseudophakia of both eyes 07/22/2016   Cataract extraction status 06/03/2016   Diplopia 04/06/2016   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   Ventricular tachycardia 04/19/2014   Carotid stenosis 05/22/2011   CAD (coronary artery disease) 05/22/2011   Automatic implantable cardioverter-defibrillator in situ 12/05/2009    Presence of automatic (implantable) cardiac defibrillator 12/05/2009   HYPOTHYROIDISM 04/29/2009   COPD 12/01/2007   Essential hypertension 11/17/2007   MYOCARDIAL INFARCTION 67/34/1937   Chronic systolic congestive heart failure ([place]) 11/17/2007    Past Medical History:  Diagnosis Date   Atrial flutter ([place])    (I could not find documentation of this rhythm.)   AUTOMATIC IMPLANTABLE CARDIAC DEFIBRILLATOR SITU    Automatic implantable cardioverter-defibrillator in situ 12/05/2009   Qualifier: Diagnosis of  By: [name], MD, [name]    CAD (coronary artery disease) 05/22/2011   Carotid stenosis 05/22/2011   CHF CONGESTIVE HEART FAILURE    45% by echo 2015   COPD 12/01/2007   Qualifier: Diagnosis of  By: [name] NP, [name]     DM2 (diabetes mellitus, type 2) ([place])    DYSLIPIDEMIA    DYSPNEA    Edema    Essential hypertension 11/17/2007   Qualifier: Diagnosis of  By: [name]     Gout    HYPERTENSION    HYPOTHYROIDISM    MYOCARDIAL INFARCTION    MI 1999, stent x 2 to RCA, 70% circ, 30 and 40 % LADs   S/P updgrade to CRT-D 11/23/2020   WEIGHT GAIN, ABNORMAL     Past Surgical History:  Procedure Laterality Date   ANGIOPLASTY     stent   APPENDECTOMY  1957   BIV UPGRADE N/A 11/22/2020   Procedure: BIV ICD UPGRADE;  Surgeon: Evans Lance, MD;  Location: [facility];  Service: Cardiovascular;  Laterality: N/A;   CARDIAC DEFIBRILLATOR PLACEMENT     CAROTID STENT     EYE SURGERY  1985   LOWER EXTREMITY ANGIOGRAPHY N/A 05/04/2018   Procedure: LOWER EXTREMITY ANGIOGRAPHY;  Surgeon: [name], MD;  Location: [facility];  Service: Cardiovascular;  Laterality: N/A;   PERIPHERAL VASCULAR INTERVENTION Bilateral 05/04/2018   Procedure: PERIPHERAL VASCULAR INTERVENTION;  Surgeon: [name], MD;  Location: [facility];  Service: Cardiovascular;  Laterality: Bilateral;  [place]    Social History   Socioeconomic History   Marital  status: Married    Spouse name: Ila   Number of children: 3   Years of education: Not on file   Highest education level: Not on file  Occupational History   Not on file  Tobacco Use   Smoking status: Former    Types: Cigarettes    Quit date: 12/10/1983    Years since quitting: 37.5   Smokeless tobacco: Never   Tobacco comments:    quit 1985  Substance and Sexual Activity   Alcohol use: No   Drug use: No   Sexual activity: Not on file  Other Topics Concern   Not on file  Social History Narrative   Not on file   Social Determinants of Health   Financial Resource Strain: Not on file  Food Insecurity: Not on file  Transportation Needs: Not on file  Physical Activity: Not on file  Stress: Not on file  Social  Connections: Not on file  Intimate Partner Violence: Not on file    Family History  Problem Relation Age of Onset   Other Mother        natural causes   Kidney disease Father    Cancer Brother        throat cancer     Review of Systems  Constitutional: Negative.  Negative for chills and fever.  HENT:  Negative for congestion and sore throat.   Respiratory:  Negative for cough and shortness of breath.   Cardiovascular: Negative.  Negative for chest pain and palpitations.  Gastrointestinal: Negative.  Negative for abdominal pain, diarrhea, nausea and vomiting.  Genitourinary: Negative.   Skin:        Laceration  Neurological: Negative.  Negative for dizziness and headaches.  Today's Vitals   06/11/21 1033  BP: (!) 172/70  Pulse: 63  SpO2: 94%  Weight: 174 lb (78.9 kg)  Height: 5\' 8"  (1.727 m)   Body mass index is 26.46 kg/m.   Physical Exam Vitals reviewed.  Constitutional:      Appearance: Normal appearance.  HENT:     Head: Normocephalic.  Eyes:     Extraocular Movements: Extraocular movements intact.  Cardiovascular:     Rate and Rhythm: Normal rate.  Pulmonary:     Effort: Pulmonary effort is normal.  Skin:    General: Skin is warm and  dry.     Comments: Positive laceration to right lower leg.  See picture below.  Neurological:     General: No focal deficit present.     Mental Status: He is alert and oriented to person, place, and time.  Psychiatric:        Mood and Affect: Mood normal.        Behavior: Behavior normal.   No crepitation.  No foreign bodies felt.  Fairly clean.   Laceration cleaned with hydrogen peroxide. Topical antibiotic applied followed by dry sterile dressing.   ASSESSMENT & PLAN: Problem List Items Addressed This Visit   None Visit Diagnoses     Laceration of right lower extremity, initial encounter    -  Primary   Relevant Orders   AMB referral to wound care center      Patient Instructions  Wound Care, Adult Taking care of your wound properly can help to prevent pain, infection, and scarring. It can also help your wound heal more quickly. Follow instructions from your health care provider about how to care for your wound. Supplies needed: Soap and water. Wound cleanser, saline, or germ-free (sterile) water. Gauze. If needed, a clean bandage (dressing) or other type of wound dressing material to cover or place in the wound. Follow your health care provider's instructions about what dressing supplies to use. Cream or topical ointment to apply to the wound, if told by your health care provider. How to care for your wound Cleaning the wound Ask your health care provider how to clean the wound. This may include: Using mild soap and water, a wound cleanser, saline, or sterile water. Using a clean gauze to pat the wound dry after cleaning it. Do not rub or scrub the wound. Dressing care Wash your hands with soap and water for at least 20 seconds before and after you change the dressing. If soap and water are not available, use hand sanitizer. Change your dressing as told by your health care provider. This may include: Cleaning or rinsing out (irrigating) the wound. Application of cream or  topical ointment,  if told by your health care provider. Placing a dressing over the wound or in the wound (packing). Covering the wound with an outer dressing. Leave stitches (sutures), staples, skin glue, or adhesive strips in place. These skin closures may need to stay in place for 2 weeks or longer. If adhesive strip edges start to loosen and curl up, you may trim the loose edges. Do not remove adhesive strips completely unless your health care provider tells you to do that. Ask your health care provider when you can leave the wound uncovered. Checking for infection Check your wound area every day for signs of infection. Check for: More redness, swelling, or pain. Fluid or blood. Warmth. Pus or a bad smell.  Follow these instructions at home Medicines If you were prescribed an antibiotic medicine, cream, or ointment, take or apply it as told by your health care provider. Do not stop using the antibiotic even if your condition improves. If you were prescribed pain medicine, take it 30 minutes before you do any wound care or as told by your health care provider. Take over-the-counter and prescription medicines only as told by your health care provider. Eating and drinking Eat a diet that includes protein, vitamin A, vitamin C, and other nutrient-rich foods to help the wound heal. Foods rich in protein include meat, fish, eggs, dairy, beans, and nuts. Foods rich in vitamin A include carrots and dark green, leafy vegetables. Foods rich in vitamin C include citrus fruits, tomatoes, broccoli, and peppers. Drink enough fluid to keep your urine pale yellow. General instructions Do not take baths, swim, or use a hot tub until your health care provider approves. Ask your health care provider if you may take showers. You may only be allowed to take sponge baths. Do not scratch or pick at the wound. Keep it covered as told by your health care provider. Return to your normal activities as told by  your health care provider. Ask your health care provider what activities are safe for you. Protect your wound from the sun when you are outside for the first 6 months, or for as long as told by your health care provider. Cover up the scar area or apply sunscreen that has an SPF of at least 40. Do not use any products that contain nicotine or tobacco. These products include cigarettes, chewing tobacco, and vaping devices, such as e-cigarettes. If you need help quitting, ask your health care provider. Keep all follow-up visits. This is important. Contact a health care provider if: You received a tetanus shot and you have swelling, severe pain, redness, or bleeding at the injection site. Your pain is not controlled with medicine. You have any of these signs of infection: More redness, swelling, or pain around the wound. Fluid or blood coming from the wound. Warmth coming from the wound. A fever or chills. You are nauseous or you vomit. You are dizzy. You have a new rash or hardness around the wound. Get help right away if: You have a red streak of skin near the area around your wound. Pus or a bad smell coming from the wound. Your wound has been closed with staples, sutures, skin glue, or adhesive strips and it begins to open up and separate. Your wound is bleeding, and the bleeding does not stop with gentle pressure. These symptoms may represent a serious problem that is an emergency. Do not wait to see if the symptoms will go away. Get medical help right away. Call your local emergency  services (911 in the [place] Do not drive yourself to the hospital. Summary Always wash your hands with soap and water for at least 20 seconds before and after changing your dressing. Change your dressing as told by your health care provider. To help with healing, eat foods that are rich in protein, vitamin A, vitamin C, and other nutrients. Check your wound every day for signs of infection. Contact your health  care provider if you think that your wound is infected. This information is not intended to replace advice given to you by your health care provider. Make sure you discuss any questions you have with your health care provider. Document Revised: 09/24/2020 Document Reviewed: 09/24/2020 Elsevier Patient Education  2022 Maurice, MD [facility]

## 2021-06-16 ENCOUNTER — Telehealth: Payer: Self-pay | Admitting: Internal Medicine

## 2021-06-16 ENCOUNTER — Other Ambulatory Visit: Payer: Self-pay | Admitting: Internal Medicine

## 2021-06-16 DIAGNOSIS — E063 Autoimmune thyroiditis: Secondary | ICD-10-CM

## 2021-06-16 DIAGNOSIS — E038 Other specified hypothyroidism: Secondary | ICD-10-CM

## 2021-06-16 MED ORDER — SYNTHROID 200 MCG PO TABS: 200.0000 ug | ORAL_TABLET | Freq: Every morning | ORAL | 0 refills | Status: DC

## 2021-06-16 NOTE — Telephone Encounter (Signed)
1.Medication Requested: SYNTHROID 200 MCG tablet  2. Pharmacy (Name, [place]): [id number] - [place], [place]  3. On Med List: yes  4. Last Visit with PCP: 04-09-2021  5. Next visit date with PCP: 07-16-2021 (sagardia)   Agent: Please be advised that RX refills may take up to 3 business days. We ask that you follow-up with your pharmacy.

## 2021-07-16 ENCOUNTER — Ambulatory Visit (INDEPENDENT_AMBULATORY_CARE_PROVIDER_SITE_OTHER): Payer: Medicare Other | Admitting: Emergency Medicine

## 2021-07-16 ENCOUNTER — Encounter: Payer: Self-pay | Admitting: Emergency Medicine

## 2021-07-16 ENCOUNTER — Ambulatory Visit: Payer: Medicare Other | Admitting: Internal Medicine

## 2021-07-16 ENCOUNTER — Other Ambulatory Visit: Payer: Self-pay

## 2021-07-16 VITALS — BP 128/72 | HR 59 | Temp 97.8°F | Ht 68.0 in | Wt 161.2 lb

## 2021-07-16 DIAGNOSIS — Z889 Allergy status to unspecified drugs, medicaments and biological substances status: Secondary | ICD-10-CM | POA: Diagnosis not present

## 2021-07-16 DIAGNOSIS — Z5189 Encounter for other specified aftercare: Secondary | ICD-10-CM | POA: Diagnosis not present

## 2021-07-16 DIAGNOSIS — J4530 Mild persistent asthma, uncomplicated: Secondary | ICD-10-CM

## 2021-07-16 DIAGNOSIS — J45909 Unspecified asthma, uncomplicated: Secondary | ICD-10-CM | POA: Insufficient documentation

## 2021-07-16 DIAGNOSIS — J0100 Acute maxillary sinusitis, unspecified: Secondary | ICD-10-CM | POA: Insufficient documentation

## 2021-07-16 MED ORDER — AMOXICILLIN-POT CLAVULANATE 875-125 MG PO TABS
1.0000 | ORAL_TABLET | Freq: Two times a day (BID) | ORAL | 0 refills | Status: AC
Start: 2021-07-16 — End: 2021-07-23

## 2021-07-16 NOTE — Patient Instructions (Signed)
Health Maintenance After Age 86 After age 86, you are at a higher risk for certain long-term diseases and infections as well as injuries from falls. Falls are a major cause of broken bones and head injuries in people who are older than age 86. Getting regular preventive care can help to keep you healthy and well. Preventive care includes getting regular testing and making lifestyle changes as recommended by your health care provider. Talk with your health care provider about: Which screenings and tests you should have. A screening is a test that checks for a disease when you have no symptoms. A diet and exercise plan that is right for you. What should I know about screenings and tests to prevent falls? Screening and testing are the best ways to find a health problem early. Early diagnosis and treatment give you the best chance of managing medical conditions that are common after age 86. Certain conditions and lifestyle choices may make you more likely to have a fall. Your health care provider may recommend: Regular vision checks. Poor vision and conditions such as cataracts can make you more likely to have a fall. If you wear glasses, make sure to get your prescription updated if your vision changes. Medicine review. Work with your health care provider to regularly review all of the medicines you are taking, including over-the-counter medicines. Ask your health care provider about any side effects that may make you more likely to have a fall. Tell your health care provider if any medicines that you take make you feel dizzy or sleepy. Strength and balance checks. Your health care provider may recommend certain tests to check your strength and balance while standing, walking, or changing positions. Foot health exam. Foot pain and numbness, as well as not wearing proper footwear, can make you more likely to have a fall. Screenings, including: Osteoporosis screening. Osteoporosis is a condition that causes  the bones to get weaker and break more easily. Blood pressure screening. Blood pressure changes and medicines to control blood pressure can make you feel dizzy. Depression screening. You may be more likely to have a fall if you have a fear of falling, feel depressed, or feel unable to do activities that you used to do. Alcohol use screening. Using too much alcohol can affect your balance and may make you more likely to have a fall. Follow these instructions at home: Lifestyle Do not drink alcohol if: Your health care provider tells you not to drink. If you drink alcohol: Limit how much you have to: 0-1 drink a day for women. 0-2 drinks a day for men. Know how much alcohol is in your drink. In the [place] one drink equals one 12 oz bottle of beer (355 mL), one 5 oz glass of wine (148 mL), or one 1 oz glass of hard liquor (44 mL). Do not use any products that contain nicotine or tobacco. These products include cigarettes, chewing tobacco, and vaping devices, such as e-cigarettes. If you need help quitting, ask your health care provider. Activity  Follow a regular exercise program to stay fit. This will help you maintain your balance. Ask your health care provider what types of exercise are appropriate for you. If you need a cane or walker, use it as recommended by your health care provider. Wear supportive shoes that have nonskid soles. Safety  Remove any tripping hazards, such as rugs, cords, and clutter. Install safety equipment such as grab bars in bathrooms and safety rails on stairs. Keep rooms and walkways   well-lit. General instructions Talk with your health care provider about your risks for falling. Tell your health care provider if: You fall. Be sure to tell your health care provider about all falls, even ones that seem minor. You feel dizzy, tiredness (fatigue), or off-balance. Take over-the-counter and prescription medicines only as told by your health care provider. These include  supplements. Eat a healthy diet and maintain a healthy weight. A healthy diet includes low-fat dairy products, low-fat (lean) meats, and fiber from whole grains, beans, and lots of fruits and vegetables. Stay current with your vaccines. Schedule regular health, dental, and eye exams. Summary Having a healthy lifestyle and getting preventive care can help to protect your health and wellness after age 86. Screening and testing are the best way to find a health problem early and help you avoid having a fall. Early diagnosis and treatment give you the best chance for managing medical conditions that are more common for people who are older than age 86. Falls are a major cause of broken bones and head injuries in people who are older than age 86. Take precautions to prevent a fall at home. Work with your health care provider to learn what changes you can make to improve your health and wellness and to prevent falls. This information is not intended to replace advice given to you by your health care provider. Make sure you discuss any questions you have with your health care provider. Document Revised: 10/07/2020 Document Reviewed: 10/07/2020 Elsevier Patient Education  2022 [employer]  

## 2021-07-16 NOTE — Assessment & Plan Note (Signed)
Right lower leg laceration healing very well.  No infection or complications.

## 2021-07-16 NOTE — Assessment & Plan Note (Signed)
Several weeks with sinus symptoms.  May be developing secondary bacterial infection.  May benefit from antibiotic treatment for 7 days.

## 2021-07-16 NOTE — Progress Notes (Signed)
[name] Mode 88 y.o.   Chief Complaint  Patient presents with   Follow-up    Patient is having trouble eating, he hasnt had an appitite. This has been going on for several months and continues to loose weight.     HISTORY OF PRESENT ILLNESS: This is a 88 y.o. male here for follow-up visit on 06/11/2021 when I saw him with a right lower leg laceration. Healing well.  No infection.  Here today with his daughter. Complaining of "having a cold" for the last couple of weeks with cough and "sinus drainage". No fever or chills.  No other associated symptoms. Has multiple chronic medical problems.  Patient of Dr. [name]. Extensive cardiac history.  Decreased appetite as per daughter.  Diarrhea  Associated symptoms include coughing. Pertinent negatives include no abdominal pain, chills, fever, headaches or vomiting.    Prior to Admission medications   Medication Sig Start Date End Date Taking? Authorizing Provider  ACCU-CHEK AVIVA PLUS test strip  07/12/10   [provider]  acetaminophen (TYLENOL) 650 MG CR tablet Take 650 mg by mouth every 8 (eight) hours as needed for pain.    [provider]  aspirin EC 81 MG tablet Take 81 mg by mouth in the morning. Swallow whole.    [provider]  atorvastatin (LIPITOR) 40 MG tablet Take 1 tablet (40 mg total) by mouth in the morning. 01/24/21   [name], NP  clopidogrel (PLAVIX) 75 MG tablet Take 1 tablet (75 mg total) by mouth in the morning. 04/29/21   Janith Lima, MD  doxycycline (VIBRA-TABS) 100 MG tablet Take 100 mg by mouth 2 (two) times daily. 04/11/21   [provider]  FLOVENT HFA 110 MCG/ACT inhaler Inhale 2 puffs into the lungs 2 (two) times daily. 03/25/15   [provider]  fluticasone (FLONASE) 50 MCG/ACT nasal spray Place 2 sprays into the nose in the morning.    [provider]  furosemide (LASIX) 20 MG tablet Take 1 tablet (20 mg total) by mouth in the  morning. 01/24/21 04/24/21  [name], NP  ipratropium-albuterol (DUONEB) 0.5-2.5 (3) MG/3ML SOLN Take 3 mLs by nebulization every 6 (six) hours as needed (wheezing/shortness of breath).    [provider]  metoprolol (TOPROL-XL) 200 MG 24 hr tablet Take 0.5 tablets (100 mg total) by mouth in the morning and at bedtime. 01/24/21   [name], NP  mirtazapine (REMERON) 30 MG tablet Take 30 mg by mouth at bedtime. 05/31/20   [provider]  montelukast (SINGULAIR) 10 MG tablet Take 10 mg by mouth daily. 02/05/17   [provider]  mupirocin ointment (BACTROBAN) 2 % Apply topically daily. 04/11/21   [provider]  pantoprazole (PROTONIX) 40 MG tablet Take 40 mg by mouth 2 (two) times daily. Morning & Evening 08/04/18   [provider]  Polyethyl Glycol-Propyl Glycol (SYSTANE OP) Place 1 drop into both eyes in the morning and at bedtime.    [provider]  SYNTHROID 200 MCG tablet Take 1 tablet (200 mcg total) by mouth in the morning. 06/16/21   Janith Lima, MD  vitamin B-12 (CYANOCOBALAMIN) 1000 MCG tablet Take 1,000 mcg by mouth in the morning.    [provider]    Allergies  Allergen Reactions   Prednisone Rash   Sulfa Antibiotics Other (See Comments)    Hyper     Patient Active Problem List   Diagnosis Date Noted  Deficiency anemia 04/09/2021   HFrEF (heart failure with reduced ejection fraction) ([place]) 11/22/2020   Medication management 06/14/2018   PAD (peripheral artery disease) ([place]) 04/05/2018   Diet-controlled diabetes mellitus ([place]) 02/01/2018   Diminished pulses in lower extremity 02/01/2018   Hashimoto's thyroiditis 03/08/2017   Ischemic cardiomyopathy 03/08/2017   Coronary artery disease 03/08/2017   Hypertension 03/08/2017   Blepharitis of upper and lower eyelids of both eyes 12/16/2016   Hypertropia of right eye 12/16/2016   Hypotropia of left eye 09/08/2016   Pseudophakia of both eyes  07/22/2016   Cataract extraction status 06/03/2016   Diplopia 04/06/2016   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   Ventricular tachycardia 04/19/2014   Carotid stenosis 05/22/2011   CAD (coronary artery disease) 05/22/2011   Automatic implantable cardioverter-defibrillator in situ 12/05/2009   Presence of automatic (implantable) cardiac defibrillator 12/05/2009   HYPOTHYROIDISM 04/29/2009   COPD 12/01/2007   Essential hypertension 11/17/2007   MYOCARDIAL INFARCTION 76/16/0737   Chronic systolic congestive heart failure ([place]) 11/17/2007    Past Medical History:  Diagnosis Date   Atrial flutter ([place])    (I could not find documentation of this rhythm.)   AUTOMATIC IMPLANTABLE CARDIAC DEFIBRILLATOR SITU    Automatic implantable cardioverter-defibrillator in situ 12/05/2009   Qualifier: Diagnosis of  By: [name], MD, [name]    CAD (coronary artery disease) 05/22/2011   Carotid stenosis 05/22/2011   CHF CONGESTIVE HEART FAILURE    45% by echo 2015   COPD 12/01/2007   Qualifier: Diagnosis of  By: [name] NP, [name]     DM2 (diabetes mellitus, type 2) ([place])    DYSLIPIDEMIA    DYSPNEA    Edema    Essential hypertension 11/17/2007   Qualifier: Diagnosis of  By: [name]     Gout    HYPERTENSION    HYPOTHYROIDISM    MYOCARDIAL INFARCTION    MI 1999, stent x 2 to RCA, 70% circ, 30 and 40 % LADs   S/P updgrade to CRT-D 11/23/2020   WEIGHT GAIN, ABNORMAL     Past Surgical History:  Procedure Laterality Date   ANGIOPLASTY     stent   APPENDECTOMY  1957   BIV UPGRADE N/A 11/22/2020   Procedure: BIV ICD UPGRADE;  Surgeon: Evans Lance, MD;  Location: [facility];  Service: Cardiovascular;  Laterality: N/A;   CARDIAC DEFIBRILLATOR PLACEMENT     CAROTID STENT     EYE SURGERY  1985   LOWER EXTREMITY ANGIOGRAPHY N/A 05/04/2018   Procedure: LOWER EXTREMITY ANGIOGRAPHY;  Surgeon: [name], MD;  Location: [facility];  Service: Cardiovascular;   Laterality: N/A;   PERIPHERAL VASCULAR INTERVENTION Bilateral 05/04/2018   Procedure: PERIPHERAL VASCULAR INTERVENTION;  Surgeon: [name], MD;  Location: [facility];  Service: Cardiovascular;  Laterality: Bilateral;  [place]    Social History   Socioeconomic History   Marital status: Married    Spouse name: Ila   Number of children: 3   Years of education: Not on file   Highest education level: Not on file  Occupational History   Not on file  Tobacco Use   Smoking status: Former    Types: Cigarettes    Quit date: 12/10/1983    Years since quitting: 37.6   Smokeless tobacco: Never   Tobacco comments:    quit 1985  Substance and Sexual Activity   Alcohol use: No   Drug use: No   Sexual activity: Not on file  Other Topics Concern   Not on file  Social History Narrative   Not on file   Social Determinants of Health   Financial Resource Strain: Not on file  Food Insecurity: Not on file  Transportation Needs: Not on file  Physical Activity: Not on file  Stress: Not on file  Social Connections: Not on file  Intimate Partner Violence: Not on file    Family History  Problem Relation Age of Onset   Other Mother        natural causes   Kidney disease Father    Cancer Brother        throat cancer     Review of Systems  Constitutional: Negative.  Negative for chills and fever.  HENT:  Positive for congestion and sore throat.   Respiratory:  Positive for cough.   Cardiovascular:  Negative for chest pain and palpitations.  Gastrointestinal:  Positive for diarrhea. Negative for abdominal pain, blood in stool, melena, nausea and vomiting.  Genitourinary: Negative.  Negative for dysuria and hematuria.  Skin: Negative.  Negative for rash.  Neurological: Negative.  Negative for dizziness and headaches.    Physical Exam Vitals reviewed.  Constitutional:      Appearance: Normal appearance.  HENT:     Head: Normocephalic.     Nose: Congestion present.      Mouth/Throat:     Mouth: Mucous membranes are moist.     Pharynx: Oropharynx is clear. No oropharyngeal exudate or posterior oropharyngeal erythema.  Eyes:     Extraocular Movements: Extraocular movements intact.     Pupils: Pupils are equal, round, and reactive to light.  Cardiovascular:     Rate and Rhythm: Normal rate and regular rhythm.     Pulses: Normal pulses.     Heart sounds: Normal heart sounds.  Pulmonary:     Effort: Pulmonary effort is normal.     Breath sounds: Normal breath sounds.  Musculoskeletal:     Cervical back: No tenderness.  Lymphadenopathy:     Cervical: No cervical adenopathy.  Skin:    General: Skin is warm and dry.  Neurological:     General: No focal deficit present.     Mental Status: He is alert and oriented to person, place, and time.  Psychiatric:        Mood and Affect: Mood normal.        Behavior: Behavior normal.      ASSESSMENT & PLAN: Problem List Items Addressed This Visit       Respiratory   Extrinsic asthma   Relevant Orders   Ambulatory referral to Pulmonology   Subacute maxillary sinusitis - Primary    Several weeks with sinus symptoms.  May be developing secondary bacterial infection.  May benefit from antibiotic treatment for 7 days.      Relevant Medications   amoxicillin-clavulanate (AUGMENTIN) 875-125 MG tablet     Other   Encounter for wound re-check    Right lower leg laceration healing very well.  No infection or complications.      Multiple allergies   Patient Instructions  Health Maintenance After Age 86 After age 73, you are at a higher risk for certain long-term diseases and infections as well as injuries from falls. Falls are a major cause of broken bones and head injuries in people who are older than age 71. Getting regular preventive care can help to keep you healthy and well. Preventive care includes getting regular testing and making lifestyle changes as recommended by  your health care provider.  Talk with your health care provider about: Which screenings and tests you should have. A screening is a test that checks for a disease when you have no symptoms. A diet and exercise plan that is right for you. What should I know about screenings and tests to prevent falls? Screening and testing are the best ways to find a health problem early. Early diagnosis and treatment give you the best chance of managing medical conditions that are common after age 61. Certain conditions and lifestyle choices may make you more likely to have a fall. Your health care provider may recommend: Regular vision checks. Poor vision and conditions such as cataracts can make you more likely to have a fall. If you wear glasses, make sure to get your prescription updated if your vision changes. Medicine review. Work with your health care provider to regularly review all of the medicines you are taking, including over-the-counter medicines. Ask your health care provider about any side effects that may make you more likely to have a fall. Tell your health care provider if any medicines that you take make you feel dizzy or sleepy. Strength and balance checks. Your health care provider may recommend certain tests to check your strength and balance while standing, walking, or changing positions. Foot health exam. Foot pain and numbness, as well as not wearing proper footwear, can make you more likely to have a fall. Screenings, including: Osteoporosis screening. Osteoporosis is a condition that causes the bones to get weaker and break more easily. Blood pressure screening. Blood pressure changes and medicines to control blood pressure can make you feel dizzy. Depression screening. You may be more likely to have a fall if you have a fear of falling, feel depressed, or feel unable to do activities that you used to do. Alcohol use screening. Using too much alcohol can affect your balance and may make you more likely to have a  fall. Follow these instructions at home: Lifestyle Do not drink alcohol if: Your health care provider tells you not to drink. If you drink alcohol: Limit how much you have to: 0-1 drink a day for women. 0-2 drinks a day for men. Know how much alcohol is in your drink. In the [place] one drink equals one 12 oz bottle of beer (355 mL), one 5 oz glass of wine (148 mL), or one 1 oz glass of hard liquor (44 mL). Do not use any products that contain nicotine or tobacco. These products include cigarettes, chewing tobacco, and vaping devices, such as e-cigarettes. If you need help quitting, ask your health care provider. Activity  Follow a regular exercise program to stay fit. This will help you maintain your balance. Ask your health care provider what types of exercise are appropriate for you. If you need a cane or walker, use it as recommended by your health care provider. Wear supportive shoes that have nonskid soles. Safety  Remove any tripping hazards, such as rugs, cords, and clutter. Install safety equipment such as grab bars in bathrooms and safety rails on stairs. Keep rooms and walkways well-lit. General instructions Talk with your health care provider about your risks for falling. Tell your health care provider if: You fall. Be sure to tell your health care provider about all falls, even ones that seem minor. You feel dizzy, tiredness (fatigue), or off-balance. Take over-the-counter and prescription medicines only as told by your health care provider. These include supplements. Eat a healthy diet and maintain a healthy  weight. A healthy diet includes low-fat dairy products, low-fat (lean) meats, and fiber from whole grains, beans, and lots of fruits and vegetables. Stay current with your vaccines. Schedule regular health, dental, and eye exams. Summary Having a healthy lifestyle and getting preventive care can help to protect your health and wellness after age 59. Screening and  testing are the best way to find a health problem early and help you avoid having a fall. Early diagnosis and treatment give you the best chance for managing medical conditions that are more common for people who are older than age 55. Falls are a major cause of broken bones and head injuries in people who are older than age 36. Take precautions to prevent a fall at home. Work with your health care provider to learn what changes you can make to improve your health and wellness and to prevent falls. This information is not intended to replace advice given to you by your health care provider. Make sure you discuss any questions you have with your health care provider. Document Revised: 10/07/2020 Document Reviewed: 10/07/2020 Elsevier Patient Education  2022 [place], MD [facility]

## 2021-07-17 DIAGNOSIS — L570 Actinic keratosis: Secondary | ICD-10-CM | POA: Diagnosis not present

## 2021-07-17 DIAGNOSIS — C4442 Squamous cell carcinoma of skin of scalp and neck: Secondary | ICD-10-CM | POA: Diagnosis not present

## 2021-07-21 ENCOUNTER — Other Ambulatory Visit: Payer: Self-pay | Admitting: Internal Medicine

## 2021-07-25 ENCOUNTER — Telehealth: Payer: Self-pay

## 2021-07-25 ENCOUNTER — Ambulatory Visit: Payer: Medicare Other | Admitting: Internal Medicine

## 2021-07-25 NOTE — Telephone Encounter (Signed)
Pt is calling to request a refill on: montelukast (SINGULAIR) 10 MG tablet  Pharmacy: [id number] - [place], [place]  LOV: 04/09/21 POV 4/23

## 2021-07-26 ENCOUNTER — Other Ambulatory Visit: Payer: Self-pay | Admitting: Internal Medicine

## 2021-07-26 DIAGNOSIS — J4530 Mild persistent asthma, uncomplicated: Secondary | ICD-10-CM

## 2021-07-26 MED ORDER — MONTELUKAST SODIUM 10 MG PO TABS
10.0000 mg | ORAL_TABLET | Freq: Every day | ORAL | 1 refills | Status: DC
Start: 2021-07-26 — End: 2022-01-16

## 2021-07-30 DIAGNOSIS — H6982 Other specified disorders of Eustachian tube, left ear: Secondary | ICD-10-CM | POA: Diagnosis not present

## 2021-07-31 NOTE — Progress Notes (Signed)
Cardiology Office Note   Date:  08/01/2021   ID:  [name], DOB 07-28-1933, MRN [id number]  PCP:  Janith Lima, MD  Cardiologist:   [name], MD   Chief Complaint  Patient presents with   Cardiomyopathy      History of Present Illness: [name] is a 88 y.o. male who presents for followup of his coronary disease and cardiomyopathy.   At the last visit his EF was lower than previous at 25% and I started [place].  However, he developed significant hypotension and has not tolerated this and has been off that med.  A repeat echo was unchanged at 25%.   He had a CRT D upgrade and was seen by Dr. [name] in October 2021.  I reviewed these records for this visit.    He had normal device function.     Since I last saw him he has had no acute cardiovascular problems.  He still has leg weakness and some fatigue.  He is not however having any new shortness of breath, PND or orthopnea.  He is not having any new palpitations, presyncope or syncope.  He has had no weight gain or edema.  He has had a very slowly healing right leg wound.  Of note he has had some increased creatinine and hyperkalemia and is off the Cozaar.   Past Medical History:  Diagnosis Date   Atrial flutter ([place])    (I could not find documentation of this rhythm.)   AUTOMATIC IMPLANTABLE CARDIAC DEFIBRILLATOR SITU    Automatic implantable cardioverter-defibrillator in situ 12/05/2009   Qualifier: Diagnosis of  By: [name], MD, [facility], Binnie Kand    CAD (coronary artery disease) 05/22/2011   Carotid stenosis 05/22/2011   CHF CONGESTIVE HEART FAILURE    45% by echo 2015   COPD 12/01/2007   Qualifier: Diagnosis of  By: [name] NP, [name]     DM2 (diabetes mellitus, type 2) ([place])    DYSLIPIDEMIA    DYSPNEA    Edema    Essential hypertension 11/17/2007   Qualifier: Diagnosis of  By: [name]     Gout    HYPERTENSION    HYPOTHYROIDISM    MYOCARDIAL INFARCTION    MI 1999, stent x 2 to RCA, 70%  circ, 30 and 40 % LADs   S/P updgrade to CRT-D 11/23/2020   WEIGHT GAIN, ABNORMAL     Past Surgical History:  Procedure Laterality Date   ANGIOPLASTY     stent   APPENDECTOMY  1957   BIV UPGRADE N/A 11/22/2020   Procedure: BIV ICD UPGRADE;  Surgeon: Evans Lance, MD;  Location: [place] [facility];  Service: Cardiovascular;  Laterality: N/A;   CARDIAC DEFIBRILLATOR PLACEMENT     CAROTID STENT     EYE SURGERY  1985   LOWER EXTREMITY ANGIOGRAPHY N/A 05/04/2018   Procedure: LOWER EXTREMITY ANGIOGRAPHY;  Surgeon: [name], MD;  Location: [place] [facility];  Service: Cardiovascular;  Laterality: N/A;   PERIPHERAL VASCULAR INTERVENTION Bilateral 05/04/2018   Procedure: PERIPHERAL VASCULAR INTERVENTION;  Surgeon: [name], MD;  Location: [place] [facility];  Service: Cardiovascular;  Laterality: Bilateral;  [place]     Current Outpatient Medications  Medication Sig Dispense Refill   ACCU-CHEK AVIVA PLUS test strip      acetaminophen (TYLENOL) 650 MG CR tablet Take 650 mg by mouth every 8 (eight) hours as needed for pain.     aspirin EC 81 MG  tablet Take 81 mg by mouth in the morning. Swallow whole.     atorvastatin (LIPITOR) 40 MG tablet Take 1 tablet (40 mg total) by mouth in the morning. 90 tablet 3   carboxymethylcellul-glycerin (REFRESH OPTIVE) 0.5-0.9 % ophthalmic solution Place 2 drops into both eyes daily.     clopidogrel (PLAVIX) 75 MG tablet Take 1 tablet (75 mg total) by mouth in the morning. 90 tablet 1   FLOVENT HFA 110 MCG/ACT inhaler Inhale 2 puffs into the lungs 2 (two) times daily.  3   fluticasone (FLONASE) 50 MCG/ACT nasal spray Place 2 sprays into the nose in the morning.     hydrALAZINE (APRESOLINE) 10 MG tablet Take 1 tablet (10 mg total) by mouth in the morning and at bedtime. 180 tablet 3   ipratropium-albuterol (DUONEB) 0.5-2.5 (3) MG/3ML SOLN Take 3 mLs by nebulization every 6 (six) hours as needed (wheezing/shortness of breath).      isosorbide mononitrate (IMDUR) 30 MG 24 hr tablet Take 1 tablet (30 mg total) by mouth daily. 90 tablet 3   metoprolol (TOPROL-XL) 200 MG 24 hr tablet Take 0.5 tablets (100 mg total) by mouth in the morning and at bedtime. 90 tablet 3   mirtazapine (REMERON) 30 MG tablet TAKE 1 TABLET BY MOUTH EVERY DAY 90 tablet 1   montelukast (SINGULAIR) 10 MG tablet Take 1 tablet (10 mg total) by mouth daily. 90 tablet 1   mupirocin ointment (BACTROBAN) 2 % Apply topically daily.     pantoprazole (PROTONIX) 40 MG tablet Take 40 mg by mouth 2 (two) times daily. Morning & Evening     SYNTHROID 200 MCG tablet Take 1 tablet (200 mcg total) by mouth in the morning. 90 tablet 0   vitamin B-12 (CYANOCOBALAMIN) 1000 MCG tablet Take 1,000 mcg by mouth in the morning.     furosemide (LASIX) 20 MG tablet Take 1 tablet (20 mg total) by mouth in the morning. 90 tablet 3   Polyethyl Glycol-Propyl Glycol (SYSTANE OP) Place 1 drop into both eyes in the morning and at bedtime. (Patient not taking: Reported on 08/01/2021)     No current facility-administered medications for this visit.    Allergies:   Prednisone and Sulfa antibiotics    ROS:  Please see the history of present illness.   Otherwise, review of systems are positive for GERD, excessive gas, occasional diarrhea.   All other systems are reviewed and negative.    PHYSICAL EXAM: VS:  BP (!) 140/56    Pulse (!) 57    Ht 5\' 8"  (1.727 m)    Wt 158 lb 6.4 oz (71.8 kg)    SpO2 90%    BMI 24.08 kg/m  , BMI Body mass index is 24.08 kg/m. GENERAL:  Well appearing NECK:  No jugular venous distention, waveform within normal limits, carotid upstroke brisk and symmetric, no bruits, no thyromegaly LUNGS:  Clear to auscultation bilaterally CHEST: Well-healed ICD pocket HEART:  PMI not displaced or sustained,S1 and S2 within normal limits, no S3, no S4, no clicks, no rubs, no murmurs ABD:  Flat, positive bowel sounds normal in frequency in pitch, no bruits, no rebound, no  guarding, no midline pulsatile mass, no hepatomegaly, no splenomegaly EXT:  2 plus pulses throughout, no edema, no cyanosis no clubbing, healing right pretibial wound  EKG:  EKG is not ordered today. NA  Recent Labs: 04/09/2021: BUN 36; Creatinine, Ser 1.41; Hemoglobin 10.3; Platelets 211.0; Potassium 5.3 No hemolysis seen; Sodium 139; TSH 0.81 04/30/2021:  ALT 10    Lipid Panel    Component Value Date/Time   CHOL 112 04/30/2021 1304   TRIG 69 04/30/2021 1304   HDL 41 04/30/2021 1304   CHOLHDL 2.7 04/30/2021 1304   CHOLHDL 4 06/22/2011 0925   VLDL 25.0 06/22/2011 0925   LDLCALC 56 04/30/2021 1304      Wt Readings from Last 3 Encounters:  08/01/21 158 lb 6.4 oz (71.8 kg)  07/16/21 161 lb 3.2 oz (73.1 kg)  06/11/21 174 lb (78.9 kg)      Other studies Reviewed: Additional studies/ records that were reviewed today include: Labs Review of the above records demonstrates:   See elsewhere   ASSESSMENT AND PLAN:  CAD:   The patient has no new sypmtoms.  No further cardiovascular testing is indicated.  We will continue with aggressive risk reduction and meds as listed.  HTN:    His blood pressure is mildly elevated.  This gives me the opportunity to titrate hydralazine nitrates as below.  We will have him come back in about 6 months to see Fawn Kirk DNP and he might be able to have titration of these.  We cannot use ACE/ARB because of hyperkalemia and renal insufficiency.    DYSLIPIDEMIA:    His LDL was 56 with an HDL of 41.  This was in November.  No change in therapy.   CARDIOMYOPATHY:    He seems to be euvolemic but I will titrate meds as above.   CRT D:   He is up-to-date with follow-up and I did review Dr. Tanna Furry most recent note.   CKD: This seems to be CKD 2 with a creatinine of 1.4.  This can be followed.  Current medicines are reviewed at length with the patient today.  The patient does not have concerns regarding medicines.  The following changes have been  made: As above  Labs/ tests ordered today include:     No orders of the defined types were placed in this encounter.      Disposition:   FU as above in 6 months.    Signed, [name], MD  08/01/2021 11:40 AM    [place]

## 2021-08-01 ENCOUNTER — Ambulatory Visit: Payer: Medicare Other | Admitting: Cardiology

## 2021-08-01 ENCOUNTER — Other Ambulatory Visit: Payer: Self-pay

## 2021-08-01 ENCOUNTER — Encounter: Payer: Self-pay | Admitting: Cardiology

## 2021-08-01 VITALS — BP 140/56 | HR 57 | Ht 68.0 in | Wt 158.4 lb

## 2021-08-01 DIAGNOSIS — E785 Hyperlipidemia, unspecified: Secondary | ICD-10-CM

## 2021-08-01 DIAGNOSIS — I2510 Atherosclerotic heart disease of native coronary artery without angina pectoris: Secondary | ICD-10-CM | POA: Diagnosis not present

## 2021-08-01 DIAGNOSIS — I5022 Chronic systolic (congestive) heart failure: Secondary | ICD-10-CM

## 2021-08-01 DIAGNOSIS — I10 Essential (primary) hypertension: Secondary | ICD-10-CM

## 2021-08-01 MED ORDER — ISOSORBIDE MONONITRATE ER 30 MG PO TB24: 30.0000 mg | ORAL_TABLET | Freq: Every day | ORAL | 3 refills | Status: DC

## 2021-08-01 MED ORDER — HYDRALAZINE HCL 10 MG PO TABS: 10.0000 mg | ORAL_TABLET | Freq: Two times a day (BID) | ORAL | 3 refills | Status: DC

## 2021-08-01 NOTE — Patient Instructions (Signed)
Medication Instructions:  ?Your physician has recommended you make the following change in your medication:  ?START: Hydalazine 10 mg twice daily ?START: Imdur 30 mg once daily ?*If you need a refill on your cardiac medications before your next appointment, please call your pharmacy* ? ? ?Lab Work: ?None ?If you have labs (blood work) drawn today and your tests are completely normal, you will receive your results only by: ?MyChart Message (if you have MyChart) OR ?A paper copy in the mail ?If you have any lab test that is abnormal or we need to change your treatment, we will call you to review the results. ? ? ?Testing/Procedures: ?None ? ? ?Follow-Up: ?At [facility], you and your health needs are our priority.  As part of our continuing mission to provide you with exceptional heart care, we have created designated Provider Care Teams.  These Care Teams include your primary Cardiologist (physician) and Advanced Practice Providers (APPs -  Physician Assistants and Nurse Practitioners) who all work together to provide you with the care you need, when you need it. ? ?We recommend signing up for the patient portal called "MyChart".  Sign up information is provided on this After Visit Summary.  MyChart is used to connect with patients for Virtual Visits (Telemedicine).  Patients are able to view lab/test results, encounter notes, upcoming appointments, etc.  Non-urgent messages can be sent to your provider as well.   ?To learn more about what you can do with MyChart, go to [email or web address].   ? ?Your next appointment:   ?6 month(s) ? ?The format for your next appointment:   ?In Person ? ?Provider:   ?Jory Sims, DNP, ANP      ? ? ?Other Instructions ?  ?

## 2021-08-22 ENCOUNTER — Ambulatory Visit (INDEPENDENT_AMBULATORY_CARE_PROVIDER_SITE_OTHER): Payer: Medicare Other

## 2021-08-22 ENCOUNTER — Ambulatory Visit: Payer: Medicare Other | Admitting: Family Medicine

## 2021-08-22 DIAGNOSIS — I255 Ischemic cardiomyopathy: Secondary | ICD-10-CM | POA: Diagnosis not present

## 2021-08-25 DIAGNOSIS — J449 Chronic obstructive pulmonary disease, unspecified: Secondary | ICD-10-CM | POA: Diagnosis not present

## 2021-08-25 DIAGNOSIS — J90 Pleural effusion, not elsewhere classified: Secondary | ICD-10-CM | POA: Diagnosis not present

## 2021-08-25 DIAGNOSIS — R0602 Shortness of breath: Secondary | ICD-10-CM | POA: Diagnosis not present

## 2021-08-25 DIAGNOSIS — G479 Sleep disorder, unspecified: Secondary | ICD-10-CM | POA: Diagnosis not present

## 2021-08-25 DIAGNOSIS — Z87891 Personal history of nicotine dependence: Secondary | ICD-10-CM | POA: Diagnosis not present

## 2021-08-25 LAB — CUP PACEART REMOTE DEVICE CHECK
Battery Remaining Longevity: 114 mo
Battery Remaining Percentage: 100 %
Brady Statistic RA Percent Paced: 4 %
Brady Statistic RV Percent Paced: 100 %
Date Time Interrogation Session: 20230324024100
HighPow Impedance: 41 Ohm
Implantable Lead Implant Date: 20020206
Implantable Lead Implant Date: 20071005
Implantable Lead Implant Date: 20220624
Implantable Lead Implant Date: 20220624
Implantable Lead Location: 753858
Implantable Lead Location: 753859
Implantable Lead Location: 753860
Implantable Lead Location: 753860
Implantable Lead Model: 148
Implantable Lead Model: 4088
Implantable Lead Model: 4677
Implantable Lead Model: 7841
Implantable Lead Serial Number: 1073796
Implantable Lead Serial Number: 109512
Implantable Lead Serial Number: 224392
Implantable Lead Serial Number: 824442
Implantable Pulse Generator Implant Date: 20220624
Lead Channel Impedance Value: 463 Ohm
Lead Channel Impedance Value: 605 Ohm
Lead Channel Impedance Value: 913 Ohm
Lead Channel Pacing Threshold Amplitude: 0.5 V
Lead Channel Pacing Threshold Pulse Width: 0.4 ms
Lead Channel Setting Pacing Amplitude: 2 V
Lead Channel Setting Pacing Amplitude: 2.5 V
Lead Channel Setting Pacing Amplitude: 3 V
Lead Channel Setting Pacing Pulse Width: 0.4 ms
Lead Channel Setting Pacing Pulse Width: 1.2 ms
Lead Channel Setting Sensing Sensitivity: 0.5 mV
Lead Channel Setting Sensing Sensitivity: 1 mV
Pulse Gen Serial Number: 385552

## 2021-08-27 ENCOUNTER — Telehealth: Payer: Self-pay | Admitting: Cardiology

## 2021-08-27 DIAGNOSIS — N289 Disorder of kidney and ureter, unspecified: Secondary | ICD-10-CM

## 2021-08-27 NOTE — Telephone Encounter (Signed)
Patient's daughter called and said that Dr. Percell Miller who is a Pulmonologist from [facility] told her to inform Dr. Percival Spanish that the patient is suffering from Pleural effusion. Please call back ?

## 2021-08-27 NOTE — Progress Notes (Signed)
Remote ICD transmission.   

## 2021-08-27 NOTE — Telephone Encounter (Signed)
Spoke with pt daughter, they are going to a new asthma doctor and they did a cxr and they told them to call us for the fluid in his lungs. She reports SOB with little exertion, but he has had that for sometime. He has swelling in his feet and legs that he has had for sometime as well. His weight is 163 lb. He has a cough during the day with clear sputum and he also coughs at night. He sleeps completely flat and does not have SOB. She reports that he has not taken his furosemide for at least 2-3 weeks now because he does not like getting up to go to the bathroom. Explained to the daughter that the only way to get the fluid out of his lungs was to take the furosemide. She reports she will try and I instructed her to have him take 2 tablets tomorrow morning. Will forward to dr hochrein for other recommendations. ?

## 2021-08-29 NOTE — Telephone Encounter (Signed)
Returned call to pt daughter she states that 2 days ago she had him increase for the one day she will have pt take the '20mg'$  daily from now on until Dr Percival Spanish has other plans. Informed daughter that unfortunately somehow this message did not get answered by [facility]. I will forward back to him for further doses and schedule for his lasix. She is willing to wait until Monday. Please advise ?

## 2021-08-29 NOTE — Telephone Encounter (Signed)
LMTCB

## 2021-08-29 NOTE — Telephone Encounter (Signed)
Patient's daughter called wanting to know what she should do with her father's furosemide.  She wants to know if she should continue to double it.  ?

## 2021-09-01 NOTE — Telephone Encounter (Signed)
Left message for pt daughter to call  ?

## 2021-09-02 ENCOUNTER — Ambulatory Visit: Payer: Medicare Other | Admitting: Internal Medicine

## 2021-09-02 NOTE — Telephone Encounter (Signed)
Spoke with pt daughter, aware of dr hochrein's recommendations.  ?Order placed for bmp. ?

## 2021-09-08 DIAGNOSIS — N289 Disorder of kidney and ureter, unspecified: Secondary | ICD-10-CM | POA: Diagnosis not present

## 2021-09-09 LAB — BASIC METABOLIC PANEL
BUN/Creatinine Ratio: 24 (ref 10–24)
BUN: 35 mg/dL — ABNORMAL HIGH (ref 8–27)
CO2: 21 mmol/L (ref 20–29)
Calcium: 9.3 mg/dL (ref 8.6–10.2)
Chloride: 108 mmol/L — ABNORMAL HIGH (ref 96–106)
Creatinine, Ser: 1.43 mg/dL — ABNORMAL HIGH (ref 0.76–1.27)
Glucose: 93 mg/dL (ref 70–99)
Potassium: 5.2 mmol/L (ref 3.5–5.2)
Sodium: 141 mmol/L (ref 134–144)
eGFR: 48 mL/min/{1.73_m2} — ABNORMAL LOW (ref >59)

## 2021-09-15 ENCOUNTER — Encounter: Payer: Self-pay | Admitting: Emergency Medicine

## 2021-09-15 ENCOUNTER — Ambulatory Visit (INDEPENDENT_AMBULATORY_CARE_PROVIDER_SITE_OTHER): Payer: Medicare Other | Admitting: Emergency Medicine

## 2021-09-15 VITALS — BP 160/70 | HR 52 | Temp 98.2°F | Ht 68.0 in | Wt 164.5 lb

## 2021-09-15 DIAGNOSIS — L089 Local infection of the skin and subcutaneous tissue, unspecified: Secondary | ICD-10-CM

## 2021-09-15 DIAGNOSIS — T148XXA Other injury of unspecified body region, initial encounter: Secondary | ICD-10-CM

## 2021-09-15 DIAGNOSIS — M79604 Pain in right leg: Secondary | ICD-10-CM | POA: Diagnosis not present

## 2021-09-15 MED ORDER — FUROSEMIDE 20 MG PO TABS: 20.0000 mg | ORAL_TABLET | Freq: Every morning | ORAL | 3 refills | Status: DC

## 2021-09-15 MED ORDER — TRAMADOL HCL 50 MG PO TABS: 50.0000 mg | ORAL_TABLET | Freq: Three times a day (TID) | ORAL | 0 refills | Status: AC | PRN

## 2021-09-15 MED ORDER — CEFUROXIME AXETIL 500 MG PO TABS: 500.0000 mg | ORAL_TABLET | Freq: Two times a day (BID) | ORAL | 0 refills | Status: AC

## 2021-09-15 NOTE — Progress Notes (Signed)
[name] ?88 y.o. ? ? ?Chief Complaint  ?Patient presents with  ? Follow-up  ? Leg Pain  ?  Right leg pain , progressively getting worse in the evening   ? ? ?HISTORY OF PRESENT ILLNESS: ?This is a 88 y.o. male complaining of painful right lower leg wound. ?Patient sustained laceration to same area last January. ? ?Leg Pain  ? ? ? ?Prior to Admission medications   ?Medication Sig Start Date End Date Taking? Authorizing Provider  ?ACCU-CHEK AVIVA PLUS test strip  07/12/10  Yes [provider]  ?acetaminophen (TYLENOL) 650 MG CR tablet Take 650 mg by mouth every 8 (eight) hours as needed for pain.   Yes [provider]  ?aspirin EC 81 MG tablet Take 81 mg by mouth in the morning. Swallow whole.   Yes [provider]  ?atorvastatin (LIPITOR) 40 MG tablet Take 1 tablet (40 mg total) by mouth in the morning. 01/24/21  Yes [name], NP  ?carboxymethylcellul-glycerin (REFRESH OPTIVE) 0.5-0.9 % ophthalmic solution Place 2 drops into both eyes daily.   Yes [provider]  ?clopidogrel (PLAVIX) 75 MG tablet Take 1 tablet (75 mg total) by mouth in the morning. 04/29/21  Yes Janith Lima, MD  ?FLOVENT HFA 110 MCG/ACT inhaler Inhale 2 puffs into the lungs 2 (two) times daily. 03/25/15  Yes [provider]  ?fluticasone (FLONASE) 50 MCG/ACT nasal spray Place 2 sprays into the nose in the morning.   Yes [provider]  ?hydrALAZINE (APRESOLINE) 10 MG tablet Take 1 tablet (10 mg total) by mouth in the morning and at bedtime. 08/01/21  Yes [name], MD  ?ipratropium-albuterol (DUONEB) 0.5-2.5 (3) MG/3ML SOLN Take 3 mLs by nebulization every 6 (six) hours as needed (wheezing/shortness of breath).   Yes [provider]  ?isosorbide mononitrate (IMDUR) 30 MG 24 hr tablet Take 1 tablet (30 mg total) by mouth daily. 08/01/21 10/30/21 Yes [name], MD  ?metoprolol (TOPROL-XL) 200 MG 24 hr tablet Take 0.5 tablets (100 mg total) by mouth in the  morning and at bedtime. 01/24/21  Yes [name], NP  ?mirtazapine (REMERON) 30 MG tablet TAKE 1 TABLET BY MOUTH EVERY DAY 07/22/21  Yes Janith Lima, MD  ?montelukast (SINGULAIR) 10 MG tablet Take 1 tablet (10 mg total) by mouth daily. 07/26/21  Yes Janith Lima, MD  ?mupirocin ointment (BACTROBAN) 2 % Apply topically daily. 04/11/21  Yes [provider]  ?pantoprazole (PROTONIX) 40 MG tablet Take 40 mg by mouth 2 (two) times daily. Morning & Evening 08/04/18  Yes [provider]  ?SYNTHROID 200 MCG tablet Take 1 tablet (200 mcg total) by mouth in the morning. 06/16/21  Yes Janith Lima, MD  ?vitamin B-12 (CYANOCOBALAMIN) 1000 MCG tablet Take 1,000 mcg by mouth in the morning.   Yes [provider]  ?furosemide (LASIX) 20 MG tablet Take 1 tablet (20 mg total) by mouth in the morning. 01/24/21 07/16/21  [name], NP  ?Polyethyl Glycol-Propyl Glycol (SYSTANE OP) Place 1 drop into both eyes in the morning and at bedtime. ?Patient not taking: Reported on 09/15/2021    [provider]  ? ? ?Allergies  ?Allergen Reactions  ? Prednisone Rash  ? Sulfa Antibiotics Other (See Comments)  ?  Hyper   ? ? ?Patient Active Problem List  ? Diagnosis Date Noted  ? Encounter for wound re-check 07/16/2021  ? Extrinsic asthma 07/16/2021  ? Multiple allergies 07/16/2021  ? Subacute maxillary  sinusitis 07/16/2021  ? Deficiency anemia 04/09/2021  ? HFrEF (heart failure with reduced ejection fraction) ([place]) 11/22/2020  ? Medication management 06/14/2018  ? PAD (peripheral artery disease) ([place]) 04/05/2018  ? Diet-controlled diabetes mellitus ([place]) 02/01/2018  ? Diminished pulses in lower extremity 02/01/2018  ? Hashimoto's thyroiditis 03/08/2017  ? Ischemic cardiomyopathy 03/08/2017  ? Coronary artery disease 03/08/2017  ? Hypertension 03/08/2017  ? Blepharitis of upper and lower eyelids of both eyes 12/16/2016  ? Hypertropia of right eye 12/16/2016  ? Hypotropia of left eye 09/08/2016   ? Pseudophakia of both eyes 07/22/2016  ? Cataract extraction status 06/03/2016  ? Diplopia 04/06/2016  ? 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 Ventricular tachycardia ([place]) 04/19/2014  ? Carotid stenosis 05/22/2011  ? CAD (coronary artery disease) 05/22/2011  ? Automatic implantable cardioverter-defibrillator in situ 12/05/2009  ? Presence of automatic (implantable) cardiac defibrillator 12/05/2009  ? HYPOTHYROIDISM 04/29/2009  ? COPD 12/01/2007  ? Essential hypertension 11/17/2007  ? MYOCARDIAL INFARCTION 11/17/2007  ? Chronic systolic congestive heart failure ([place]) 11/17/2007  ? ? ?Past Medical History:  ?Diagnosis Date  ? Atrial flutter ([place])   ? (I could not find documentation of this rhythm.)  ? AUTOMATIC IMPLANTABLE CARDIAC DEFIBRILLATOR SITU   ? Automatic implantable cardioverter-defibrillator in situ 12/05/2009  ? Qualifier: Diagnosis of  By: [name], MD, [facility], Binnie Kand   ? CAD (coronary artery disease) 05/22/2011  ? Carotid stenosis 05/22/2011  ? CHF CONGESTIVE HEART FAILURE   ? 45% by echo 2015  ? COPD 12/01/2007  ? Qualifier: Diagnosis of  By: [name] NP, [name]    ? DM2 (diabetes mellitus, type 2) ([place])   ? DYSLIPIDEMIA   ? DYSPNEA   ? Edema   ? Essential hypertension 11/17/2007  ? Qualifier: Diagnosis of  By: [name]    ? Gout   ? HYPERTENSION   ? HYPOTHYROIDISM   ? MYOCARDIAL INFARCTION   ? MI 1999, stent x 2 to RCA, 70% circ, 30 and 40 % LADs  ? S/P updgrade to CRT-D 11/23/2020  ? WEIGHT GAIN, ABNORMAL   ? ? ?Past Surgical History:  ?Procedure Laterality Date  ? ANGIOPLASTY    ? stent  ? APPENDECTOMY  1957  ? BIV UPGRADE N/A 11/22/2020  ? Procedure: BIV ICD UPGRADE;  Surgeon: Evans Lance, MD;  Location: [place] [facility];  Service: Cardiovascular;  Laterality: N/A;  ? CARDIAC DEFIBRILLATOR PLACEMENT    ? CAROTID STENT    ? EYE SURGERY  1985  ? LOWER EXTREMITY ANGIOGRAPHY N/A 05/04/2018  ? Procedure: LOWER EXTREMITY ANGIOGRAPHY;  Surgeon: [name], MD;  Location: [place] [facility];   Service: Cardiovascular;  Laterality: N/A;  ? PERIPHERAL VASCULAR INTERVENTION Bilateral 05/04/2018  ? Procedure: PERIPHERAL VASCULAR INTERVENTION;  Surgeon: [name], MD;  Location: [place] [facility];  Service: Cardiovascular;  Laterality: Bilateral;  [place]  ? ? ?Social History  ? ?Socioeconomic History  ? Marital status: Married  ?  Spouse name: Ila  ? Number of children: 3  ? Years of education: Not on file  ? Highest education level: Not on file  ?Occupational History  ? Not on file  ?Tobacco Use  ? Smoking status: Former  ?  Types: Cigarettes  ?  Quit date: 12/10/1983  ?  Years since quitting: 37.7  ? Smokeless tobacco: Never  ? Tobacco comments:  ?  quit 1985  ?Substance and Sexual Activity  ? Alcohol use: No  ? Drug use: No  ?  Sexual activity: Not on file  ?Other Topics Concern  ? Not on file  ?Social History Narrative  ? Not on file  ? ?Social Determinants of Health  ? ?Financial Resource Strain: Not on file  ?Food Insecurity: Not on file  ?Transportation Needs: Not on file  ?Physical Activity: Not on file  ?Stress: Not on file  ?Social Connections: Not on file  ?Intimate Partner Violence: Not on file  ? ? ?Family History  ?Problem Relation Age of Onset  ? Other Mother   ?     natural causes  ? Kidney disease Father   ? Cancer Brother   ?     throat cancer  ? ? ? ?Review of Systems  ?Constitutional: Negative.  Negative for chills and fever.  ?Respiratory:  Negative for shortness of breath.   ?Cardiovascular:  Negative for chest pain.  ?Gastrointestinal:  Negative for nausea and vomiting.  ?Neurological: Negative.  Negative for dizziness and headaches.  ?All other systems reviewed and are negative. ? ?Today's Vitals  ? 09/15/21 1557  ?BP: (!) 160/70  ?Pulse: (!) 52  ?Temp: 98.2 ?F (36.8 ?C)  ?SpO2: 98%  ?Weight: 164 lb 8 oz (74.6 kg)  ?Height: '5\' 8"'$  (1.727 m)  ? ?Body mass index is 25.01 kg/m?. ? ?Physical Exam ?Vitals reviewed.  ?Constitutional:   ?   Appearance: Normal appearance.  ?HENT:  ?    Head: Normocephalic.  ?Cardiovascular:  ?   Rate and Rhythm: Normal rate.  ?Pulmonary:  ?   Effort: Pulmonary effort is normal.  ?Skin: ?   General: Skin is warm and dry.  ?   Comments: Open wound to right

## 2021-09-15 NOTE — Patient Instructions (Signed)
Wound Infection A wound infection happens when germs start to grow in a wound. Germs that cause wound infections are most often bacteria. Other types of infections can occur as well. An infection can cause the wound to break open. Wound infections need treatment. If a wound infection is not treated, problems can happen. What are the causes? Most often caused by germs (bacteria) that grow in a wound. Other germs, such as yeast and funguses, can also cause wound infections. What increases the risk? Having a weak body defense system (immune system). Having diabetes. Taking certain medicines (steroids) for a long time. Smoking. Being an older person. Being overweight. Taking certain medicines for cancer treatment. What are the signs or symptoms? Having more redness, swelling, or pain at the wound site. Having more blood or fluid at the wound site. A bad smell coming from a wound or bandage (dressing). Having a fever. Feeling very tired. Having warmth at or around the wound. Having pus at the wound site. How is this treated? This condition is most often treated with an antibiotic medicine. The infection should improve 24-48 hours after you start antibiotics. After 24-48 hours, redness around the wound should stop spreading. The wound should also be less painful. Follow these instructions at home: Medicines Take or apply over-the-counter and prescription medicines only as told by your doctor. If you were prescribed an antibiotic medicine, take or apply it as told by your doctor. Do not stop using the antibiotic even if you start to feel better. Wound care  Clean the wound each day, or as told by your doctor. Wash the wound with mild soap and water. Rinse the wound with water to remove all soap. Pat the wound dry with a clean towel. Do not rub it. Follow instructions from your doctor about how to take care of your wound. Make sure you: Wash your hands with soap and water before and after  you change your bandage. If you cannot use soap and water, use hand sanitizer. Change your bandage as told by your doctor. Leave stitches (sutures), skin glue, or skin tape (adhesive) strips in place if your wound has been closed. They may need to stay in place for 2 weeks or longer. If tape strips get loose and curl up, you may trim the loose edges. Do not remove tape strips completely unless your doctor says it is okay. Some wounds are left open to heal on their own. Check your wound every day for signs of infection. Watch for: More redness, swelling, or pain. More fluid or blood. Warmth. Pus or a bad smell. General instructions Keep the bandage dry until your doctor says it can be removed. Do not take baths, swim, or use a hot tub until your doctor approves. Ask your doctor if you may take showers. You may only be allowed to take sponge baths. Raise (elevate) the injured area above the level of your heart while you are sitting or lying down. Do not scratch or pick at the wound. Keep all follow-up visits as told by your doctor. This is important. Contact a doctor if: Medicine does not help your pain. You have more redness, swelling, or pain around your wound. You have more fluid or blood coming from your wound. Your wound feels warm to the touch. You have pus coming from your wound. You notice a bad smell coming from your wound or your bandage. Your wound that was closed breaks open. Get help right away if: You have a red streak   going away from your wound. You have a fever. Summary A wound infection happens when germs start to grow in a wound. This condition is usually treated with an antibiotic medicine. Follow instructions from your doctor about how to take care of your wound. Contact a doctor if your wound infection does not start to get better in 24-48 hours, or your symptoms get worse. Keep all follow-up visits as told by your doctor. This is important. This information is not  intended to replace advice given to you by your health care provider. Make sure you discuss any questions you have with your health care provider. Document Revised: 03/13/2021 Document Reviewed: 03/13/2021 Elsevier Patient Education  2023 [employer]  

## 2021-09-16 DIAGNOSIS — C4442 Squamous cell carcinoma of skin of scalp and neck: Secondary | ICD-10-CM | POA: Diagnosis not present

## 2021-09-16 DIAGNOSIS — L02213 Cutaneous abscess of chest wall: Secondary | ICD-10-CM | POA: Diagnosis not present

## 2021-09-16 DIAGNOSIS — L570 Actinic keratosis: Secondary | ICD-10-CM | POA: Diagnosis not present

## 2021-09-22 DIAGNOSIS — G4733 Obstructive sleep apnea (adult) (pediatric): Secondary | ICD-10-CM | POA: Diagnosis not present

## 2021-09-24 NOTE — Progress Notes (Addendum)
[name] ([id number]) ?Visit Report for 09/26/2021 ?Allergy List Details ?Patient Name: Date of Service: ?Jonathon Slocumb W. 09/26/2021 1:15 PM ?Medical Record Number: [id number] ?Patient Account Number: [id number] ?Date of Birth/Sex: Treating RN: ?January 27, 1934 (88 y.o. [name] ?Primary Care Renea Schoonmaker: [name] Other Clinician: ?Referring [name]: ?Treating [name]/Extender: [name] ?[name] ?Weeks in Treatment: 0 ?Allergies ?Active Allergies ?Sulfa (Sulfonamide Antibiotics) ?prednisone ?Allergy Notes ?Electronic Signature(s) ?Signed: 09/24/2021 8:54:57 AM By: [name] ?Entered By: [name] on 09/24/2021 08:54:56 ?-------------------------------------------------------------------------------- ?Arrival Information Details ?Patient Name: Date of Service: ?Jonathon Slocumb W. 09/26/2021 1:15 PM ?Medical Record Number: [id number] ?Patient Account Number: [id number] ?Date of Birth/Sex: Treating RN: ?Feb 01, 1934 (88 y.o. M) [name], [name] ?Primary Care Blossom Crume: [name] Other Clinician: ?Referring [name]: ?Treating [name]/Extender: [name] ?[name] ?Weeks in Treatment: 0 ?Visit Information ?Patient Arrived: Ambulatory ?Arrival Time: 13:09 ?Accompanied By: daughter ?Transfer Assistance: None ?Patient Identification Verified: Yes ?Secondary Verification Process Completed: Yes ?Patient Requires Transmission-Based Precautions: No ?Patient Has Alerts: Yes ?Patient Alerts: [id number] to obtain;pain ?Electronic Signature(s) ?Signed: 09/26/2021 4:52:29 PM By: Jonathon Pilling RN, BSN ?Entered By: [name] on 09/26/2021 13:33:44 ?-------------------------------------------------------------------------------- ?Clinic Level of Care Assessment Details ?Patient Name: Date of Service: ?Jonathon Slocumb W. 09/26/2021 1:15 PM ?Medical Record Number: [id number] ?Patient Account Number: [id number] ?Date of Birth/Sex: Treating RN: ?1934-03-12 (88 y.o. M) [name], [name] ?Primary Care  Jonathon Oelkers: [name] Other Clinician: ?Referring [name]: ?Treating [name]/Extender: [name] ?[name] ?Weeks in Treatment: 0 ?Clinic Level of Care Assessment Items ?TOOL 1 Quantity Score ?X- 1 0 ?Use when EandM and Procedure is performed on INITIAL visit ?ASSESSMENTS - Nursing Assessment / Reassessment ?X- 1 20 ?General Physical Exam (combine w/ comprehensive assessment (listed just below) when performed on new pt. evals) ?X- 1 25 ?Comprehensive Assessment (HX, ROS, Risk Assessments, Wounds Hx, etc.) ?ASSESSMENTS - Wound and Skin Assessment / Reassessment ?X- 1 10 ?Dermatologic / Skin Assessment (not related to wound area) ?ASSESSMENTS - Ostomy and/or Continence Assessment and Care ?'[]'$  - 0 ?Incontinence Assessment and Management ?'[]'$  - 0 ?Ostomy Care Assessment and Management (repouching, etc.) ?PROCESS - Coordination of Care ?'[]'$  - 0 ?Simple Patient / Family Education for ongoing care ?X- 1 20 ?Complex (extensive) Patient / Family Education for ongoing care ?X- 1 10 ?Staff obtains Consents, Records, T Results / Process Orders ?est ?'[]'$  - 0 ?Staff telephones HHA, Nursing Homes / Clarify orders / etc ?'[]'$  - 0 ?Routine Transfer to another Facility (non-emergent condition) ?'[]'$  - 0 ?Routine Hospital Admission (non-emergent condition) ?X- 1 15 ?New Admissions / Biomedical engineer / Ordering NPWT Apligraf, etc. ?, ?'[]'$  - 0 ?Emergency Hospital Admission (emergent condition) ?PROCESS - Special Needs ?'[]'$  - 0 ?Pediatric / Minor Patient Management ?'[]'$  - 0 ?Isolation Patient Management ?'[]'$  - 0 ?Hearing / Language / Visual special needs ?'[]'$  - 0 ?Assessment of Community assistance (transportation, D/C planning, etc.) ?'[]'$  - 0 ?Additional assistance / Altered mentation ?'[]'$  - 0 ?Support Surface(s) Assessment (bed, cushion, seat, etc.) ?INTERVENTIONS - Miscellaneous ?'[]'$  - 0 ?External ear exam ?'[]'$  - 0 ?Patient Transfer (multiple staff / Civil Service fast streamer / Similar devices) ?'[]'$  - 0 ?Simple Staple / Suture removal (25 or  less) ?'[]'$  - 0 ?Complex Staple / Suture removal (26 or more) ?'[]'$  - 0 ?Hypo/Hyperglycemic Management (do not check if billed separately) ?X- 1 15 ?Ankle / Brachial Index (ABI) - do not check if billed separately ?Has the patient been seen at the hospital within the last three years: Yes ?Total Score: 115 ?Level Of Care: New/Established - Level  3 ?Electronic Signature(s) ?Signed: 09/26/2021 4:52:29 PM By: Jonathon Pilling RN, BSN ?Entered By: [name] on 09/26/2021 13:57:02 ?-------------------------------------------------------------------------------- ?Encounter Discharge Information Details ?Patient Name: ?Date of Service: ?Jonathon Slocumb W. 09/26/2021 1:15 PM ?Medical Record Number: [id number] ?Patient Account Number: [id number] ?Date of Birth/Sex: ?Treating RN: ?Apr 19, 1934 (88 y.o. M) [name], [name] ?Primary Care [name]: [name] ?Other Clinician: ?Referring Alvia Tory: ?Treating [name]/Extender: [name] ?[name] ?Weeks in Treatment: 0 ?Encounter Discharge Information Items Post Procedure Vitals ?Discharge Condition: Stable ?Temperature (F): 97.8 ?Ambulatory Status: Ambulatory ?Pulse (bpm): 63 ?Discharge Destination: Home ?Respiratory Rate (breaths/min): 20 ?Transportation: Private Auto ?Blood Pressure (mmHg): 161/71 ?Accompanied By: daughter ?Schedule Follow-up Appointment: Yes ?Clinical Summary of Care: ?Electronic Signature(s) ?Signed: 09/26/2021 4:52:29 PM By: Jonathon Pilling RN, BSN ?Entered By: [name] on 09/26/2021 16:43:55 ?-------------------------------------------------------------------------------- ?Lower Extremity Assessment Details ?Patient Name: ?Date of Service: ?Jonathon Slocumb W. 09/26/2021 1:15 PM ?Medical Record Number: [id number] ?Patient Account Number: [id number] ?Date of Birth/Sex: ?Treating RN: ?12/31/1933 (88 y.o. M) [name], [name] ?Primary Care Taina Landry: [name] ?Other Clinician: ?Referring Pamala Hayman: ?Treating Amily Depp/Extender: [name] ?[name] ?Weeks  in Treatment: 0 ?Edema Assessment ?Assessed: [Left: No] [Right: Yes] ?Edema: [Left: Ye] [Right: s] ?Calf ?Left: Right: ?Point of Measurement: 31 cm From Medial Instep 38.2 cm ?Ankle ?Left: Right: ?Point of Measurement: 7 cm From Medial Instep 28 cm ?Knee To Floor ?Left: Right: ?From Medial Instep 43 cm ?Vascular Assessment ?Pulses: ?Dorsalis Pedis ?Palpable: [Right:Yes] ?Doppler Audible: [Right:Yes] ?Posterior Tibial ?Palpable: [Right:Yes] ?Doppler Audible: [Right:Yes] ?Notes ?unable to obtain ABI- too painful. ?Electronic Signature(s) ?Signed: 09/26/2021 4:52:29 PM By: Jonathon Pilling RN, BSN ?Entered By: [name] on 09/26/2021 13:33:20 ?-------------------------------------------------------------------------------- ?Multi Wound Chart Details ?Patient Name: Date of Service: ?Jonathon Slocumb W. 09/26/2021 1:15 PM ?Medical Record Number: [id number] ?Patient Account Number: [id number] ?Date of Birth/Sex: Treating RN: ?01-Nov-1933 (88 y.o. [name] ?Primary Care Cele Mote: [name] Other Clinician: ?Referring Nole Robey: ?Treating Raghav Verrilli/Extender: [name] ?[name] ?Weeks in Treatment: 0 ?Vital Signs ?Height(in): 69 ?Pulse(bpm): 67 ?Weight(lbs): 168 ?Blood Pressure(mmHg): 161/71 ?Body Mass Index(BMI): 24.8 ?Temperature(??F): 97.8 ?Respiratory Rate(breaths/min): 20 ?Photos: [1:No Photos Right, Anterior Lower Leg] [N/A:N/A N/A] ?Wound Location: [1:Trauma] [N/A:N/A] ?Wounding Event: [1:Abrasion] [N/A:N/A] ?Primary Etiology: [1:Diabetic Wound/Ulcer of the Lower] [N/A:N/A] ?Secondary Etiology: [1:Extremity Cataracts, Anemia, Asthma, Chronic N/A] ?Comorbid History: [1:Obstructive Pulmonary Disease (COPD), Congestive Heart Failure, Coronary Artery Disease, Hypertension, Myocardial Infarction, Peripheral Arterial Disease, Type II Diabetes 07/02/2021] [N/A:N/A] ?Date Acquired: [1:0] [N/A:N/A] ?Weeks of Treatment: [1:Open] [N/A:N/A] ?Wound Status: [1:No] [N/A:N/A] ?Wound Recurrence: [1:2.7x1.7x0.4]  [N/A:N/A] ?Measurements L x W x D (cm) [1:3.605] [N/A:N/A] ?A (cm?) : ?rea [1:1.442] [N/A:N/A] ?Volume (cm?) : [1:12] ?Starting Position 1 (o'clock): [1:6] ?Ending Position 1 (o'clock): [1:1.5] ?Maximum Distance 1

## 2021-09-26 ENCOUNTER — Encounter (HOSPITAL_BASED_OUTPATIENT_CLINIC_OR_DEPARTMENT_OTHER): Payer: Medicare Other | Attending: Internal Medicine | Admitting: Internal Medicine

## 2021-09-26 DIAGNOSIS — I70238 Atherosclerosis of native arteries of right leg with ulceration of other part of lower right leg: Secondary | ICD-10-CM | POA: Diagnosis not present

## 2021-09-26 DIAGNOSIS — Z7902 Long term (current) use of antithrombotics/antiplatelets: Secondary | ICD-10-CM | POA: Diagnosis not present

## 2021-09-26 DIAGNOSIS — Z950 Presence of cardiac pacemaker: Secondary | ICD-10-CM | POA: Diagnosis not present

## 2021-09-26 DIAGNOSIS — I70235 Atherosclerosis of native arteries of right leg with ulceration of other part of foot: Secondary | ICD-10-CM | POA: Diagnosis not present

## 2021-09-26 DIAGNOSIS — L97812 Non-pressure chronic ulcer of other part of right lower leg with fat layer exposed: Secondary | ICD-10-CM | POA: Diagnosis not present

## 2021-09-26 DIAGNOSIS — L97818 Non-pressure chronic ulcer of other part of right lower leg with other specified severity: Secondary | ICD-10-CM | POA: Insufficient documentation

## 2021-09-26 DIAGNOSIS — I2510 Atherosclerotic heart disease of native coronary artery without angina pectoris: Secondary | ICD-10-CM | POA: Diagnosis not present

## 2021-09-26 DIAGNOSIS — I255 Ischemic cardiomyopathy: Secondary | ICD-10-CM | POA: Insufficient documentation

## 2021-09-26 DIAGNOSIS — I87311 Chronic venous hypertension (idiopathic) with ulcer of right lower extremity: Secondary | ICD-10-CM | POA: Insufficient documentation

## 2021-09-26 NOTE — Progress Notes (Signed)
[name] ([id number]) ?Visit Report for 09/26/2021 ?Abuse Risk Screen Details ?Patient Name: Date of Service: ?Jonathon Slocumb W. 09/26/2021 1:15 PM ?Medical Record Number: [id number] ?Patient Account Number: [id number] ?Date of Birth/Sex: Treating RN: ?12-29-1933 (88 y.o. M) [name], [name] ?Primary Care Riese Hellard: [name] Other Clinician: ?Referring Kerrigan Glendening: ?Treating Deyra Perdomo/Extender: Linton Ham ?[name] ?Weeks in Treatment: 0 ?Abuse Risk Screen Items ?Answer ?ABUSE RISK SCREEN: ?Has anyone close to you tried to hurt or harm you recentlyo No ?Do you feel uncomfortable with anyone in your familyo No ?Has anyone forced you do things that you didnt want to doo No ?Electronic Signature(s) ?Signed: 09/26/2021 4:52:29 PM By: Deon Pilling RN, BSN ?Entered By: Deon Pilling on 09/26/2021 13:15:15 ?-------------------------------------------------------------------------------- ?Activities of Daily Living Details ?Patient Name: Date of Service: ?Jonathon Slocumb W. 09/26/2021 1:15 PM ?Medical Record Number: [id number] ?Patient Account Number: [id number] ?Date of Birth/Sex: Treating RN: ?[birth year] (88 y.o. M) [name], [name] ?Primary Care Lusine Corlett: [name] Other Clinician: ?Referring Sriyan Cutting: ?Treating Sayer Masini/Extender: Linton Ham ?[name] ?Weeks in Treatment: 0 ?Activities of Daily Living Items ?Answer ?Activities of Daily Living (Please select one for each item) ?Drive Automobile Not Able ?T Medications ?ake Completely Able ?Use T elephone Completely Able ?Care for Appearance Completely Able ?Use T oilet Completely Able ?Bath / Shower Completely Able ?Dress Self Completely Able ?Feed Self Completely Able ?Walk Completely Able ?Get In / Out Bed Completely Able ?Housework Need Assistance ?Prepare Meals Need Assistance ?Handle Money Completely Able ?Shop for Self Need Assistance ?Electronic Signature(s) ?Signed: 09/26/2021 4:52:29 PM By: Deon Pilling RN, BSN ?Entered By: Deon Pilling on  09/26/2021 13:15:40 ?-------------------------------------------------------------------------------- ?Education Screening Details ?Patient Name: ?Date of Service: ?Jonathon Slocumb W. 09/26/2021 1:15 PM ?Medical Record Number: [id number] ?Patient Account Number: [id number] ?Date of Birth/Sex: ?Treating RN: ?[birth year] (88 y.o. M) [name], [name] ?Primary Care Keanna Tugwell: [name] ?Other Clinician: ?Referring Marilynn Ekstein: ?Treating Helia Haese/Extender: Linton Ham ?[name] ?Weeks in Treatment: 0 ?Primary Learner Assessed: Patient ?Learning Preferences/Education Level/Primary Language ?Learning Preference: Explanation, Demonstration, Printed Material ?Highest Education Level: College or Above ?Preferred Language: English ?Cognitive Barrier ?Language Barrier: No ?Translator Needed: No ?Memory Deficit: No ?Emotional Barrier: No ?Cultural/Religious Beliefs Affecting Medical Care: No ?Physical Barrier ?Impaired Vision: Yes Glasses ?Impaired Hearing: Yes Hearing Aid, bilateral ears ?Knowledge/Comprehension ?Knowledge Level: High ?Comprehension Level: High ?Ability to understand written instructions: High ?Ability to understand verbal instructions: High ?Motivation ?Anxiety Level: Calm ?Cooperation: Cooperative ?Education Importance: Acknowledges Need ?Interest in Health Problems: Asks Questions ?Perception: Coherent ?Willingness to Engage in Self-Management High ?Activities: ?Readiness to Engage in Self-Management High ?Activities: ?Electronic Signature(s) ?Signed: 09/26/2021 4:52:29 PM By: Deon Pilling RN, BSN ?Entered By: Deon Pilling on 09/26/2021 13:16:50 ?-------------------------------------------------------------------------------- ?Fall Risk Assessment Details ?Patient Name: ?Date of Service: ?Jonathon Slocumb W. 09/26/2021 1:15 PM ?Medical Record Number: [id number] ?Patient Account Number: [id number] ?Date of Birth/Sex: ?Treating RN: ?Jul 07, 1933 (88 y.o. M) [name], [name] ?Primary Care Konstantina Nachreiner: [name] ?Other  Clinician: ?Referring Sequan Auxier: ?Treating Javaeh Muscatello/Extender: Linton Ham ?[name] ?Weeks in Treatment: 0 ?Fall Risk Assessment Items ?Have you had 2 or more falls in the last 12 monthso 0 No ?Have you had any fall that resulted in injury in the last 12 monthso 0 No ?FALLS RISK SCREEN ?History of falling - immediate or within 3 months 25 Yes ?Secondary diagnosis (Do you have 2 or more medical diagnoseso) 0 No ?Ambulatory aid ?None/bed rest/wheelchair/nurse 0 Yes ?Crutches/cane/walker 0 No ?Furniture 0 No ?Intravenous therapy Access/Saline/Heparin Lock 0 No ?Gait/Transferring ?Normal/ bed rest/ wheelchair 0 Yes ?Weak (short steps with or without  shuffle, stooped but able to lift head while walking, may seek 0 No ?support from furniture) ?Impaired (short steps with shuffle, may have difficulty arising from chair, head down, impaired 0 No ?balance) ?Mental Status ?Oriented to own ability 0 Yes ?Electronic Signature(s) ?Signed: 09/26/2021 4:52:29 PM By: Deon Pilling RN, BSN ?Entered By: Deon Pilling on 09/26/2021 13:19:08 ?-------------------------------------------------------------------------------- ?Foot Assessment Details ?Patient Name: ?Date of Service: ?Jonathon Slocumb W. 09/26/2021 1:15 PM ?Medical Record Number: [id number] ?Patient Account Number: [id number] ?Date of Birth/Sex: ?Treating RN: ?Feb 15, 1934 (88 y.o. M) [name], [name] ?Primary Care Kathlen Sakurai: [name] ?Other Clinician: ?Referring Dorraine Ellender: ?Treating Deshan Hemmelgarn/Extender: Linton Ham ?[name] ?Weeks in Treatment: 0 ?Foot Assessment Items ?Site Locations ?+ = Sensation present, - = Sensation absent, C = Callus, U = Ulcer ?R = Redness, W = Warmth, M = Maceration, PU = Pre-ulcerative lesion ?F = Fissure, S = Swelling, D = Dryness ?Assessment ?Right: Left: ?Other Deformity: No No ?Prior Foot Ulcer: No No ?Prior Amputation: No No ?Charcot Joint: No No ?Ambulatory Status: Ambulatory Without Help ?Gait: Steady ?Electronic  Signature(s) ?Signed: 09/26/2021 4:52:29 PM By: Deon Pilling RN, BSN ?Entered By: Deon Pilling on 09/26/2021 13:23:53 ?-------------------------------------------------------------------------------- ?Nutrition Risk Screening Details ?Patient Name: ?Date of Service: ?Jonathon Slocumb W. 09/26/2021 1:15 PM ?Medical Record Number: [id number] ?Patient Account Number: [id number] ?Date of Birth/Sex: ?Treating RN: ?[birth year] (88 y.o. M) [name], [name] ?Primary Care Coleman Kalas: [name] ?Other Clinician: ?Referring Meridith Romick: ?Treating Brandalyn Harting/Extender: Linton Ham ?[name] ?Weeks in Treatment: 0 ?Height (in): 69 ?Weight (lbs): 168 ?Body Mass Index (BMI): 24.8 ?Nutrition Risk Screening Items ?Score Screening ?NUTRITION RISK SCREEN: ?I have an illness or condition that made me change the kind and/or amount of food I eat 2 Yes ?I eat fewer than two meals per day 0 No ?I eat few fruits and vegetables, or milk products 0 No ?I have three or more drinks of beer, liquor or wine almost every day 0 No ?I have tooth or mouth problems that make it hard for me to eat 0 No ?I don't always have enough money to buy the food I need 0 No ?I eat alone most of the time 0 No ?I take three or more different prescribed or over-the-counter drugs a day 1 Yes ?Without wanting to, I have lost or gained 10 pounds in the last six months 0 No ?I am not always physically able to shop, cook and/or feed myself 0 No ?Nutrition Protocols ?Good Risk Protocol ?Provide education on elevated blood ?Moderate Risk Protocol 0 sugars and impact on wound healing, ?as applicable ?High Risk Proctocol ?Risk Level: Moderate Risk ?Score: 3 ?Electronic Signature(s) ?Signed: 09/26/2021 4:52:29 PM By: Deon Pilling RN, BSN ?Entered By: Deon Pilling on 09/26/2021 13:19:20 ?

## 2021-09-29 DIAGNOSIS — S81801A Unspecified open wound, right lower leg, initial encounter: Secondary | ICD-10-CM | POA: Diagnosis not present

## 2021-10-03 ENCOUNTER — Encounter (HOSPITAL_BASED_OUTPATIENT_CLINIC_OR_DEPARTMENT_OTHER): Payer: Medicare Other | Attending: Internal Medicine | Admitting: Internal Medicine

## 2021-10-03 DIAGNOSIS — I2510 Atherosclerotic heart disease of native coronary artery without angina pectoris: Secondary | ICD-10-CM | POA: Diagnosis not present

## 2021-10-03 DIAGNOSIS — L080 Pyoderma: Secondary | ICD-10-CM | POA: Diagnosis not present

## 2021-10-03 DIAGNOSIS — I87311 Chronic venous hypertension (idiopathic) with ulcer of right lower extremity: Secondary | ICD-10-CM | POA: Insufficient documentation

## 2021-10-03 DIAGNOSIS — I70201 Unspecified atherosclerosis of native arteries of extremities, right leg: Secondary | ICD-10-CM

## 2021-10-03 DIAGNOSIS — S8011XA Contusion of right lower leg, initial encounter: Secondary | ICD-10-CM | POA: Diagnosis not present

## 2021-10-03 DIAGNOSIS — I110 Hypertensive heart disease with heart failure: Secondary | ICD-10-CM | POA: Insufficient documentation

## 2021-10-03 DIAGNOSIS — Z7902 Long term (current) use of antithrombotics/antiplatelets: Secondary | ICD-10-CM | POA: Insufficient documentation

## 2021-10-03 DIAGNOSIS — I509 Heart failure, unspecified: Secondary | ICD-10-CM | POA: Insufficient documentation

## 2021-10-03 DIAGNOSIS — I70238 Atherosclerosis of native arteries of right leg with ulceration of other part of lower right leg: Secondary | ICD-10-CM | POA: Diagnosis not present

## 2021-10-03 DIAGNOSIS — B965 Pseudomonas (aeruginosa) (mallei) (pseudomallei) as the cause of diseases classified elsewhere: Secondary | ICD-10-CM | POA: Diagnosis not present

## 2021-10-03 DIAGNOSIS — S8011XD Contusion of right lower leg, subsequent encounter: Secondary | ICD-10-CM | POA: Diagnosis not present

## 2021-10-03 DIAGNOSIS — L97818 Non-pressure chronic ulcer of other part of right lower leg with other specified severity: Secondary | ICD-10-CM

## 2021-10-03 DIAGNOSIS — E11622 Type 2 diabetes mellitus with other skin ulcer: Secondary | ICD-10-CM | POA: Insufficient documentation

## 2021-10-03 DIAGNOSIS — L97812 Non-pressure chronic ulcer of other part of right lower leg with fat layer exposed: Secondary | ICD-10-CM | POA: Diagnosis not present

## 2021-10-03 DIAGNOSIS — I255 Ischemic cardiomyopathy: Secondary | ICD-10-CM | POA: Insufficient documentation

## 2021-10-03 DIAGNOSIS — Z950 Presence of cardiac pacemaker: Secondary | ICD-10-CM | POA: Diagnosis not present

## 2021-10-03 NOTE — Progress Notes (Signed)
[name] ([id number]) ?Visit Report for 09/26/2021 ?Chief Complaint Document Details ?Patient Name: Date of Service: ?[name] W. 09/26/2021 1:15 PM ?Medical Record Number: [id number] ?Patient Account Number: [id number] ?Date of Birth/Sex: Treating RN: ?12-Jan-1934 (88 y.o. [name] ?Primary Care Provider: [name] Other Clinician: ?Referring Provider: ?Treating Provider/Extender: Linton Ham ?[name] ?Weeks in Treatment: 0 ?Information Obtained from: Patient ?Chief Complaint ?09/26/2021; patient is here for a review of a complicated wound on the right anterior lower leg which was initially secondary to trauma ?Electronic Signature(s) ?Signed: 09/26/2021 4:01:11 PM By: Linton Ham MD ?Entered By: Linton Ham on 09/26/2021 14:10:52 ?-------------------------------------------------------------------------------- ?Debridement Details ?Patient Name: Date of Service: ?[name] W. 09/26/2021 1:15 PM ?Medical Record Number: [id number] ?Patient Account Number: [id number] ?Date of Birth/Sex: Treating RN: ?18-Jul-1933 (88 y.o. [name] ?Primary Care Provider: [name] Other Clinician: ?Referring Provider: ?Treating Provider/Extender: Linton Ham ?[name] ?Weeks in Treatment: 0 ?Debridement Performed for Assessment: Wound #1 Right,Anterior Lower Leg ?Performed By: Physician Ricard Dillon., MD ?Debridement Type: Debridement ?Severity of Tissue Pre Debridement: Fat layer exposed ?Level of Consciousness (Pre-procedure): Awake and Alert ?Pre-procedure Verification/Time Out Yes - 13:50 ?Taken: ?Start Time: 13:51 ?Pain Control: Lidocaine 5% topical ointment ?T Area Debrided (L x W): ?otal 2.7 (cm) x 1.7 (cm) = 4.59 (cm?) ?Tissue and other material debrided: ?Viable, Non-Viable, Slough, Subcutaneous, Skin: Dermis , Skin: Epidermis, Fibrin/Exudate, Slough ?Level: Skin/Subcutaneous Tissue ?Debridement Description: Excisional ?Instrument: Curette ?Bleeding:  Moderate ?Hemostasis Achieved: Pressure ?End Time: 13:58 ?Procedural Pain: 0 ?Post Procedural Pain: 3 ?Response to Treatment: Procedure was tolerated well ?Level of Consciousness (Post- Awake and Alert ?procedure): ?Post Debridement Measurements of Total Wound ?Length: (cm) 2.7 ?Width: (cm) 1.7 ?Depth: (cm) 0.4 ?Volume: (cm?) 1.442 ?Character of Wound/Ulcer Post Debridement: Improved ?Severity of Tissue Post Debridement: Fat layer exposed ?Post Procedure Diagnosis ?Same as Pre-procedure ?Electronic Signature(s) ?Signed: 09/26/2021 4:01:11 PM By: Linton Ham MD ?Signed: 09/29/2021 5:13:39 PM By: Lorrin Jackson ?Entered By: Linton Ham on 09/26/2021 14:10:04 ?-------------------------------------------------------------------------------- ?HPI Details ?Patient Name: Date of Service: ?[name] W. 09/26/2021 1:15 PM ?Medical Record Number: [id number] ?Patient Account Number: [id number] ?Date of Birth/Sex: Treating RN: ?[birth year] (88 y.o. [name] ?Primary Care Provider: [name] Other Clinician: ?Referring Provider: ?Treating Provider/Extender: Linton Ham ?[name] ?Weeks in Treatment: 0 ?History of Present Illness ?HPI Description: ADMISSION ?09/26/21 ?This is a 88 year old man who lives near [place]. He is accompanied by his daughter who lives with him. His wife is also at home. Apparently 2 months ago he ?traumatized the right anterior lower leg x2 in quick succession including once on the back of a pickup truck or car. By description it sounds as though he had a ?black eschar on this which fell off about a month ago. He has been left with an open wound. It this is not that large but is completely slough covered and has ?undermining of at least a centimeter and half from 12-6 o'clock. He received a course of antibiotics earlier this month from his primary care doctor Ceftin. He ?is not on antibiotics currently. ?The patient has a complicated relevant history. 2019 he underwent bilateral  common iliac artery stenting and angioplasties by Dr. Carlis Abbott. He had ABIs at the ?same time which showed a ABI at 0.55 on the right and a TBI of 0.38. He had a TBI of 0.43 on the left they could not do an ABI because he could not tolerate ?the pressure cuff. He has not followed up. The patient is apparently a  diabetic although he has not been on any treatment in years and its not clear that he is ?even followed up. Other past medical history includes coronary artery disease, ischemic cardiomyopathy status post pacemaker, he is on Plavix, ?He did not cooperate with our attempt to do ABIs on the right leg in our clinic ?Electronic Signature(s) ?Signed: 09/26/2021 4:01:11 PM By: Linton Ham MD ?Entered By: Linton Ham on 09/26/2021 14:14:11 ?-------------------------------------------------------------------------------- ?Physical Exam Details ?Patient Name: Date of Service: ?[name] W. 09/26/2021 1:15 PM ?Medical Record Number: [id number] ?Patient Account Number: [id number] ?Date of Birth/Sex: Treating RN: ?1934/02/14 (88 y.o. [name] ?Primary Care Provider: [name] Other Clinician: ?Referring Provider: ?Treating Provider/Extender: Linton Ham ?[name] ?Weeks in Treatment: 0 ?Constitutional ?Patient is hypertensive.. Pulse regular and within target range for patient.Marland Kitchen Respirations regular, non-labored and within target range.. Temperature is normal and ?within the target range for the patient.Marland Kitchen Appears in no distress. ?Respiratory ?work of breathing is normal. Bilateral breath sounds are clear and equal in all lobes with no wheezes, rales or rhonchi.Marland Kitchen ?Cardiovascular ?Surprisingly normal JVP was visible but not elevated. His heart sounds are normal no gallops no sacral edema. Pedal pulses absent On the right. 2-3+ pitting ?edema. ?Integumentary (Hair, Skin) ?No evidence of infection. ?Notes ?Wound exam; right anterior mid tibia. Completely necrotic surface. Undermining from 12-6 is  noted. I used a #5 curette for a fairly aggressive subcutaneous ?debridement I was able to get a better looking wound surface there is no evidence of surrounding infection ?Electronic Signature(s) ?Signed: 09/26/2021 4:01:11 PM By: Linton Ham MD ?Entered By: Linton Ham on 09/26/2021 14:15:58 ?-------------------------------------------------------------------------------- ?Physician Orders Details ?Patient Name: ?Date of Service: ?[name] W. 09/26/2021 1:15 PM ?Medical Record Number: [id number] ?Patient Account Number: [id number] ?Date of Birth/Sex: ?Treating RN: ?1934/03/01 (88 y.o. M) [name], [name] ?Primary Care Provider: [name] ?Other Clinician: ?Referring Provider: ?Treating Provider/Extender: Linton Ham ?[name] ?Weeks in Treatment: 0 ?Verbal / Phone Orders: No ?Diagnosis Coding ?Follow-up Appointments ?ppointment in 1 week. - Dr. Heber [place] [facility] [place], Room 8 10/03/2021 1115 ?Return A ?Other: - Will try to get skilled nursing home health- leave dressing in place till home health approved. ?***[facility] will call you with appt times with tests and vascular referral.*** ?Bathing/ Shower/ Hygiene ?May shower with protection but do not get wound dressing(s) wet. ?Edema Control - Lymphedema / SCD / Other ?Elevate legs to the level of the heart or above for 30 minutes daily and/or when sitting, a frequency of: - 3-4 times a day throughout the day. ?Avoid standing for long periods of time. ?Exercise regularly ?Home Health ?Admit to [facility]. May utilize formulary equivalent dressing for wound treatment orders unless otherwise specified. ?New wound care orders this week; continue Home Health for wound care. May utilize formulary equivalent dressing for wound treatment ?orders unless otherwise specified. - two times a week- Monday and Wednesday. Wound Center weekly on Fridays. ?Wound Treatment ?Wound #1 - Lower Leg Wound Laterality: Right, Anterior ?Cleanser: Soap and  Water 3 x Per Week/30 Days ?Discharge Instructions: May shower and wash wound with dial antibacterial soap and water prior to dressing change. ?Cleanser: Wound Cleanser (DME) (Generic) 3 x Per Week/30 Days ?Discha

## 2021-10-03 NOTE — Progress Notes (Addendum)
[name] ([id number]) ?Visit Report for 10/03/2021 ?Arrival Information Details ?Patient Name: Date of Service: ?[name], Jonathon W. 10/03/2021 11:15 A M ?Medical Record Number: [id number] ?Patient Account Number: [id number] ?Date of Birth/Sex: Treating RN: ?11-09-1933 (88 y.o. Janyth Contes ?Primary Care Arra Connaughton: [name] Other Clinician: ?Referring Yahel Fuston: ?Treating Coner Gibbard/Extender: Kalman Shan ?[name] ?Weeks in Treatment: 1 ?Visit Information History Since Last Visit ?Added or deleted any medications: No ?Patient Arrived: Ambulatory ?Any new allergies or adverse reactions: No ?Arrival Time: 11:40 ?Had a fall or experienced change in No ?Accompanied By: daughter ?activities of daily living that may affect ?Transfer Assistance: None ?risk of falls: ?Patient Identification Verified: Yes ?Signs or symptoms of abuse/neglect since last visito No ?Secondary Verification Process Completed: Yes ?Hospitalized since last visit: No ?Patient Requires Transmission-Based Precautions: No ?Implantable device outside of the clinic excluding No ?Patient Has Alerts: Yes ?cellular tissue based products placed in the center ?Patient Alerts: [id number] to obtain;pain since last visit: ?Has Dressing in Place as Prescribed: Yes ?Has Compression in Place as Prescribed: Yes ?Pain Present Now: No ?Electronic Signature(s) ?Signed: 10/03/2021 1:22:09 PM By: Levan Hurst RN, BSN ?Entered By: Levan Hurst on 10/03/2021 11:40:42 ?-------------------------------------------------------------------------------- ?Clinic Level of Care Assessment Details ?Patient Name: Date of Service: ?[name], Jonathon W. 10/03/2021 11:15 A M ?Medical Record Number: [id number] ?Patient Account Number: [id number] ?Date of Birth/Sex: Treating RN: ?April 10, 1934 (88 y.o. M) [name], [name] ?Primary Care Kalissa Grays: [name] Other Clinician: ?Referring Davita Sublett: ?Treating Laquitha Heslin/Extender: Kalman Shan ?[name] ?Weeks in Treatment: 1 ?Clinic  Level of Care Assessment Items ?TOOL 4 Quantity Score ?X- 1 0 ?Use when only an EandM is performed on FOLLOW-UP visit ?ASSESSMENTS - Nursing Assessment / Reassessment ?X- 1 10 ?Reassessment of Co-morbidities (includes updates in patient status) ?X- 1 5 ?Reassessment of Adherence to Treatment Plan ?ASSESSMENTS - Wound and Skin A ssessment / Reassessment ?X - Simple Wound Assessment / Reassessment - one wound 1 5 ?'[]'$  - 0 ?Complex Wound Assessment / Reassessment - multiple wounds ?X- 1 10 ?Dermatologic / Skin Assessment (not related to wound area) ?ASSESSMENTS - Focused Assessment ?X- 1 5 ?Circumferential Edema Measurements - multi extremities ?X- 1 10 ?Nutritional Assessment / Counseling / Intervention ?'[]'$  - 0 ?Lower Extremity Assessment (monofilament, tuning fork, pulses) ?'[]'$  - 0 ?Peripheral Arterial Disease Assessment (using hand held doppler) ?ASSESSMENTS - Ostomy and/or Continence Assessment and Care ?'[]'$  - 0 ?Incontinence Assessment and Management ?'[]'$  - 0 ?Ostomy Care Assessment and Management (repouching, etc.) ?PROCESS - Coordination of Care ?X - Simple Patient / Family Education for ongoing care 1 15 ?'[]'$  - 0 ?Complex (extensive) Patient / Family Education for ongoing care ?X- 1 10 ?Staff obtains Consents, Records, T Results / Process Orders ?est ?X- 1 10 ?Staff telephones HHA, Nursing Homes / Clarify orders / etc ?'[]'$  - 0 ?Routine Transfer to another Facility (non-emergent condition) ?'[]'$  - 0 ?Routine Hospital Admission (non-emergent condition) ?'[]'$  - 0 ?New Admissions / Biomedical engineer / Ordering NPWT Apligraf, etc. ?, ?'[]'$  - 0 ?Emergency Hospital Admission (emergent condition) ?X- 1 10 ?Simple Discharge Coordination ?'[]'$  - 0 ?Complex (extensive) Discharge Coordination ?PROCESS - Special Needs ?'[]'$  - 0 ?Pediatric / Minor Patient Management ?'[]'$  - 0 ?Isolation Patient Management ?'[]'$  - 0 ?Hearing / Language / Visual special needs ?'[]'$  - 0 ?Assessment of Community assistance (transportation, D/C planning,  etc.) ?'[]'$  - 0 ?Additional assistance / Altered mentation ?'[]'$  - 0 ?Support Surface(s) Assessment (bed, cushion, seat, etc.) ?INTERVENTIONS - Wound Cleansing / Measurement ?X - Simple Wound Cleansing - one  wound 1 5 ?'[]'$  - 0 ?Complex Wound Cleansing - multiple wounds ?X- 1 5 ?Wound Imaging (photographs - any number of wounds) ?'[]'$  - 0 ?Wound Tracing (instead of photographs) ?X- 1 5 ?Simple Wound Measurement - one wound ?'[]'$  - 0 ?Complex Wound Measurement - multiple wounds ?INTERVENTIONS - Wound Dressings ?'[]'$  - 0 ?Small Wound Dressing one or multiple wounds ?'[]'$  - 0 ?Medium Wound Dressing one or multiple wounds ?X- 1 20 ?Large Wound Dressing one or multiple wounds ?X- 1 5 ?Application of Medications - topical ?'[]'$  - 0 ?Application of Medications - injection ?INTERVENTIONS - Miscellaneous ?'[]'$  - 0 ?External ear exam ?'[]'$  - 0 ?Specimen Collection (cultures, biopsies, blood, body fluids, etc.) ?'[]'$  - 0 ?Specimen(s) / Culture(s) sent or taken to Lab for analysis ?'[]'$  - 0 ?Patient Transfer (multiple staff / Civil Service fast streamer / Similar devices) ?'[]'$  - 0 ?Simple Staple / Suture removal (25 or less) ?'[]'$  - 0 ?Complex Staple / Suture removal (26 or more) ?'[]'$  - 0 ?Hypo / Hyperglycemic Management (close monitor of Blood Glucose) ?'[]'$  - 0 ?Ankle / Brachial Index (ABI) - do not check if billed separately ?X- 1 5 ?Vital Signs ?Has the patient been seen at the hospital within the last three years: Yes ?Total Score: 135 ?Level Of Care: New/Established - Level 4 ?Electronic Signature(s) ?Signed: 10/03/2021 2:28:26 PM By: Deon Pilling RN, BSN ?Entered By: Deon Pilling on 10/03/2021 12:25:02 ?-------------------------------------------------------------------------------- ?Encounter Discharge Information Details ?Patient Name: Date of Service: ?[name], Jonathon W. 10/03/2021 11:15 A M ?Medical Record Number: [id number] ?Patient Account Number: [id number] ?Date of Birth/Sex: Treating RN: ?12/04/1933 (88 y.o. M) [name], [name] ?Primary Care Jsiah Menta: [name]  Other Clinician: ?Referring Wafaa Deemer: ?Treating Takako Minckler/Extender: Kalman Shan ?[name] ?Weeks in Treatment: 1 ?Encounter Discharge Information Items ?Discharge Condition: Stable ?Ambulatory Status: Ambulatory ?Discharge Destination: Home ?Transportation: Private Auto ?Accompanied By: self ?Schedule Follow-up Appointment: Yes ?Clinical Summary of Care: ?Electronic Signature(s) ?Signed: 10/03/2021 2:28:26 PM By: Deon Pilling RN, BSN ?Entered By: Deon Pilling on 10/03/2021 12:25:29 ?-------------------------------------------------------------------------------- ?Lower Extremity Assessment Details ?Patient Name: Date of Service: ?[name], Jonathon W. 10/03/2021 11:15 A M ?Medical Record Number: [id number] ?Patient Account Number: [id number] ?Date of Birth/Sex: Treating RN: ?Nov 25, 1933 (88 y.o. Janyth Contes ?Primary Care Iram Astorino: [name] Other Clinician: ?Referring Ndea Kilroy: ?Treating Shelina Luo/Extender: Kalman Shan ?[name] ?Weeks in Treatment: 1 ?Edema Assessment ?Assessed: [Left: No] [Right: No] ?Edema: [Left: Ye] [Right: s] ?Calf ?Left: Right: ?Point of Measurement: 31 cm From Medial Instep 29.8 cm ?Ankle ?Left: Right: ?Point of Measurement: 7 cm From Medial Instep 24 cm ?Vascular Assessment ?Pulses: ?Dorsalis Pedis ?Palpable: [Right:Yes] ?Electronic Signature(s) ?Signed: 10/03/2021 1:22:09 PM By: Levan Hurst RN, BSN ?Entered By: Levan Hurst on 10/03/2021 11:44:57 ?-------------------------------------------------------------------------------- ?Multi Wound Chart Details ?Patient Name: ?Date of Service: ?[name], Jonathon W. 10/03/2021 11:15 A M ?Medical Record Number: [id number] ?Patient Account Number: [id number] ?Date of Birth/Sex: ?Treating RN: ?Jul 28, 1933 (88 y.o. M) [name], [name] ?Primary Care Clea Dubach: [name] ?Other Clinician: ?Referring Younique Casad: ?Treating Polk Minor/Extender: Kalman Shan ?[name] ?Weeks in Treatment: 1 ?Vital Signs ?Height(in): 69 ?Pulse(bpm):  63 ?Weight(lbs): 168 ?Blood Pressure(mmHg): 148/74 ?Body Mass Index(BMI): 24.8 ?Temperature(??F): 97.8 ?Respiratory Rate(breaths/min): 18 ?Photos: [N/A:N/A] ?Right, Anterior Lower Leg N/A N/A ?Wound Location: ?[facility]

## 2021-10-06 NOTE — Progress Notes (Signed)
[name] ([id number]) ?Visit Report for 10/03/2021 ?Chief Complaint Document Details ?Patient Name: Date of Service: ?[name], Jonathon W. 10/03/2021 11:15 A M ?Medical Record Number: [id number] ?Patient Account Number: [id number] ?Date of Birth/Sex: Treating RN: ?May 27, 1934 (88 y.o. M) [name], [name] ?Primary Care Provider: [name] Other Clinician: ?Referring Provider: ?Treating Provider/Extender: Kalman Shan ?[name] ?Weeks in Treatment: 1 ?Information Obtained from: Patient ?Chief Complaint ?09/26/2021; patient is here for a review of a complicated wound on the right anterior lower leg which was initially secondary to trauma ?Electronic Signature(s) ?Signed: 10/06/2021 11:06:06 AM By: Kalman Shan DO ?Entered By: Kalman Shan on 10/06/2021 10:11:22 ?-------------------------------------------------------------------------------- ?HPI Details ?Patient Name: Date of Service: ?[name], Jonathon W. 10/03/2021 11:15 A M ?Medical Record Number: [id number] ?Patient Account Number: [id number] ?Date of Birth/Sex: Treating RN: ?[birth year] (88 y.o. M) [name], [name] ?Primary Care Provider: [name] Other Clinician: ?Referring Provider: ?Treating Provider/Extender: Kalman Shan ?[name] ?Weeks in Treatment: 1 ?History of Present Illness ?HPI Description: ADMISSION ?09/26/21 ?This is a 88 year old man who lives near [place]. He is accompanied by his daughter who lives with him. His wife is also at home. Apparently 2 months ago he ?traumatized the right anterior lower leg x2 in quick succession including once on the back of a pickup truck or car. By description it sounds as though he had a ?black eschar on this which fell off about a month ago. He has been left with an open wound. It this is not that large but is completely slough covered and has ?undermining of at least a centimeter and half from 12-6 o'clock. He received a course of antibiotics earlier this month from his primary care doctor Ceftin.  He ?is not on antibiotics currently. ?The patient has a complicated relevant history. 2019 he underwent bilateral common iliac artery stenting and angioplasties by Dr. Carlis Abbott. He had ABIs at the ?same time which showed a ABI at 0.55 on the right and a TBI of 0.38. He had a TBI of 0.43 on the left they could not do an ABI because he could not tolerate ?the pressure cuff. He has not followed up. The patient is apparently a diabetic although he has not been on any treatment in years and its not clear that he is ?even followed up. Other past medical history includes coronary artery disease, ischemic cardiomyopathy status post pacemaker, he is on Plavix, ?He did not cooperate with our attempt to do ABIs on the right leg in our clinic ?5/5; patient presents for follow-up. Last week he was started on Iodosorb under Kerlix/Coban. He has no issues or complaints today. He has his venous and ?arterial studies scheduled for next week. He currently denies signs of infection. ?Electronic Signature(s) ?Signed: 10/06/2021 11:06:06 AM By: Kalman Shan DO ?Entered By: Kalman Shan on 10/06/2021 10:12:17 ?-------------------------------------------------------------------------------- ?Physical Exam Details ?Patient Name: Date of Service: ?[name], Jonathon W. 10/03/2021 11:15 A M ?Medical Record Number: [id number] ?Patient Account Number: [id number] ?Date of Birth/Sex: Treating RN: ?03/04/1934 (88 y.o. M) [name], [name] ?Primary Care Provider: [name] Other Clinician: ?Referring Provider: ?Treating Provider/Extender: Kalman Shan ?[name] ?Weeks in Treatment: 1 ?Constitutional ?respirations regular, non-labored and within target range for patient.Marland Kitchen ?Cardiovascular ?2+ dorsalis pedis/posterior tibialis pulses. ?Psychiatric ?pleasant and cooperative. ?Notes ?Right lower extremity: T the anterior mid tibia there is an open wound with slightly pale granulation tissue. There is undermining from 12-6. No surrounding  soft ?o ?tissue infection. ?Electronic Signature(s) ?Signed: 10/06/2021 11:06:06 AM By: Kalman Shan DO ?Entered By: Kalman Shan on 10/06/2021 10:13:31 ?-------------------------------------------------------------------------------- ?  Physician Orders Details ?Patient Name: Date of Service: ?[name], Jonathon W. 10/03/2021 11:15 A M ?Medical Record Number: [id number] ?Patient Account Number: [id number] ?Date of Birth/Sex: Treating RN: ?Jan 31, 1934 (88 y.o. 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 - Dr. Heber [place] and [facility], Room 9 1000 Tuesday 10/07/2021 ?Return A ?Other: - Will try to get skilled nursing home health- leave dressing in place till home health approved. ?***Vein and Vascular keep those appt times with tests.*** ensure to remove the dressing and compression wrap before the tests and cover the wound ?with a bandage. ?***Culture will be done. If positive will send the culture the topical antibiotic compounding from [facility].*** ?Bathing/ Shower/ Hygiene ?May shower with protection but do not get wound dressing(s) wet. ?Edema Control - Lymphedema / SCD / Other ?Elevate legs to the level of the heart or above for 30 minutes daily and/or when sitting, a frequency of: - 3-4 times a day throughout the day. ?Avoid standing for long periods of time. ?Exercise regularly ?Home Health ?Admit to [facility] for wound care. May utilize  formulary equivalent dressing for wound treatment orders unless otherwise specified. ?New wound care orders this week; continue Home Health for wound care. May utilize formulary equivalent dressing for wound treatment ?orders unless otherwise specified. - two times a week- Monday and Wednesday. Wound Center weekly on Fridays. ?Wound Treatment ?Wound #1 - Lower Leg Wound Laterality: Right, Anterior ?Cleanser: Soap and Water 3 x Per Week/30 Days ?Discharge Instructions: May shower and wash wound with dial antibacterial soap and water prior to dressing change. ?Cleanser: Wound Cleanser (Generic) 3 x Per Week/30 Days ?Discharge Instructions: Cleanse the wound with wound cleanser prior to applying a clean dressing using gauze sponges, not tissue or cotton balls. ?Peri-Wound Care: Sween Lotion (Moisturizing lotion) 3 x Per Week/30 Days ?Discharge Instructions: Apply moisturizing lotion as directed ?Topical: Gentamicin 3 x Per Week/30 Days ?Discharge Instructions: ***In clinic only*** ?Topical: Mupirocin Ointment 3 x Per Week/30 Days ?Discharge Instructions: Apply Mupirocin (Bactroban) in ?Prim Dressing: Hydrofera Blue Ready Foam, 2.5 x2.5 in 3 x Per Week/30 Days ?ary ?Discharge Instructions: Apply to wound bed as instructed ?Secondary Dressing: ABD Pad, 8x10 (Generic) 3 x Per Week/30 Days ?Discharge Instructions: Apply over primary dressing as directed. ?Compression Wrap: Kerlix Roll 4.5x3.1 (in/yd) (Generic) 3 x Per Week/30 Days ?Discharge Instructions: Apply Kerlix and Coban compression as directed. ?Compression Wrap: Coban Self-Adherent Wrap 4x5 (in/yd) (Generic) 3 x Per Week/30 Days ?Discharge Instructions: Apply over Kerlix as directed. ?Laboratory ?naerobe culture (MICRO) - PCR culture of right leg wound. ?Bacteria identified in Unspecified specimen by A ?LOINC Code: 818-2 ?Convenience Name: Anaerobic culture ?Electronic Signature(s) ?Signed: 10/06/2021 11:06:06 AM By: Kalman Shan DO ?Previous Signature: 10/03/2021  2:28:26 PM Version By: Deon Pilling RN, BSN ?Entered By: Kalman Shan on 10/06/2021 10:13:44 ?-------------------------------------------------------------------------------- ?Problem List Details ?Patient

## 2021-10-07 ENCOUNTER — Encounter (HOSPITAL_BASED_OUTPATIENT_CLINIC_OR_DEPARTMENT_OTHER): Payer: Medicare Other | Admitting: Internal Medicine

## 2021-10-09 ENCOUNTER — Other Ambulatory Visit (HOSPITAL_COMMUNITY): Payer: Self-pay | Admitting: Internal Medicine

## 2021-10-09 DIAGNOSIS — I739 Peripheral vascular disease, unspecified: Secondary | ICD-10-CM

## 2021-10-09 DIAGNOSIS — I872 Venous insufficiency (chronic) (peripheral): Secondary | ICD-10-CM

## 2021-10-10 ENCOUNTER — Ambulatory Visit (INDEPENDENT_AMBULATORY_CARE_PROVIDER_SITE_OTHER)
Admission: RE | Admit: 2021-10-10 | Discharge: 2021-10-10 | Disposition: A | Discharge status: Home / Self Care | Payer: Medicare Other | Source: Ambulatory Visit | Attending: Internal Medicine | Admitting: Internal Medicine

## 2021-10-10 ENCOUNTER — Ambulatory Visit (HOSPITAL_COMMUNITY)
Admission: RE | Admit: 2021-10-10 | Discharge: 2021-10-10 | Disposition: A | Discharge status: Home / Self Care | Payer: Medicare Other | Source: Ambulatory Visit | Attending: Internal Medicine | Admitting: Internal Medicine

## 2021-10-10 DIAGNOSIS — I872 Venous insufficiency (chronic) (peripheral): Secondary | ICD-10-CM | POA: Insufficient documentation

## 2021-10-10 DIAGNOSIS — I739 Peripheral vascular disease, unspecified: Secondary | ICD-10-CM | POA: Insufficient documentation

## 2021-10-14 ENCOUNTER — Encounter (HOSPITAL_BASED_OUTPATIENT_CLINIC_OR_DEPARTMENT_OTHER): Payer: Medicare Other | Admitting: Internal Medicine

## 2021-10-14 DIAGNOSIS — L97818 Non-pressure chronic ulcer of other part of right lower leg with other specified severity: Secondary | ICD-10-CM | POA: Diagnosis not present

## 2021-10-14 DIAGNOSIS — I2510 Atherosclerotic heart disease of native coronary artery without angina pectoris: Secondary | ICD-10-CM | POA: Diagnosis not present

## 2021-10-14 DIAGNOSIS — Z950 Presence of cardiac pacemaker: Secondary | ICD-10-CM | POA: Diagnosis not present

## 2021-10-14 DIAGNOSIS — I509 Heart failure, unspecified: Secondary | ICD-10-CM | POA: Diagnosis not present

## 2021-10-14 DIAGNOSIS — L97812 Non-pressure chronic ulcer of other part of right lower leg with fat layer exposed: Secondary | ICD-10-CM | POA: Diagnosis not present

## 2021-10-14 DIAGNOSIS — Z7902 Long term (current) use of antithrombotics/antiplatelets: Secondary | ICD-10-CM | POA: Diagnosis not present

## 2021-10-14 DIAGNOSIS — S8011XA Contusion of right lower leg, initial encounter: Secondary | ICD-10-CM | POA: Diagnosis not present

## 2021-10-14 DIAGNOSIS — I255 Ischemic cardiomyopathy: Secondary | ICD-10-CM | POA: Diagnosis not present

## 2021-10-14 DIAGNOSIS — I87311 Chronic venous hypertension (idiopathic) with ulcer of right lower extremity: Secondary | ICD-10-CM

## 2021-10-14 DIAGNOSIS — S8011XD Contusion of right lower leg, subsequent encounter: Secondary | ICD-10-CM | POA: Diagnosis not present

## 2021-10-14 DIAGNOSIS — I70238 Atherosclerosis of native arteries of right leg with ulceration of other part of lower right leg: Secondary | ICD-10-CM

## 2021-10-14 DIAGNOSIS — S81801A Unspecified open wound, right lower leg, initial encounter: Secondary | ICD-10-CM | POA: Diagnosis not present

## 2021-10-14 DIAGNOSIS — I110 Hypertensive heart disease with heart failure: Secondary | ICD-10-CM | POA: Diagnosis not present

## 2021-10-14 DIAGNOSIS — E11622 Type 2 diabetes mellitus with other skin ulcer: Secondary | ICD-10-CM | POA: Diagnosis not present

## 2021-10-14 NOTE — Progress Notes (Signed)
[name] ([id number]) ?Visit Report for 10/14/2021 ?Chief Complaint Document Details ?Patient Name: Date of Service: ?[name], Jonathon W. 10/14/2021 8:15 A M ?Medical Record Number: [id number] ?Patient Account Number: [id number] ?Date of Birth/Sex: Treating RN: ?[birth year] (88 y.o. M) [name], [name] ?Primary Care Provider: [name] Other Clinician: ?Referring Provider: ?Treating Provider/Extender: Kalman Shan ?[name] ?Weeks in Treatment: 2 ?Information Obtained from: Patient ?Chief Complaint ?09/26/2021; patient is here for a review of a complicated wound on the right anterior lower leg which was initially secondary to trauma ?Electronic Signature(s) ?Signed: 10/14/2021 10:40:18 AM By: Kalman Shan DO ?Entered By: Kalman Shan on 10/14/2021 09:40:12 ?-------------------------------------------------------------------------------- ?HPI Details ?Patient Name: Date of Service: ?[name], Obi W. 10/14/2021 8:15 A M ?Medical Record Number: [id number] ?Patient Account Number: [id number] ?Date of Birth/Sex: Treating RN: ?[birth year] (88 y.o. M) [name], [name] ?Primary Care Provider: [name] Other Clinician: ?Referring Provider: ?Treating Provider/Extender: Kalman Shan ?[name] ?Weeks in Treatment: 2 ?History of Present Illness ?HPI Description: ADMISSION ?09/26/21 ?This is a 88 year old man who lives near [place]. He is accompanied by his daughter who lives with him. His wife is also at home. Apparently 2 months ago he ?traumatized the right anterior lower leg x2 in quick succession including once on the back of a pickup truck or car. By description it sounds as though he had a ?black eschar on this which fell off about a month ago. He has been left with an open wound. It this is not that large but is completely slough covered and has ?undermining of at least a centimeter and half from 12-6 o'clock. He received a course of antibiotics earlier this month from his primary care doctor Ceftin.  He ?is not on antibiotics currently. ?The patient has a complicated relevant history. 2019 he underwent bilateral common iliac artery stenting and angioplasties by Dr. Carlis Abbott. He had ABIs at the ?same time which showed a ABI at 0.55 on the right and a TBI of 0.38. He had a TBI of 0.43 on the left they could not do an ABI because he could not tolerate ?the pressure cuff. He has not followed up. The patient is apparently a diabetic although he has not been on any treatment in years and its not clear that he is ?even followed up. Other past medical history includes coronary artery disease, ischemic cardiomyopathy status post pacemaker, he is on Plavix, ?He did not cooperate with our attempt to do ABIs on the right leg in our clinic ?5/5; patient presents for follow-up. Last week he was started on Iodosorb under Kerlix/Coban. He has no issues or complaints today. He has his venous and ?arterial studies scheduled for next week. He currently denies signs of infection. ?5/16; patient presents for follow-up. Last week he was started on Hydrofera Blue and gentamicin and mupirocin ointment under Kerlix/Coban. He tolerated this ?well. He had a PCR culture that reported high levels of Streptococcus agalactiae, Pseudomonas aeruginosa and Enterococcus bacillus. Keystone antibiotics ?were ordered and he has this however did not bring it today. He had ABIs with TBI's and venous reflux studies. He had evidence of reflux throughout the right ?and left venous system. His ABIs on the right was noncompressible with a TBI of 0.19. He currently denies systemic signs of infection. ?Electronic Signature(s) ?Signed: 10/14/2021 10:40:18 AM By: Kalman Shan DO ?Entered By: Kalman Shan on 10/14/2021 09:42:13 ?-------------------------------------------------------------------------------- ?Physical Exam Details ?Patient Name: Date of Service: ?[name], Jonathon W. 10/14/2021 8:15 A M ?Medical Record Number: [id number] ?Patient Account Number:  [id number] ?Date of  Birth/Sex: Treating RN: ?1934-02-19 (88 y.o. M) [name], [name] ?Primary Care Provider: [name] Other Clinician: ?Referring Provider: ?Treating Provider/Extender: Kalman Shan ?[name] ?Weeks in Treatment: 2 ?Constitutional ?respirations regular, non-labored and within target range for patient.Marland Kitchen ?Cardiovascular ?2+ dorsalis pedis/posterior tibialis pulses. ?Psychiatric ?pleasant and cooperative. ?Notes ?Right lower extremity: T the anterior mid tibia there is an open wound with granulation tissue And scant nonviable tissue. There is undermining from 12-6. No ?o ?surrounding soft tissue infection. ?Electronic Signature(s) ?Signed: 10/14/2021 10:40:18 AM By: Kalman Shan DO ?Entered By: Kalman Shan on 10/14/2021 09:42:52 ?-------------------------------------------------------------------------------- ?Physician Orders Details ?Patient Name: Date of Service: ?[name], Jonathon W. 10/14/2021 8:15 A M ?Medical Record Number: [id number] ?Patient Account Number: [id number] ?Date of Birth/Sex: Treating RN: ?Mar 29, 1934 (88 y.o. 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 - Dr. Heber [place] and [place], Room 8 10/21/2021 3pm Tuesday ?Return A ?Other: - Dr. Gwenlyn Found office will call you to schedule an appointment related to poor arterial and venous studies. ?***Keystone topical antibiotic apply daily.*** ?Bathing/ Shower/  Hygiene ?May shower with protection but do not get wound dressing(s) wet. ?Edema Control - Lymphedema / SCD / Other ?Elevate legs to the level of the heart or above for 30 minutes daily and/or when sitting, a frequency of: - 3-4 times a day throughout the day. ?Avoid standing for long periods of time. ?Exercise regularly ?Wound Treatment ?Wound #1 - Lower Leg Wound Laterality: Right, Anterior ?Cleanser: Soap and Water 1 x Per Day/30 Days ?Discharge Instructions: May shower and wash wound with dial antibacterial soap and water prior to dressing change. ?Cleanser: Wound Cleanser (Generic) 1 x Per Day/30 Days ?Discharge Instructions: Cleanse the wound with wound cleanser prior to applying a clean dressing using gauze sponges, not tissue or cotton balls. ?Peri-Wound Care: Skin Prep (DME) (Generic) 1 x Per Day/30 Days ?Discharge Instructions: Use skin prep as directed ?Peri-Wound Care: Sween Lotion (Moisturizing lotion) 1 x Per Day/30 Days ?Discharge Instructions: Apply moisturizing lotion as directed ?Topical: Gentamicin 1 x Per Day/30 Days ?Discharge Instructions: ***In clinic only*** ?Topical: Mupirocin Ointment 1 x Per Day/30 Days ?Discharge Instructions: Apply Mupirocin (Bactroban) in office only. ?Topical: [facility]stone compounding topical antibiotics 1 x Per Day/30 Days ?Discharge Instructions: apply daily directly to wound bed. ?Secondary Dressing: Woven Gauze Sponge, Non-Sterile 4x4 in (DME) (Generic) 1 x Per Day/30 Days ?Discharge Instructions: Apply over primary dressing as directed. ?Secondary Dressing: Zetuvit Plus Silicone Border Dressing 4x4 (in/in) (DME) (Generic) 1 x Per Day/30 Days ?Discharge Instructions: Apply silicone border over primary dressing as directed. ?Consults ?Vascular- Dr. Gwenlyn Found Office - ***Urgent Referral*** to Dr. Gwenlyn Found office related to poor arterial blood flow and venous insufficiency. Both arterial and ?venous studies attached. - (ICD10 I70.201 - Unspecified atherosclerosis of native  arteries of extremities, right leg) ?Electronic Signature(s) ?Signed: 10/14/2021 10:40:18 AM By: Kalman Shan DO ?Entered By: Kalman Shan on 10/14/2021 09:43:02 ?Prescription 10/14/2021 ?----------------

## 2021-10-14 NOTE — Progress Notes (Signed)
[name] ([id number]) ?Visit Report for 10/14/2021 ?Arrival Information Details ?Patient Name: Date of Service: ?[name], Jonathon W. 10/14/2021 8:15 A M ?Medical Record Number: [id number] ?Patient Account Number: [id number] ?Date of Birth/Sex: Treating RN: ?1933-09-06 (88 y.o. M) [name], [name] ?Primary Care [name]: [name] Other Clinician: ?Referring Ronan Dion: ?Treating Nilo Fallin/Extender: Kalman Shan ?[name] ?Weeks in Treatment: 2 ?Visit Information History Since Last Visit ?All ordered tests and consults were completed: Yes ?Patient Arrived: Ambulatory ?Added or deleted any medications: No ?Arrival Time: 08:24 ?Any new allergies or adverse reactions: No ?Accompanied By: daughter ?Had a fall or experienced change in No ?Transfer Assistance: None ?activities of daily living that may affect ?Patient Identification Verified: Yes ?risk of falls: ?Secondary Verification Process Completed: Yes ?Signs or symptoms of abuse/neglect since last visito No ?Patient Requires Transmission-Based Precautions: No ?Hospitalized since last visit: No ?Patient Has Alerts: Yes ?Implantable device outside of the clinic excluding No ?Patient Alerts: [id number] to obtain;pain cellular tissue based products placed in the center ?since last visit: ?Has Dressing in Place as Prescribed: Yes ?Has Compression in Place as Prescribed: No ?Pain Present Now: No ?Notes ?per daughter has the [place] topical antibiotics at home, has not opened the box, nor brought in the office today for application. Kurt Hoffmeier made ?aware. ?Electronic Signature(s) ?Signed: 10/14/2021 4:59:59 PM By: Deon Pilling RN, BSN ?Entered By: Deon Pilling on 10/14/2021 08:25:27 ?-------------------------------------------------------------------------------- ?Clinic Level of Care Assessment Details ?Patient Name: Date of Service: ?[name], Jonathon W. 10/14/2021 8:15 A M ?Medical Record Number: [id number] ?Patient Account Number: [id number] ?Date of  Birth/Sex: Treating RN: ?12/05/1933 (88 y.o. M) [name], [name] ?Primary Care [name]: [name] Other Clinician: ?Referring Milbert Bixler: ?Treating Sandar Krinke/Extender: Kalman Shan ?[name] ?Weeks in Treatment: 2 ?Clinic Level of Care Assessment Items ?TOOL 4 Quantity Score ?X- 1 0 ?Use when only an EandM is performed on FOLLOW-UP visit ?ASSESSMENTS - Nursing Assessment / Reassessment ?X- 1 10 ?Reassessment of Co-morbidities (includes updates in patient status) ?X- 1 5 ?Reassessment of Adherence to Treatment Plan ?ASSESSMENTS - Wound and Skin A ssessment / Reassessment ?X - Simple Wound Assessment / Reassessment - one wound 1 5 ?'[]'$  - 0 ?Complex Wound Assessment / Reassessment - multiple wounds ?X- 1 10 ?Dermatologic / Skin Assessment (not related to wound area) ?ASSESSMENTS - Focused Assessment ?X- 1 5 ?Circumferential Edema Measurements - multi extremities ?'[]'$  - 0 ?Nutritional Assessment / Counseling / Intervention ?'[]'$  - 0 ?Lower Extremity Assessment (monofilament, tuning fork, pulses) ?'[]'$  - 0 ?Peripheral Arterial Disease Assessment (using hand held doppler) ?ASSESSMENTS - Ostomy and/or Continence Assessment and Care ?'[]'$  - 0 ?Incontinence Assessment and Management ?'[]'$  - 0 ?Ostomy Care Assessment and Management (repouching, etc.) ?PROCESS - Coordination of Care ?X - Simple Patient / Family Education for ongoing care 1 15 ?'[]'$  - 0 ?Complex (extensive) Patient / Family Education for ongoing care ?X- 1 10 ?Staff obtains Consents, Records, T Results / Process Orders ?est ?'[]'$  - 0 ?Staff telephones HHA, Nursing Homes / Clarify orders / etc ?'[]'$  - 0 ?Routine Transfer to another Facility (non-emergent condition) ?'[]'$  - 0 ?Routine Hospital Admission (non-emergent condition) ?'[]'$  - 0 ?New Admissions / Biomedical engineer / Ordering NPWT Apligraf, etc. ?, ?'[]'$  - 0 ?Emergency Hospital Admission (emergent condition) ?X- 1 10 ?Simple Discharge Coordination ?'[]'$  - 0 ?Complex (extensive) Discharge Coordination ?PROCESS -  Special Needs ?'[]'$  - 0 ?Pediatric / Minor Patient Management ?'[]'$  - 0 ?Isolation Patient Management ?'[]'$  - 0 ?Hearing / Language / Visual special needs ?'[]'$  - 0 ?Assessment of Community assistance (  transportation, D/C planning, etc.) ?'[]'$  - 0 ?Additional assistance / Altered mentation ?'[]'$  - 0 ?Support Surface(s) Assessment (bed, cushion, seat, etc.) ?INTERVENTIONS - Wound Cleansing / Measurement ?X - Simple Wound Cleansing - one wound 1 5 ?'[]'$  - 0 ?Complex Wound Cleansing - multiple wounds ?X- 1 5 ?Wound Imaging (photographs - any number of wounds) ?'[]'$  - 0 ?Wound Tracing (instead of photographs) ?X- 1 5 ?Simple Wound Measurement - one wound ?'[]'$  - 0 ?Complex Wound Measurement - multiple wounds ?INTERVENTIONS - Wound Dressings ?X - Small Wound Dressing one or multiple wounds 1 10 ?'[]'$  - 0 ?Medium Wound Dressing one or multiple wounds ?'[]'$  - 0 ?Large Wound Dressing one or multiple wounds ?X- 1 5 ?Application of Medications - topical ?'[]'$  - 0 ?Application of Medications - injection ?INTERVENTIONS - Miscellaneous ?'[]'$  - 0 ?External ear exam ?'[]'$  - 0 ?Specimen Collection (cultures, biopsies, blood, body fluids, etc.) ?'[]'$  - 0 ?Specimen(s) / Culture(s) sent or taken to Lab for analysis ?'[]'$  - 0 ?Patient Transfer (multiple staff / Civil Service fast streamer / Similar devices) ?'[]'$  - 0 ?Simple Staple / Suture removal (25 or less) ?'[]'$  - 0 ?Complex Staple / Suture removal (26 or more) ?'[]'$  - 0 ?Hypo / Hyperglycemic Management (close monitor of Blood Glucose) ?'[]'$  - 0 ?Ankle / Brachial Index (ABI) - do not check if billed separately ?X- 1 5 ?Vital Signs ?Has the patient been seen at the hospital within the last three years: Yes ?Total Score: 105 ?Level Of Care: New/Established - Level 3 ?Electronic Signature(s) ?Signed: 10/14/2021 4:59:59 PM By: Deon Pilling RN, BSN ?Entered By: Deon Pilling on 10/14/2021 09:33:58 ?-------------------------------------------------------------------------------- ?Encounter Discharge Information Details ?Patient Name: Date of  Service: ?[name], Jonathon W. 10/14/2021 8:15 A M ?Medical Record Number: [id number] ?Patient Account Number: [id number] ?Date of Birth/Sex: Treating RN: ?06-14-1933 (88 y.o. M) [name], [name] ?Primary Care Cardarius Senat: [name] Other Clinician: ?Referring Hollin Crewe: ?Treating Caleigh Rabelo/Extender: Kalman Shan ?[name] ?Weeks in Treatment: 2 ?Encounter Discharge Information Items ?Discharge Condition: Stable ?Ambulatory Status: Ambulatory ?Discharge Destination: Home ?Transportation: Private Auto ?Accompanied By: daughter ?Schedule Follow-up Appointment: Yes ?Clinical Summary of Care: ?Electronic Signature(s) ?Signed: 10/14/2021 4:59:59 PM By: Deon Pilling RN, BSN ?Entered By: Deon Pilling on 10/14/2021 08:54:23 ?-------------------------------------------------------------------------------- ?Lower Extremity Assessment Details ?Patient Name: Date of Service: ?[name], Jonathon W. 10/14/2021 8:15 A M ?Medical Record Number: [id number] ?Patient Account Number: [id number] ?Date of Birth/Sex: Treating RN: ?1933-10-30 (88 y.o. M) [name], [name] ?Primary Care Yanelie Abraha: [name] Other Clinician: ?Referring Ronin Rehfeldt: ?Treating Darivs Lunden/Extender: Kalman Shan ?[name] ?Weeks in Treatment: 2 ?Edema Assessment ?Assessed: [Left: No] [Right: Yes] ?Edema: [Left: Ye] [Right: s] ?Calf ?Left: Right: ?Point of Measurement: 31 cm From Medial Instep 33 cm ?Ankle ?Left: Right: ?Point of Measurement: 7 cm From Medial Instep 26 cm ?Vascular Assessment ?Pulses: ?Dorsalis Pedis ?Palpable: [Right:Yes] ?Electronic Signature(s) ?Signed: 10/14/2021 4:59:59 PM By: Deon Pilling RN, BSN ?Entered By: Deon Pilling on 10/14/2021 08:29:09 ?-------------------------------------------------------------------------------- ?Multi Wound Chart Details ?Patient Name: ?Date of Service: ?[name], Jonathon W. 10/14/2021 8:15 A M ?Medical Record Number: [id number] ?Patient Account Number: [id number] ?Date of Birth/Sex: ?Treating RN: ?[birth year] (88 y.o. M)  [name], [name] ?Primary Care Lyzbeth Genrich: [name] ?Other Clinician: ?Referring Ilisa Hayworth: ?Treating Mekiyah Gladwell/Extender: Kalman Shan ?[name] ?Weeks in Treatment: 2 ?Vital Signs ?Height(in): 69 ?Pulse

## 2021-10-16 ENCOUNTER — Other Ambulatory Visit: Payer: Self-pay | Admitting: Internal Medicine

## 2021-10-16 DIAGNOSIS — E063 Autoimmune thyroiditis: Secondary | ICD-10-CM

## 2021-10-17 ENCOUNTER — Other Ambulatory Visit: Payer: Self-pay | Admitting: Adult Health

## 2021-10-21 ENCOUNTER — Other Ambulatory Visit: Payer: Self-pay | Admitting: Internal Medicine

## 2021-10-21 ENCOUNTER — Encounter (HOSPITAL_BASED_OUTPATIENT_CLINIC_OR_DEPARTMENT_OTHER): Payer: Medicare Other | Admitting: Internal Medicine

## 2021-10-21 ENCOUNTER — Telehealth: Payer: Self-pay | Admitting: Cardiology

## 2021-10-21 DIAGNOSIS — S8011XA Contusion of right lower leg, initial encounter: Secondary | ICD-10-CM | POA: Diagnosis not present

## 2021-10-21 DIAGNOSIS — I110 Hypertensive heart disease with heart failure: Secondary | ICD-10-CM | POA: Diagnosis not present

## 2021-10-21 DIAGNOSIS — I87311 Chronic venous hypertension (idiopathic) with ulcer of right lower extremity: Secondary | ICD-10-CM

## 2021-10-21 DIAGNOSIS — I2510 Atherosclerotic heart disease of native coronary artery without angina pectoris: Secondary | ICD-10-CM | POA: Diagnosis not present

## 2021-10-21 DIAGNOSIS — I739 Peripheral vascular disease, unspecified: Secondary | ICD-10-CM

## 2021-10-21 DIAGNOSIS — E11622 Type 2 diabetes mellitus with other skin ulcer: Secondary | ICD-10-CM | POA: Diagnosis not present

## 2021-10-21 DIAGNOSIS — I509 Heart failure, unspecified: Secondary | ICD-10-CM | POA: Diagnosis not present

## 2021-10-21 DIAGNOSIS — I255 Ischemic cardiomyopathy: Secondary | ICD-10-CM | POA: Diagnosis not present

## 2021-10-21 DIAGNOSIS — S8011XD Contusion of right lower leg, subsequent encounter: Secondary | ICD-10-CM | POA: Diagnosis not present

## 2021-10-21 DIAGNOSIS — L97818 Non-pressure chronic ulcer of other part of right lower leg with other specified severity: Secondary | ICD-10-CM

## 2021-10-21 DIAGNOSIS — I70238 Atherosclerosis of native arteries of right leg with ulceration of other part of lower right leg: Secondary | ICD-10-CM

## 2021-10-21 DIAGNOSIS — I6523 Occlusion and stenosis of bilateral carotid arteries: Secondary | ICD-10-CM

## 2021-10-21 DIAGNOSIS — Z950 Presence of cardiac pacemaker: Secondary | ICD-10-CM | POA: Diagnosis not present

## 2021-10-21 DIAGNOSIS — Z7902 Long term (current) use of antithrombotics/antiplatelets: Secondary | ICD-10-CM | POA: Diagnosis not present

## 2021-10-21 DIAGNOSIS — L97812 Non-pressure chronic ulcer of other part of right lower leg with fat layer exposed: Secondary | ICD-10-CM | POA: Diagnosis not present

## 2021-10-21 NOTE — Progress Notes (Signed)
[name], [name] ([id number]) Visit Report for 10/21/2021 Arrival Information Details Patient Name: Date of Service: [name], [name] 10/21/2021 3:00 PM Medical Record Number: [id number] Patient Account Number: [id number] Date of Birth/Sex: Treating RN: March 02, 1934 (88 y.o. [name] Primary Care Orvin Netter: [name] Other Clinician: Referring Lili Harts: Treating Ramisa Duman/Extender: [name] in Treatment: 3 Visit Information History Since Last Visit Added or deleted any medications: No Patient Arrived: Ambulatory Any new allergies or adverse reactions: No Arrival Time: 14:58 Had a fall or experienced change in No Accompanied By: daughter activities of daily living that may affect Transfer Assistance: None risk of falls: Patient Identification Verified: Yes Signs or symptoms of abuse/neglect since No Secondary Verification Process Completed: Yes last visito Patient Requires Transmission-Based Precautions: No Hospitalized since last visit: No Patient Has Alerts: Yes Implantable device outside of the clinic No Patient Alerts: [id number] to obtain;pain excluding cellular tissue based products placed in the center since last visit: Has Dressing in Place as Prescribed: Yes Has Footwear/Offloading in Place as Yes Prescribed: Right: Surgical Shoe with Pressure Relief Insole Pain Present Now: No Electronic Signature(s) Signed: 10/21/2021 4:09:03 PM By: Lorrin Jackson Entered By: Lorrin Jackson on 10/21/2021 15:02:04 -------------------------------------------------------------------------------- Clinic Level of Care Assessment Details Patient Name: Date of Service: [name], [name] 10/21/2021 3:00 PM Medical Record Number: [id number] Patient Account Number: [id number] Date of Birth/Sex: Treating RN: Jan 17, 1934 (88 y.o. [name] Primary Care Stephan Draughn: [name] Other Clinician: Referring Azalynn Maxim: Treating Taj Arteaga/Extender: [name] in Treatment: 3 Clinic Level of Care Assessment Items TOOL 4 Quantity Score X- 1 0 Use when only an EandM is performed on FOLLOW-UP visit ASSESSMENTS - Nursing Assessment / Reassessment X- 1 10 Reassessment of Co-morbidities (includes updates in patient status) X- 1 5 Reassessment of Adherence to Treatment Plan ASSESSMENTS - Wound and Skin A ssessment / Reassessment X - Simple Wound Assessment / Reassessment - one wound 1 5 '[]'$  - 0 Complex Wound Assessment / Reassessment - multiple wounds '[]'$  - 0 Dermatologic / Skin Assessment (not related to wound area) ASSESSMENTS - Focused Assessment '[]'$  - 0 Circumferential Edema Measurements - multi extremities '[]'$  - 0 Nutritional Assessment / Counseling / Intervention '[]'$  - 0 Lower Extremity Assessment (monofilament, tuning fork, pulses) '[]'$  - 0 Peripheral Arterial Disease Assessment (using hand held doppler) ASSESSMENTS - Ostomy and/or Continence Assessment and Care '[]'$  - 0 Incontinence Assessment and Management '[]'$  - 0 Ostomy Care Assessment and Management (repouching, etc.) PROCESS - Coordination of Care '[]'$  - 0 Simple Patient / Family Education for ongoing care X- 1 20 Complex (extensive) Patient / Family Education for ongoing care X- 1 10 Staff obtains Programmer, systems, Records, T Results / Process Orders est '[]'$  - 0 Staff telephones HHA, Nursing Homes / Clarify orders / etc '[]'$  - 0 Routine Transfer to another Facility (non-emergent condition) '[]'$  - 0 Routine Hospital Admission (non-emergent condition) '[]'$  - 0 New Admissions / Biomedical engineer / Ordering NPWT Apligraf, etc. , '[]'$  - 0 Emergency Hospital Admission (emergent condition) '[]'$  - 0 Simple Discharge Coordination '[]'$  - 0 Complex (extensive) Discharge Coordination PROCESS - Special Needs '[]'$  - 0 Pediatric / Minor Patient Management '[]'$  - 0 Isolation Patient Management '[]'$  - 0 Hearing / Language / Visual special needs '[]'$  - 0 Assessment of  Community assistance (transportation, D/C planning, etc.) '[]'$  - 0 Additional assistance / Altered mentation '[]'$  - 0 Support Surface(s) Assessment (bed, cushion, seat, etc.) INTERVENTIONS - Wound Cleansing / Measurement X - Simple Wound Cleansing -  one wound 1 5 '[]'$  - 0 Complex Wound Cleansing - multiple wounds X- 1 5 Wound Imaging (photographs - any number of wounds) '[]'$  - 0 Wound Tracing (instead of photographs) X- 1 5 Simple Wound Measurement - one wound '[]'$  - 0 Complex Wound Measurement - multiple wounds INTERVENTIONS - Wound Dressings '[]'$  - 0 Small Wound Dressing one or multiple wounds X- 1 15 Medium Wound Dressing one or multiple wounds '[]'$  - 0 Large Wound Dressing one or multiple wounds '[]'$  - 0 Application of Medications - topical '[]'$  - 0 Application of Medications - injection INTERVENTIONS - Miscellaneous '[]'$  - 0 External ear exam '[]'$  - 0 Specimen Collection (cultures, biopsies, blood, body fluids, etc.) '[]'$  - 0 Specimen(s) / Culture(s) sent or taken to Lab for analysis '[]'$  - 0 Patient Transfer (multiple staff / Civil Service fast streamer / Similar devices) '[]'$  - 0 Simple Staple / Suture removal (25 or less) '[]'$  - 0 Complex Staple / Suture removal (26 or more) '[]'$  - 0 Hypo / Hyperglycemic Management (close monitor of Blood Glucose) '[]'$  - 0 Ankle / Brachial Index (ABI) - do not check if billed separately X- 1 5 Vital Signs Has the patient been seen at the hospital within the last three years: Yes Total Score: 85 Level Of Care: New/Established - Level 3 Electronic Signature(s) Signed: 10/21/2021 4:09:03 PM By: Lorrin Jackson Entered By: Lorrin Jackson on 10/21/2021 15:29:56 -------------------------------------------------------------------------------- Encounter Discharge Information Details Patient Name: Date of Service: Jonathon Mallet. 10/21/2021 3:00 PM Medical Record Number: [id number] Patient Account Number: [id number] Date of Birth/Sex: Treating RN: 1934/04/14 (88 y.o. [name] Primary Care Trenell Moxey: [name] Other Clinician: Referring Shelly Spenser: Treating Jadden Yim/Extender: [name] in Treatment: 3 Encounter Discharge Information Items Discharge Condition: Stable Ambulatory Status: Ambulatory Discharge Destination: Home Transportation: Private Auto Accompanied By: daughter Schedule Follow-up Appointment: Yes Clinical Summary of Care: Provided on 10/21/2021 Form Type Recipient Paper Patient Patient Electronic Signature(s) Signed: 10/21/2021 4:09:03 PM By: Lorrin Jackson Entered By: Lorrin Jackson on 10/21/2021 15:30:52 -------------------------------------------------------------------------------- Lower Extremity Assessment Details Patient Name: Date of Service: [name], [place]. 10/21/2021 3:00 PM Medical Record Number: [id number] Patient Account Number: [id number] Date of Birth/Sex: Treating RN: December 04, 1933 (88 y.o. [name] Primary Care Selden Noteboom: [name] Other Clinician: Referring Ayan Heffington: Treating Jenson Beedle/Extender: [name] in Treatment: 3 Edema Assessment Assessed: [name]: Yes] [name]: Yes] Edema: [Left: Ye] [Right: s] Calf Left: Right: Point of Measurement: 31 cm From Medial Instep 35 cm Ankle Left: Right: Point of Measurement: 7 cm From Medial Instep 27.5 cm Vascular Assessment Pulses: Dorsalis Pedis Palpable: [Right:Yes] Electronic Signature(s) Signed: 10/21/2021 4:09:03 PM By: Lorrin Jackson Entered By: Lorrin Jackson on 10/21/2021 15:08:44 -------------------------------------------------------------------------------- Multi Wound Chart Details Patient Name: Date of Service: Jonathon Mallet. 10/21/2021 3:00 PM Medical Record Number: [id number] Patient Account Number: [id number] Date of Birth/Sex: Treating RN: 05/01/1934 (88 y.o. 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 Secondary Etiology: Extremity Cataracts, Anemia, Asthma, Chronic N/A N/A Comorbid History: Obstructive Pulmonary Disease (COPD), Congestive Heart Failure, Coronary Artery Disease, Hypertension, Myocardial Infarction, Peripheral Arterial Disease, Type II Diabetes 07/02/2021 N/A N/A Date Acquired: 3  N/A N/A Weeks of Treatment: Open N/A N/A Wound Status: No N/A N/A Wound Recurrence: 3.2x3x0.3 N/A N/A Measurements L x W x D (cm) 7.54 N/A N/A A (cm) : rea 2.262 N/A N/A Volume (cm) : -109.20% N/A N/A % Reduction in Area: -56.90% N/A N/A % Reduction in Volume: Full Thickness Without Exposed N/A N/A Classification: Support Structures Medium N/A N/A Exudate Amount: Serosanguineous N/A N/A Exudate Type: red, brown N/A N/A Exudate Color: Epibole N/A N/A Wound Margin: Large (67-100%) N/A N/A Granulation Amount: Red, Pink N/A N/A Granulation Quality: Small (1-33%) N/A N/A Necrotic Amount: Fat Layer (Subcutaneous Tissue): Yes N/A N/A Exposed Structures: Fascia: No Tendon: No Muscle: No Joint: No Bone: No Small (1-33%) N/A N/A Epithelialization: Treatment Notes Wound #1 (Lower Leg) Wound Laterality: Right, Anterior Cleanser Soap and Water Discharge Instruction: May shower and wash wound with dial antibacterial soap and water prior to dressing change. Wound Cleanser Discharge Instruction: Cleanse the wound with wound cleanser prior to applying a clean dressing using gauze sponges, not tissue or cotton balls. Peri-Wound Care Skin  Prep Discharge Instruction: Use skin prep as directed Sween Lotion (Moisturizing lotion) Discharge Instruction: Apply moisturizing lotion as directed Topical Keystone compounding topical antibiotics Discharge Instruction: apply daily directly to wound bed. Primary Dressing Hydrofera Blue Ready Foam, 4x5 in Discharge Instruction: Apply to wound bed as instructed Secondary Dressing Zetuvit Plus Silicone Border Dressing 4x4 (in/in) Discharge Instruction: Apply silicone border over primary dressing as directed. Secured With Compression Wrap Compression Stockings Environmental education officer) Signed: 10/21/2021 4:02:54 PM By: Kalman Shan DO Signed: 10/21/2021 4:34:51 PM By: Deon Pilling RN, BSN Entered By: Kalman Shan on 10/21/2021 15:57:28 -------------------------------------------------------------------------------- Multi-Disciplinary Care Plan Details Patient Name: Date of Service: Jonathon Mallet. 10/21/2021 3:00 PM Medical Record Number: [id number] Patient Account Number: [id number] Date of Birth/Sex: Treating RN: August 22, 1933 (88 y.o. [name] Primary Care Jamason Peckham: [name] Other Clinician: Referring Liel Rudden: Treating Elmond Poehlman/Extender: [name] in Treatment: 3 Active Inactive Tissue Oxygenation Nursing Diagnoses: Potential alteration in peripheral tissue perfusion (select prior to confirmation of diagnosis) Goals: Non-invasive arterial studies are completed as ordered Date Initiated: 10/14/2021 Target Resolution Date: 11/18/2021 Goal Status: Active Interventions: Assess patient understanding of disease process and management upon diagnosis and as needed Assess peripheral arterial status upon admission and as needed Provide education on tissue oxygenation and ischemia Treatment Activities: Ankle Brachial Index (ABI) : 09/30/2021 Non-invasive vascular studies : 10/14/2021 Notes: Wound/Skin Impairment Nursing  Diagnoses: Knowledge deficit related to ulceration/compromised skin integrity Goals: Patient/caregiver will verbalize understanding of skin care regimen Date Initiated: 09/26/2021 Target Resolution Date: 11/18/2021 Goal Status: Active Interventions: Assess patient/caregiver ability to perform ulcer/skin care regimen upon admission and as needed Assess ulceration(s) every visit Provide education on ulcer and skin care Treatment Activities: Skin care regimen initiated : 09/26/2021 Topical wound management initiated : 09/26/2021 Notes: 10/21/21: Wound care regimen continues, using [employer] and [employer]. Electronic Signature(s) Signed: 10/21/2021 4:09:03 PM By: Lorrin Jackson Entered By: Lorrin Jackson on 10/21/2021 15:21:17 -------------------------------------------------------------------------------- Pain Assessment Details Patient Name: Date of Service: [name], [name] 10/21/2021 3:00 PM Medical Record Number: [id number] Patient Account Number: [id number] Date of Birth/Sex: Treating RN: Mar 20, 1934 (88 y.o. [name] Primary Care Aleene Swanner: [name] Other Clinician: Referring Thunder [place]: Treating Taylr Meuth/Extender: [name] in Treatment: 3 Active Problems Location of Pain Severity and Description of Pain Patient Has Paino No Site Locations Pain Management and Medication Current Pain Management: Electronic Signature(s) Signed: 10/21/2021 4:09:03 PM By: Fara Chute  By: Lorrin Jackson on 10/21/2021 15:03:51 -------------------------------------------------------------------------------- Patient/Caregiver Education Details Patient Name: Date of Service: [name], Jonathon W. 5/23/2023andnbsp3:00 PM Medical Record Number: [id number] Patient Account Number: [id number] Date of Birth/Gender: Treating RN: [birth year] (88 y.o. [name] Primary Care Physician: [name] Other Clinician: Referring Physician: Treating  Physician/Extender: [name] in Treatment: 3 Education Assessment Education Provided To: Patient and Caregiver Education Topics Provided Infection: Methods: Explain/Verbal, Printed Responses: State content correctly Wound/Skin Impairment: Methods: Explain/Verbal, Printed Responses: State content correctly Electronic Signature(s) Signed: 10/21/2021 4:09:03 PM By: Lorrin Jackson Entered By: Lorrin Jackson on 10/21/2021 15:21:37 -------------------------------------------------------------------------------- Wound Assessment Details Patient Name: Date of Service: Jonathon Mallet. 10/21/2021 3:00 PM Medical Record Number: [id number] Patient Account Number: [id number] Date of Birth/Sex: Treating RN: 08-19-1933 (88 y.o. [name] Primary Care Michael Ventresca: [name] Other Clinician: Referring Deshawnda Acrey: Treating Giovannina Mun/Extender: [name] in Treatment: 3 Wound Status Wound Number: 1 Primary Abrasion Etiology: Wound Location: Right, Anterior Lower Leg Secondary Diabetic Wound/Ulcer of the Lower Extremity Wounding Event: Trauma Etiology: Date Acquired: 07/02/2021 Wound Open Weeks Of Treatment: 3 Status: Clustered Wound: No Comorbid Cataracts, Anemia, Asthma, Chronic Obstructive Pulmonary History: Disease (COPD), Congestive Heart Failure, Coronary Artery Disease, Hypertension, Myocardial Infarction, Peripheral Arterial Disease, Type II Diabetes Photos Wound Measurements Length: (cm) 3.2 Width: (cm) 3 Depth: (cm) 0.3 Area: (cm) 7.54 Volume: (cm) 2.262 % Reduction in Area: -109.2% % Reduction in Volume: -56.9% Epithelialization: Small (1-33%) Tunneling: No Undermining: No Wound Description Classification: Full Thickness Without Exposed Support Structures Wound Margin: Epibole Exudate Amount: Medium Exudate Type: Serosanguineous Exudate Color: red, brown Foul Odor After Cleansing: No Slough/Fibrino  Yes Wound Bed Granulation Amount: Large (67-100%) Exposed Structure Granulation Quality: Red, Pink Fascia Exposed: No Necrotic Amount: Small (1-33%) Fat Layer (Subcutaneous Tissue) Exposed: Yes Necrotic Quality: Adherent Slough Tendon Exposed: No Muscle Exposed: No Joint Exposed: No Bone Exposed: No Treatment Notes Wound #1 (Lower Leg) Wound Laterality: Right, Anterior Cleanser Soap and Water Discharge Instruction: May shower and wash wound with dial antibacterial soap and water prior to dressing change. Wound Cleanser Discharge Instruction: Cleanse the wound with wound cleanser prior to applying a clean dressing using gauze sponges, not tissue or cotton balls. Peri-Wound Care Skin Prep Discharge Instruction: Use skin prep as directed Sween Lotion (Moisturizing lotion) Discharge Instruction: Apply moisturizing lotion as directed Topical [employer] compounding topical antibiotics Discharge Instruction: apply daily directly to wound bed. Primary Dressing Hydrofera Blue Ready Foam, 4x5 in Discharge Instruction: Apply to wound bed as instructed Secondary Dressing Zetuvit Plus Silicone Border Dressing 4x4 (in/in) Discharge Instruction: Apply silicone border over primary dressing as directed. Secured With Compression Wrap Compression Stockings Environmental education officer) Signed: 10/21/2021 4:09:03 PM By: Lorrin Jackson Entered By: Lorrin Jackson on 10/21/2021 15:07:50 -------------------------------------------------------------------------------- Vitals Details Patient Name: Date of Service: Jonathon Mallet. 10/21/2021 3:00 PM Medical Record Number: [id number] Patient Account Number: [id number] Date of Birth/Sex: Treating RN: Dec 23, 1933 (88 y.o. [name] Primary Care Ricki Vanhandel: [name] Other Clinician: Referring Kimila Papaleo: Treating Illiana Losurdo/Extender: [name] in Treatment: 3 Vital Signs Time Taken: 05:02 Temperature (F):  98 Height (in): 69 Pulse (bpm): 68 Weight (lbs): 168 Respiratory Rate (breaths/min): 16 Body Mass Index (BMI): 24.8 Blood Pressure (mmHg): 157/73 Reference Range: 80 - 120 mg / dl Electronic Signature(s) Signed: 10/21/2021 4:09:03 PM By: Lorrin Jackson Entered By: Lorrin Jackson on 10/21/2021 15:03:45

## 2021-10-21 NOTE — Telephone Encounter (Signed)
[name] called and stated that pt has poor arterial blood flow and needs to be seen soon. Pt has scheduled appt for 11/03/21. She also stated that she would like a callback. Please advise

## 2021-10-21 NOTE — Progress Notes (Signed)
[name], [name] ([id number]) Visit Report for 10/21/2021 Chief Complaint Document Details Patient Name: Date of Service: [name], [name] 10/21/2021 3:00 PM Medical Record Number: [id number] Patient Account Number: [id number] Date of Birth/Sex: Treating RN: 1933/10/07 (88 y.o. [name] Primary Care Provider: [name] Other Clinician: Referring Provider: Treating Provider/Extender: [name] in Treatment: 3 Information Obtained from: Patient Chief Complaint 09/26/2021; patient is here for a review of a complicated wound on the right anterior lower leg which was initially secondary to trauma Electronic Signature(s) Signed: 10/21/2021 4:02:54 PM By: Kalman Shan DO Entered By: Kalman Shan on 10/21/2021 15:57:35 -------------------------------------------------------------------------------- HPI Details Patient Name: Date of Service: [name]. 10/21/2021 3:00 PM Medical Record Number: [id number] Patient Account Number: [id number] Date of Birth/Sex: Treating RN: February 01, 1934 (88 y.o. [name] Primary Care Provider: [name] Other Clinician: Referring Provider: Treating Provider/Extender: [name] in Treatment: 3 History of Present Illness HPI Description: ADMISSION 09/26/21 This is a 88 year old man who lives near [place]. He is accompanied by his daughter who lives with him. His wife is also at home. Apparently 2 months ago he traumatized the right anterior lower leg x2 in quick succession including once on the back of a pickup truck or car. By description it sounds as though he had a black eschar on this which fell off about a month ago. He has been left with an open wound. It this is not that large but is completely slough covered and has undermining of at least a centimeter and half from 12-6 o'clock. He received a course of antibiotics earlier this month from his primary care doctor Ceftin.  He is not on antibiotics currently. The patient has a complicated relevant history. 2019 he underwent bilateral common iliac artery stenting and angioplasties by Dr. Carlis Abbott. He had ABIs at the same time which showed a ABI at 0.55 on the right and a TBI of 0.38. He had a TBI of 0.43 on the left they could not do an ABI because he could not tolerate the pressure cuff. He has not followed up. The patient is apparently a diabetic although he has not been on any treatment in years and its not clear that he is even followed up. Other past medical history includes coronary artery disease, ischemic cardiomyopathy status post pacemaker, he is on Plavix, He did not cooperate with our attempt to do ABIs on the right leg in our clinic 5/5; patient presents for follow-up. Last week he was started on Iodosorb under Kerlix/Coban. He has no issues or complaints today. He has his venous and arterial studies scheduled for next week. He currently denies signs of infection. 5/16; patient presents for follow-up. Last week he was started on Hydrofera Blue and gentamicin and mupirocin ointment under Kerlix/Coban. He tolerated this well. He had a PCR culture that reported high levels of Streptococcus agalactiae, Pseudomonas aeruginosa and Enterococcus bacillus. Keystone antibiotics were ordered and he has this however did not bring it today. He had ABIs with TBI's and venous reflux studies. He had evidence of reflux throughout the right and left venous system. His ABIs on the right was noncompressible with a TBI of 0.19. He currently denies systemic signs of infection. 5/23; patient presents for follow-up. He has been using Keystone antibiotic to the wound bed without issues. He currently denies signs of infection. He has not heard back from Dr. Kennon Holter office. Electronic Signature(s) Signed: 10/21/2021 4:02:54 PM By: Kalman Shan DO Entered By: Kalman Shan  on 10/21/2021  15:59:02 -------------------------------------------------------------------------------- Physical Exam Details Patient Name: Date of Service: [name], [name] 10/21/2021 3:00 PM Medical Record Number: [id number] Patient Account Number: [id number] Date of Birth/Sex: Treating RN: 1933-06-16 (88 y.o. [name] Primary Care Provider: [name] Other Clinician: Referring Provider: Treating Provider/Extender: [name] in Treatment: 3 Constitutional respirations regular, non-labored and within target range for patient.. Cardiovascular 2+ dorsalis pedis/posterior tibialis pulses. Psychiatric pleasant and cooperative. Notes Right lower extremity: T the anterior mid tibia there is an open wound with granulation tissue and scant nonviable tissue. There is undermining from 12-6. No o surrounding soft tissue infection. Electronic Signature(s) Signed: 10/21/2021 4:02:54 PM By: Kalman Shan DO Entered By: Kalman Shan on 10/21/2021 16:00:22 -------------------------------------------------------------------------------- Physician Orders Details Patient Name: Date of Service: [name]. 10/21/2021 3:00 PM Medical Record Number: [id number] Patient Account Number: [id number] Date of Birth/Sex: Treating RN: 09/30/1933 (88 y.o. 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 - Dr. Heber  and [place], Room 8 11/06/2021 3pm Return A Other: - Dr. Gwenlyn Found office will call you to schedule an appointment related to poor arterial and venous studies. Bathing/ Shower/ Hygiene May shower with protection but do not get wound dressing(s) wet. Edema Control - Lymphedema / SCD / Other Elevate legs to the level of the heart or above for 30 minutes daily and/or when sitting, a frequency of: - 3-4 times a day throughout the day. Avoid standing for long periods of time. Exercise regularly Wound Treatment Wound #1 - Lower Leg Wound Laterality: Right, Anterior Cleanser: Soap and Water 1 x Per Day/30 Days Discharge Instructions: May shower and wash wound with dial antibacterial soap and water prior to dressing change. Cleanser: Wound Cleanser (Generic) 1 x Per Day/30 Days Discharge Instructions: Cleanse the wound with wound cleanser prior to applying a clean dressing using gauze sponges, not tissue or cotton balls. Peri-Wound Care: Skin Prep (Generic) 1 x Per Day/30 Days Discharge Instructions: Use skin prep as directed Peri-Wound Care: Sween Lotion (Moisturizing lotion) 1 x Per Day/30 Days Discharge Instructions: Apply moisturizing lotion as directed Topical: Keystone compounding topical antibiotics 1 x Per Day/30 Days Discharge Instructions: apply daily directly to wound bed. Prim Dressing: Hydrofera Blue Ready Foam, 4x5 in 1 x Per Day/30 Days ary Discharge Instructions: Apply to wound bed as instructed Secondary Dressing: Zetuvit Plus Silicone Border Dressing 4x4 (in/in) (Generic) 1 x Per Day/30 Days Discharge Instructions: Apply silicone border over primary dressing as directed. Electronic Signature(s) Signed: 10/21/2021 4:02:54 PM By: Kalman Shan DO Entered By: Kalman Shan on 10/21/2021 16:00:30 -------------------------------------------------------------------------------- Problem List Details Patient Name: Date of Service: [name]. 10/21/2021  3:00 PM Medical Record Number: [id number] Patient Account Number: [id number] Date of Birth/Sex: Treating RN: 03-17-1934 (88 y.o. [name] Primary Care Provider: [name] Other Clinician: Referring Provider: Treating Provider/Extender: [name] in Treatment: 3 Active Problems ICD-10 Encounter Code Description Active Date MDM Diagnosis S80.11XD Contusion of right lower leg, subsequent encounter 09/26/2021 No Yes L97.818 Non-pressure chronic ulcer  of other part of right lower leg with other specified 09/26/2021 No Yes severity I87.311 Chronic venous hypertension (idiopathic) with ulcer of right lower extremity 09/26/2021 No Yes I70.238 Atherosclerosis of native arteries of right leg with ulceration of other part of 10/14/2021 No Yes lower leg Inactive Problems Resolved Problems Electronic Signature(s) Signed: 10/21/2021 4:02:54 PM By: Kalman Shan DO Entered By: Kalman Shan on 10/21/2021 15:54:51 -------------------------------------------------------------------------------- Progress Note Details Patient Name: Date of Service: [name]. 10/21/2021 3:00 PM Medical Record Number: [id number] Patient Account Number: [id number] Date of Birth/Sex: Treating RN: [birth year] (88 y.o. [name] Primary Care Provider: [name] Other Clinician: Referring Provider: Treating Provider/Extender: [name] in Treatment: 3 Subjective Chief Complaint Information obtained from Patient 09/26/2021; patient is here for a review of a complicated wound on the right anterior lower leg which was initially secondary to trauma History of Present Illness (HPI) ADMISSION 09/26/21 This is a 88 year old man who lives near [place]. He is accompanied by his daughter who lives with him. His wife is also at home. Apparently 2 months ago he traumatized the right anterior lower leg x2 in quick succession including once on the  back of a pickup truck or car. By description it sounds as though he had a black eschar on this which fell off about a month ago. He has been left with an open wound. It this is not that large but is completely slough covered and has undermining of at least a centimeter and half from 12-6 o'clock. He received a course of antibiotics earlier this month from his primary care doctor Ceftin. He is not on antibiotics currently. The patient has a complicated relevant history. 2019 he underwent bilateral common iliac artery stenting and angioplasties by Dr. Carlis Abbott. He had ABIs at the same time which showed a ABI at 0.55 on the right and a TBI of 0.38. He had a TBI of 0.43 on the left they could not do an ABI because he could not tolerate the pressure cuff. He has not followed up. The patient is apparently a diabetic although he has not been on any treatment in years and its not clear that he is even followed up. Other past medical history includes coronary artery disease, ischemic cardiomyopathy status post pacemaker, he is on Plavix, He did not cooperate with our attempt to do ABIs on the right leg in our clinic 5/5; patient presents for follow-up. Last week he was started on Iodosorb under Kerlix/Coban. He has no issues or complaints today. He has his venous and arterial studies scheduled for next week. He currently denies signs of infection. 5/16; patient presents for follow-up. Last week he was started on Hydrofera Blue and gentamicin and mupirocin ointment under Kerlix/Coban. He tolerated this well. He had a PCR culture that reported high levels of Streptococcus agalactiae, Pseudomonas aeruginosa and Enterococcus bacillus. Keystone antibiotics were ordered and he has this however did not bring it today. He had ABIs with TBI's and venous reflux studies. He had evidence of reflux throughout the right and left venous system. His ABIs on the right was noncompressible with a TBI of 0.19. He currently denies  systemic signs of infection. 5/23; patient presents for follow-up. He has been using Keystone antibiotic to the wound bed without issues. He currently denies signs of infection. He has not heard back from Dr. Kennon Holter office. Patient History Information obtained from Patient, Chart. Family History Cancer - Siblings, Kidney Disease - Father. Social History Former smoker - quit  1985, Marital Status - Married, Alcohol Use - Never, Drug Use - No History, Caffeine Use - Daily. Medical History Eyes Patient has history of Cataracts - Extraction 2018 Hematologic/Lymphatic Patient has history of Anemia Respiratory Patient has history of Asthma, Chronic Obstructive Pulmonary Disease (COPD) Cardiovascular Patient has history of Congestive Heart Failure, Coronary Artery Disease, Hypertension, Myocardial Infarction - 2009, Peripheral Arterial Disease Endocrine Patient has history of Type II Diabetes - Diet Controlled-does not take any medications Hospitalization/Surgery History - angioplasty stent- 2019 dr.Clark. - carotid stents. Medical A Surgical History Notes nd Eyes thyroid eye disease, hypertrophia of right eye, hypotropia of left eye, diplopia- corrected per daughter Cardiovascular Pacemaker/defibrillator Endocrine Thyroiditis, Hypothyroidism Objective Constitutional respirations regular, non-labored and within target range for patient.. Vitals Time Taken: 5:02 AM, Height: 69 in, Weight: 168 lbs, BMI: 24.8, Temperature: 98 F, Pulse: 68 bpm, Respiratory Rate: 16 breaths/min, Blood Pressure: 157/73 mmHg. Cardiovascular 2+ dorsalis pedis/posterior tibialis pulses. Psychiatric pleasant and cooperative. General Notes: Right lower extremity: T the anterior mid tibia there is an open wound with granulation tissue and scant nonviable tissue. There is undermining o from 12-6. No surrounding soft tissue infection. Integumentary (Hair, Skin) Wound #1 status is Open. Original cause of wound  was Trauma. The date acquired was: 07/02/2021. The wound has been in treatment 3 weeks. The wound is located on the Right,Anterior Lower Leg. The wound measures 3.2cm length x 3cm width x 0.3cm depth; 7.54cm^2 area and 2.262cm^3 volume. There is Fat Layer (Subcutaneous Tissue) exposed. There is no tunneling or undermining noted. There is a medium amount of serosanguineous drainage noted. The wound margin is epibole. There is large (67-100%) red, pink granulation within the wound bed. There is a small (1-33%) amount of necrotic tissue within the wound bed including Adherent Slough. Assessment Active Problems ICD-10 Contusion of right lower leg, subsequent encounter Non-pressure chronic ulcer of other part of right lower leg with other specified severity Chronic venous hypertension (idiopathic) with ulcer of right lower extremity Atherosclerosis of native arteries of right leg with ulceration of other part of lower leg Patient's wound has more granulation tissue present today and appears to be filling in. I recommended continuing Keystone antibiotic. We will follow-up the referral to Dr. Kennon Holter office and give the patient the number as well to call in the next week to further address his blood flow status. Follow-up in 2 weeks. Plan Follow-up Appointments: Return Appointment in 2 weeks. - Dr. Heber [place] and [place], Room 8 11/06/2021 3pm Other: - Dr. Gwenlyn Found office will call you to schedule an appointment related to poor arterial and venous studies. Bathing/ Shower/ Hygiene: May shower with protection but do not get wound dressing(s) wet. Edema Control - Lymphedema / SCD / Other: Elevate legs to the level of the heart or above for 30 minutes daily and/or when sitting, a frequency of: - 3-4 times a day throughout the day. Avoid standing for long periods of time. Exercise regularly WOUND #1: - Lower Leg Wound Laterality: Right, Anterior Cleanser: Soap and Water 1 x Per Day/30 Days Discharge Instructions:  May shower and wash wound with dial antibacterial soap and water prior to dressing change. Cleanser: Wound Cleanser (Generic) 1 x Per Day/30 Days Discharge Instructions: Cleanse the wound with wound cleanser prior to applying a clean dressing using gauze sponges, not tissue or cotton balls. Peri-Wound Care: Skin Prep (Generic) 1 x Per Day/30 Days Discharge Instructions: Use skin prep as directed Peri-Wound Care: Sween Lotion (Moisturizing lotion) 1 x Per Day/30 Days Discharge  Instructions: Apply moisturizing lotion as directed Topical: Keystone compounding topical antibiotics 1 x Per Day/30 Days Discharge Instructions: apply daily directly to wound bed. Prim Dressing: Hydrofera Blue Ready Foam, 4x5 in 1 x Per Day/30 Days ary Discharge Instructions: Apply to wound bed as instructed Secondary Dressing: Zetuvit Plus Silicone Border Dressing 4x4 (in/in) (Generic) 1 x Per Day/30 Days Discharge Instructions: Apply silicone border over primary dressing as directed. 1. Keystone antibiotic daily 2. Follow-up in 2 weeks Electronic Signature(s) Signed: 10/21/2021 4:02:54 PM By: Kalman Shan DO Entered By: Kalman Shan on 10/21/2021 16:02:13 -------------------------------------------------------------------------------- HxROS Details Patient Name: Date of Service: [name]. 10/21/2021 3:00 PM Medical Record Number: [id number] Patient Account Number: [id number] Date of Birth/Sex: Treating RN: June 29, 1933 (88 y.o. [name], Meta.Reding Primary Care Provider: [name] Other Clinician: Referring Provider: Treating Provider/Extender: [name] in Treatment: 3 Information Obtained From Patient Chart Eyes Medical History: Positive for: Cataracts - Extraction 2018 Past Medical History Notes: thyroid eye disease, hypertrophia of right eye, hypotropia of left eye, diplopia- corrected per daughter Hematologic/Lymphatic Medical History: Positive for:  Anemia Respiratory Medical History: Positive for: Asthma; Chronic Obstructive Pulmonary Disease (COPD) Cardiovascular Medical History: Positive for: Congestive Heart Failure; Coronary Artery Disease; Hypertension; Myocardial Infarction - 2009; Peripheral Arterial Disease Past Medical History Notes: Pacemaker/defibrillator Endocrine Medical History: Positive for: Type II Diabetes - Diet Controlled-does not take any medications Past Medical History Notes: Thyroiditis, Hypothyroidism Time with diabetes: 2019 Treated with: Diet Blood sugar tested every day: No HBO Extended History Items Eyes: Cataracts Immunizations Pneumococcal Vaccine: Received Pneumococcal Vaccination: No Implantable Devices Yes Hospitalization / Surgery History Type of Hospitalization/Surgery angioplasty stent- 2019 dr.Clark carotid stents Family and Social History Cancer: Yes - Siblings; Kidney Disease: Yes - Father; Former smoker - quit 1985; Marital Status - Married; Alcohol Use: Never; Drug Use: No History; Caffeine Use: Daily; Financial Concerns: No; Food, Clothing or Shelter Needs: No; Support System Lacking: No; Transportation Concerns: No Electronic Signature(s) Signed: 10/21/2021 4:02:54 PM By: Kalman Shan DO Signed: 10/21/2021 4:34:51 PM By: Deon Pilling RN, BSN Entered By: Kalman Shan on 10/21/2021 15:59:08 -------------------------------------------------------------------------------- Prospect Details Patient Name: Date of Service: [name] 10/21/2021 Medical Record Number: [id number] Patient Account Number: [id number] Date of Birth/Sex: Treating RN: Oct 24, 1933 (88 y.o. [name] Primary Care Provider: [name] Other Clinician: Referring Provider: Treating Provider/Extender: [name] in Treatment: 3 Diagnosis Coding ICD-10 Codes Code Description S80.11XD Contusion of right lower leg, subsequent encounter L97.818 Non-pressure  chronic ulcer of other part of right lower leg with other specified severity I87.311 Chronic venous hypertension (idiopathic) with ulcer of right lower extremity I70.238 Atherosclerosis of native arteries of right leg with ulceration of other part of lower leg Facility Procedures CPT4 Code: [id number] Description: 99213 - WOUND CARE VISIT-LEV 3 EST PT Modifier: Quantity: 1 Physician Procedures : CPT4 Code Description Modifier [id number] - WC PHYS LEVEL 3 - EST PT ICD-10 Diagnosis Description L97.818 Non-pressure chronic ulcer of other part of right lower leg with other specified severity I70.238 Atherosclerosis of native arteries of right  leg with ulceration of other part of lower leg I87.311 Chronic venous hypertension (idiopathic) with ulcer of right lower extremity S80.11XD Contusion of right lower leg, subsequent encounter Quantity: 1 Electronic Signature(s) Signed: 10/21/2021 4:02:54 PM By: Kalman Shan DO Entered By: Kalman Shan on 10/21/2021 16:02:35

## 2021-10-22 NOTE — Telephone Encounter (Signed)
[id number] scheduled per [facility] 10-30-21 at 1130am, need to notify pt

## 2021-10-29 DIAGNOSIS — G4733 Obstructive sleep apnea (adult) (pediatric): Secondary | ICD-10-CM | POA: Diagnosis not present

## 2021-10-30 ENCOUNTER — Ambulatory Visit: Payer: Medicare Other | Admitting: Cardiology

## 2021-11-02 NOTE — Progress Notes (Unsigned)
Cardiology Office Note   Date:  11/03/2021   ID:  [name], DOB 09-11-1933, MRN [id number]  PCP:  Janith Lima, MD  Cardiologist:   [name], MD   Chief Complaint  Patient presents with   Edema      History of Present Illness: [name] is a 88 y.o. male who presents for followup of his coronary disease and cardiomyopathy.   At the last visit his EF was lower than previous at 25% and I started Goltry.  However, he developed significant hypotension and has not tolerated this and has been off that med.  A repeat echo was unchanged at 25%.   He had a CRT D upgrade and was seen by Dr. [name] in October 2021.  I reviewed these records for this visit.    He had normal device function.     Since I last saw him he had increased lower extremity swelling.  He is also had a wound on his right pretibial area that is getting addressed by the wound clinic.  He gets around slowly.  He has some chronic dyspnea but he is not describing PND or orthopnea.  He is not having any new chest pressure, neck or arm discomfort.  His weight is actually down because he is not eating much.   Past Medical History:  Diagnosis Date   Atrial flutter ([place])    (I could not find documentation of this rhythm.)   AUTOMATIC IMPLANTABLE CARDIAC DEFIBRILLATOR SITU    Automatic implantable cardioverter-defibrillator in situ 12/05/2009   Qualifier: Diagnosis of  By: [name], MD, [facility], Binnie Kand    CAD (coronary artery disease) 05/22/2011   Carotid stenosis 05/22/2011   CHF CONGESTIVE HEART FAILURE    45% by echo 2015   COPD 12/01/2007   Qualifier: Diagnosis of  By: [name] NP, [name]     DM2 (diabetes mellitus, type 2) ([place])    DYSLIPIDEMIA    DYSPNEA    Edema    Essential hypertension 11/17/2007   Qualifier: Diagnosis of  By: [name]     Gout    HYPERTENSION    HYPOTHYROIDISM    MYOCARDIAL INFARCTION    MI 1999, stent x 2 to RCA, 70% circ, 30 and 40 % LADs   S/P updgrade to CRT-D  11/23/2020   WEIGHT GAIN, ABNORMAL     Past Surgical History:  Procedure Laterality Date   ANGIOPLASTY     stent   APPENDECTOMY  1957   BIV UPGRADE N/A 11/22/2020   Procedure: BIV ICD UPGRADE;  Surgeon: Evans Lance, MD;  Location: [place] [facility];  Service: Cardiovascular;  Laterality: N/A;   CARDIAC DEFIBRILLATOR PLACEMENT     CAROTID STENT     EYE SURGERY  1985   LOWER EXTREMITY ANGIOGRAPHY N/A 05/04/2018   Procedure: LOWER EXTREMITY ANGIOGRAPHY;  Surgeon: [name], MD;  Location: [place] [facility];  Service: Cardiovascular;  Laterality: N/A;   PERIPHERAL VASCULAR INTERVENTION Bilateral 05/04/2018   Procedure: PERIPHERAL VASCULAR INTERVENTION;  Surgeon: [name], MD;  Location: [facility];  Service: Cardiovascular;  Laterality: Bilateral;  [place]     Current Outpatient Medications  Medication Sig Dispense Refill   ACCU-CHEK AVIVA PLUS test strip      acetaminophen (TYLENOL) 650 MG CR tablet Take 650 mg by mouth every 8 (eight) hours as needed for pain.     aspirin EC 81 MG tablet Take 81 mg by mouth in  the morning. Swallow whole.     atorvastatin (LIPITOR) 40 MG tablet TAKE 1 TABLET (40 MG TOTAL) BY MOUTH IN THE MORNING. 90 tablet 3   carboxymethylcellul-glycerin (REFRESH OPTIVE) 0.5-0.9 % ophthalmic solution Place 2 drops into both eyes daily.     clopidogrel (PLAVIX) 75 MG tablet TAKE 1 TABLET (75 MG TOTAL) BY MOUTH IN THE MORNING. 90 tablet 1   FLOVENT HFA 110 MCG/ACT inhaler Inhale 2 puffs into the lungs 2 (two) times daily.  3   fluticasone (FLONASE) 50 MCG/ACT nasal spray Place 2 sprays into the nose in the morning.     furosemide (LASIX) 20 MG tablet Take 1 tablet (20 mg total) by mouth in the morning. 90 tablet 3   hydrALAZINE (APRESOLINE) 10 MG tablet Take 1 tablet (10 mg total) by mouth in the morning and at bedtime. 180 tablet 3   ipratropium-albuterol (DUONEB) 0.5-2.5 (3) MG/3ML SOLN Take 3 mLs by nebulization every 6 (six) hours as  needed (wheezing/shortness of breath).     metoprolol (TOPROL-XL) 200 MG 24 hr tablet Take 0.5 tablets (100 mg total) by mouth in the morning and at bedtime. 90 tablet 3   mirtazapine (REMERON) 30 MG tablet TAKE 1 TABLET BY MOUTH EVERY DAY 90 tablet 1   montelukast (SINGULAIR) 10 MG tablet Take 1 tablet (10 mg total) by mouth daily. 90 tablet 1   mupirocin ointment (BACTROBAN) 2 % Apply topically daily.     pantoprazole (PROTONIX) 40 MG tablet Take 40 mg by mouth 2 (two) times daily. Morning & Evening     Polyethyl Glycol-Propyl Glycol (SYSTANE OP) Place 1 drop into both eyes in the morning and at bedtime.     SYNTHROID 200 MCG tablet TAKE 1 TABLET (200 MCG TOTAL) BY MOUTH IN THE MORNING. 90 tablet 0   vitamin B-12 (CYANOCOBALAMIN) 1000 MCG tablet Take 1,000 mcg by mouth in the morning.     isosorbide mononitrate (IMDUR) 30 MG 24 hr tablet Take 1 tablet (30 mg total) by mouth daily. 90 tablet 3   No current facility-administered medications for this visit.    Allergies:   Prednisone and Sulfa antibiotics    ROS:  Please see the history of present illness.   Otherwise, review of systems are positive for none.   All other systems are reviewed and negative.    PHYSICAL EXAM: VS:  BP 124/60   Pulse (!) 57   Ht '5\' 8"'$  (1.727 m)   Wt 159 lb (72.1 kg)   SpO2 91%   BMI 24.18 kg/m  , BMI Body mass index is 24.18 kg/m. GENERAL: Somewhat frail appearing NECK:  No jugular venous distention, waveform within normal limits, carotid upstroke brisk and symmetric, no bruits, no thyromegaly LUNGS:  Clear to auscultation bilaterally CHEST: Well-healed device pocket HEART:  PMI not displaced or sustained,S1 and S2 within normal limits, no S3, no S4, no clicks, no rubs, no murmurs ABD:  Flat, positive bowel sounds normal in frequency in pitch, no bruits, no rebound, no guarding, no midline pulsatile mass, no hepatomegaly, no splenomegaly EXT:  2 plus pulses throughout, moderate bilateral right greater  than left leg edema, no cyanosis no clubbing, right leg wound dressed   EKG:  EKG is  ordered today. Sinus rhythm, ventricular pacing, rate 57, axis within normal limits, intervals within normal limits, no acute ST-T wave changes.  Recent Labs: 04/09/2021: Hemoglobin 10.3; Platelets 211.0; TSH 0.81 04/30/2021: ALT 10 09/08/2021: BUN 35; Creatinine, Ser 1.43; Potassium 5.2; Sodium 141  Lipid Panel    Component Value Date/Time   CHOL 112 04/30/2021 1304   TRIG 69 04/30/2021 1304   HDL 41 04/30/2021 1304   CHOLHDL 2.7 04/30/2021 1304   CHOLHDL 4 06/22/2011 0925   VLDL 25.0 06/22/2011 0925   LDLCALC 56 04/30/2021 1304      Wt Readings from Last 3 Encounters:  11/03/21 159 lb (72.1 kg)  09/15/21 164 lb 8 oz (74.6 kg)  08/01/21 158 lb 6.4 oz (71.8 kg)      Other studies Reviewed: Additional studies/ records that were reviewed today include: Labs Review of the above records demonstrates:   See elsewhere   ASSESSMENT AND PLAN:  CAD:  The patient has no new sypmtoms.  No further cardiovascular testing is indicated.  We will continue with aggressive risk reduction and meds as listed.  HTN:    His blood pressure is at target.  No change in therapy.  We cannot titrate his meds because of previous intolerance of ARB's and spironolactone.    DYSLIPIDEMIA:    His LDL was   CARDIOMYOPATHY: As above.  He does have some renal insufficiency thought to be careful about diuresis.  He does not like taking his Lasix and actually will not take it every day.  However, he agrees to try 40 mg for 3 days.  And get a basic metabolic profile in about a week.  We talked about keeping his feet elevated.  He cannot wear compression stockings because of the wound.  CRT D: He is up-to-date with follow-up.  CKD II:   Last creatinine was stable at 1.43.  I will follow this up as mentioned next week.  Current medicines are reviewed at length with the patient today.  The patient does not have concerns  regarding medicines.  The following changes have been made:  None  Labs/ tests ordered today include:     Orders Placed This Encounter  Procedures   Basic metabolic panel   EKG 99-MEQA     Disposition:   FU with APP in six  months.    Signed, [name], MD  11/03/2021 11:02 AM    [place]

## 2021-11-03 ENCOUNTER — Encounter: Payer: Self-pay | Admitting: Cardiology

## 2021-11-03 ENCOUNTER — Ambulatory Visit: Payer: Medicare Other | Admitting: Cardiology

## 2021-11-03 VITALS — BP 124/60 | HR 57 | Ht 68.0 in | Wt 159.0 lb

## 2021-11-03 DIAGNOSIS — I10 Essential (primary) hypertension: Secondary | ICD-10-CM

## 2021-11-03 DIAGNOSIS — I2510 Atherosclerotic heart disease of native coronary artery without angina pectoris: Secondary | ICD-10-CM

## 2021-11-03 DIAGNOSIS — Z79899 Other long term (current) drug therapy: Secondary | ICD-10-CM

## 2021-11-03 DIAGNOSIS — E785 Hyperlipidemia, unspecified: Secondary | ICD-10-CM | POA: Diagnosis not present

## 2021-11-03 NOTE — Patient Instructions (Signed)
Medication Instructions:  INCREASE: Furosemide (lasix) to 40 mg daily for 3 days  *If you need a refill on your cardiac medications before your next appointment, please call your pharmacy*   Lab Work: Your physician recommends that you return for lab work in 1 week (BMP)  If you have labs (blood work) drawn today and your tests are completely normal, you will receive your results only by: [place] (if you have MyChart) OR A paper copy in the mail If you have any lab test that is abnormal or we need to change your treatment, we will call you to review the results.   Testing/Procedures: None ordered today   Follow-Up: At [facility], you and your health needs are our priority.  As part of our continuing mission to provide you with exceptional heart care, we have created designated Provider Care Teams.  These Care Teams include your primary Cardiologist (physician) and Advanced Practice Providers (APPs -  Physician Assistants and Nurse Practitioners) who all work together to provide you with the care you need, when you need it.  We recommend signing up for the patient portal called "MyChart".  Sign up information is provided on this After Visit Summary.  MyChart is used to connect with patients for Virtual Visits (Telemedicine).  Patients are able to view lab/test results, encounter notes, upcoming appointments, etc.  Non-urgent messages can be sent to your provider as well.   To learn more about what you can do with MyChart, go to [email or web address].    Your next appointment:   6 month(s)  The format for your next appointment:   In Person  Provider:   Almyra Deforest, PA-C    {

## 2021-11-06 ENCOUNTER — Encounter (HOSPITAL_BASED_OUTPATIENT_CLINIC_OR_DEPARTMENT_OTHER): Payer: Medicare Other | Attending: Internal Medicine | Admitting: Internal Medicine

## 2021-11-06 DIAGNOSIS — I70238 Atherosclerosis of native arteries of right leg with ulceration of other part of lower right leg: Secondary | ICD-10-CM | POA: Insufficient documentation

## 2021-11-06 DIAGNOSIS — I255 Ischemic cardiomyopathy: Secondary | ICD-10-CM | POA: Insufficient documentation

## 2021-11-06 DIAGNOSIS — Z950 Presence of cardiac pacemaker: Secondary | ICD-10-CM | POA: Insufficient documentation

## 2021-11-06 DIAGNOSIS — X58XXXA Exposure to other specified factors, initial encounter: Secondary | ICD-10-CM | POA: Insufficient documentation

## 2021-11-06 DIAGNOSIS — I2510 Atherosclerotic heart disease of native coronary artery without angina pectoris: Secondary | ICD-10-CM | POA: Insufficient documentation

## 2021-11-06 DIAGNOSIS — L97812 Non-pressure chronic ulcer of other part of right lower leg with fat layer exposed: Secondary | ICD-10-CM | POA: Diagnosis not present

## 2021-11-06 DIAGNOSIS — S8011XA Contusion of right lower leg, initial encounter: Secondary | ICD-10-CM | POA: Insufficient documentation

## 2021-11-06 DIAGNOSIS — Z87891 Personal history of nicotine dependence: Secondary | ICD-10-CM | POA: Insufficient documentation

## 2021-11-06 DIAGNOSIS — L97818 Non-pressure chronic ulcer of other part of right lower leg with other specified severity: Secondary | ICD-10-CM | POA: Insufficient documentation

## 2021-11-06 DIAGNOSIS — Z95820 Peripheral vascular angioplasty status with implants and grafts: Secondary | ICD-10-CM | POA: Insufficient documentation

## 2021-11-06 DIAGNOSIS — I87311 Chronic venous hypertension (idiopathic) with ulcer of right lower extremity: Secondary | ICD-10-CM | POA: Insufficient documentation

## 2021-11-06 NOTE — Progress Notes (Signed)
COHL, BEHRENS ([id number]) Visit Report for 11/06/2021 HPI Details Patient Name: Date of Service: [name], [name] 11/06/2021 3:00 PM Medical Record Number: [id number] Patient Account Number: [id number] Date of Birth/Sex: Treating RN: [birth year] (88 y.o. [name] Primary Care Provider: [name] Other Clinician: Referring Provider: Treating Provider/Extender: [name] in Treatment: 5 History of Present Illness HPI Description: ADMISSION 09/26/21 This is a 88 year old man who lives near [place]. He is accompanied by his daughter who lives with him. His wife is also at home. Apparently 2 months ago he traumatized the right anterior lower leg x2 in quick succession including once on the back of a pickup truck or car. By description it sounds as though he had a black eschar on this which fell off about a month ago. He has been left with an open wound. It this is not that large but is completely slough covered and has undermining of at least a centimeter and half from 12-6 o'clock. He received a course of antibiotics earlier this month from his primary care doctor Ceftin. He is not on antibiotics currently. The patient has a complicated relevant history. 2019 he underwent bilateral common iliac artery stenting and angioplasties by Dr. Carlis Abbott. He had ABIs at the same time which showed a ABI at 0.55 on the right and a TBI of 0.38. He had a TBI of 0.43 on the left they could not do an ABI because he could not tolerate the pressure cuff. He has not followed up. The patient is apparently a diabetic although he has not been on any treatment in years and its not clear that he is even followed up. Other past medical history includes coronary artery disease, ischemic cardiomyopathy status post pacemaker, he is on Plavix, He did not cooperate with our attempt to do ABIs on the right leg in our clinic 5/5; patient presents for follow-up. Last week he was started on  Iodosorb under Kerlix/Coban. He has no issues or complaints today. He has his venous and arterial studies scheduled for next week. He currently denies signs of infection. 5/16; patient presents for follow-up. Last week he was started on Hydrofera Blue and gentamicin and mupirocin ointment under Kerlix/Coban. He tolerated this well. He had a PCR culture that reported high levels of Streptococcus agalactiae, Pseudomonas aeruginosa and Enterococcus bacillus. Keystone antibiotics were ordered and he has this however did not bring it today. He had ABIs with TBI's and venous reflux studies. He had evidence of reflux throughout the right and left venous system. His ABIs on the right was noncompressible with a TBI of 0.19. He currently denies systemic signs of infection. 5/23; patient presents for follow-up. He has been using Keystone antibiotic to the wound bed without issues. He currently denies signs of infection. He has not heard back from Dr. Kennon Holter office. 6/8; patient is back his wound is somewhat better with less undermining but still the same basic surface area. The wound surface looks better in terms of nonviable material. But the other issue that concerns me is this patient probably has some minimal distance claudication and may even have claudication at rest at night in the right leg. Referencing my notes from his admission he has had previous bilateral common iliac artery stenting and angioplasties in 2019. For some reason I do not know that he ever had any follow-up. Electronic Signature(s) Signed: 11/06/2021 4:23:09 PM By: Linton Ham MD Entered By: Linton Ham on 11/06/2021 15:59:17 -------------------------------------------------------------------------------- Physical Exam Details Patient Name: Date  of Service: Jonathon, Shea 11/06/2021 3:00 PM Medical Record Number: [id number] Patient Account Number: [id number] Date of Birth/Sex: Treating RN: [birth year] (88 y.o. [name] Primary Care Provider: [name] Other Clinician: Referring Provider: Treating Provider/Extender: [name] in Treatment: 5 Constitutional Patient is hypertensive.. Pulse regular and within target range for patient.Marland Kitchen Respirations regular, non-labored and within target range.. Temperature is normal and within the target range for the patient.Marland Kitchen Appears in no distress. Notes Wound exam; right lower extremity mid tibial area. Better looking wound surface than what I remember and there is less undermining. There is no surrounding infection. He has chronic venous insufficiency nonpitting edema. Peripheral pulses are absent and his foot is cool on the right Electronic Signature(s) Signed: 11/06/2021 4:23:09 PM By: Linton Ham MD Entered By: Linton Ham on 11/06/2021 16:00:02 -------------------------------------------------------------------------------- Physician Orders Details Patient Name: Date of Service: [name] 11/06/2021 3:00 PM Medical Record Number: [id number] Patient Account Number: [id number] Date of Birth/Sex: Treating RN: [birth year] (88 y.o. 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 - Dr. Heber [place] and [place], Room 8 11/17/2021 3pm Return A Other: - [facility] will call you to schedule an appt time for vascular consultation. If no one calls  you by Tuesday to schedule the appt time call [facility] at [phone]. Bathing/ Shower/ Hygiene May shower with protection but do not get wound dressing(s) wet. Edema Control - Lymphedema / SCD / Other Elevate legs to the level of the heart or above for 30 minutes daily and/or when sitting, a frequency of: - 3-4 times a day throughout the day. Avoid standing for long periods of time. Exercise regularly Wound Treatment Wound #1 - Lower Leg Wound Laterality: Right, Anterior Cleanser: Soap and Water 1 x Per Day/30 Days Discharge Instructions: May shower and wash wound with dial antibacterial soap and water prior to dressing change. Cleanser: Wound Cleanser (Generic) 1 x Per Day/30 Days Discharge Instructions: Cleanse the wound with wound cleanser prior to applying a clean dressing using gauze sponges, not tissue or cotton balls. Peri-Wound Care: Skin Prep (DME) (Generic) 1 x Per Day/30 Days Discharge Instructions: Use skin prep as directed Peri-Wound Care: Sween Lotion (Moisturizing lotion) 1 x Per Day/30 Days Discharge Instructions: Apply moisturizing lotion as directed Topical: Keystone compounding topical antibiotics 1 x Per Day/30 Days Discharge Instructions: apply daily directly to wound bed. Prim Dressing: Hydrofera Blue Ready Foam, 4x5 in 1 x Per Day/30 Days ary Discharge Instructions: Apply to wound bed as instructed Secondary Dressing: Zetuvit Plus Silicone Border Dressing 4x4 (in/in) (DME) (Generic) 1 x Per Day/30 Days Discharge Instructions: Apply silicone border over primary dressing as directed. 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) Electronic Signature(s) Signed: 11/06/2021 4:23:09 PM By: Linton Ham MD Signed: 11/06/2021 4:29:12 PM By: Deon Pilling RN, BSN Entered By: Deon Pilling on 11/06/2021 16:00:24 Prescription  11/06/2021 -------------------------------------------------------------------------------- Ezekiel Slocumb  Jackquline Bosch MD Patient Name: Provider: 06-30-1933 [id number] Date of Birth: NPI#: Jerilynn Mages [id number] Sex: DEA #: [phone] [id number] Phone #: License #: [place] Patient Address: [address], [address] [address] [place], [place] [address] [phone] Allergies Sulfa (Sulfonamide Antibiotics); prednisone Provider's Orders Vascular - ICD10: Q76.195 - Vascular consultation with [facility] Hand Signature: Date(s): Electronic Signature(s) Signed: 11/06/2021 4:23:09 PM By: Linton Ham MD Signed: 11/06/2021 4:29:12 PM By: Deon Pilling RN, BSN Entered By: Deon Pilling on 11/06/2021 16:00:24 -------------------------------------------------------------------------------- Problem List Details Patient Name: Date of Service: [name] 11/06/2021 3:00 PM Medical Record Number: [id number] Patient Account Number: [id number] Date of Birth/Sex: Treating RN: December 22, 1933 (88 y.o. [name], Meta.Reding Primary Care Provider: [name] Other Clinician: Referring Provider: Treating Provider/Extender: [name] in Treatment: 5 Active Problems ICD-10 Encounter Code Description Active Date MDM Diagnosis S80.11XD Contusion of right lower leg, subsequent encounter 09/26/2021 No Yes L97.818 Non-pressure chronic ulcer of other part of right lower leg with other specified 09/26/2021 No Yes severity I87.311 Chronic venous hypertension (idiopathic) with ulcer of right lower extremity 09/26/2021 No Yes I70.238 Atherosclerosis of native arteries of right leg with ulceration of other part of 10/14/2021 No Yes lower leg Inactive Problems Resolved Problems Electronic Signature(s) Signed: 11/06/2021 4:23:09 PM By: Linton Ham MD Entered By: Linton Ham on 11/06/2021  15:57:27 -------------------------------------------------------------------------------- Progress Note Details Patient Name: Date of Service: [name] 11/06/2021 3:00 PM Medical Record Number: [id number] Patient Account Number: [id number] Date of Birth/Sex: Treating RN: 01/24/1934 (88 y.o. [name] Primary Care Provider: [name] Other Clinician: Referring Provider: Treating Provider/Extender: [name] in Treatment: 5 Subjective History of Present Illness (HPI) ADMISSION 09/26/21 This is a 88 year old man who lives near [place]. He is accompanied by his daughter who lives with him. His wife is also at home. Apparently 2 months ago he traumatized the right anterior lower leg x2 in quick succession including once on the back of a pickup truck or car. By description it sounds as though he had a black eschar on this which fell off about a month ago. He has been left with an open wound. It this is not that large but is completely slough covered and has undermining of at least a centimeter and half from 12-6 o'clock. He received a course of antibiotics earlier this month from his primary care doctor Ceftin. He is not on antibiotics currently. The patient has a complicated relevant history. 2019 he underwent bilateral common iliac artery stenting and angioplasties by Dr. Carlis Abbott. He had ABIs at the same time which showed a ABI at 0.55 on the right and a TBI of 0.38. He had a TBI of 0.43 on the left they could not do an ABI because he could not tolerate the pressure cuff. He has not followed up. The patient is apparently a diabetic although he has not been on any treatment in years and its not clear that he is even followed up. Other past medical history includes coronary artery disease, ischemic cardiomyopathy status post pacemaker, he is on Plavix, He did not cooperate with our attempt to do ABIs on the right leg in our clinic 5/5; patient presents for  follow-up. Last week he was started on Iodosorb under Kerlix/Coban. He has no issues or complaints today. He has his venous and arterial studies scheduled for next week. He currently denies signs of infection. 5/16; patient  presents for follow-up. Last week he was started on Hydrofera Blue and gentamicin and mupirocin ointment under Kerlix/Coban. He tolerated this well. He had a PCR culture that reported high levels of Streptococcus agalactiae, Pseudomonas aeruginosa and Enterococcus bacillus. Keystone antibiotics were ordered and he has this however did not bring it today. He had ABIs with TBI's and venous reflux studies. He had evidence of reflux throughout the right and left venous system. His ABIs on the right was noncompressible with a TBI of 0.19. He currently denies systemic signs of infection. 5/23; patient presents for follow-up. He has been using Keystone antibiotic to the wound bed without issues. He currently denies signs of infection. He has not heard back from Dr. Kennon Holter office. 6/8; patient is back his wound is somewhat better with less undermining but still the same basic surface area. The wound surface looks better in terms of nonviable material. But the other issue that concerns me is this patient probably has some minimal distance claudication and may even have claudication at rest at night in the right leg. Referencing my notes from his admission he has had previous bilateral common iliac artery stenting and angioplasties in 2019. For some reason I do not know that he ever had any follow-up. Objective Constitutional Patient is hypertensive.. Pulse regular and within target range for patient.Marland Kitchen Respirations regular, non-labored and within target range.. Temperature is normal and within the target range for the patient.Marland Kitchen Appears in no distress. Vitals Time Taken: 3:09 PM, Height: 69 in, Weight: 168 lbs, BMI: 24.8, Temperature: 97.7 F, Pulse: 64 bpm, Respiratory Rate: 16  breaths/min, Blood Pressure: 153/61 mmHg. General Notes: Wound exam; right lower extremity mid tibial area. Better looking wound surface than what I remember and there is less undermining. There is no surrounding infection. He has chronic venous insufficiency nonpitting edema. Peripheral pulses are absent and his foot is cool on the right Integumentary (Hair, Skin) Wound #1 status is Open. Original cause of wound was Trauma. The date acquired was: 07/02/2021. The wound has been in treatment 5 weeks. The wound is located on the Right,Anterior Lower Leg. The wound measures 2.9cm length x 2.7cm width x 0.3cm depth; 6.15cm^2 area and 1.845cm^3 volume. There is Fat Layer (Subcutaneous Tissue) exposed. There is a medium amount of serosanguineous drainage noted. The wound margin is epibole. There is large (67-100%) red, pink granulation within the wound bed. There is a small (1-33%) amount of necrotic tissue within the wound bed including Adherent Slough. 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 - Dr. Heber [place] and [place], Room 8 11/17/2021 3pm Other: - [facility] will call you to schedule an appt time for vascular consultation. If no one calls you by Tuesday to schedule the appt time call [facility] at [phone]. Bathing/ Shower/ Hygiene: May shower with protection but do not get wound dressing(s) wet. Edema Control - Lymphedema / SCD / Other: Elevate legs to the level of the heart or above for 30 minutes daily and/or when sitting, a frequency of: - 3-4 times a day throughout the day. Avoid standing for long periods of  time. Exercise regularly Consults ordered were: Vascular - Vascular consultation with [facility] WOUND #1: -  Lower Leg Wound Laterality: Right, Anterior Cleanser: Soap and Water 1 x Per Day/30 Days Discharge Instructions: May shower and wash wound with dial antibacterial soap and water prior to dressing change. Cleanser: Wound Cleanser (Generic) 1 x Per Day/30 Days Discharge Instructions: Cleanse the wound with wound cleanser prior to applying a clean dressing using gauze sponges, not tissue or cotton balls. Peri-Wound Care: Skin Prep (DME) (Generic) 1 x Per Day/30 Days Discharge Instructions: Use skin prep as directed Peri-Wound Care: Sween Lotion (Moisturizing lotion) 1 x Per Day/30 Days Discharge Instructions: Apply moisturizing lotion as directed Topical: Keystone compounding topical antibiotics 1 x Per Day/30 Days Discharge Instructions: apply daily directly to wound bed. Prim Dressing: Hydrofera Blue Ready Foam, 4x5 in 1 x Per Day/30 Days ary Discharge Instructions: Apply to wound bed as instructed Secondary Dressing: Zetuvit Plus Silicone Border Dressing 4x4 (in/in) (DME) (Generic) 1 x Per Day/30 Days Discharge Instructions: Apply silicone border over primary dressing as directed. 1. This patient has a nonhealing wound on his right anterior lower mid tibia which was initially traumatic probably in the setting of chronic venous insufficiency/lymphedema but also very significant PAD. 2. Historically he has significant claudication maybe even claudication at rest. 3. I think he needs to see vascular surgery. They previously did his interventions and have done his arterial and venous reflux studies. 4. I reviewed Dr. Rosezella [place] notes. He does have a cardiomyopathy which is severe with an echo of 25%. This is likely to be ischemic. Electronic Signature(s) Signed: 11/06/2021 4:23:09 PM By: Linton Ham MD Entered By: Linton Ham on 11/06/2021  16:02:14 -------------------------------------------------------------------------------- SuperBill Details Patient Name: Date of Service: [name] 11/06/2021 Medical Record Number: [id number] Patient Account Number: [id number] Date of Birth/Sex: Treating RN: 1933/11/21 (88 y.o. 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

## 2021-11-10 DIAGNOSIS — S81801A Unspecified open wound, right lower leg, initial encounter: Secondary | ICD-10-CM | POA: Diagnosis not present

## 2021-11-10 DIAGNOSIS — Z79899 Other long term (current) drug therapy: Secondary | ICD-10-CM | POA: Diagnosis not present

## 2021-11-11 LAB — BASIC METABOLIC PANEL
BUN/Creatinine Ratio: 19 (ref 10–24)
BUN: 27 mg/dL (ref 8–27)
CO2: 23 mmol/L (ref 20–29)
Calcium: 9.2 mg/dL (ref 8.6–10.2)
Chloride: 105 mmol/L (ref 96–106)
Creatinine, Ser: 1.45 mg/dL — ABNORMAL HIGH (ref 0.76–1.27)
Glucose: 104 mg/dL — ABNORMAL HIGH (ref 70–99)
Potassium: 4.8 mmol/L (ref 3.5–5.2)
Sodium: 140 mmol/L (ref 134–144)
eGFR: 47 mL/min/{1.73_m2} — ABNORMAL LOW (ref >59)

## 2021-11-11 NOTE — Progress Notes (Signed)
JANOS, SHAMPINE ([id number]) Visit Report for 11/06/2021 Arrival Information Details Patient Name: Date of Service: [name], [name] 11/06/2021 3:00 PM Medical Record Number: [id number] Patient Account Number: [id number] Date of Birth/Sex: Treating RN: 24-May-1934 (88 y.o. [name], Meta.Reding Primary Care Jalan Bodi: [name] Other Clinician: Referring Gwenneth Whiteman: Treating Myan Locatelli/Extender: [name] in Treatment: 5 Visit Information History Since Last Visit Added or deleted any medications: No Patient Arrived: Ambulatory Any new allergies or adverse reactions: No Arrival Time: 15:07 Had a fall or experienced change in No Accompanied By: family activities of daily living that may affect Transfer Assistance: None risk of falls: Patient Requires Transmission-Based Precautions: No Signs or symptoms of abuse/neglect since last visito No Patient Has Alerts: Yes Hospitalized since last visit: No Patient Alerts: EPP:IRJJOA to obtain;pain Implantable device outside of the clinic excluding No cellular tissue based products placed in the center since last visit: Has Dressing in Place as Prescribed: Yes Pain Present Now: No Electronic Signature(s) Signed: 11/11/2021 8:46:10 AM By: Erenest Blank Entered By: Erenest Blank on 11/06/2021 15:08:05 -------------------------------------------------------------------------------- Encounter Discharge Information Details Patient Name: Date of Service: [name]. 11/06/2021 3:00 PM Medical Record Number: [id number] Patient Account Number: [id number] Date of Birth/Sex: Treating RN: [birth year] (88 y.o. [name] Primary Care Tavionna Grout: [name] Other Clinician: Referring Radiance Deady: Treating Luceil Herrin/Extender: [name] in Treatment: 5 Encounter Discharge Information Items Discharge Condition: Stable Ambulatory Status: Ambulatory Discharge Destination: Home Transportation: Private  Auto Accompanied By: daughter Schedule Follow-up Appointment: Yes Clinical Summary of Care: Electronic Signature(s) Signed: 11/06/2021 4:29:12 PM By: Deon Pilling RN, BSN Entered By: Deon Pilling on 11/06/2021 16:01:44 -------------------------------------------------------------------------------- Lower Extremity Assessment Details Patient Name: Date of Service: [name]. 11/06/2021 3:00 PM Medical Record Number: [id number] Patient Account Number: [id number] Date of Birth/Sex: Treating RN: 07-04-1933 (88 y.o. [name] Primary Care Kelten Enochs: [name] Other Clinician: Referring Aissatou Fronczak: Treating Kelsey Edman/Extender: [name] in Treatment: 5 Edema Assessment Assessed: [name]: No] [name]: No] Edema: [Left: Ye] [Right: s] Calf Left: Right: Point of Measurement: 31 cm From Medial Instep 33.7 cm Ankle Left: Right: Point of Measurement: 7 cm From Medial Instep 26.8 cm Vascular Assessment Pulses: Dorsalis Pedis Palpable: [Right:Yes] Electronic Signature(s) Signed: 11/06/2021 4:29:12 PM By: Deon Pilling RN, BSN Signed: 11/11/2021 8:46:10 AM By: Erenest Blank Entered By: Erenest Blank on 11/06/2021 15:16:19 -------------------------------------------------------------------------------- Multi Wound Chart Details Patient Name: Date of Service: [name]. 11/06/2021 3:00 PM Medical Record Number: [id number] Patient Account Number: [id number] Date of Birth/Sex: Treating RN: [birth year] (88 y.o. [name], Meta.Reding Primary Care Kelise Kuch: [name] Other Clinician: Referring Onica Davidovich: Treating Clovia Reine/Extender: [name] in Treatment: 5 Vital Signs Height(in): 39 Pulse(bpm): 49 Weight(lbs): 86 Blood Pressure(mmHg): 153/61 Body Mass Index(BMI): 24.8 Temperature(F): 97.7 Respiratory Rate(breaths/min): 16 Photos: [1:Right, Anterior Lower Leg] [N/A:N/A N/A] Wound Location: [1:Trauma]  [N/A:N/A] Wounding Event: [1:Abrasion] [N/A:N/A] Primary Etiology: [1:Diabetic Wound/Ulcer of the Lower] [N/A:N/A] Secondary Etiology: [1:Extremity Cataracts, Anemia, Asthma, Chronic N/A] Comorbid History: [1:Obstructive Pulmonary Disease (COPD), Congestive Heart Failure, Coronary Artery Disease, Hypertension, Myocardial Infarction, Peripheral Arterial Disease, Type II Diabetes 07/02/2021] [N/A:N/A] Date Acquired: [1:5] [N/A:N/A] Weeks of Treatment: [1:Open] [N/A:N/A] Wound Status: [1:No] [N/A:N/A] Wound Recurrence: [1:2.9x2.7x0.3] [N/A:N/A] Measurements L x W x D (cm) [1:6.15] [N/A:N/A] A (cm) : rea [1:1.845] [N/A:N/A] Volume (cm) : [1:-70.60%] [N/A:N/A] % Reduction in Area: [1:-27.90%] [N/A:N/A] % Reduction in Volume: [1:Full Thickness Without Exposed] [N/A:N/A] Classification: [1:Support Structures Medium] [N/A:N/A] Exudate Amount: [1:Serosanguineous] [N/A:N/A] Exudate Type: [1:red,  brown] [N/A:N/A] Exudate Color: [1:Epibole] [N/A:N/A] Wound Margin: [1:Large (67-100%)] [N/A:N/A] Granulation Amount: [1:Red, Pink] [N/A:N/A] Granulation Quality: [1:Small (1-33%)] [N/A:N/A] Necrotic Amount: [1:Fat Layer (Subcutaneous Tissue): Yes N/A] Exposed Structures: [1:Fascia: No Tendon: No Muscle: No Joint: No Bone: No Small (1-33%)] [N/A:N/A] Treatment Notes Electronic Signature(s) Signed: 11/06/2021 4:23:09 PM By: Linton Ham MD Signed: 11/06/2021 4:29:12 PM By: Deon Pilling RN, BSN Entered By: Linton Ham on 11/06/2021 15:57:35 -------------------------------------------------------------------------------- Multi-Disciplinary Care Plan Details Patient Name: Date of Service: [name]. 11/06/2021 3:00 PM Medical Record Number: [id number] Patient Account Number: [id number] Date of Birth/Sex: Treating RN: 01-Jul-1933 (88 y.o. [name] Primary Care Kelly Eisler: [name] Other Clinician: Referring Romari Gasparro: Treating Jayvien Rowlette/Extender: [name] in Treatment: 5 Active Inactive Tissue Oxygenation Nursing Diagnoses: Potential alteration in peripheral tissue perfusion (select prior to confirmation of diagnosis) Goals: Non-invasive arterial studies are completed as ordered Date Initiated: 10/14/2021 Target Resolution Date: 11/28/2021 Goal Status: Active Interventions: Assess patient understanding of disease process and management upon diagnosis and as needed Assess peripheral arterial status upon admission and as needed Provide education on tissue oxygenation and ischemia Treatment Activities: Ankle Brachial Index (ABI) : 09/30/2021 Non-invasive vascular studies : 10/14/2021 Notes: Wound/Skin Impairment Nursing Diagnoses: Knowledge deficit related to ulceration/compromised skin integrity Goals: Patient/caregiver will verbalize understanding of skin care regimen Date Initiated: 09/26/2021 Target Resolution Date: 11/28/2021 Goal Status: Active Interventions: Assess patient/caregiver ability to perform ulcer/skin care regimen upon admission and as needed Assess ulceration(s) every visit Provide education on ulcer and skin care Treatment Activities: Skin care regimen initiated : 09/26/2021 Topical wound management initiated : 09/26/2021 Notes: 10/21/21: Wound care regimen continues, using [employer] and [employer]. Electronic Signature(s) Signed: 11/06/2021 4:29:12 PM By: Deon Pilling RN, BSN Entered By: Deon Pilling on 11/06/2021 15:50:04 -------------------------------------------------------------------------------- Pain Assessment Details Patient Name: Date of Service: [name]. 11/06/2021 3:00 PM Medical Record Number: [id number] Patient Account Number: [id number] Date of Birth/Sex: Treating RN: 05/04/1934 (88 y.o. [name] Primary Care Kodiak Rollyson: [name] Other Clinician: Referring Jewel Mcafee: Treating Abrielle Finck/Extender: [name] in Treatment: 5 Active  Problems Location of Pain Severity and Description of Pain Patient Has Paino No Site Locations Pain Management and Medication Current Pain Management: Electronic Signature(s) Signed: 11/06/2021 4:29:12 PM By: Deon Pilling RN, BSN Signed: 11/11/2021 8:46:10 AM By: Erenest Blank Entered By: Erenest Blank on 11/06/2021 15:10:33 -------------------------------------------------------------------------------- Patient/Caregiver Education Details Patient Name: Date of Service: [name], Jonathon W. 6/8/2023andnbsp3:00 PM Medical Record Number: [id number] Patient Account Number: [id number] Date of Birth/Gender: Treating RN: 17-Feb-1934 (88 y.o. [name] Primary Care Physician: [name] Other Clinician: Referring Physician: Treating Physician/Extender: [name] in Treatment: 5 Education Assessment Education Provided To: Patient Education Topics Provided Wound/Skin Impairment: Handouts: Skin Care Do's and Dont's Methods: Explain/Verbal Responses: Reinforcements needed Electronic Signature(s) Signed: 11/06/2021 4:29:12 PM By: Deon Pilling RN, BSN Entered By: Deon Pilling on 11/06/2021 15:50:21 -------------------------------------------------------------------------------- Wound Assessment Details Patient Name: Date of Service: [name]. 11/06/2021 3:00 PM Medical Record Number: [id number] Patient Account Number: [id number] Date of Birth/Sex: Treating RN: 03/30/1934 (88 y.o. [name] Primary Care Mylin Hirano: [name] Other Clinician: Referring Kymari Lollis: Treating Lisaann Atha/Extender: [name] in Treatment: 5 Wound Status Wound Number: 1 Primary Abrasion Etiology: Wound Location: Right, Anterior Lower Leg Secondary Diabetic Wound/Ulcer of the Lower Extremity Wounding Event: Trauma Etiology: Date Acquired: 07/02/2021 Wound Open Weeks Of Treatment: 5 Status: Clustered Wound: No Comorbid Cataracts,  Anemia, Asthma, Chronic Obstructive Pulmonary History: Disease (  COPD), Congestive Heart Failure, Coronary Artery Disease, Hypertension, Myocardial Infarction, Peripheral Arterial Disease, Type II Diabetes Photos Wound Measurements Length: (cm) 2.9 Width: (cm) 2.7 Depth: (cm) 0.3 Area: (cm) 6.15 Volume: (cm) 1.845 % Reduction in Area: -70.6% % Reduction in Volume: -27.9% Epithelialization: Small (1-33%) Wound Description Classification: Full Thickness Without Exposed Support Structures Wound Margin: Epibole Exudate Amount: Medium Exudate Type: Serosanguineous Exudate Color: red, brown Foul Odor After Cleansing: No Slough/Fibrino Yes Wound Bed Granulation Amount: Large (67-100%) Exposed Structure Granulation Quality: Red, Pink Fascia Exposed: No Necrotic Amount: Small (1-33%) Fat Layer (Subcutaneous Tissue) Exposed: Yes Necrotic Quality: Adherent Slough Tendon Exposed: No Muscle Exposed: No Joint Exposed: No Bone Exposed: No Treatment Notes Wound #1 (Lower Leg) Wound Laterality: Right, Anterior Cleanser Soap and Water Discharge Instruction: May shower and wash wound with dial antibacterial soap and water prior to dressing change. Wound Cleanser Discharge Instruction: Cleanse the wound with wound cleanser prior to applying a clean dressing using gauze sponges, not tissue or cotton balls. Peri-Wound Care Skin Prep Discharge Instruction: Use skin prep as directed Sween Lotion (Moisturizing lotion) Discharge Instruction: Apply moisturizing lotion as directed Topical [employer] compounding topical antibiotics Discharge Instruction: apply daily directly to wound bed. Primary Dressing Hydrofera Blue Ready Foam, 4x5 in Discharge Instruction: Apply to wound bed as instructed Secondary Dressing Zetuvit Plus Silicone Border Dressing 4x4 (in/in) Discharge Instruction: Apply silicone border over primary dressing as directed. Secured With Compression Wrap Compression  Stockings Environmental education officer) Signed: 11/06/2021 4:29:12 PM By: Deon Pilling RN, BSN Signed: 11/11/2021 8:46:10 AM By: Erenest Blank Entered By: Erenest Blank on 11/06/2021 15:27:16 -------------------------------------------------------------------------------- Vitals Details Patient Name: Date of Service: [name]. 11/06/2021 3:00 PM Medical Record Number: [id number] Patient Account Number: [id number] Date of Birth/Sex: Treating RN: November 22, 1933 (88 y.o. [name], [name] Primary Care Brown Dunlap: [name] Other Clinician: Referring Verlie Hellenbrand: Treating Harlo Fabela/Extender: [name] in Treatment: 5 Vital Signs Time Taken: 15:09 Temperature (F): 97.7 Height (in): 69 Pulse (bpm): 64 Weight (lbs): 168 Respiratory Rate (breaths/min): 16 Body Mass Index (BMI): 24.8 Blood Pressure (mmHg): 153/61 Reference Range: 80 - 120 mg / dl Electronic Signature(s) Signed: 11/11/2021 8:46:10 AM By: Erenest Blank Entered By: Erenest Blank on 11/06/2021 15:10:24

## 2021-11-12 ENCOUNTER — Encounter: Payer: Self-pay | Admitting: *Deleted

## 2021-11-13 NOTE — H&P (View-Only) (Signed)
[facility] [place]  ASSESSMENT / PLAN: [name] is a 88 y.o. 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  WIfI score 1 / 3 / 0 . Moderate amputation risk. High potential benefit from revascularization.  Recommend the following which can slow the progression of atherosclerosis and reduce the risk of major adverse cardiac / limb events:  Complete cessation from all tobacco products. Blood glucose control with goal A1c < 7%. Blood pressure control with goal blood pressure < 140/90 mmHg. Lipid reduction therapy with goal LDL-C <100 mg/dL (<70 if symptomatic from PAD).  Aspirin '81mg'$  PO QD.  Atorvastatin 40-'80mg'$  PO QD (or other "high intensity" statin therapy).  Previous angiogram reviewed in detail.  He has known bulky right common femoral artery plaque and right superficial femoral artery occlusion.  He is not a good candidate for open vascular surgery with his history of congestive heart failure, COPD, CKD 3 etc., but this may be his only option. Will check CT angiogram to evaluate iliac stents and right common femoral disease.   CHIEF COMPLAINT: right calf wound  HISTORY OF PRESENT ILLNESS: [name] is a 88 y.o. male who presents to clinic for evaluation of right calf wound.  He is well-known to our practice having undergone bilateral common iliac artery stenting in 2019 for ischemic rest pain.  He has now developed a nonhealing ulcer on his right pretibial skin.  The wound is incredibly painful to him.  He also has persistent swelling in his lower extremities likely due to congestive heart failure.  He is multiple chronic medical comorbidities (see below).  Past Medical History:  Diagnosis Date   Atrial flutter ([place])    (I could not find documentation of this rhythm.)   AUTOMATIC IMPLANTABLE CARDIAC DEFIBRILLATOR SITU    Automatic implantable cardioverter-defibrillator in situ 12/05/2009   Qualifier:  Diagnosis of  By: [name], MD, [facility], Binnie Kand    CAD (coronary artery disease) 05/22/2011   Carotid stenosis 05/22/2011   CHF CONGESTIVE HEART FAILURE    45% by echo 2015   COPD 12/01/2007   Qualifier: Diagnosis of  By: [name] NP, [name]     DM2 (diabetes mellitus, type 2) ([place])    DYSLIPIDEMIA    DYSPNEA    Edema    Essential hypertension 11/17/2007   Qualifier: Diagnosis of  By: [name]     Gout    HYPERTENSION    HYPOTHYROIDISM    MYOCARDIAL INFARCTION    MI 1999, stent x 2 to RCA, 70% circ, 30 and 40 % LADs   S/P updgrade to CRT-D 11/23/2020   WEIGHT GAIN, ABNORMAL     Past Surgical History:  Procedure Laterality Date   ANGIOPLASTY     stent   APPENDECTOMY  1957   BIV UPGRADE N/A 11/22/2020   Procedure: BIV ICD UPGRADE;  Surgeon: Evans Lance, MD;  Location: [place] [facility];  Service: Cardiovascular;  Laterality: N/A;   CARDIAC DEFIBRILLATOR PLACEMENT     CAROTID STENT     EYE SURGERY  1985   LOWER EXTREMITY ANGIOGRAPHY N/A 05/04/2018   Procedure: LOWER EXTREMITY ANGIOGRAPHY;  Surgeon: [name], MD;  Location: [place] [facility];  Service: Cardiovascular;  Laterality: N/A;   PERIPHERAL VASCULAR INTERVENTION Bilateral 05/04/2018   Procedure: PERIPHERAL VASCULAR INTERVENTION;  Surgeon: [name], MD;  Location: [place] [facility];  Service: Cardiovascular;  Laterality: Bilateral;  [place]    Family History  Problem Relation Age of Onset   Other Mother        natural causes   Kidney disease Father    Cancer Brother        throat cancer    Social History   Socioeconomic History   Marital status: Married    Spouse name: Ila   Number of children: 3   Years of education: Not on file   Highest education level: Not on file  Occupational History   Not on file  Tobacco Use   Smoking status: Former    Types: Cigarettes    Quit date: 12/10/1983    Years since quitting: 37.9   Smokeless tobacco: Never   Tobacco comments:    quit  1985  Substance and Sexual Activity   Alcohol use: No   Drug use: No   Sexual activity: Not on file  Other Topics Concern   Not on file  Social History Narrative   Not on file   Social Determinants of Health   Financial Resource Strain: Not on file  Food Insecurity: Not on file  Transportation Needs: Not on file  Physical Activity: Not on file  Stress: Not on file  Social Connections: Not on file  Intimate Partner Violence: Not on file    Allergies  Allergen Reactions   Prednisone Rash   Sulfa Antibiotics Other (See Comments)    Hyper     Current Outpatient Medications  Medication Sig Dispense Refill   ACCU-CHEK AVIVA PLUS test strip      acetaminophen (TYLENOL) 650 MG CR tablet Take 650 mg by mouth every 8 (eight) hours as needed for pain.     aspirin EC 81 MG tablet Take 81 mg by mouth in the morning. Swallow whole.     atorvastatin (LIPITOR) 40 MG tablet TAKE 1 TABLET (40 MG TOTAL) BY MOUTH IN THE MORNING. 90 tablet 3   carboxymethylcellul-glycerin (REFRESH OPTIVE) 0.5-0.9 % ophthalmic solution Place 2 drops into both eyes daily.     clopidogrel (PLAVIX) 75 MG tablet TAKE 1 TABLET (75 MG TOTAL) BY MOUTH IN THE MORNING. 90 tablet 1   FLOVENT HFA 110 MCG/ACT inhaler Inhale 2 puffs into the lungs 2 (two) times daily.  3   fluticasone (FLONASE) 50 MCG/ACT nasal spray Place 2 sprays into the nose in the morning.     furosemide (LASIX) 20 MG tablet Take 1 tablet (20 mg total) by mouth in the morning. 90 tablet 3   hydrALAZINE (APRESOLINE) 10 MG tablet Take 1 tablet (10 mg total) by mouth in the morning and at bedtime. 180 tablet 3   ipratropium-albuterol (DUONEB) 0.5-2.5 (3) MG/3ML SOLN Take 3 mLs by nebulization every 6 (six) hours as needed (wheezing/shortness of breath).     isosorbide mononitrate (IMDUR) 30 MG 24 hr tablet Take 1 tablet (30 mg total) by mouth daily. 90 tablet 3   metoprolol (TOPROL-XL) 200 MG 24 hr tablet Take 0.5 tablets (100 mg total) by mouth in the  morning and at bedtime. 90 tablet 3   mirtazapine (REMERON) 30 MG tablet TAKE 1 TABLET BY MOUTH EVERY DAY 90 tablet 1   montelukast (SINGULAIR) 10 MG tablet Take 1 tablet (10 mg total) by mouth daily. 90 tablet 1   mupirocin ointment (BACTROBAN) 2 % Apply topically daily.     pantoprazole (PROTONIX) 40 MG tablet Take 40 mg by mouth 2 (two) times daily. Morning & Evening     Polyethyl Glycol-Propyl Glycol (SYSTANE OP) Place 1 drop into  both eyes in the morning and at bedtime.     SYNTHROID 200 MCG tablet TAKE 1 TABLET (200 MCG TOTAL) BY MOUTH IN THE MORNING. 90 tablet 0   vitamin B-12 (CYANOCOBALAMIN) 1000 MCG tablet Take 1,000 mcg by mouth in the morning.     No current facility-administered medications for this visit.    PHYSICAL EXAM Vitals:   11/14/21 1123  BP: (!) 171/72  Pulse: (!) 49  Resp: 20  Temp: 98.3 F (36.8 C)  SpO2: 95%  Weight: 159 lb (72.1 kg)  Height: '5\' 8"'$  (1.727 m)    Constitutional: Chronically ill-appearing elderly gentleman in no acute distress Cardiac: Regular rate and rhythm.  Respiratory:  unlabored. Abdominal:  soft, non-tender, non-distended.  Peripheral vascular: No palpable pedal pulses.  Deep pretibial ulceration in the right lower extremity.   PERTINENT LABORATORY AND RADIOLOGIC DATA  Most recent CBC    Latest Ref Rng & Units 04/09/2021    3:54 PM 02/06/2021    1:05 PM 11/08/2020   10:37 AM  CBC  WBC 4.0 - 10.5 K/uL 8.5  6.3  6.3   Hemoglobin 13.0 - 17.0 g/dL 10.3  10.1  10.3   Hematocrit 39.0 - 52.0 % 31.5  31.3  30.6   Platelets 150.0 - 400.0 K/uL 211.0  214  246      Most recent CMP    Latest Ref Rng & Units 11/10/2021    3:40 PM 09/08/2021    3:38 PM 04/30/2021    1:04 PM  CMP  Glucose 70 - 99 mg/dL 104  93    BUN 8 - 27 mg/dL 27  35    Creatinine 0.76 - 1.27 mg/dL 1.45  1.43    Sodium 134 - 144 mmol/L 140  141    Potassium 3.5 - 5.2 mmol/L 4.8  5.2    Chloride 96 - 106 mmol/L 105  108    CO2 20 - 29 mmol/L 23  21    Calcium  8.6 - 10.2 mg/dL 9.2  9.3    Total Protein 6.0 - 8.5 g/dL   7.2   Total Bilirubin 0.0 - 1.2 mg/dL   0.6   Alkaline Phos 44 - 121 IU/L   48   AST 0 - 40 IU/L   21   ALT 0 - 44 IU/L   10     Renal function Estimated Creatinine Clearance: 36 mL/min (A) (by C-G formula based on SCr of 1.45 mg/dL (H)).  No results found for: "HGBA1C"  LDL Chol Calc (NIH)  Date Value Ref Range Status  04/30/2021 56 0 - 99 mg/dL Final     Vascular Imaging: +-------+-----------+-----------+----------------------------+------------+   ABI/TBIToday's ABIToday's TBIPrevious ABI                Previous  TBI  +-------+-----------+-----------+----------------------------+------------+   Right  [place]         0.19       0.55                        0.38            +-------+-----------+-----------+----------------------------+------------+   Left            0.42       unable to obtain due to pain0.43            +-------+-----------+-----------+----------------------------+------------+   Yevonne Aline. Stanford Breed, MD [facility] Phone Number: [phone] 11/13/2021 12:30 PM  Total time spent on preparing this encounter including chart review, data review, collecting history, examining the patient, coordinating care for this new patient, 60 minutes.  Portions of this report may have been transcribed using voice recognition software.  Every effort has been made to ensure accuracy; however, inadvertent computerized transcription errors may still be present.

## 2021-11-13 NOTE — Progress Notes (Signed)
[facility] [place]  ASSESSMENT / PLAN: [name] is a 88 y.o. 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  WIfI score 1 / 3 / 0 . Moderate amputation risk. High potential benefit from revascularization.  Recommend the following which can slow the progression of atherosclerosis and reduce the risk of major adverse cardiac / limb events:  Complete cessation from all tobacco products. Blood glucose control with goal A1c < 7%. Blood pressure control with goal blood pressure < 140/90 mmHg. Lipid reduction therapy with goal LDL-C <100 mg/dL (<70 if symptomatic from PAD).  Aspirin '81mg'$  PO QD.  Atorvastatin 40-'80mg'$  PO QD (or other "high intensity" statin therapy).  Previous angiogram reviewed in detail.  He has known bulky right common femoral artery plaque and right superficial femoral artery occlusion.  He is not a good candidate for open vascular surgery with his history of congestive heart failure, COPD, CKD 3 etc., but this may be his only option. Will check CT angiogram to evaluate iliac stents and right common femoral disease.   CHIEF COMPLAINT: right calf wound  HISTORY OF PRESENT ILLNESS: [name] is a 88 y.o. male who presents to clinic for evaluation of right calf wound.  He is well-known to our practice having undergone bilateral common iliac artery stenting in 2019 for ischemic rest pain.  He has now developed a nonhealing ulcer on his right pretibial skin.  The wound is incredibly painful to him.  He also has persistent swelling in his lower extremities likely due to congestive heart failure.  He is multiple chronic medical comorbidities (see below).  Past Medical History:  Diagnosis Date   Atrial flutter ([place])    (I could not find documentation of this rhythm.)   AUTOMATIC IMPLANTABLE CARDIAC DEFIBRILLATOR SITU    Automatic implantable cardioverter-defibrillator in situ 12/05/2009   Qualifier:  Diagnosis of  By: [name], MD, [facility], Binnie Kand    CAD (coronary artery disease) 05/22/2011   Carotid stenosis 05/22/2011   CHF CONGESTIVE HEART FAILURE    45% by echo 2015   COPD 12/01/2007   Qualifier: Diagnosis of  By: [name] NP, [name]     DM2 (diabetes mellitus, type 2) ([place])    DYSLIPIDEMIA    DYSPNEA    Edema    Essential hypertension 11/17/2007   Qualifier: Diagnosis of  By: [name]     Gout    HYPERTENSION    HYPOTHYROIDISM    MYOCARDIAL INFARCTION    MI 1999, stent x 2 to RCA, 70% circ, 30 and 40 % LADs   S/P updgrade to CRT-D 11/23/2020   WEIGHT GAIN, ABNORMAL     Past Surgical History:  Procedure Laterality Date   ANGIOPLASTY     stent   APPENDECTOMY  1957   BIV UPGRADE N/A 11/22/2020   Procedure: BIV ICD UPGRADE;  Surgeon: Evans Lance, MD;  Location: [facility];  Service: Cardiovascular;  Laterality: N/A;   CARDIAC DEFIBRILLATOR PLACEMENT     CAROTID STENT     EYE SURGERY  1985   LOWER EXTREMITY ANGIOGRAPHY N/A 05/04/2018   Procedure: LOWER EXTREMITY ANGIOGRAPHY;  Surgeon: [name], MD;  Location: [facility];  Service: Cardiovascular;  Laterality: N/A;   PERIPHERAL VASCULAR INTERVENTION Bilateral 05/04/2018   Procedure: PERIPHERAL VASCULAR INTERVENTION;  Surgeon: [name], MD;  Location: [facility];  Service: Cardiovascular;  Laterality: Bilateral;  [place]    Family History  Problem Relation Age of Onset   Other Mother        natural causes   Kidney disease Father    Cancer Brother        throat cancer    Social History   Socioeconomic History   Marital status: Married    Spouse name: Ila   Number of children: 3   Years of education: Not on file   Highest education level: Not on file  Occupational History   Not on file  Tobacco Use   Smoking status: Former    Types: Cigarettes    Quit date: 12/10/1983    Years since quitting: 37.9   Smokeless tobacco: Never   Tobacco comments:    quit  1985  Substance and Sexual Activity   Alcohol use: No   Drug use: No   Sexual activity: Not on file  Other Topics Concern   Not on file  Social History Narrative   Not on file   Social Determinants of Health   Financial Resource Strain: Not on file  Food Insecurity: Not on file  Transportation Needs: Not on file  Physical Activity: Not on file  Stress: Not on file  Social Connections: Not on file  Intimate Partner Violence: Not on file    Allergies  Allergen Reactions   Prednisone Rash   Sulfa Antibiotics Other (See Comments)    Hyper     Current Outpatient Medications  Medication Sig Dispense Refill   ACCU-CHEK AVIVA PLUS test strip      acetaminophen (TYLENOL) 650 MG CR tablet Take 650 mg by mouth every 8 (eight) hours as needed for pain.     aspirin EC 81 MG tablet Take 81 mg by mouth in the morning. Swallow whole.     atorvastatin (LIPITOR) 40 MG tablet TAKE 1 TABLET (40 MG TOTAL) BY MOUTH IN THE MORNING. 90 tablet 3   carboxymethylcellul-glycerin (REFRESH OPTIVE) 0.5-0.9 % ophthalmic solution Place 2 drops into both eyes daily.     clopidogrel (PLAVIX) 75 MG tablet TAKE 1 TABLET (75 MG TOTAL) BY MOUTH IN THE MORNING. 90 tablet 1   FLOVENT HFA 110 MCG/ACT inhaler Inhale 2 puffs into the lungs 2 (two) times daily.  3   fluticasone (FLONASE) 50 MCG/ACT nasal spray Place 2 sprays into the nose in the morning.     furosemide (LASIX) 20 MG tablet Take 1 tablet (20 mg total) by mouth in the morning. 90 tablet 3   hydrALAZINE (APRESOLINE) 10 MG tablet Take 1 tablet (10 mg total) by mouth in the morning and at bedtime. 180 tablet 3   ipratropium-albuterol (DUONEB) 0.5-2.5 (3) MG/3ML SOLN Take 3 mLs by nebulization every 6 (six) hours as needed (wheezing/shortness of breath).     isosorbide mononitrate (IMDUR) 30 MG 24 hr tablet Take 1 tablet (30 mg total) by mouth daily. 90 tablet 3   metoprolol (TOPROL-XL) 200 MG 24 hr tablet Take 0.5 tablets (100 mg total) by mouth in the  morning and at bedtime. 90 tablet 3   mirtazapine (REMERON) 30 MG tablet TAKE 1 TABLET BY MOUTH EVERY DAY 90 tablet 1   montelukast (SINGULAIR) 10 MG tablet Take 1 tablet (10 mg total) by mouth daily. 90 tablet 1   mupirocin ointment (BACTROBAN) 2 % Apply topically daily.     pantoprazole (PROTONIX) 40 MG tablet Take 40 mg by mouth 2 (two) times daily. Morning & Evening     Polyethyl Glycol-Propyl Glycol (SYSTANE OP) Place 1 drop into  both eyes in the morning and at bedtime.     SYNTHROID 200 MCG tablet TAKE 1 TABLET (200 MCG TOTAL) BY MOUTH IN THE MORNING. 90 tablet 0   vitamin B-12 (CYANOCOBALAMIN) 1000 MCG tablet Take 1,000 mcg by mouth in the morning.     No current facility-administered medications for this visit.    PHYSICAL EXAM Vitals:   11/14/21 1123  BP: (!) 171/72  Pulse: (!) 49  Resp: 20  Temp: 98.3 F (36.8 C)  SpO2: 95%  Weight: 159 lb (72.1 kg)  Height: '5\' 8"'$  (1.727 m)    Constitutional: Chronically ill-appearing elderly gentleman in no acute distress Cardiac: Regular rate and rhythm.  Respiratory:  unlabored. Abdominal:  soft, non-tender, non-distended.  Peripheral vascular: No palpable pedal pulses.  Deep pretibial ulceration in the right lower extremity.   PERTINENT LABORATORY AND RADIOLOGIC DATA  Most recent CBC    Latest Ref Rng & Units 04/09/2021    3:54 PM 02/06/2021    1:05 PM 11/08/2020   10:37 AM  CBC  WBC 4.0 - 10.5 K/uL 8.5  6.3  6.3   Hemoglobin 13.0 - 17.0 g/dL 10.3  10.1  10.3   Hematocrit 39.0 - 52.0 % 31.5  31.3  30.6   Platelets 150.0 - 400.0 K/uL 211.0  214  246      Most recent CMP    Latest Ref Rng & Units 11/10/2021    3:40 PM 09/08/2021    3:38 PM 04/30/2021    1:04 PM  CMP  Glucose 70 - 99 mg/dL 104  93    BUN 8 - 27 mg/dL 27  35    Creatinine 0.76 - 1.27 mg/dL 1.45  1.43    Sodium 134 - 144 mmol/L 140  141    Potassium 3.5 - 5.2 mmol/L 4.8  5.2    Chloride 96 - 106 mmol/L 105  108    CO2 20 - 29 mmol/L 23  21    Calcium  8.6 - 10.2 mg/dL 9.2  9.3    Total Protein 6.0 - 8.5 g/dL   7.2   Total Bilirubin 0.0 - 1.2 mg/dL   0.6   Alkaline Phos 44 - 121 IU/L   48   AST 0 - 40 IU/L   21   ALT 0 - 44 IU/L   10     Renal function Estimated Creatinine Clearance: 36 mL/min (A) (by C-G formula based on SCr of 1.45 mg/dL (H)).  No results found for: "HGBA1C"  LDL Chol Calc (NIH)  Date Value Ref Range Status  04/30/2021 56 0 - 99 mg/dL Final     Vascular Imaging: +-------+-----------+-----------+----------------------------+------------+   ABI/TBIToday's ABIToday's TBIPrevious ABI                Previous  TBI  +-------+-----------+-----------+----------------------------+------------+   Right  [place]         0.19       0.55                        0.38            +-------+-----------+-----------+----------------------------+------------+   Left   [place]         0.42       unable to obtain due to pain0.43            +-------+-----------+-----------+----------------------------+------------+   Yevonne Aline. Stanford Breed, MD [facility] Phone Number: [phone] 11/13/2021 12:30 PM  Total time spent on preparing this encounter including chart review, data review, collecting history, examining the patient, coordinating care for this new patient, 60 minutes.  Portions of this report may have been transcribed using voice recognition software.  Every effort has been made to ensure accuracy; however, inadvertent computerized transcription errors may still be present.

## 2021-11-14 ENCOUNTER — Ambulatory Visit: Payer: Medicare Other | Admitting: Vascular Surgery

## 2021-11-14 ENCOUNTER — Other Ambulatory Visit: Payer: Self-pay

## 2021-11-14 ENCOUNTER — Encounter: Payer: Self-pay | Admitting: Vascular Surgery

## 2021-11-14 VITALS — BP 171/72 | HR 49 | Temp 98.3°F | Resp 20 | Ht 68.0 in | Wt 159.0 lb

## 2021-11-14 DIAGNOSIS — I739 Peripheral vascular disease, unspecified: Secondary | ICD-10-CM

## 2021-11-17 ENCOUNTER — Encounter (HOSPITAL_BASED_OUTPATIENT_CLINIC_OR_DEPARTMENT_OTHER): Payer: Medicare Other | Admitting: Internal Medicine

## 2021-11-17 DIAGNOSIS — Z95820 Peripheral vascular angioplasty status with implants and grafts: Secondary | ICD-10-CM | POA: Diagnosis not present

## 2021-11-17 DIAGNOSIS — I255 Ischemic cardiomyopathy: Secondary | ICD-10-CM | POA: Diagnosis not present

## 2021-11-17 DIAGNOSIS — S8011XA Contusion of right lower leg, initial encounter: Secondary | ICD-10-CM | POA: Diagnosis not present

## 2021-11-17 DIAGNOSIS — Z950 Presence of cardiac pacemaker: Secondary | ICD-10-CM | POA: Diagnosis not present

## 2021-11-17 DIAGNOSIS — L97818 Non-pressure chronic ulcer of other part of right lower leg with other specified severity: Secondary | ICD-10-CM | POA: Diagnosis not present

## 2021-11-17 DIAGNOSIS — Z87891 Personal history of nicotine dependence: Secondary | ICD-10-CM | POA: Diagnosis not present

## 2021-11-17 DIAGNOSIS — X58XXXA Exposure to other specified factors, initial encounter: Secondary | ICD-10-CM | POA: Diagnosis not present

## 2021-11-17 DIAGNOSIS — I87311 Chronic venous hypertension (idiopathic) with ulcer of right lower extremity: Secondary | ICD-10-CM

## 2021-11-17 DIAGNOSIS — I2510 Atherosclerotic heart disease of native coronary artery without angina pectoris: Secondary | ICD-10-CM | POA: Diagnosis not present

## 2021-11-17 DIAGNOSIS — S8011XD Contusion of right lower leg, subsequent encounter: Secondary | ICD-10-CM

## 2021-11-17 DIAGNOSIS — I70238 Atherosclerosis of native arteries of right leg with ulceration of other part of lower right leg: Secondary | ICD-10-CM

## 2021-11-17 NOTE — Progress Notes (Signed)
[name], [name] ([id number]) Visit Report for 11/17/2021 Arrival Information Details Patient Name: Date of Service: ATREYU, MAK 11/17/2021 3:00 PM Medical Record Number: [id number] Patient Account Number: [id number] Date of Birth/Sex: Treating RN: 12-Nov-1933 (88 y.o. [name], Meta.Reding Primary Care Iyonnah Ferrante: [name] Other Clinician: Referring Khamya Topp: Treating Najwa Spillane/Extender: [name] in Treatment: 7 Visit Information History Since Last Visit Added or deleted any medications: No Patient Arrived: [name] Any new allergies or adverse reactions: No Arrival Time: 15:18 Had a fall or experienced change in No Accompanied By: daughter activities of daily living that may affect Transfer Assistance: None risk of falls: Patient Identification Verified: Yes Signs or symptoms of abuse/neglect since last visito No Secondary Verification Process Completed: Yes Hospitalized since last visit: No Patient Requires Transmission-Based Precautions: No Implantable device outside of the clinic excluding No Patient Has Alerts: Yes cellular tissue based products placed in the center Patient Alerts: [id number] to obtain;pain since last visit: Has Dressing in Place as Prescribed: Yes Pain Present Now: No Notes angiogram on Wednesday per daughter with [facility]. Electronic Signature(s) Signed: 11/17/2021 5:44:49 PM By: Deon Pilling RN, BSN Entered By: Deon Pilling on 11/17/2021 15:19:16 -------------------------------------------------------------------------------- Clinic Level of Care Assessment Details Patient Name: Date of Service: DMAURI, ROSENOW. 11/17/2021 3:00 PM Medical Record Number: [id number] Patient Account Number: [id number] Date of Birth/Sex: Treating RN: 31-Oct-1933 (88 y.o. [name], [name] Primary Care Melis Trochez: [name] Other Clinician: Referring Dametra Whetsel: Treating Ellah Otte/Extender: [name] in Treatment: 7 Clinic  Level of Care Assessment Items TOOL 4 Quantity Score X- 1 0 Use when only an EandM is performed on FOLLOW-UP visit ASSESSMENTS - Nursing Assessment / Reassessment X- 1 10 Reassessment of Co-morbidities (includes updates in patient status) X- 1 5 Reassessment of Adherence to Treatment Plan ASSESSMENTS - Wound and Skin A ssessment / Reassessment X - Simple Wound Assessment / Reassessment - one wound 1 5 '[]'$  - 0 Complex Wound Assessment / Reassessment - multiple wounds X- 1 10 Dermatologic / Skin Assessment (not related to wound area) ASSESSMENTS - Focused Assessment X- 1 5 Circumferential Edema Measurements - multi extremities X- 1 10 Nutritional Assessment / Counseling / Intervention '[]'$  - 0 Lower Extremity Assessment (monofilament, tuning fork, pulses) '[]'$  - 0 Peripheral Arterial Disease Assessment (using hand held doppler) ASSESSMENTS - Ostomy and/or Continence Assessment and Care '[]'$  - 0 Incontinence Assessment and Management '[]'$  - 0 Ostomy Care Assessment and Management (repouching, etc.) PROCESS - Coordination of Care X - Simple Patient / Family Education for ongoing care 1 15 '[]'$  - 0 Complex (extensive) Patient / Family Education for ongoing care X- 1 10 Staff obtains Programmer, systems, Records, T Results / Process Orders est '[]'$  - 0 Staff telephones HHA, Nursing Homes / Clarify orders / etc '[]'$  - 0 Routine Transfer to another Facility (non-emergent condition) '[]'$  - 0 Routine Hospital Admission (non-emergent condition) '[]'$  - 0 New Admissions / Biomedical engineer / Ordering NPWT Apligraf, etc. , '[]'$  - 0 Emergency Hospital Admission (emergent condition) X- 1 10 Simple Discharge Coordination '[]'$  - 0 Complex (extensive) Discharge Coordination PROCESS - Special Needs '[]'$  - 0 Pediatric / Minor Patient Management '[]'$  - 0 Isolation Patient Management '[]'$  - 0 Hearing / Language / Visual special needs '[]'$  - 0 Assessment of Community assistance (transportation, D/C planning,  etc.) '[]'$  - 0 Additional assistance / Altered mentation '[]'$  - 0 Support Surface(s) Assessment (bed, cushion, seat, etc.) INTERVENTIONS - Wound Cleansing / Measurement X - Simple Wound Cleansing - one wound  1 5 '[]'$  - 0 Complex Wound Cleansing - multiple wounds X- 1 5 Wound Imaging (photographs - any number of wounds) '[]'$  - 0 Wound Tracing (instead of photographs) X- 1 5 Simple Wound Measurement - one wound '[]'$  - 0 Complex Wound Measurement - multiple wounds INTERVENTIONS - Wound Dressings X - Small Wound Dressing one or multiple wounds 1 10 '[]'$  - 0 Medium Wound Dressing one or multiple wounds '[]'$  - 0 Large Wound Dressing one or multiple wounds '[]'$  - 0 Application of Medications - topical '[]'$  - 0 Application of Medications - injection INTERVENTIONS - Miscellaneous '[]'$  - 0 External ear exam '[]'$  - 0 Specimen Collection (cultures, biopsies, blood, body fluids, etc.) '[]'$  - 0 Specimen(s) / Culture(s) sent or taken to Lab for analysis '[]'$  - 0 Patient Transfer (multiple staff / Civil Service fast streamer / Similar devices) '[]'$  - 0 Simple Staple / Suture removal (25 or less) '[]'$  - 0 Complex Staple / Suture removal (26 or more) '[]'$  - 0 Hypo / Hyperglycemic Management (close monitor of Blood Glucose) '[]'$  - 0 Ankle / Brachial Index (ABI) - do not check if billed separately X- 1 5 Vital Signs Has the patient been seen at the hospital within the last three years: Yes Total Score: 110 Level Of Care: New/Established - Level 3 Electronic Signature(s) Signed: 11/17/2021 5:44:49 PM By: Deon Pilling RN, BSN Entered By: Deon Pilling on 11/17/2021 15:38:54 -------------------------------------------------------------------------------- Encounter Discharge Information Details Patient Name: Date of Service: [name] 11/17/2021 3:00 PM Medical Record Number: [id number] Patient Account Number: [id number] Date of Birth/Sex: Treating RN: April 09, 1934 (88 y.o. [name] Primary Care Maurene Hollin: [name]  Other Clinician: Referring Jaelyn Cloninger: Treating Deborah Lazcano/Extender: [name] in Treatment: 7 Encounter Discharge Information Items Discharge Condition: Stable Ambulatory Status: Ambulatory Discharge Destination: Home Transportation: Private Auto Accompanied By: daughter Schedule Follow-up Appointment: Yes Clinical Summary of Care: Electronic Signature(s) Signed: 11/17/2021 5:44:49 PM By: Deon Pilling RN, BSN Entered By: Deon Pilling on 11/17/2021 15:39:13 -------------------------------------------------------------------------------- Lower Extremity Assessment Details Patient Name: Date of Service: [name] 11/17/2021 3:00 PM Medical Record Number: [id number] Patient Account Number: [id number] Date of Birth/Sex: Treating RN: 11/01/1933 (88 y.o. [name] Primary Care Takeyla Million: [name] Other Clinician: Referring Elanie Hammitt: Treating Markeeta Scalf/Extender: [name] in Treatment: 7 Edema Assessment Assessed: [name]: No] [name]: Yes] Edema: [Left: Ye] [Right: s] Calf Left: Right: Point of Measurement: 31 cm From Medial Instep 32 cm Ankle Left: Right: Point of Measurement: 7 cm From Medial Instep 26 cm Vascular Assessment Pulses: Dorsalis Pedis Palpable: [Right:Yes] Electronic Signature(s) Signed: 11/17/2021 5:44:49 PM By: Deon Pilling RN, BSN Entered By: Deon Pilling on 11/17/2021 15:20:55 -------------------------------------------------------------------------------- Multi Wound Chart Details Patient Name: Date of Service: [name] 11/17/2021 3:00 PM Medical Record Number: [id number] Patient Account Number: [id number] Date of Birth/Sex: Treating RN: [birth year] (88 y.o. 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 Secondary Etiology: Extremity Cataracts, Anemia, Asthma, Chronic N/A N/A Comorbid History: Obstructive Pulmonary Disease (COPD), Congestive Heart Failure, Coronary Artery Disease, Hypertension, Myocardial Infarction, Peripheral Arterial Disease, Type II Diabetes 07/02/2021 N/A N/A Date Acquired: 7 N/A N/A Weeks of  Treatment: Open N/A N/A Wound Status: No N/A N/A Wound Recurrence: 2.6x2.5x0.3 N/A N/A Measurements L x W x D (cm) 5.105 N/A N/A A (cm) : rea 1.532 N/A N/A Volume (cm) : -41.60% N/A N/A % Reduction in Area: -6.20% N/A N/A % Reduction in Volume: Full Thickness Without Exposed N/A N/A Classification: Support Structures Medium N/A N/A Exudate Amount: Serosanguineous N/A N/A Exudate Type: red, brown N/A N/A Exudate Color: Epibole N/A N/A Wound Margin: Large (67-100%) N/A N/A Granulation Amount: Red, Pink N/A N/A Granulation Quality: Small (1-33%) N/A N/A Necrotic Amount: Fat Layer (Subcutaneous Tissue): Yes N/A N/A Exposed Structures: Fascia: No Tendon: No Muscle: No Joint: No Bone: No Small (1-33%) N/A N/A Epithelialization: Treatment Notes Wound #1 (Lower Leg) Wound Laterality: Right, Anterior Cleanser Soap and Water Discharge Instruction: May shower and wash wound with dial antibacterial soap and water prior to dressing change. Wound Cleanser Discharge Instruction: Cleanse the wound with wound cleanser prior to applying a clean dressing using gauze sponges, not tissue or cotton balls. Peri-Wound Care Skin Prep Discharge Instruction: Use skin prep as directed Sween Lotion (Moisturizing lotion) Discharge Instruction: Apply  moisturizing lotion as directed Topical Keystone compounding topical antibiotics Discharge Instruction: apply daily directly to wound bed. Primary Dressing Hydrofera Blue Ready Foam, 4x5 in Discharge Instruction: Apply to wound bed as instructed Secondary Dressing Zetuvit Plus Silicone Border Dressing 4x4 (in/in) Discharge Instruction: Apply silicone border over primary dressing as directed. Secured With Compression Wrap Compression Stockings Environmental education officer) Signed: 11/17/2021 4:01:15 PM By: Kalman Shan DO Signed: 11/17/2021 5:44:49 PM By: Deon Pilling RN, BSN Entered By: Kalman Shan on 11/17/2021 15:46:46 -------------------------------------------------------------------------------- Multi-Disciplinary Care Plan Details Patient Name: Date of Service: [name] 11/17/2021 3:00 PM Medical Record Number: [id number] Patient Account Number: [id number] Date of Birth/Sex: Treating RN: 1933/07/07 (88 y.o. [name] Primary Care Marlowe Cinquemani: [name] Other Clinician: Referring Sincere Liuzzi: Treating Brenley Priore/Extender: [name] in Treatment: 7 Active Inactive Tissue Oxygenation Nursing Diagnoses: Potential alteration in peripheral tissue perfusion (select prior to confirmation of diagnosis) Goals: Non-invasive arterial studies are completed as ordered Date Initiated: 10/14/2021 Target Resolution Date: 11/28/2021 Goal Status: Active Interventions: Assess patient understanding of disease process and management upon diagnosis and as needed Assess peripheral arterial status upon admission and as needed Provide education on tissue oxygenation and ischemia Treatment Activities: Ankle Brachial Index (ABI) : 09/30/2021 Non-invasive vascular studies : 10/14/2021 Notes: Wound/Skin Impairment Nursing Diagnoses: Knowledge deficit related to ulceration/compromised skin integrity Goals: Patient/caregiver will verbalize  understanding of skin care regimen Date Initiated: 09/26/2021 Target Resolution Date: 11/28/2021 Goal Status: Active Interventions: Assess patient/caregiver ability to perform ulcer/skin care regimen upon admission and as needed Assess ulceration(s) every visit Provide education on ulcer and skin care Treatment Activities: Skin care regimen initiated : 09/26/2021 Topical wound management initiated : 09/26/2021 Notes: 10/21/21: Wound care regimen continues, using [employer] and [employer]. Electronic Signature(s) Signed: 11/17/2021 5:44:49 PM By: Deon Pilling RN, BSN Entered By: Deon Pilling on 11/17/2021 15:36:04 -------------------------------------------------------------------------------- Pain Assessment Details Patient Name: Date of Service: [name] 11/17/2021 3:00 PM Medical Record Number: [id number] Patient Account Number: [id number] Date of Birth/Sex: Treating RN: Mar 07, 1934 (88 y.o. [name] Primary Care Rindi Beechy: [name] Other Clinician: Referring Shaterra Sanzone: Treating Ger Ringenberg/Extender: [name] in Treatment: 7 Active Problems Location of Pain Severity and Description of Pain Patient Has Paino No Site Locations Rate the pain. Rate the pain. Current Pain Level: 0 Pain Management and Medication Current Pain Management: Medication: No  Cold Application: No Rest: No Massage: No Activity: No T.E.N.S.: No Heat Application: No Leg drop or elevation: No Is the Current Pain Management Adequate: Adequate How does your wound impact your activities of daily livingo Sleep: No Bathing: No Appetite: No Relationship With Others: No Bladder Continence: No Emotions: No Bowel Continence: No Work: No Toileting: No Drive: No Dressing: No Hobbies: No Engineer, maintenance) Signed: 11/17/2021 5:44:49 PM By: Deon Pilling RN, BSN Entered By: Deon Pilling on 11/17/2021  15:19:34 -------------------------------------------------------------------------------- Patient/Caregiver Education Details Patient Name: Date of Service: Davilla, Danell W. 6/19/2023andnbsp3:00 PM Medical Record Number: [id number] Patient Account Number: [id number] Date of Birth/Gender: Treating RN: 06/11/1933 (88 y.o. [name] Primary Care Physician: [name] Other Clinician: Referring Physician: Treating Physician/Extender: [name] in Treatment: 7 Education Assessment Education Provided To: Patient Education Topics Provided Wound/Skin Impairment: Handouts: Skin Care Do's and Dont's Methods: Explain/Verbal Responses: Reinforcements needed Electronic Signature(s) Signed: 11/17/2021 5:44:49 PM By: Deon Pilling RN, BSN Entered By: Deon Pilling on 11/17/2021 15:36:15 -------------------------------------------------------------------------------- Wound Assessment Details Patient Name: Date of Service: [name] 11/17/2021 3:00 PM Medical Record Number: [id number] Patient Account Number: [id number] Date of Birth/Sex: Treating RN: December 19, 1933 (88 y.o. [name], Meta.Reding Primary Care Jovaughn Wojtaszek: [name] Other Clinician: Referring Dmya Long: Treating Carolos Fecher/Extender: [name] in Treatment: 7 Wound Status Wound Number: 1 Primary Abrasion Etiology: Wound Location: Right, Anterior Lower Leg Secondary Diabetic Wound/Ulcer of the Lower Extremity Wounding Event: Trauma Etiology: Date Acquired: 07/02/2021 Wound Open Weeks Of Treatment: 7 Status: Clustered Wound: No Comorbid Cataracts, Anemia, Asthma, Chronic Obstructive Pulmonary History: Disease (COPD), Congestive Heart Failure, Coronary Artery Disease, Hypertension, Myocardial Infarction, Peripheral Arterial Disease, Type II Diabetes Photos Wound Measurements Length: (cm) 2.6 Width: (cm) 2.5 Depth: (cm) 0.3 Area: (cm) 5.105 Volume: (cm)  1.532 % Reduction in Area: -41.6% % Reduction in Volume: -6.2% Epithelialization: Small (1-33%) Tunneling: No Undermining: No Wound Description Classification: Full Thickness Without Exposed Support Structures Wound Margin: Epibole Exudate Amount: Medium Exudate Type: Serosanguineous Exudate Color: red, brown Foul Odor After Cleansing: No Slough/Fibrino Yes Wound Bed Granulation Amount: Large (67-100%) Exposed Structure Granulation Quality: Red, Pink Fascia Exposed: No Necrotic Amount: Small (1-33%) Fat Layer (Subcutaneous Tissue) Exposed: Yes Necrotic Quality: Adherent Slough Tendon Exposed: No Muscle Exposed: No Joint Exposed: No Bone Exposed: No Treatment Notes Wound #1 (Lower Leg) Wound Laterality: Right, Anterior Cleanser Soap and Water Discharge Instruction: May shower and wash wound with dial antibacterial soap and water prior to dressing change. Wound Cleanser Discharge Instruction: Cleanse the wound with wound cleanser prior to applying a clean dressing using gauze sponges, not tissue or cotton balls. Peri-Wound Care Skin Prep Discharge Instruction: Use skin prep as directed Sween Lotion (Moisturizing lotion) Discharge Instruction: Apply moisturizing lotion as directed Topical Keystone compounding topical antibiotics Discharge Instruction: apply daily directly to wound bed. Primary Dressing Hydrofera Blue Ready Foam, 4x5 in Discharge Instruction: Apply to wound bed as instructed Secondary Dressing Zetuvit Plus Silicone Border Dressing 4x4 (in/in) Discharge Instruction: Apply silicone border over primary dressing as directed. Secured With Compression Wrap Compression Stockings Environmental education officer) Signed: 11/17/2021 5:44:49 PM By: Deon Pilling RN, BSN Entered By: Deon Pilling on 11/17/2021 15:22:49 -------------------------------------------------------------------------------- Vitals Details Patient Name: Date of Service: [name]  11/17/2021 3:00 PM Medical Record Number: [id number] Patient Account Number: [id number] Date of Birth/Sex: Treating RN: 12/29/1933 (88 y.o. [name] Primary Care Jamiah Recore: [name] Other Clinician: Referring Lieutenant Abarca: Treating Denajah Farias/Extender: [name] in Treatment: 7  Vital Signs Time Taken: 15:15 Temperature (F): 98.5 Height (in): 69 Pulse (bpm): 73 Weight (lbs): 168 Respiratory Rate (breaths/min): 20 Body Mass Index (BMI): 24.8 Blood Pressure (mmHg): 148/68 Reference Range: 80 - 120 mg / dl Electronic Signature(s) Signed: 11/17/2021 5:44:49 PM By: Deon Pilling RN, BSN Entered By: Deon Pilling on 11/17/2021 15:19:27

## 2021-11-18 NOTE — Progress Notes (Signed)
BRODEY, BONN ([id number]) Visit Report for 11/17/2021 Chief Complaint Document Details Patient Name: Date of Service: [name], [name] 11/17/2021 3:00 PM Medical Record Number: [id number] Patient Account Number: [id number] Date of Birth/Sex: Treating RN: [birth year] (88 y.o. [name] Primary Care Provider: [name] Other Clinician: Referring Provider: Treating Provider/Extender: [name] in Treatment: 7 Information Obtained from: Patient Chief Complaint 09/26/2021; patient is here for a review of a complicated wound on the right anterior lower leg which was initially secondary to trauma Electronic Signature(s) Signed: 11/17/2021 4:01:15 PM By: Kalman Shan DO Entered By: Kalman Shan on 11/17/2021 15:46:58 -------------------------------------------------------------------------------- HPI Details Patient Name: Date of Service: [name]. 11/17/2021 3:00 PM Medical Record Number: [id number] Patient Account Number: [id number] Date of Birth/Sex: Treating RN: [birth year] (88 y.o. [name] Primary Care Provider: [name] Other Clinician: Referring Provider: Treating Provider/Extender: [name] in Treatment: 7 History of Present Illness HPI Description: ADMISSION 09/26/21 This is a 88 year old man who lives near [place]. He is accompanied by his daughter who lives with him. His wife is also at home. Apparently 2 months ago he traumatized the right anterior lower leg x2 in quick succession including once on the back of a pickup truck or car. By description it sounds as though he had a black eschar on this which fell off about a month ago. He has been left with an open wound. It this is not that large but is completely slough covered and has undermining of at least a centimeter and half from 12-6 o'clock. He received a course of antibiotics earlier this month from his primary care doctor Ceftin.  He is not on antibiotics currently. The patient has a complicated relevant history. 2019 he underwent bilateral common iliac artery stenting and angioplasties by Dr. Carlis Abbott. He had ABIs at the same time which showed a ABI at 0.55 on the right and a TBI of 0.38. He had a TBI of 0.43 on the left they could not do an ABI because he could not tolerate the pressure cuff. He has not followed up. The patient is apparently a diabetic although he has not been on any treatment in years and its not clear that he is even followed up. Other past medical history includes coronary artery disease, ischemic cardiomyopathy status post pacemaker, he is on Plavix, He did not cooperate with our attempt to do ABIs on the right leg in our clinic 5/5; patient presents for follow-up. Last week he was started on Iodosorb under Kerlix/Coban. He has no issues or complaints today. He has his venous and arterial studies scheduled for next week. He currently denies signs of infection. 5/16; patient presents for follow-up. Last week he was started on Hydrofera Blue and gentamicin and mupirocin ointment under Kerlix/Coban. He tolerated this well. He had a PCR culture that reported high levels of Streptococcus agalactiae, Pseudomonas aeruginosa and Enterococcus bacillus. Keystone antibiotics were ordered and he has this however did not bring it today. He had ABIs with TBI's and venous reflux studies. He had evidence of reflux throughout the right and left venous system. His ABIs on the right was noncompressible with a TBI of 0.19. He currently denies systemic signs of infection. 5/23; patient presents for follow-up. He has been using Keystone antibiotic to the wound bed without issues. He currently denies signs of infection. He has not heard back from Dr. Kennon Holter office. 6/8; patient is back his wound is somewhat better with less undermining but still  the same basic surface area. The wound surface looks better in terms of nonviable  material. But the other issue that concerns me is this patient probably has some minimal distance claudication and may even have claudication at rest at night in the right leg. Referencing my notes from his admission he has had previous bilateral common iliac artery stenting and angioplasties in 2019. For some reason I do not know that he ever had any follow-up. 6/19; patient presents for follow-up. He saw Dr. Luan Pulling on 6/16 that recommended a CT angiogram to evaluate iliac stents and right common femoral disease. Patient is using Keystone antibiotic with [facility] without issues. He denies signs of infection. Electronic Signature(s) Signed: 11/17/2021 4:01:15 PM By: Kalman Shan DO Entered By: Kalman Shan on 11/17/2021 15:55:52 -------------------------------------------------------------------------------- Physical Exam Details Patient Name: Date of Service: [name]. 11/17/2021 3:00 PM Medical Record Number: [id number] Patient Account Number: [id number] Date of Birth/Sex: Treating RN: 1933/09/17 (88 y.o. [name] Primary Care Provider: [name] Other Clinician: Referring Provider: Treating Provider/Extender: [name] in Treatment: 7 Constitutional respirations regular, non-labored and within target range for patient.Marland Kitchen Psychiatric pleasant and cooperative. Notes Right lower extremity: T the mid tibial aspect there is an open wound with tightly adhered nonviable tissue also with granulation tissue present. No surrounding o signs of infection. Evidence of chronic venous insufficiency. Electronic Signature(s) Signed: 11/17/2021 4:01:15 PM By: Kalman Shan DO Entered By: Kalman Shan on 11/17/2021 15:56:46 -------------------------------------------------------------------------------- Physician Orders Details Patient Name: Date of Service: [name]. 11/17/2021 3:00 PM Medical Record Number: [id number] Patient  Account Number: [id number] Date of Birth/Sex: Treating RN: 08-29-1933 (88 y.o. [name], [name] Primary Care Provider: [name] Other Clinician: Referring Provider: Treating Provider/Extender: [name] in Treatment: 7 Verbal / Phone Orders: No Diagnosis Coding ICD-10 Coding Code Description S80.11XD Contusion of right lower leg, subsequent encounter L97.818 Non-pressure chronic ulcer of other part of right lower leg with other specified severity I87.311 Chronic venous hypertension (idiopathic) with ulcer of right lower extremity I70.238 Atherosclerosis of native arteries of right leg with ulceration of other part of lower leg Follow-up Appointments ppointment in 1 week. - Dr. Heber [place] and [place], Room 8 11/24/2021 3pm Return A Other: - Vein and Vascular CT Angiogram 11/19/2021. Bathing/ Shower/ Hygiene May shower with protection but do not get wound dressing(s) wet. Edema Control - Lymphedema / SCD / Other Elevate legs to the level of the heart or above for 30 minutes daily and/or when sitting, a frequency of: - 3-4 times a day throughout the day. Avoid standing for long periods of time. Exercise regularly Wound Treatment Wound #1 - Lower Leg Wound Laterality: Right, Anterior Cleanser: Soap and Water 1 x Per Day/30 Days Discharge Instructions: May shower and wash wound with dial antibacterial soap and water prior to dressing change. Cleanser: Wound Cleanser (Generic) 1 x Per Day/30 Days Discharge Instructions: Cleanse the wound with wound cleanser prior to applying a clean dressing using gauze sponges, not tissue or cotton balls. Peri-Wound Care: Skin Prep (Generic) 1 x Per Day/30 Days Discharge Instructions: Use skin prep as directed Peri-Wound Care: Sween Lotion (Moisturizing lotion) 1 x Per Day/30 Days Discharge Instructions: Apply moisturizing lotion as directed Topical: Keystone compounding topical antibiotics 1 x Per Day/30 Days Discharge  Instructions: apply daily directly to wound bed. Prim Dressing: Hydrofera Blue Ready Foam, 4x5 in 1 x Per Day/30 Days ary Discharge Instructions: Apply to wound bed as instructed Secondary Dressing: Zetuvit Plus  Silicone Border Dressing 4x4 (in/in) (Generic) 1 x Per Day/30 Days Discharge Instructions: Apply silicone border over primary dressing as directed. Electronic Signature(s) Signed: 11/17/2021 4:01:15 PM By: Kalman Shan DO Entered By: Kalman Shan on 11/17/2021 15:56:55 -------------------------------------------------------------------------------- Problem List Details Patient Name: Date of Service: [name]. 11/17/2021 3:00 PM Medical Record Number: [id number] Patient Account Number: [id number] Date of Birth/Sex: Treating RN: 09-13-1933 (88 y.o. [name], Meta.Reding Primary Care Provider: [name] Other Clinician: Referring Provider: Treating Provider/Extender: [name] in Treatment: 7 Active Problems ICD-10 Encounter Code Description Active Date MDM Diagnosis S80.11XD Contusion of right lower leg, subsequent encounter 09/26/2021 No Yes L97.818 Non-pressure chronic ulcer of other part of right lower leg with other specified 09/26/2021 No Yes severity I87.311 Chronic venous hypertension (idiopathic) with ulcer of right lower extremity 09/26/2021 No Yes I70.238 Atherosclerosis of native arteries of right leg with ulceration of other part of 10/14/2021 No Yes lower leg Inactive Problems Resolved Problems Electronic Signature(s) Signed: 11/17/2021 4:01:15 PM By: Kalman Shan DO Entered By: Kalman Shan on 11/17/2021 15:46:41 -------------------------------------------------------------------------------- Progress Note Details Patient Name: Date of Service: [name]. 11/17/2021 3:00 PM Medical Record Number: [id number] Patient Account Number: [id number] Date of Birth/Sex: Treating RN: Apr 15, 1934 (88 y.o. [name] Primary Care Provider: [name] Other Clinician: Referring Provider: Treating Provider/Extender: [name] in Treatment: 7 Subjective Chief Complaint Information obtained from Patient 09/26/2021; patient is here for a review of a complicated wound on the right anterior lower leg which was initially secondary to trauma History of Present Illness (HPI) ADMISSION 09/26/21 This is a 88 year old man who lives near [place]. He is accompanied by his daughter who lives with him. His wife is also at home. Apparently 2 months ago he traumatized the right anterior lower leg x2 in quick succession including once on the back of a pickup truck or car. By description it sounds as though he had a black eschar on this which fell off about a month ago. He has been left with an open wound. It this is not that large but is completely slough covered and has undermining of at least a centimeter and half from 12-6 o'clock. He received a course of antibiotics earlier this month from his primary care doctor Ceftin. He is not on antibiotics currently. The patient has a complicated relevant history. 2019 he underwent bilateral common iliac artery stenting and angioplasties by Dr. Carlis Abbott. He had ABIs at the same time which showed a ABI at 0.55 on the right and a TBI of 0.38. He had a TBI of 0.43 on the left they could not do an ABI because he could not tolerate the pressure cuff. He has not followed up. The patient is apparently a diabetic although he has not been on any treatment in years and its not clear that he is even followed up. Other past medical history includes coronary artery disease, ischemic cardiomyopathy status post pacemaker, he is on Plavix, He did not cooperate with our attempt to do ABIs on the right leg in our clinic 5/5; patient presents for follow-up. Last week he was started on Iodosorb under Kerlix/Coban. He has no issues or complaints today. He has his venous  and arterial studies scheduled for next week. He currently denies signs of infection. 5/16; patient presents for follow-up. Last week he was started on Hydrofera Blue and gentamicin and mupirocin ointment under Kerlix/Coban. He tolerated this well. He had a PCR culture that reported high levels  of Streptococcus agalactiae, Pseudomonas aeruginosa and Enterococcus bacillus. Keystone antibiotics were ordered and he has this however did not bring it today. He had ABIs with TBI's and venous reflux studies. He had evidence of reflux throughout the right and left venous system. His ABIs on the right was noncompressible with a TBI of 0.19. He currently denies systemic signs of infection. 5/23; patient presents for follow-up. He has been using Keystone antibiotic to the wound bed without issues. He currently denies signs of infection. He has not heard back from Dr. Kennon Holter office. 6/8; patient is back his wound is somewhat better with less undermining but still the same basic surface area. The wound surface looks better in terms of nonviable material. But the other issue that concerns me is this patient probably has some minimal distance claudication and may even have claudication at rest at night in the right leg. Referencing my notes from his admission he has had previous bilateral common iliac artery stenting and angioplasties in 2019. For some reason I do not know that he ever had any follow-up. 6/19; patient presents for follow-up. He saw Dr. Luan Pulling on 6/16 that recommended a CT angiogram to evaluate iliac stents and right common femoral disease. Patient is using Keystone antibiotic with [facility] without issues. He denies signs of infection. Patient History Information obtained from Patient, Chart. Family History Cancer - Siblings, Kidney Disease - Father. Social History Former smoker - quit 1985, Marital Status - Married, Alcohol Use - Never, Drug Use - No History, Caffeine Use -  Daily. Medical History Eyes Patient has history of Cataracts - Extraction 2018 Hematologic/Lymphatic Patient has history of Anemia Respiratory Patient has history of Asthma, Chronic Obstructive Pulmonary Disease (COPD) Cardiovascular Patient has history of Congestive Heart Failure, Coronary Artery Disease, Hypertension, Myocardial Infarction - 2009, Peripheral Arterial Disease Endocrine Patient has history of Type II Diabetes - Diet Controlled-does not take any medications Hospitalization/Surgery History - angioplasty stent- 2019 dr.Clark. - carotid stents. Medical A Surgical History Notes nd Eyes thyroid eye disease, hypertrophia of right eye, hypotropia of left eye, diplopia- corrected per daughter Cardiovascular Pacemaker/defibrillator Endocrine Thyroiditis, Hypothyroidism Objective Constitutional respirations regular, non-labored and within target range for patient.. Vitals Time Taken: 3:15 PM, Height: 69 in, Weight: 168 lbs, BMI: 24.8, Temperature: 98.5 F, Pulse: 73 bpm, Respiratory Rate: 20 breaths/min, Blood Pressure: 148/68 mmHg. Psychiatric pleasant and cooperative. General Notes: Right lower extremity: T the mid tibial aspect there is an open wound with tightly adhered nonviable tissue also with granulation tissue present. o No surrounding signs of infection. Evidence of chronic venous insufficiency. Integumentary (Hair, Skin) Wound #1 status is Open. Original cause of wound was Trauma. The date acquired was: 07/02/2021. The wound has been in treatment 7 weeks. The wound is located on the Right,Anterior Lower Leg. The wound measures 2.6cm length x 2.5cm width x 0.3cm depth; 5.105cm^2 area and 1.532cm^3 volume. There is Fat Layer (Subcutaneous Tissue) exposed. There is no tunneling or undermining noted. There is a medium amount of serosanguineous drainage noted. The wound margin is epibole. There is large (67-100%) red, pink granulation within the wound bed. There is a  small (1-33%) amount of necrotic tissue within the wound bed including Adherent Slough. Assessment Active Problems ICD-10 Contusion of right lower leg, subsequent encounter Non-pressure chronic ulcer of other part of right lower leg with other specified severity Chronic venous hypertension (idiopathic) with ulcer of right lower extremity Atherosclerosis of native arteries of right leg with ulceration of other part of lower leg  Patient's wound has shown slight improvement in size and appearance since last clinic visit. There is more granulation tissue present. He is planned to have his CT angiogram AO + BIFEM on 6/21. He states he has follow-up for results on 6/27. At that time they will assess next steps. For now we will be doing conservative wound care with [facility] antibiotics and Hydrofera Blue. Plan Follow-up Appointments: Return Appointment in 1 week. - Dr. Heber [place] and [place], Room 8 11/24/2021 3pm Other: - Vein and Vascular CT Angiogram 11/19/2021. Bathing/ Shower/ Hygiene: May shower with protection but do not get wound dressing(s) wet. Edema Control - Lymphedema / SCD / Other: Elevate legs to the level of the heart or above for 30 minutes daily and/or when sitting, a frequency of: - 3-4 times a day throughout the day. Avoid standing for long periods of time. Exercise regularly WOUND #1: - Lower Leg Wound Laterality: Right, Anterior Cleanser: Soap and Water 1 x Per Day/30 Days Discharge Instructions: May shower and wash wound with dial antibacterial soap and water prior to dressing change. Cleanser: Wound Cleanser (Generic) 1 x Per Day/30 Days Discharge Instructions: Cleanse the wound with wound cleanser prior to applying a clean dressing using gauze sponges, not tissue or cotton balls. Peri-Wound Care: Skin Prep (Generic) 1 x Per Day/30 Days Discharge Instructions: Use skin prep as directed Peri-Wound Care: Sween Lotion (Moisturizing lotion) 1 x Per Day/30 Days Discharge  Instructions: Apply moisturizing lotion as directed Topical: Keystone compounding topical antibiotics 1 x Per Day/30 Days Discharge Instructions: apply daily directly to wound bed. Prim Dressing: Hydrofera Blue Ready Foam, 4x5 in 1 x Per Day/30 Days ary Discharge Instructions: Apply to wound bed as instructed Secondary Dressing: Zetuvit Plus Silicone Border Dressing 4x4 (in/in) (Generic) 1 x Per Day/30 Days Discharge Instructions: Apply silicone border over primary dressing as directed. 1. Keystone antibiotic with Hydrofera Blue 2. Follow-up in 1 week Electronic Signature(s) Signed: 11/17/2021 4:01:15 PM By: Kalman Shan DO Entered By: Kalman Shan on 11/17/2021 16:00:15 -------------------------------------------------------------------------------- HxROS Details Patient Name: Date of Service: [name]. 11/17/2021 3:00 PM Medical Record Number: [id number] Patient Account Number: [id number] Date of Birth/Sex: Treating RN: July 21, 1933 (88 y.o. [name], Meta.Reding Primary Care Provider: [name] Other Clinician: Referring Provider: Treating Provider/Extender: [name] in Treatment: 7 Information Obtained From Patient Chart Eyes Medical History: Positive for: Cataracts - Extraction 2018 Past Medical History Notes: thyroid eye disease, hypertrophia of right eye, hypotropia of left eye, diplopia- corrected per daughter Hematologic/Lymphatic Medical History: Positive for: Anemia Respiratory Medical History: Positive for: Asthma; Chronic Obstructive Pulmonary Disease (COPD) Cardiovascular Medical History: Positive for: Congestive Heart Failure; Coronary Artery Disease; Hypertension; Myocardial Infarction - 2009; Peripheral Arterial Disease Past Medical History Notes: Pacemaker/defibrillator Endocrine Medical History: Positive for: Type II Diabetes - Diet Controlled-does not take any medications Past Medical History  Notes: Thyroiditis, Hypothyroidism Time with diabetes: 2019 Treated with: Diet Blood sugar tested every day: No HBO Extended History Items Eyes: Cataracts Immunizations Pneumococcal Vaccine: Received Pneumococcal Vaccination: No Implantable Devices Yes Hospitalization / Surgery History Type of Hospitalization/Surgery angioplasty stent- 2019 dr.Clark carotid stents Family and Social History Cancer: Yes - Siblings; Kidney Disease: Yes - Father; Former smoker - quit 1985; Marital Status - Married; Alcohol Use: Never; Drug Use: No History; Caffeine Use: Daily; Financial Concerns: No; Food, Clothing or Shelter Needs: No; Support System Lacking: No; Transportation Concerns: No Electronic Signature(s) Signed: 11/17/2021 4:01:15 PM By: Kalman Shan DO Signed: 11/17/2021 5:44:49 PM By: Deon Pilling RN,  BSN Entered By: Kalman Shan on 11/17/2021 15:55:59 -------------------------------------------------------------------------------- SuperBill Details Patient Name: Date of Service: [name], [name] 11/17/2021 Medical Record Number: [id number] Patient Account Number: [id number] Date of Birth/Sex: Treating RN: 13-Jun-1933 (88 y.o. [name], [name] Primary Care Provider: [name] Other Clinician: Referring Provider: Treating Provider/Extender: [name] in Treatment: 7 Diagnosis Coding ICD-10 Codes Code Description S80.11XD Contusion of right lower leg, subsequent encounter L97.818 Non-pressure chronic ulcer of other part of right lower leg with other specified severity I87.311 Chronic venous hypertension (idiopathic) with ulcer of right lower extremity I70.238 Atherosclerosis of native arteries of right leg with ulceration of other part of lower leg Facility Procedures CPT4 Code: [id number] Description: 99213 - WOUND CARE VISIT-LEV 3 EST PT Modifier: Quantity: 1 Physician Procedures : CPT4 Code Description Modifier [id number] - WC PHYS  LEVEL 3 - EST PT ICD-10 Diagnosis Description L97.818 Non-pressure chronic ulcer of other part of right lower leg with other specified severity I87.311 Chronic venous hypertension (idiopathic) with  ulcer of right lower extremity I70.238 Atherosclerosis of native arteries of right leg with ulceration of other part of lower leg S80.11XD Contusion of right lower leg, subsequent encounter Quantity: 1 Electronic Signature(s) Signed: 11/17/2021 5:44:49 PM By: Deon Pilling RN, BSN Signed: 11/18/2021 6:23:18 PM By: Kalman Shan DO Previous Signature: 11/17/2021 4:01:15 PM Version By: Kalman Shan DO Entered By: Deon Pilling on 11/17/2021 16:28:15

## 2021-11-19 ENCOUNTER — Ambulatory Visit (HOSPITAL_COMMUNITY)
Admission: RE | Admit: 2021-11-19 | Discharge: 2021-11-19 | Disposition: A | Discharge status: Home / Self Care | Payer: Medicare Other | Source: Ambulatory Visit | Attending: Vascular Surgery | Admitting: Vascular Surgery

## 2021-11-19 DIAGNOSIS — J90 Pleural effusion, not elsewhere classified: Secondary | ICD-10-CM | POA: Diagnosis not present

## 2021-11-19 DIAGNOSIS — N3289 Other specified disorders of bladder: Secondary | ICD-10-CM | POA: Diagnosis not present

## 2021-11-19 DIAGNOSIS — N1330 Unspecified hydronephrosis: Secondary | ICD-10-CM | POA: Diagnosis not present

## 2021-11-19 DIAGNOSIS — I739 Peripheral vascular disease, unspecified: Secondary | ICD-10-CM | POA: Diagnosis not present

## 2021-11-19 DIAGNOSIS — J9811 Atelectasis: Secondary | ICD-10-CM | POA: Diagnosis not present

## 2021-11-19 MED ORDER — IOHEXOL 350 MG/ML SOLN
100.0000 mL | Freq: Once | INTRAVENOUS | Status: AC | PRN
Start: 2021-11-19 — End: 2021-11-19
  Administered 2021-11-19: 100 mL via INTRAVENOUS

## 2021-11-21 ENCOUNTER — Ambulatory Visit (INDEPENDENT_AMBULATORY_CARE_PROVIDER_SITE_OTHER): Payer: Medicare Other

## 2021-11-21 ENCOUNTER — Telehealth: Payer: Self-pay

## 2021-11-21 DIAGNOSIS — I255 Ischemic cardiomyopathy: Secondary | ICD-10-CM

## 2021-11-24 ENCOUNTER — Encounter (HOSPITAL_BASED_OUTPATIENT_CLINIC_OR_DEPARTMENT_OTHER): Payer: Medicare Other | Admitting: Internal Medicine

## 2021-11-24 DIAGNOSIS — Z95820 Peripheral vascular angioplasty status with implants and grafts: Secondary | ICD-10-CM | POA: Diagnosis not present

## 2021-11-24 DIAGNOSIS — S8011XD Contusion of right lower leg, subsequent encounter: Secondary | ICD-10-CM | POA: Diagnosis not present

## 2021-11-24 DIAGNOSIS — I2510 Atherosclerotic heart disease of native coronary artery without angina pectoris: Secondary | ICD-10-CM | POA: Diagnosis not present

## 2021-11-24 DIAGNOSIS — Z950 Presence of cardiac pacemaker: Secondary | ICD-10-CM | POA: Diagnosis not present

## 2021-11-24 DIAGNOSIS — L97818 Non-pressure chronic ulcer of other part of right lower leg with other specified severity: Secondary | ICD-10-CM | POA: Diagnosis not present

## 2021-11-24 DIAGNOSIS — I255 Ischemic cardiomyopathy: Secondary | ICD-10-CM | POA: Diagnosis not present

## 2021-11-24 DIAGNOSIS — I70238 Atherosclerosis of native arteries of right leg with ulceration of other part of lower right leg: Secondary | ICD-10-CM

## 2021-11-24 DIAGNOSIS — S8011XA Contusion of right lower leg, initial encounter: Secondary | ICD-10-CM | POA: Diagnosis not present

## 2021-11-24 DIAGNOSIS — I87311 Chronic venous hypertension (idiopathic) with ulcer of right lower extremity: Secondary | ICD-10-CM | POA: Diagnosis not present

## 2021-11-24 DIAGNOSIS — Z87891 Personal history of nicotine dependence: Secondary | ICD-10-CM | POA: Diagnosis not present

## 2021-11-24 LAB — CUP PACEART REMOTE DEVICE CHECK
Battery Remaining Longevity: 114 mo
Battery Remaining Percentage: 100 %
Brady Statistic RA Percent Paced: 3 %
Brady Statistic RV Percent Paced: 100 %
Date Time Interrogation Session: 20230623024100
HighPow Impedance: 44 Ohm
Implantable Lead Implant Date: 20020206
Implantable Lead Implant Date: 20071005
Implantable Lead Implant Date: 20220624
Implantable Lead Implant Date: 20220624
Implantable Lead Location: 753858
Implantable Lead Location: 753859
Implantable Lead Location: 753860
Implantable Lead Location: 753860
Implantable Lead Model: 148
Implantable Lead Model: 4088
Implantable Lead Model: 4677
Implantable Lead Model: 7841
Implantable Lead Serial Number: 1073796
Implantable Lead Serial Number: 109512
Implantable Lead Serial Number: 224392
Implantable Lead Serial Number: 824442
Implantable Pulse Generator Implant Date: 20220624
Lead Channel Impedance Value: 462 Ohm
Lead Channel Impedance Value: 587 Ohm
Lead Channel Impedance Value: 903 Ohm
Lead Channel Pacing Threshold Amplitude: 0.6 V
Lead Channel Pacing Threshold Pulse Width: 0.4 ms
Lead Channel Setting Pacing Amplitude: 2 V
Lead Channel Setting Pacing Amplitude: 2.5 V
Lead Channel Setting Pacing Amplitude: 3 V
Lead Channel Setting Pacing Pulse Width: 0.4 ms
Lead Channel Setting Pacing Pulse Width: 1.2 ms
Lead Channel Setting Sensing Sensitivity: 0.5 mV
Lead Channel Setting Sensing Sensitivity: 1 mV
Pulse Gen Serial Number: 385552

## 2021-11-25 ENCOUNTER — Ambulatory Visit (INDEPENDENT_AMBULATORY_CARE_PROVIDER_SITE_OTHER): Payer: Medicare Other | Admitting: Vascular Surgery

## 2021-11-25 DIAGNOSIS — I739 Peripheral vascular disease, unspecified: Secondary | ICD-10-CM

## 2021-11-26 ENCOUNTER — Other Ambulatory Visit: Payer: Self-pay

## 2021-11-26 DIAGNOSIS — I70239 Atherosclerosis of native arteries of right leg with ulceration of unspecified site: Secondary | ICD-10-CM

## 2021-11-26 NOTE — Progress Notes (Signed)
Remote ICD transmission.   

## 2021-11-27 ENCOUNTER — Telehealth: Payer: Self-pay

## 2021-11-27 NOTE — Telephone Encounter (Signed)
   Pre-operative Risk Assessment    Patient Name: [name]  DOB: 1933-10-31 MRN: [id number]      Request for Surgical Clearance    Procedure:   Right Femoral Endarterectomy and Right Superficial Femoral Artery Stenting  Date of Surgery:  Clearance 12/01/21                                 Surgeon:  Dr. Jamelle Haring Surgeon's Group or Practice Name:  [facility] Phone number:  [phone] Fax number:  [phone]   Type of Clearance Requested:   - Medical    Type of Anesthesia:  Not Indicated   Additional requests/questions:  Please advise surgeon/provider what medications should be held.  Signed, Elsie Lincoln Nadene Witherspoon   11/27/2021, 4:00 PM

## 2021-11-27 NOTE — Telephone Encounter (Signed)
   Name: [name]  DOB: 1933-11-28  MRN: [id number]   Primary Cardiologist: [name], MD  Chart reviewed as part of pre-operative protocol coverage. Patient was contacted 11/27/2021 in reference to pre-operative risk assessment for pending surgery as outlined below.  [name] was last seen on 11/03/2021 by Dr. Percival Spanish.  Since that day, VERDELL DYKMAN has done fairly well.  He has had no new cardiovascular symptoms.  I spoke with his daughter on the phone and most of his physical abilities are limited by his dementia.  He is able to walk but with assistance.  He does most of his daily activities independently other than needing some help getting out of the shower.  His wife and daughter both live with him and help him with his daily tasks.  It does not sound like he meets the minimum METS requirements of 4.0.  I will go ahead and reach out to Dr. Percival Spanish on clearance.   The patient was advised that if he develops new symptoms prior to surgery to contact our office to arrange for a follow-up visit, and he verbalized understanding.  I will route this recommendation to the requesting party via Epic fax function and remove from pre-op pool. Please call with questions.  Elgie Collard, PA-C 11/27/2021, 4:12 PM   Help getting out of shower, sitting in recliner.

## 2021-11-27 NOTE — Progress Notes (Signed)
Surgical Instructions    Your procedure is scheduled on Monday, July 3.  Report to [facility] Main Entrance "A" at 5:30 A.M., then check in with the Admitting office.  Call this number if you have problems the morning of surgery:  [phone]   If you have any questions prior to your surgery date call [phone]: Open Monday-Friday 8am-4pm    Remember:  Do not eat or drink after midnight the night before your surgery     Take these medicines the morning of surgery with A SIP OF WATER:  atorvastatin (LIPITOR)  metoprolol (TOPROL-XL)  isosorbide mononitrate (IMDUR)  hydrALAZINE (APRESOLINE)  pantoprazole (PROTONIX)  SYNTHROID  mirtazapine (REMERON)  montelukast (SINGULAIR)  Polyethyl Glycol-Propyl Glycol (SYSTANE OP) carboxymethylcellul-glycerin (REFRESH OPTIVE) FLOVENT HFA 110 MCG/ACT inhaler acetaminophen (TYLENOL) if needed ipratropium-albuterol (DUONEB)  if needed  Please bring all inhalers with you the day of surgery.    Follow your surgeon's instructions on whether to stop or continue taking Aspirin and clopidogrel (PLAVIX) .  If no instructions were given by your surgeon then you will need to call the office to get those instructions.    As of today, STOP taking any Aleve, Naproxen, Ibuprofen, Motrin, Advil, Goody's, BC's, all herbal medications, fish oil, and all vitamins.  WHAT DO I DO ABOUT MY DIABETES MEDICATION?   Do not take oral diabetes medicines (pills) the morning of surgery.  THE NIGHT BEFORE SURGERY, take ___________ units of ___________insulin.       THE MORNING OF SURGERY, take _____________ units of __________insulin.  The day of surgery, do not take other diabetes injectables, including Byetta (exenatide), Bydureon (exenatide ER), Victoza (liraglutide), or Trulicity (dulaglutide).  If your CBG is greater than 220 mg/dL, you may take  of your sliding scale (correction) dose of insulin.   HOW TO MANAGE YOUR DIABETES BEFORE AND AFTER  SURGERY  Why is it important to control my blood sugar before and after surgery? Improving blood sugar levels before and after surgery helps healing and can limit problems. A way of improving blood sugar control is eating a healthy diet by:  Eating less sugar and carbohydrates  Increasing activity/exercise  Talking with your doctor about reaching your blood sugar goals High blood sugars (greater than 180 mg/dL) can raise your risk of infections and slow your recovery, so you will need to focus on controlling your diabetes during the weeks before surgery. Make sure that the doctor who takes care of your diabetes knows about your planned surgery including the date and location.  How do I manage my blood sugar before surgery? Check your blood sugar at least 4 times a day, starting 2 days before surgery, to make sure that the level is not too high or low.  Check your blood sugar the morning of your surgery when you wake up and every 2 hours until you get to the Short Stay unit.  If your blood sugar is less than 70 mg/dL, you will need to treat for low blood sugar: Do not take insulin. Treat a low blood sugar (less than 70 mg/dL) with  cup of clear juice (cranberry or apple), 4 glucose tablets, OR glucose gel. Recheck blood sugar in 15 minutes after treatment (to make sure it is greater than 70 mg/dL). If your blood sugar is not greater than 70 mg/dL on recheck, call [phone] for further instructions. Report your blood sugar to the short stay nurse when you get to Short Stay.  If you are admitted to the hospital  after surgery: Your blood sugar will be checked by the staff and you will probably be given insulin after surgery (instead of oral diabetes medicines) to make sure you have good blood sugar levels. The goal for blood sugar control after surgery is 80-180 mg/dL.             [facility] is not responsible for any belongings or valuables. .   Do NOT Smoke (Tobacco/Vaping)  24  hours prior to your procedure  If you use a CPAP at night, you may bring your mask for your overnight stay.   Contacts, glasses, hearing aids, dentures or partials may not be worn into surgery, please bring cases for these belongings   For patients admitted to the hospital, discharge time will be determined by your treatment team.   Patients discharged the day of surgery will not be allowed to drive home, and someone needs to stay with them for 24 hours.   SURGICAL WAITING ROOM VISITATION Patients having surgery or a procedure in a hospital may have two support people. Children under the age of 93 must have an adult with them who is not the patient. They may stay in the waiting area during the procedure and may switch out with other visitors. If the patient needs to stay at the hospital during part of their recovery, the visitor guidelines for inpatient rooms apply.  Please refer to the [facility] website for the visitor guidelines for Inpatients (after your surgery is over and you are in a regular room).       Special instructions:    Oral Hygiene is also important to reduce your risk of infection.  Remember - BRUSH YOUR TEETH THE MORNING OF SURGERY WITH YOUR REGULAR TOOTHPASTE   [place]- Preparing For Surgery  Before surgery, you can play an important role. Because skin is not sterile, your skin needs to be as free of germs as possible. You can reduce the number of germs on your skin by washing with CHG (chlorahexidine gluconate) Soap before surgery.  CHG is an antiseptic cleaner which kills germs and bonds with the skin to continue killing germs even after washing.     Please do not use if you have an allergy to CHG or antibacterial soaps. If your skin becomes reddened/irritated stop using the CHG.  Do not shave (including legs and underarms) for at least 48 hours prior to first CHG shower. It is OK to shave your face.  Please follow these instructions carefully.      Shower the NIGHT BEFORE SURGERY and the MORNING OF SURGERY with CHG Soap.   If you chose to wash your hair, wash your hair first as usual with your normal shampoo. After you shampoo, rinse your hair and body thoroughly to remove the shampoo.  Then [employer] and genitals (private parts) with your normal soap and rinse thoroughly to remove soap.  After that Use CHG Soap as you would any other liquid soap. You can apply CHG directly to the skin and wash gently with a scrungie or a clean washcloth.   Apply the CHG Soap to your body ONLY FROM THE NECK DOWN.  Do not use on open wounds or open sores. Avoid contact with your eyes, ears, mouth and genitals (private parts). Wash Face and genitals (private parts)  with your normal soap.   Wash thoroughly, paying special attention to the area where your surgery will be performed.  Thoroughly rinse your body with warm water from the neck  down.  DO NOT shower/wash with your normal soap after using and rinsing off the CHG Soap.  Pat yourself dry with a CLEAN TOWEL.  Wear CLEAN PAJAMAS to bed the night before surgery  Place CLEAN SHEETS on your bed the night before your surgery  DO NOT SLEEP WITH PETS.   Day of Surgery:  Take a shower with CHG soap. Wear Clean/Comfortable clothing the morning of surgery Do not wear jewelry or makeup Do not wear lotions, powders, perfumes/colognes, or deodorant. Do not shave 48 hours prior to surgery.  Men may shave face and neck. Do not bring valuables to the hospital. Do not wear nail polish, gel polish, artificial nails, or any other type of covering on natural nails (fingers and toes) If you have artificial nails or gel coating that need to be removed by a nail salon, please have this removed prior to surgery. Artificial nails or gel coating may interfere with anesthesia's ability to adequately monitor your vital signs.  Remember to brush your teeth WITH YOUR REGULAR TOOTHPASTE.    If you received a COVID test  during your pre-op visit, it is requested that you wear a mask when out in public, stay away from anyone that may not be feeling well, and notify your surgeon if you develop symptoms. If you have been in contact with anyone that has tested positive in the last 10 days, please notify your surgeon.    Please read over the following fact sheets that you were given.

## 2021-11-28 ENCOUNTER — Encounter (HOSPITAL_COMMUNITY): Payer: Self-pay

## 2021-11-28 ENCOUNTER — Other Ambulatory Visit: Payer: Self-pay

## 2021-11-28 ENCOUNTER — Encounter (HOSPITAL_COMMUNITY)
Admission: RE | Admit: 2021-11-28 | Discharge: 2021-11-28 | Disposition: A | Discharge status: Home / Self Care | Payer: Medicare Other | Source: Ambulatory Visit | Attending: Vascular Surgery | Admitting: Vascular Surgery

## 2021-11-28 ENCOUNTER — Encounter: Payer: Self-pay | Admitting: Internal Medicine

## 2021-11-28 VITALS — BP 134/46 | HR 56 | Temp 97.5°F | Resp 17 | Ht 69.0 in | Wt 154.0 lb

## 2021-11-28 DIAGNOSIS — Z7901 Long term (current) use of anticoagulants: Secondary | ICD-10-CM | POA: Diagnosis not present

## 2021-11-28 DIAGNOSIS — I70239 Atherosclerosis of native arteries of right leg with ulceration of unspecified site: Secondary | ICD-10-CM | POA: Diagnosis not present

## 2021-11-28 DIAGNOSIS — Z01812 Encounter for preprocedural laboratory examination: Secondary | ICD-10-CM | POA: Insufficient documentation

## 2021-11-28 DIAGNOSIS — Z01818 Encounter for other preprocedural examination: Secondary | ICD-10-CM

## 2021-11-28 HISTORY — DX: Gastro-esophageal reflux disease without esophagitis: K21.9

## 2021-11-28 HISTORY — DX: Unspecified dementia, unspecified severity, without behavioral disturbance, psychotic disturbance, mood disturbance, and anxiety: F03.90

## 2021-11-28 LAB — COMPREHENSIVE METABOLIC PANEL
ALT: 8 U/L (ref 0–44)
AST: 15 U/L (ref 15–41)
Albumin: 3.2 g/dL — ABNORMAL LOW (ref 3.5–5.0)
Alkaline Phosphatase: 43 U/L (ref 38–126)
Anion gap: 7 (ref 5–15)
BUN: 27 mg/dL — ABNORMAL HIGH (ref 8–23)
CO2: 25 mmol/L (ref 22–32)
Calcium: 9.1 mg/dL (ref 8.9–10.3)
Chloride: 108 mmol/L (ref 98–111)
Creatinine, Ser: 1.71 mg/dL — ABNORMAL HIGH (ref 0.61–1.24)
GFR, Estimated: 39 mL/min — ABNORMAL LOW (ref >60)
Glucose, Bld: 107 mg/dL — ABNORMAL HIGH (ref 70–99)
Potassium: 4.6 mmol/L (ref 3.5–5.1)
Sodium: 140 mmol/L (ref 135–145)
Total Bilirubin: 0.9 mg/dL (ref 0.3–1.2)
Total Protein: 7.5 g/dL (ref 6.5–8.1)

## 2021-11-28 LAB — URINALYSIS, ROUTINE W REFLEX MICROSCOPIC
Bilirubin Urine: NEGATIVE
Glucose, UA: NEGATIVE mg/dL
Hgb urine dipstick: NEGATIVE
Ketones, ur: NEGATIVE mg/dL
Leukocytes,Ua: NEGATIVE
Nitrite: NEGATIVE
Protein, ur: NEGATIVE mg/dL
Specific Gravity, Urine: 1.012 (ref 1.005–1.030)
pH: 5 (ref 5.0–8.0)

## 2021-11-28 LAB — CBC
HCT: 30 % — ABNORMAL LOW (ref 39.0–52.0)
Hemoglobin: 9.2 g/dL — ABNORMAL LOW (ref 13.0–17.0)
MCH: 28.8 pg (ref 26.0–34.0)
MCHC: 30.7 g/dL (ref 30.0–36.0)
MCV: 93.8 fL (ref 80.0–100.0)
Platelets: 247 10*3/uL (ref 150–400)
RBC: 3.2 MIL/uL — ABNORMAL LOW (ref 4.22–5.81)
RDW: 22.2 % — ABNORMAL HIGH (ref 11.5–15.5)
WBC: 9.2 10*3/uL (ref 4.0–10.5)
nRBC: 0 % (ref 0.0–0.2)

## 2021-11-28 LAB — PROTIME-INR
INR: 1.4 — ABNORMAL HIGH (ref 0.8–1.2)
Prothrombin Time: 17.5 seconds — ABNORMAL HIGH (ref 11.4–15.2)

## 2021-11-28 LAB — TYPE AND SCREEN
ABO/RH(D): A POS
Antibody Screen: NEGATIVE

## 2021-11-28 LAB — APTT: aPTT: 42 seconds — ABNORMAL HIGH (ref 24–36)

## 2021-11-28 LAB — SURGICAL PCR SCREEN
MRSA, PCR: NEGATIVE
Staphylococcus aureus: NEGATIVE

## 2021-11-28 LAB — GLUCOSE, CAPILLARY: Glucose-Capillary: 113 mg/dL — ABNORMAL HIGH (ref 70–99)

## 2021-11-28 NOTE — Progress Notes (Signed)
Pt's stepdaughter Elizabeth Sauer has signed consents. She states she signs most all paperwork for him. He has a son but there is no contact with him per Mardene Celeste and her mother, pt's wife, has alzheimers. She says she is in the process of doing HCPOA paperwork. Okay per Agricultural consultant.

## 2021-11-28 NOTE — Progress Notes (Signed)
PERIOPERATIVE PRESCRIPTION FOR IMPLANTED CARDIAC DEVICE PROGRAMMING  Patient Information: Name:  [name]  DOB:  03-10-1934  MRN:  [id number]    Planned Procedure:    RIGHT FEMORAL ENDARTERECTOMY - Right  RIGHT SUPERFICIAL FEMORAL ARTERY STENTING - Right   Surgeon:  Jamelle Haring, MD  Date of Procedure:  12/01/21  Cautery will be used.  Position during surgery:  supine   Please send documentation back to:  [facility] (Fax # [phone])  Device Information:  Clinic EP Physician:  Cristopher Peru, MD   Device Type:  Pacemaker and Defibrillator Manufacturer and Phone #:  [employer]: [phone] Pacemaker Dependent?:  No. Date of Last Device Check:  11/21/2021 Normal Device Function?:  Yes.    Electrophysiologist's Recommendations:  Have magnet available. Provide continuous ECG monitoring when magnet is used or reprogramming is to be performed.  Procedure should not interfere with device function.  No device programming or magnet placement needed.  Per Device Clinic Standing Orders, Wanda Plump, RN  11:52 AM 11/28/2021

## 2021-11-28 NOTE — Progress Notes (Signed)
Surgical Instructions    Your procedure is scheduled on Monday, July 3.  Report to [facility] Main Entrance "A" at 5:30 A.M., then check in with the Admitting office.  Call this number if you have problems the morning of surgery:  [phone]   If you have any questions prior to your surgery date call [phone]: Open Monday-Friday 8am-4pm    Remember:  Do not eat or drink after midnight the night before your surgery     Take these medicines the morning of surgery with A SIP OF WATER:  atorvastatin (LIPITOR)  metoprolol (TOPROL-XL)  famotidine (PEPCID)  isosorbide mononitrate (IMDUR)  hydrALAZINE (APRESOLINE)  pantoprazole (PROTONIX)  SYNTHROID  mirtazapine (REMERON)  montelukast (SINGULAIR)  Polyethyl Glycol-Propyl Glycol (SYSTANE OP) FLOVENT HFA 110 MCG/ACT inhaler acetaminophen (TYLENOL) if needed   Please bring all inhalers with you the day of surgery.    Continue Aspirin. Hold Plavix 5 days. Last dose will be 7/7.  As of today, STOP taking any Aleve, Naproxen, Ibuprofen, Motrin, Advil, Goody's, BC's, all herbal medications, fish oil, and all vitamins.             [facility] is not responsible for any belongings or valuables. .   Do NOT Smoke (Tobacco/Vaping)  24 hours prior to your procedure  If you use a CPAP at night, you may bring your mask for your overnight stay.   Contacts, glasses, hearing aids, dentures or partials may not be worn into surgery, please bring cases for these belongings   For patients admitted to the hospital, discharge time will be determined by your treatment team.   Patients discharged the day of surgery will not be allowed to drive home, and someone needs to stay with them for 24 hours.   SURGICAL WAITING ROOM VISITATION Patients having surgery or a procedure in a hospital may have two support people. Children under the age of 63 must have an adult with them who is not the patient. They may stay in the waiting area during the  procedure and may switch out with other visitors. If the patient needs to stay at the hospital during part of their recovery, the visitor guidelines for inpatient rooms apply.  Please refer to the [facility] website for the visitor guidelines for Inpatients (after your surgery is over and you are in a regular room).       Special instructions:    Oral Hygiene is also important to reduce your risk of infection.  Remember - BRUSH YOUR TEETH THE MORNING OF SURGERY WITH YOUR REGULAR TOOTHPASTE   [facility]- Preparing For Surgery  Before surgery, you can play an important role. Because skin is not sterile, your skin needs to be as free of germs as possible. You can reduce the number of germs on your skin by washing with CHG (chlorahexidine gluconate) Soap before surgery.  CHG is an antiseptic cleaner which kills germs and bonds with the skin to continue killing germs even after washing.     Please do not use if you have an allergy to CHG or antibacterial soaps. If your skin becomes reddened/irritated stop using the CHG.  Do not shave (including legs and underarms) for at least 48 hours prior to first CHG shower. It is OK to shave your face.  Please follow these instructions carefully.     Shower the NIGHT BEFORE SURGERY and the MORNING OF SURGERY with CHG Soap.   If you chose to wash your hair, wash your hair first as usual  with your normal shampoo. After you shampoo, rinse your hair and body thoroughly to remove the shampoo.  Then [employer] and genitals (private parts) with your normal soap and rinse thoroughly to remove soap.  After that Use CHG Soap as you would any other liquid soap. You can apply CHG directly to the skin and wash gently with a scrungie or a clean washcloth.   Apply the CHG Soap to your body ONLY FROM THE NECK DOWN.  Do not use on open wounds or open sores. Avoid contact with your eyes, ears, mouth and genitals (private parts). Wash Face and genitals (private parts)   with your normal soap.   Wash thoroughly, paying special attention to the area where your surgery will be performed.  Thoroughly rinse your body with warm water from the neck down.  DO NOT shower/wash with your normal soap after using and rinsing off the CHG Soap.  Pat yourself dry with a CLEAN TOWEL.  Wear CLEAN PAJAMAS to bed the night before surgery  Place CLEAN SHEETS on your bed the night before your surgery  DO NOT SLEEP WITH PETS.   Day of Surgery:  Take a shower with CHG soap. Wear Clean/Comfortable clothing the morning of surgery Do not wear jewelry Do not wear lotions, powders, colognes, or deodorant.  Men may shave face and neck. Do not bring valuables to the hospital.  Remember to brush your teeth WITH YOUR REGULAR TOOTHPASTE.    If you received a COVID test during your pre-op visit, it is requested that you wear a mask when out in public, stay away from anyone that may not be feeling well, and notify your surgeon if you develop symptoms. If you have been in contact with anyone that has tested positive in the last 10 days, please notify your surgeon.    Please read over the following fact sheets that you were given.

## 2021-11-28 NOTE — Progress Notes (Signed)
PCP - Dr. [name] Cardiologist - Dr. [name]  PPM/ICD - ICD- [facility] Scientific Device Orders - awaiting orders, requested 6/29 by Ailene Ravel, RN Rep Notified - Roderic Palau notified and made aware of pt's surgery  Chest x-ray - 11/23/20 EKG - 11/03/21 Stress Test - 2013 ECHO - 03/08/18 Cardiac Cath - denies  Sleep Study - 2023, OSA+ CPAP - no, pt does not tolerate  DM- denies  Blood Thinner Instructions: Hold Plavix 5 days. Last dose 7/7 Aspirin Instructions: continue thru [facility] - no, NPO   COVID TEST- n/a   Anesthesia review: yes, ICD, cardiac hx  Patient denies shortness of breath, fever, cough and chest pain at PAT appointment   All instructions explained to the patient, with a verbal understanding of the material. Patient agrees to go over the instructions while at home for a better understanding. The opportunity to ask questions was provided.

## 2021-12-01 NOTE — Progress Notes (Incomplete)
Anesthesia Chart Review:  Patient follows with cardiology for history of CAD, ischemic cardiomyopathy, chronic systolic heart failure, V. tach s/p ICD insertion ~20 years ago with CRT-D upgrade June 2022.  Cardiac clearance per telephone encounter 12/03/2021, "Chart reviewed as part of pre-operative protocol coverage. Per Dr. Percival Shea, primary cardiologist "the patient has lower functional level and co moribid illness which makes him higher risk for surgery. Using ACS calculator his risk of serious complication is 85% with risk of cardiac complication 5% and death 15%. No further cardiovascular testing or med changes prior will reduce this risk." Therefore, given past medical history and time since last visit, based on ACC/AHA guidelines, [name] would be at acceptable risk for the planned procedure without further cardiovascular testing. The patient's daughter (DPR on file) was advised that if the patient develops new symptoms prior to surgery to contact our office to arrange for a follow-up visit, and she verbalized understanding."       Patient reports last dose Plavix 12/05/2021.  OSA, intolerant to CPAP.  History of renal insufficiency.  Preop labs reviewed, creatinine mildly elevated 1.71 (baseline ~1.4), anemia with hemoglobin 9.2 (baseline ~10.2).  EKG 11/04/2019: Atrial sensed ventricular paced rhythm.  Rate 57.  Perioperative prescription for implanted cardiac device programming per progress note 11/28/2021: Device Information:   Clinic EP Physician:  Jonathon Peru, MD    Device Type:  Pacemaker and Defibrillator Manufacturer and Phone #:  [employer]: [phone] Pacemaker Dependent?:  No. Date of Last Device Check:  11/21/2021       Normal Device Function?:  Yes.     Electrophysiologist's Recommendations:   Have magnet available. Provide continuous ECG monitoring when magnet is used or reprogramming is to be performed.  Procedure should not interfere with device  function.  No device programming or magnet placement needed.  TTE 03/08/2018: - Left ventricle: The cavity size was mildly dilated. Systolic    function was severely reduced. The estimated ejection fraction    was in the range of 25% to 30%. Severe diffuse hypokinesis with    distinct regional wall motion abnormalities. There is akinesis of    the basalinferoseptal myocardium. There is akinesis of the    inferior myocardium. There was an increased relative contribution    of atrial contraction to ventricular filling. Doppler parameters    are consistent with abnormal left ventricular relaxation (grade 1    diastolic dysfunction).  - Ventricular septum: Septal motion showed moderate paradox. These    changes are consistent with intraventricular conduction delay.  - Aortic valve: Trileaflet; mildly thickened, moderately calcified    leaflets.  - Mitral valve: There was trivial regurgitation.  - Left atrium: The atrium was mildly dilated.  - Right ventricle: Pacer wire or catheter noted in right ventricle.  - Tricuspid valve: There was trivial regurgitation.   [name] [facility] Short Stay Center/Anesthesiology Phone [phone] 12/03/2021 1:49 PM

## 2021-12-03 NOTE — Anesthesia Preprocedure Evaluation (Addendum)
Anesthesia Evaluation  Patient identified by MRN, date of birth, ID band Patient awake    Reviewed: Allergy & Precautions, NPO status , Patient's Chart, lab work & pertinent test results  Airway Mallampati: II  TM Distance: >3 FB Neck ROM: Full    Dental  (+) Edentulous Upper, Edentulous Lower   Pulmonary asthma , COPD, former smoker,    Pulmonary exam normal breath sounds clear to auscultation       Cardiovascular hypertension, Pt. on medications and Pt. on home beta blockers + CAD, + Past MI, + Cardiac Stents, + Peripheral Vascular Disease and +CHF  + pacemaker + Cardiac Defibrillator  Rhythm:Irregular Rate:Normal + Systolic murmurs Echo  - Left ventricle: The cavity size was mildly dilated. Systolic function was severely reduced. The estimated ejection fraction was in the range of 25% to 30%. Severe diffuse hypokinesis withdistinct regional wall motion abnormalities. There is akinesis of the basalinferoseptal myocardium. There is akinesis of the inferior myocardium. There was an increased relative contributionof atrial contraction to ventricular filling. Doppler parametersare consistent with abnormal left ventricular relaxation (grade 1 diastolic dysfunction).  - Ventricular septum: Septal motion showed moderate paradox. Thesechanges are consistent with intraventricular conduction delay.  - Aortic valve: Trileaflet; mildly thickened, moderately calcifiedleaflets.  - Mitral valve: There was trivial regurgitation.  - Left atrium: The atrium was mildly dilated.  - Right ventricle: Pacer wire or catheter noted in right ventricle.  - Tricuspid valve: There was trivial regurgitation.    Neuro/Psych PSYCHIATRIC DISORDERS Dementia  Neuromuscular disease    GI/Hepatic Neg liver ROS, GERD  ,  Endo/Other  diabetesHypothyroidism   Renal/GU negative Renal ROS     Musculoskeletal negative musculoskeletal ROS (+)    Abdominal   Peds  Hematology  (+) Blood dyscrasia, anemia ,   Anesthesia Other Findings   Reproductive/Obstetrics                           Anesthesia Physical Anesthesia Plan  ASA: 4  Anesthesia Plan: MAC   Post-op Pain Management: Tylenol PO (pre-op)*   Induction: Intravenous  PONV Risk Score and Plan: 3 and Ondansetron, Dexamethasone and Treatment may vary due to age or medical condition  Airway Management Planned: Oral ETT  Additional Equipment: Arterial line  Intra-op Plan:   Post-operative Plan:   Informed Consent: I have reviewed the patients History and Physical, chart, labs and discussed the procedure including the risks, benefits and alternatives for the proposed anesthesia with the patient or authorized representative who has indicated his/her understanding and acceptance.     Dental advisory given  Plan Discussed with: CRNA  Anesthesia Plan Comments: (2 x PIV, +/- CVL  PAT note by Karoline Caldwell, PA-C: Patient follows with cardiology for history of CAD, ischemic cardiomyopathy, chronic systolic heart failure, V. tach s/p ICD insertion ~20 years ago with CRT-D upgrade June 2022.  Cardiac clearance per telephone encounter 12/03/2021, "Chart reviewed as part of pre-operative protocol coverage.Per Dr. Percival Spanish, primary cardiologist "the patient haslower functional level and co moribid illness which makes him higher risk for surgery. Using ACS calculator his risk of serious complication is 37% with risk of cardiac complication 5% and death 15%. No further cardiovascular testing or med changes prior will reduce this risk." Therefore, given past medical history and time since last visit, based on ACC/AHA guidelines,Jonathon W Flinchumwould be at acceptable risk for the planned procedure without further cardiovascular testing. The patient's daughter (DPR on file)was advised that if the  patientdevelops new symptoms prior to surgery to contact our office  to arrange for a follow-up visit, and sheverbalized understanding."     Patient reports last dose Plavix 12/05/2021.  OSA, intolerant to CPAP.  History of renal insufficiency.  Preop labs reviewed, creatinine mildly elevated 1.71 (baseline ~1.4), anemia with hemoglobin 9.2 (baseline ~10.2).  EKG 11/04/2019: Atrial sensed ventricular paced rhythm.  Rate 57.  Perioperative prescription for implanted cardiac device programming per progress note 11/28/2021: Device Information:  Clinic EP Physician:Gregg [name], MD  Device Type:Pacemaker and Defibrillator Manufacturer and Phone #:[employer]: [phone] Pacemaker Dependent?:No. Date of Last Device Check:6/23/2023Normal Device Function?:Yes.  Electrophysiologist's Recommendations:  . Have magnet available. . Provide continuous ECG monitoring when magnet is used or reprogramming is to be performed. . Procedure should not interfere with device function. No device programming or magnet placement needed.  TTE 03/08/2018: - Left ventricle: The cavity size was mildly dilated. Systolic  function was severely reduced. The estimated ejection fraction  was in the range of 25% to 30%. Severe diffuse hypokinesis with  distinct regional wall motion abnormalities. There is akinesis of  the basalinferoseptal myocardium. There is akinesis of the  inferior myocardium. There was an increased relative contribution  of atrial contraction to ventricular filling. Doppler parameters  are consistent with abnormal left ventricular relaxation (grade 1  diastolic dysfunction).  - Ventricular septum: Septal motion showed moderate paradox. These  changes are consistent with intraventricular conduction delay.  - Aortic valve: Trileaflet; mildly thickened, moderately calcified  leaflets.  - Mitral valve: There was trivial regurgitation.  - Left atrium: The atrium was mildly dilated.  - Right ventricle:  Pacer wire or catheter noted in right ventricle.  - Tricuspid valve: There was trivial regurgitation. )     Anesthesia Quick Evaluation

## 2021-12-03 NOTE — Telephone Encounter (Signed)
   Patient Name: [name]  DOB: Mar 10, 1934 MRN: [id number]  Primary Cardiologist: [name], MD  Chart reviewed as part of pre-operative protocol coverage. Per Dr. Percival Spanish, primary cardiologist "the patient has lower functional level and co moribid illness which makes him higher risk for surgery. Using ACS calculator his risk of serious complication is 41% with risk of cardiac complication 5% and death 15%. No further cardiovascular testing or med changes prior will reduce this risk."  Therefore, given past medical history and time since last visit, based on ACC/AHA guidelines, [name] would be at acceptable risk for the planned procedure without further cardiovascular testing.       The patient's daughter (DPR on file) was advised that if the patient develops new symptoms prior to surgery to contact our office to arrange for a follow-up visit, and she verbalized understanding.  I will route this recommendation to the requesting party via Epic fax function and remove from pre-op pool.  Please call with questions.  Lenna Sciara, NP 12/03/2021, 10:30 AM

## 2021-12-05 ENCOUNTER — Telehealth: Payer: Self-pay | Admitting: Cardiology

## 2021-12-05 NOTE — Telephone Encounter (Signed)
Daughter called stating the patient has been having really bad wound pain and would like to know what type of medication he can be given that would not affect his heart.

## 2021-12-05 NOTE — Telephone Encounter (Signed)
Tylenol would be the safest option. No more than 3g per day.

## 2021-12-05 NOTE — Telephone Encounter (Signed)
Spoke with pt daughter, aware of the recommendations

## 2021-12-05 NOTE — Telephone Encounter (Signed)
Left message for patient to inquire about "wound pain" Will send to MD, pharmacy team for any safe analgesics that he could possibly take

## 2021-12-08 ENCOUNTER — Encounter (HOSPITAL_BASED_OUTPATIENT_CLINIC_OR_DEPARTMENT_OTHER): Payer: Medicare Other | Attending: Internal Medicine | Admitting: Internal Medicine

## 2021-12-08 DIAGNOSIS — Z87891 Personal history of nicotine dependence: Secondary | ICD-10-CM | POA: Diagnosis not present

## 2021-12-08 DIAGNOSIS — Z7982 Long term (current) use of aspirin: Secondary | ICD-10-CM | POA: Diagnosis not present

## 2021-12-08 DIAGNOSIS — Z841 Family history of disorders of kidney and ureter: Secondary | ICD-10-CM | POA: Diagnosis not present

## 2021-12-08 DIAGNOSIS — E785 Hyperlipidemia, unspecified: Secondary | ICD-10-CM | POA: Diagnosis not present

## 2021-12-08 DIAGNOSIS — Z888 Allergy status to other drugs, medicaments and biological substances status: Secondary | ICD-10-CM | POA: Diagnosis not present

## 2021-12-08 DIAGNOSIS — S8011XD Contusion of right lower leg, subsequent encounter: Secondary | ICD-10-CM | POA: Diagnosis not present

## 2021-12-08 DIAGNOSIS — I87311 Chronic venous hypertension (idiopathic) with ulcer of right lower extremity: Secondary | ICD-10-CM | POA: Insufficient documentation

## 2021-12-08 DIAGNOSIS — I509 Heart failure, unspecified: Secondary | ICD-10-CM | POA: Diagnosis not present

## 2021-12-08 DIAGNOSIS — Z95810 Presence of automatic (implantable) cardiac defibrillator: Secondary | ICD-10-CM | POA: Diagnosis not present

## 2021-12-08 DIAGNOSIS — Z7902 Long term (current) use of antithrombotics/antiplatelets: Secondary | ICD-10-CM | POA: Diagnosis not present

## 2021-12-08 DIAGNOSIS — I2510 Atherosclerotic heart disease of native coronary artery without angina pectoris: Secondary | ICD-10-CM | POA: Diagnosis not present

## 2021-12-08 DIAGNOSIS — I70238 Atherosclerosis of native arteries of right leg with ulceration of other part of lower right leg: Secondary | ICD-10-CM | POA: Diagnosis not present

## 2021-12-08 DIAGNOSIS — X58XXXD Exposure to other specified factors, subsequent encounter: Secondary | ICD-10-CM | POA: Insufficient documentation

## 2021-12-08 DIAGNOSIS — I70232 Atherosclerosis of native arteries of right leg with ulceration of calf: Secondary | ICD-10-CM | POA: Diagnosis not present

## 2021-12-08 DIAGNOSIS — M109 Gout, unspecified: Secondary | ICD-10-CM | POA: Diagnosis not present

## 2021-12-08 DIAGNOSIS — E1151 Type 2 diabetes mellitus with diabetic peripheral angiopathy without gangrene: Secondary | ICD-10-CM | POA: Diagnosis not present

## 2021-12-08 DIAGNOSIS — L97818 Non-pressure chronic ulcer of other part of right lower leg with other specified severity: Secondary | ICD-10-CM | POA: Diagnosis not present

## 2021-12-08 DIAGNOSIS — Z882 Allergy status to sulfonamides status: Secondary | ICD-10-CM | POA: Diagnosis not present

## 2021-12-08 DIAGNOSIS — E039 Hypothyroidism, unspecified: Secondary | ICD-10-CM | POA: Diagnosis not present

## 2021-12-08 DIAGNOSIS — Z79899 Other long term (current) drug therapy: Secondary | ICD-10-CM | POA: Diagnosis not present

## 2021-12-08 DIAGNOSIS — Z79890 Hormone replacement therapy (postmenopausal): Secondary | ICD-10-CM | POA: Diagnosis not present

## 2021-12-08 DIAGNOSIS — I739 Peripheral vascular disease, unspecified: Secondary | ICD-10-CM | POA: Diagnosis present

## 2021-12-08 DIAGNOSIS — I130 Hypertensive heart and chronic kidney disease with heart failure and stage 1 through stage 4 chronic kidney disease, or unspecified chronic kidney disease: Secondary | ICD-10-CM | POA: Diagnosis not present

## 2021-12-08 DIAGNOSIS — N183 Chronic kidney disease, stage 3 unspecified: Secondary | ICD-10-CM | POA: Diagnosis not present

## 2021-12-08 DIAGNOSIS — I252 Old myocardial infarction: Secondary | ICD-10-CM | POA: Diagnosis not present

## 2021-12-08 DIAGNOSIS — J449 Chronic obstructive pulmonary disease, unspecified: Secondary | ICD-10-CM | POA: Diagnosis not present

## 2021-12-08 DIAGNOSIS — I70249 Atherosclerosis of native arteries of left leg with ulceration of unspecified site: Secondary | ICD-10-CM | POA: Diagnosis not present

## 2021-12-08 DIAGNOSIS — E119 Type 2 diabetes mellitus without complications: Secondary | ICD-10-CM | POA: Diagnosis not present

## 2021-12-08 NOTE — Progress Notes (Signed)
SHYNE, LEHRKE ([id number]) Visit Report for 12/08/2021 Chief Complaint Document Details Patient Name: Date of Service: [name], [name] 12/08/2021 2:15 PM Medical Record Number: [id number] Patient Account Number: [id number] Date of Birth/Sex: Treating RN: September 16, 1933 (88 y.o. [name] Primary Care Provider: [name] Other Clinician: Referring Provider: Treating Provider/Extender: [name] in Treatment: 10 Information Obtained from: Patient Chief Complaint 09/26/2021; patient is here for a review of a complicated wound on the right anterior lower leg which was initially secondary to trauma Electronic Signature(s) Signed: 12/08/2021 3:09:28 PM By: Kalman Shan DO Entered By: Kalman Shan on 12/08/2021 15:05:30 -------------------------------------------------------------------------------- HPI Details Patient Name: Date of Service: [name]. 12/08/2021 2:15 PM Medical Record Number: [id number] Patient Account Number: [id number] Date of Birth/Sex: Treating RN: 1934/03/05 (88 y.o. [name] Primary Care Provider: [name] Other Clinician: Referring Provider: Treating Provider/Extender: [name] in Treatment: 10 History of Present Illness HPI Description: ADMISSION 09/26/21 This is a 88 year old man who lives near [place]. He is accompanied by his daughter who lives with him. His wife is also at home. Apparently 2 months ago he traumatized the right anterior lower leg x2 in quick succession including once on the back of a pickup truck or car. By description it sounds as though he had a black eschar on this which fell off about a month ago. He has been left with an open wound. It this is not that large but is completely slough covered and has undermining of at least a centimeter and half from 12-6 o'clock. He received a course of antibiotics earlier this month from his primary care doctor Ceftin.  He is not on antibiotics currently. The patient has a complicated relevant history. 2019 he underwent bilateral common iliac artery stenting and angioplasties by Dr. Carlis Abbott. He had ABIs at the same time which showed a ABI at 0.55 on the right and a TBI of 0.38. He had a TBI of 0.43 on the left they could not do an ABI because he could not tolerate the pressure cuff. He has not followed up. The patient is apparently a diabetic although he has not been on any treatment in years and its not clear that he is even followed up. Other past medical history includes coronary artery disease, ischemic cardiomyopathy status post pacemaker, he is on Plavix, He did not cooperate with our attempt to do ABIs on the right leg in our clinic 5/5; patient presents for follow-up. Last week he was started on Iodosorb under Kerlix/Coban. He has no issues or complaints today. He has his venous and arterial studies scheduled for next week. He currently denies signs of infection. 5/16; patient presents for follow-up. Last week he was started on Hydrofera Blue and gentamicin and mupirocin ointment under Kerlix/Coban. He tolerated this well. He had a PCR culture that reported high levels of Streptococcus agalactiae, Pseudomonas aeruginosa and Enterococcus bacillus. Keystone antibiotics were ordered and he has this however did not bring it today. He had ABIs with TBI's and venous reflux studies. He had evidence of reflux throughout the right and left venous system. His ABIs on the right was noncompressible with a TBI of 0.19. He currently denies systemic signs of infection. 5/23; patient presents for follow-up. He has been using Keystone antibiotic to the wound bed without issues. He currently denies signs of infection. He has not heard back from Dr. Kennon Holter office. 6/8; patient is back his wound is somewhat better with less undermining but still  the same basic surface area. The wound surface looks better in terms of nonviable  material. But the other issue that concerns me is this patient probably has some minimal distance claudication and may even have claudication at rest at night in the right leg. Referencing my notes from his admission he has had previous bilateral common iliac artery stenting and angioplasties in 2019. For some reason I do not know that he ever had any follow-up. 6/19; patient presents for follow-up. He saw Dr. Luan Pulling on 6/16 that recommended a CT angiogram to evaluate iliac stents and right common femoral disease. Patient is using Keystone antibiotic with [facility] without issues. He denies signs of infection. 6/26; patient presents for follow-up. He had a CT angiogram that showed a patent common iliac artery. There is advanced right femoral popliteal disease including SFA stenosis with plaque extending into the SFA and profunda femoris. 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 He has an appointment scheduled tomorrow with Dr. Roselie Awkward to review these results. 7/10; patient presents for follow-up. He is scheduled for right femoral endarterectomy on 7/13 with Dr. Roselie Awkward. He has developed a smaller wound more proximal to the original wound. He is not sure how this started. He has been using Keystone antibiotic and Hydrofera Blue to the wound bed. He denies signs of infection. Electronic Signature(s) Signed: 12/08/2021 3:09:28 PM By: Kalman Shan DO Entered By: Kalman Shan on 12/08/2021 15:06:35 -------------------------------------------------------------------------------- Physical Exam Details Patient Name: Date of Service: [name], Jonathon W. 12/08/2021 2:15 PM Medical Record Number: [id number] Patient Account Number: [id number] Date of Birth/Sex: Treating RN: Sep 19, 1933 (88 y.o. [name] Primary Care Provider: [name] Other Clinician: Referring Provider: Treating Provider/Extender: [name] in Treatment: 10 Constitutional respirations regular, non-labored and within target range for patient.Marland Kitchen Psychiatric pleasant and cooperative. Notes Right lower extremity: T the mid tibial aspect there is an open wound with granulation tissue and nonviable tissue. No signs of surrounding infection. o Electronic Signature(s) Signed: 12/08/2021 3:09:28 PM By: Kalman Shan DO Entered By: Kalman Shan on 12/08/2021 15:07:20 -------------------------------------------------------------------------------- Physician Orders Details Patient Name: Date of Service: [name] 12/08/2021 2:15 PM Medical Record Number: [id number] Patient Account Number: [id number] Date of Birth/Sex: Treating RN: 1933/09/05 (88 y.o. [name], [name] Primary Care Provider: [name] Other Clinician: Referring Provider: Treating Provider/Extender: [name] in Treatment: 10 Verbal / Phone Orders: No Diagnosis Coding ICD-10 Coding Code Description S80.11XD Contusion of right lower leg, subsequent encounter L97.818 Non-pressure chronic ulcer of other part of right lower leg with other specified severity I87.311 Chronic venous hypertension (idiopathic) with ulcer of right lower extremity I70.238 Atherosclerosis of native arteries of right leg with ulceration of other part of lower leg Follow-up Appointments ppointment in 1 week. - Dr. Heber [place] [facility] [place], Room 8 12/16/2021 130pm Return A Bathing/ Shower/ Hygiene May shower with protection but do not get wound dressing(s) wet. Edema Control - Lymphedema / SCD / Other Elevate legs to the level of the heart or above for 30 minutes daily and/or when sitting, a frequency of: - 3-4 times a day throughout the day. Avoid standing for long periods of time. Exercise regularly Wound Treatment Wound #1 - Lower Leg Wound Laterality: Right, Anterior Cleanser: Soap and Water 1 x Per Day/30 Days Discharge Instructions: May  shower and wash wound with dial antibacterial soap and water prior to dressing change. Cleanser: Wound Cleanser (Generic) 1 x Per Day/30 Days Discharge  Instructions: Cleanse the wound with wound cleanser prior to applying a clean dressing using gauze sponges, not tissue or cotton balls. Peri-Wound Care: Skin Prep (Generic) 1 x Per Day/30 Days Discharge Instructions: Use skin prep as directed Peri-Wound Care: Sween Lotion (Moisturizing lotion) 1 x Per Day/30 Days Discharge Instructions: Apply moisturizing lotion as directed Topical: Keystone compounding topical antibiotics 1 x Per Day/30 Days Discharge Instructions: apply daily directly to wound bed. Prim Dressing: Hydrofera Blue Ready Foam, 4x5 in 1 x Per Day/30 Days ary Discharge Instructions: Apply to wound bed as instructed Secondary Dressing: Zetuvit Plus Silicone Border Dressing 4x4 (in/in) (Generic) 1 x Per Day/30 Days Discharge Instructions: Apply silicone border over primary dressing as directed. Electronic Signature(s) Signed: 12/08/2021 3:09:28 PM By: Kalman Shan DO Entered By: Kalman Shan on 12/08/2021 15:07:26 -------------------------------------------------------------------------------- Problem List Details Patient Name: Date of Service: [name]. 12/08/2021 2:15 PM Medical Record Number: [id number] Patient Account Number: [id number] Date of Birth/Sex: Treating RN: June 21, 1933 (88 y.o. [name], Meta.Reding Primary Care Provider: [name] Other Clinician: Referring Provider: Treating Provider/Extender: [name] in Treatment: 10 Active Problems ICD-10 Encounter Code Description Active Date MDM Diagnosis S80.11XD Contusion of right lower leg, subsequent encounter 09/26/2021 No Yes L97.818 Non-pressure chronic ulcer of other part of right lower leg with other specified 09/26/2021 No Yes severity I87.311 Chronic venous hypertension (idiopathic) with ulcer of right lower  extremity 09/26/2021 No Yes I70.238 Atherosclerosis of native arteries of right leg with ulceration of other part of 10/14/2021 No Yes lower leg Inactive Problems Resolved Problems Electronic Signature(s) Signed: 12/08/2021 3:09:28 PM By: Kalman Shan DO Entered By: Kalman Shan on 12/08/2021 15:05:18 -------------------------------------------------------------------------------- Progress Note Details Patient Name: Date of Service: [name]. 12/08/2021 2:15 PM Medical Record Number: [id number] Patient Account Number: [id number] Date of Birth/Sex: Treating RN: 02/18/1934 (88 y.o. [name] Primary Care Provider: [name] Other Clinician: Referring Provider: Treating Provider/Extender: [name] in Treatment: 10 Subjective Chief Complaint Information obtained from Patient 09/26/2021; patient is here for a review of a complicated wound on the right anterior lower leg which was initially secondary to trauma History of Present Illness (HPI) ADMISSION 09/26/21 This is a 88 year old man who lives near [place]. He is accompanied by his daughter who lives with him. His wife is also at home. Apparently 2 months ago he traumatized the right anterior lower leg x2 in quick succession including once on the back of a pickup truck or car. By description it sounds as though he had a black eschar on this which fell off about a month ago. He has been left with an open wound. It this is not that large but is completely slough covered and has undermining of at least a centimeter and half from 12-6 o'clock. He received a course of antibiotics earlier this month from his primary care doctor Ceftin. He is not on antibiotics currently. The patient has a complicated relevant history. 2019 he underwent bilateral common iliac artery stenting and angioplasties by Dr. Carlis Abbott. He had ABIs at the same time which showed a ABI at 0.55 on the right and a TBI of 0.38. He  had a TBI of 0.43 on the left they could not do an ABI because he could not tolerate the pressure cuff. He has not followed up. The patient is apparently a diabetic although he has not been on any treatment in years and its not clear that he is even followed up. Other past medical history includes  coronary artery disease, ischemic cardiomyopathy status post pacemaker, he is on Plavix, He did not cooperate with our attempt to do ABIs on the right leg in our clinic 5/5; patient presents for follow-up. Last week he was started on Iodosorb under Kerlix/Coban. He has no issues or complaints today. He has his venous and arterial studies scheduled for next week. He currently denies signs of infection. 5/16; patient presents for follow-up. Last week he was started on Hydrofera Blue and gentamicin and mupirocin ointment under Kerlix/Coban. He tolerated this well. He had a PCR culture that reported high levels of Streptococcus agalactiae, Pseudomonas aeruginosa and Enterococcus bacillus. Keystone antibiotics were ordered and he has this however did not bring it today. He had ABIs with TBI's and venous reflux studies. He had evidence of reflux throughout the right and left venous system. His ABIs on the right was noncompressible with a TBI of 0.19. He currently denies systemic signs of infection. 5/23; patient presents for follow-up. He has been using Keystone antibiotic to the wound bed without issues. He currently denies signs of infection. He has not heard back from Dr. Kennon Holter office. 6/8; patient is back his wound is somewhat better with less undermining but still the same basic surface area. The wound surface looks better in terms of nonviable material. But the other issue that concerns me is this patient probably has some minimal distance claudication and may even have claudication at rest at night in the right leg. Referencing my notes from his admission he has had previous bilateral common iliac artery  stenting and angioplasties in 2019. For some reason I do not know that he ever had any follow-up. 6/19; patient presents for follow-up. He saw Dr. Luan Pulling on 6/16 that recommended a CT angiogram to evaluate iliac stents and right common femoral disease. Patient is using Keystone antibiotic with [facility] without issues. He denies signs of infection. 6/26; patient presents for follow-up. He had a CT angiogram that showed a patent common iliac artery. There is advanced right femoral popliteal disease including SFA stenosis with plaque extending into the SFA and profunda femoris. 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 He has an appointment scheduled tomorrow with Dr. Roselie Awkward to review these results. 7/10; patient presents for follow-up. He is scheduled for right femoral endarterectomy on 7/13 with Dr. Roselie Awkward. He has developed a smaller wound more proximal to the original wound. He is not sure how this started. He has been using Keystone antibiotic and Hydrofera Blue to the wound bed. He denies signs of infection. Patient History Information obtained from Patient, Chart. Family History Cancer - Siblings, Kidney Disease - Father. Social History Former smoker - quit 1985, Marital Status - Married, Alcohol Use - Never, Drug Use - No History, Caffeine Use - Daily. Medical History Eyes Patient has history of Cataracts - Extraction 2018 Hematologic/Lymphatic Patient has history of Anemia Respiratory Patient has history of Asthma, Chronic Obstructive Pulmonary Disease (COPD) Cardiovascular Patient has history of Congestive Heart Failure, Coronary Artery Disease, Hypertension, Myocardial Infarction - 2009, Peripheral Arterial Disease Endocrine Patient has history of Type II Diabetes - Diet Controlled-does not take any medications Hospitalization/Surgery History - angioplasty stent- 2019 dr.Clark. - carotid stents. Medical A Surgical  History Notes nd Eyes thyroid eye disease, hypertrophia of right eye, hypotropia of left eye, diplopia- corrected per daughter Cardiovascular Pacemaker/defibrillator Endocrine Thyroiditis, Hypothyroidism Objective Constitutional respirations regular, non-labored and within target range for patient.. Vitals Time Taken: 2:13 PM, Height: 69  in, Weight: 168 lbs, BMI: 24.8, Temperature: 97.5 F, Pulse: 73 bpm, Respiratory Rate: 18 breaths/min, Blood Pressure: 135/70 mmHg. Psychiatric pleasant and cooperative. General Notes: Right lower extremity: T the mid tibial aspect there is an open wound with granulation tissue and nonviable tissue. No signs of surrounding o infection. Integumentary (Hair, Skin) Wound #1 status is Open. Original cause of wound was Trauma. The date acquired was: 07/02/2021. The wound has been in treatment 10 weeks. The wound is located on the Right,Anterior Lower Leg. The wound measures 3.2cm length x 2cm width x 0.2cm depth; 5.027cm^2 area and 1.005cm^3 volume. There is Fat Layer (Subcutaneous Tissue) exposed. There is no tunneling noted, however, there is undermining starting at 12:00 and ending at 4:00 with a maximum distance of 0.4cm. There is a medium amount of serosanguineous drainage noted. The wound margin is epibole. There is large (67-100%) red, pink granulation within the wound bed. There is a small (1-33%) amount of necrotic tissue within the wound bed including Adherent Slough. Assessment Active Problems ICD-10 Contusion of right lower leg, subsequent encounter Non-pressure chronic ulcer of other part of right lower leg with other specified severity Chronic venous hypertension (idiopathic) with ulcer of right lower extremity Atherosclerosis of native arteries of right leg with ulceration of other part of lower leg Patient has developed a small wound more proximal to the original wound. It looks like he may have hit his leg causing this. No signs of  infection. He is scheduled for a right femoral endarterectomy this week. I recommended continuing Keystone antibiotic with Hydrofera Blue. Follow-up in 1 week. Plan Follow-up Appointments: Return Appointment in 1 week. - Dr. Heber [place] and [place], Room 8 12/16/2021 130pm Bathing/ Shower/ Hygiene: May shower with protection but do not get wound dressing(s) wet. Edema Control - Lymphedema / SCD / Other: Elevate legs to the level of the heart or above for 30 minutes daily and/or when sitting, a frequency of: - 3-4 times a day throughout the day. Avoid standing for long periods of time. Exercise regularly WOUND #1: - Lower Leg Wound Laterality: Right, Anterior Cleanser: Soap and Water 1 x Per Day/30 Days Discharge Instructions: May shower and wash wound with dial antibacterial soap and water prior to dressing change. Cleanser: Wound Cleanser (Generic) 1 x Per Day/30 Days Discharge Instructions: Cleanse the wound with wound cleanser prior to applying a clean dressing using gauze sponges, not tissue or cotton balls. Peri-Wound Care: Skin Prep (Generic) 1 x Per Day/30 Days Discharge Instructions: Use skin prep as directed Peri-Wound Care: Sween Lotion (Moisturizing lotion) 1 x Per Day/30 Days Discharge Instructions: Apply moisturizing lotion as directed Topical: Keystone compounding topical antibiotics 1 x Per Day/30 Days Discharge Instructions: apply daily directly to wound bed. Prim Dressing: Hydrofera Blue Ready Foam, 4x5 in 1 x Per Day/30 Days ary Discharge Instructions: Apply to wound bed as instructed Secondary Dressing: Zetuvit Plus Silicone Border Dressing 4x4 (in/in) (Generic) 1 x Per Day/30 Days Discharge Instructions: Apply silicone border over primary dressing as directed. 1. Keystone antibiotic with Hydrofera Blue 2. Follow-up in 1 week Electronic Signature(s) Signed: 12/08/2021 3:09:28 PM By: Kalman Shan DO Entered By: Kalman Shan on 12/08/2021  15:08:47 -------------------------------------------------------------------------------- HxROS Details Patient Name: Date of Service: [name]. 12/08/2021 2:15 PM Medical Record Number: [id number] Patient Account Number: [id number] Date of Birth/Sex: Treating RN: 05-Feb-1934 (88 y.o. [name] Primary Care Provider: [name] Other Clinician: Referring Provider: Treating Provider/Extender: [name] in Treatment: 10 Information Obtained From Patient  Chart Eyes Medical History: Positive for: Cataracts - Extraction 2018 Past Medical History Notes: thyroid eye disease, hypertrophia of right eye, hypotropia of left eye, diplopia- corrected per daughter Hematologic/Lymphatic Medical History: Positive for: Anemia Respiratory Medical History: Positive for: Asthma; Chronic Obstructive Pulmonary Disease (COPD) Cardiovascular Medical History: Positive for: Congestive Heart Failure; Coronary Artery Disease; Hypertension; Myocardial Infarction - 2009; Peripheral Arterial Disease Past Medical History Notes: Pacemaker/defibrillator Endocrine Medical History: Positive for: Type II Diabetes - Diet Controlled-does not take any medications Past Medical History Notes: Thyroiditis, Hypothyroidism Time with diabetes: 2019 Treated with: Diet Blood sugar tested every day: No HBO Extended History Items Eyes: Cataracts Immunizations Pneumococcal Vaccine: Received Pneumococcal Vaccination: No Implantable Devices Yes Hospitalization / Surgery History Type of Hospitalization/Surgery angioplasty stent- 2019 dr.Clark carotid stents Family and Social History Cancer: Yes - Siblings; Kidney Disease: Yes - Father; Former smoker - quit 1985; Marital Status - Married; Alcohol Use: Never; Drug Use: No History; Caffeine Use: Daily; Financial Concerns: No; Food, Clothing or Shelter Needs: No; Support System Lacking: No; Transportation Concerns:  No Electronic Signature(s) Signed: 12/08/2021 3:09:28 PM By: Kalman Shan DO Signed: 12/08/2021 4:29:33 PM By: Deon Pilling RN, BSN Entered By: Kalman Shan on 12/08/2021 15:06:40 -------------------------------------------------------------------------------- SuperBill Details Patient Name: Date of Service: [name]. 12/08/2021 Medical Record Number: [id number] Patient Account Number: [id number] Date of Birth/Sex: Treating RN: [birth year] (88 y.o. [name], [name] Primary Care Provider: [name] Other Clinician: Referring Provider: Treating Provider/Extender: [name] in Treatment: 10 Diagnosis Coding ICD-10 Codes Code Description S80.11XD Contusion of right lower leg, subsequent encounter L97.818 Non-pressure chronic ulcer of other part of right lower leg with other specified severity I87.311 Chronic venous hypertension (idiopathic) with ulcer of right lower extremity I70.238 Atherosclerosis of native arteries of right leg with ulceration of other part of lower leg Facility Procedures CPT4 Code: [id number] Description: 29924 - WOUND CARE VISIT-LEV 3 EST PT Modifier: Quantity: 1 Physician Procedures Electronic Signature(s) Signed: 12/08/2021 3:09:28 PM By: Kalman Shan DO Entered By: Kalman Shan on 12/08/2021 15:09:00

## 2021-12-09 NOTE — Progress Notes (Signed)
[name], [name] ([id number]) Visit Report for 12/08/2021 Arrival Information Details Patient Name: Date of Service: [name], [name] 12/08/2021 2:15 PM Medical Record Number: [id number] Patient Account Number: [id number] Date of Birth/Sex: Treating RN: [birth year][name] (88 y.o. [name], [name] Primary Care Yola Paradiso: [name] Other Clinician: Referring [name]: Treating Tria Noguera/Extender: [name] in Treatment: 10 Visit Information History Since Last Visit Added or deleted any medications: No Patient Arrived: Ambulatory Any new allergies or adverse reactions: No Arrival Time: 14:Shea Had a fall or experienced change in No Accompanied By: daughter activities of daily living that may affect Transfer Assistance: Other risk of falls: Patient Identification Verified: Yes Signs or symptoms of abuse/neglect since last visito No Secondary Verification Process Completed: Yes Hospitalized since last visit: No Patient Requires Transmission-Based Precautions: No Implantable device outside of the clinic excluding No Patient Has Alerts: Yes cellular tissue based products placed in the center Patient Alerts: [id number] to obtain;pain since last visit: Has Dressing in Place as Prescribed: Yes Pain Present Now: Yes Notes Scheduled for right femoral entoarterectomy by vascular on 12/11/2021. Electronic Signature(s) Signed: 12/08/2021 4:29:33 PM By: Deon Pilling RN, BSN Entered By: Deon Pilling on 12/08/2021 14:15:25 -------------------------------------------------------------------------------- Clinic Level of Care Assessment Details Patient Name: Date of Service: [name] 12/08/2021 2:15 PM Medical Record Number: [id number] Patient Account Number: [id number] Date of Birth/Sex: Treating RN: [birth year] (88 y.o. [name], [name] Primary Care Naketa Daddario: [name] Other Clinician: Referring Jesselle Laflamme: Treating Haneef Hallquist/Extender: [name] in Treatment: 10 Clinic Level of Care Assessment Items TOOL 4 Quantity Score X- 1 0 Use when only an EandM is performed on FOLLOW-UP visit ASSESSMENTS - Nursing Assessment / Reassessment X- 1 10 Reassessment of Co-morbidities (includes updates in patient status) X- 1 5 Reassessment of Adherence to Treatment Plan ASSESSMENTS - Wound and Skin A ssessment / Reassessment X - Simple Wound Assessment / Reassessment - one wound 1 5 '[]'$  - 0 Complex Wound Assessment / Reassessment - multiple wounds X- 1 10 Dermatologic / Skin Assessment (not related to wound area) ASSESSMENTS - Focused Assessment X- 1 5 Circumferential Edema Measurements - multi extremities X- 1 10 Nutritional Assessment / Counseling / Intervention '[]'$  - 0 Lower Extremity Assessment (monofilament, tuning fork, pulses) '[]'$  - 0 Peripheral Arterial Disease Assessment (using hand held doppler) ASSESSMENTS - Ostomy and/or Continence Assessment and Care '[]'$  - 0 Incontinence Assessment and Management '[]'$  - 0 Ostomy Care Assessment and Management (repouching, etc.) PROCESS - Coordination of Care X - Simple Patient / Family Education for ongoing care 1 15 '[]'$  - 0 Complex (extensive) Patient / Family Education for ongoing care X- 1 10 Staff obtains Consents, Records, T Results / Process Orders est '[]'$  - 0 Staff telephones HHA, Nursing Homes / Clarify orders / etc '[]'$  - 0 Routine Transfer to another Facility (non-emergent condition) '[]'$  - 0 Routine Hospital Admission (non-emergent condition) '[]'$  - 0 New Admissions / Biomedical engineer / Ordering NPWT Apligraf, etc. , '[]'$  - 0 Emergency Hospital Admission (emergent condition) X- 1 10 Simple Discharge Coordination '[]'$  - 0 Complex (extensive) Discharge Coordination PROCESS - Special Needs '[]'$  - 0 Pediatric / Minor Patient Management '[]'$  - 0 Isolation Patient Management '[]'$  - 0 Hearing / Language / Visual special needs '[]'$  - 0 Assessment of Community assistance  (transportation, D/C planning, etc.) '[]'$  - 0 Additional assistance / Altered mentation '[]'$  - 0 Support Surface(s) Assessment (bed, cushion, seat, etc.) INTERVENTIONS - Wound Cleansing / Measurement X - Simple Wound Cleansing -  one wound 1 5 '[]'$  - 0 Complex Wound Cleansing - multiple wounds X- 1 5 Wound Imaging (photographs - any number of wounds) '[]'$  - 0 Wound Tracing (instead of photographs) X- 1 5 Simple Wound Measurement - one wound '[]'$  - 0 Complex Wound Measurement - multiple wounds INTERVENTIONS - Wound Dressings X - Small Wound Dressing one or multiple wounds 1 10 '[]'$  - 0 Medium Wound Dressing one or multiple wounds '[]'$  - 0 Large Wound Dressing one or multiple wounds '[]'$  - 0 Application of Medications - topical '[]'$  - 0 Application of Medications - injection INTERVENTIONS - Miscellaneous '[]'$  - 0 External ear exam '[]'$  - 0 Specimen Collection (cultures, biopsies, blood, body fluids, etc.) '[]'$  - 0 Specimen(s) / Culture(s) sent or taken to Lab for analysis '[]'$  - 0 Patient Transfer (multiple staff / Civil Service fast streamer / Similar devices) '[]'$  - 0 Simple Staple / Suture removal (25 or less) '[]'$  - 0 Complex Staple / Suture removal (26 or more) '[]'$  - 0 Hypo / Hyperglycemic Management (close monitor of Blood Glucose) '[]'$  - 0 Ankle / Brachial Index (ABI) - do not check if billed separately X- 1 5 Vital Signs Has the patient been seen at the hospital within the last three years: Yes Total Score: 110 Level Of Care: New/Established - Level 3 Electronic Signature(s) Signed: 12/08/2021 4:29:33 PM By: Deon Pilling RN, BSN Entered By: Deon Pilling on 12/08/2021 14:18:41 -------------------------------------------------------------------------------- Encounter Discharge Information Details Patient Name: Date of Service: [name]. 12/08/2021 2:15 PM Medical Record Number: [id number] Patient Account Number: [id number] Date of Birth/Sex: Treating RN: [birth year]-[name] (88 y.o. [name] Primary  Care Aritza Brunet: [name] Other Clinician: Referring Shylyn Younce: Treating Arieh Bogue/Extender: [name] in Treatment: 10 Encounter Discharge Information Items Discharge Condition: Stable Ambulatory Status: Ambulatory Discharge Destination: Home Transportation: Private Auto Accompanied By: daughter Schedule Follow-up Appointment: Yes Clinical Summary of Care: Electronic Signature(s) Signed: 12/08/2021 4:29:33 PM By: Deon Pilling RN, BSN Entered By: Deon Pilling on 12/08/2021 14:19:09 -------------------------------------------------------------------------------- Multi Wound Chart Details Patient Name: Date of Service: [name]. 12/08/2021 2:15 PM Medical Record Number: [id number] Patient Account Number: [id number] Date of Birth/Sex: Treating RN: [name], [name] (88 y.o. [name], [name] Primary Care Andrea Colglazier: [name] Other Clinician: Referring Avrianna Smart: Treating Binyomin Brann/Extender: [name] in Treatment: 10 Vital Signs Height(in): 34 Pulse(bpm): 73 Weight(lbs): 168 Blood Pressure(mmHg): 135/70 Body Mass Index(BMI): 24.8 Temperature(F): 97.5 Respiratory Rate(breaths/min): 18 Photos: [1:Right, Anterior Lower Leg] [N/A:N/A N/A] Wound Location: [1:Trauma] [N/A:N/A] Wounding Event: [1:Abrasion] [N/A:N/A] Primary Etiology: [1:Diabetic Wound/Ulcer of the Lower] [N/A:N/A] Secondary Etiology: [1:Extremity Cataracts, Anemia, Asthma, Chronic N/A] Comorbid History: [1:Obstructive Pulmonary Disease (COPD), Congestive Heart Failure, Coronary Artery Disease, Hypertension, Myocardial Infarction, Peripheral Arterial Disease, Type II Diabetes 07/02/2021] [N/A:N/A] Date Acquired: [1:10] [N/A:N/A] Weeks of Treatment: [1:Open] [N/A:N/A] Wound Status: [1:No] [N/A:N/A] Wound Recurrence: [1:3.2x2x0.2] [N/A:N/A] Measurements L x W x D (cm) [1:5.027] [N/A:N/A] A (cm) : rea [1:1.005] [N/A:N/A] Volume (cm) : [1:-39.40%] [N/A:N/A] %  Reduction in A rea: [1:30.30%] [N/A:N/A] % Reduction in Volume: [1:Shea] Starting Position 1 (o'clock): [1:4] Ending Position 1 (o'clock): [1:0.4] Maximum Distance 1 (cm): [1:Yes] [N/A:N/A] Undermining: [1:Full Thickness Without Exposed] [N/A:N/A] Classification: [1:Support Structures Medium] [N/A:N/A] Exudate Amount: [1:Serosanguineous] [N/A:N/A] Exudate Type: [1:[name], brown] [N/A:N/A] Exudate Color: [1:Epibole] [N/A:N/A] Wound Margin: [1:Large (67-100%)] [N/A:N/A] Granulation Amount: [1:[name], Pink] [N/A:N/A] Granulation Quality: [1:Small (1-33%)] [N/A:N/A] Necrotic Amount: [1:Fat Layer (Subcutaneous Tissue): Yes N/A] Exposed Structures: [1:Fascia: No Tendon: No Muscle: No Joint: No Bone: No Small (1-33%)] [N/A:N/A] Treatment  Notes Wound #1 (Lower Leg) Wound Laterality: Right, Anterior Cleanser Soap and Water Discharge Instruction: May shower and wash wound with dial antibacterial soap and water prior to dressing change. Wound Cleanser Discharge Instruction: Cleanse the wound with wound cleanser prior to applying a clean dressing using gauze sponges, not tissue or cotton balls. Peri-Wound Care Skin Prep Discharge Instruction: Use skin prep as directed Sween Lotion (Moisturizing lotion) Discharge Instruction: Apply moisturizing lotion as directed Topical Keystone compounding topical antibiotics Discharge Instruction: apply daily directly to wound bed. Primary Dressing Hydrofera Blue Ready Foam, 4x5 in Discharge Instruction: Apply to wound bed as instructed Secondary Dressing Zetuvit Plus Silicone Border Dressing 4x4 (in/in) Discharge Instruction: Apply silicone border over primary dressing as directed. Secured With Compression Wrap Compression Stockings Add-Ons Electronic Signature(s) Signed: 12/08/2021 3:09:Shea PM By: Kalman Shan DO Signed: 12/08/2021 4:29:33 PM By: Deon Pilling RN, BSN Entered By: Kalman Shan on 12/08/2021  15:05:22 -------------------------------------------------------------------------------- Multi-Disciplinary Care Plan Details Patient Name: Date of Service: [name] 12/08/2021 2:15 PM Medical Record Number: [id number] Patient Account Number: [id number] Date of Birth/Sex: Treating RN: [name], [name] (88 y.o. [name] Primary Care Arianni Gallego: [name] Other Clinician: Referring Shirely Toren: Treating Kylynn Street/Extender: [name] in Treatment: 10 Active Inactive Tissue Oxygenation Nursing Diagnoses: Potential alteration in peripheral tissue perfusion (select prior to confirmation of diagnosis) Goals: Non-invasive arterial studies are completed as ordered Date Initiated: 10/14/2021 Target Resolution Date: 7/Shea/2023 Goal Status: Active Interventions: Assess patient understanding of disease process and management upon diagnosis and as needed Assess peripheral arterial status upon admission and as needed Provide education on tissue oxygenation and ischemia Treatment Activities: Ankle Brachial Index (ABI) : 09/30/2021 Non-invasive vascular studies : 10/14/2021 Notes: Wound/Skin Impairment Nursing Diagnoses: Knowledge deficit related to ulceration/compromised skin integrity Goals: Patient/caregiver will verbalize understanding of skin care regimen Date Initiated: 4/Shea/2023 Target Resolution Date: 12/18/2021 Goal Status: Active Interventions: Assess patient/caregiver ability to perform ulcer/skin care regimen upon admission and as needed Assess ulceration(s) every visit Provide education on ulcer and skin care Treatment Activities: Skin care regimen initiated : 4/Shea/2023 Topical wound management initiated : 4/Shea/2023 Notes: 10/21/21: Wound care regimen continues, using [employer]stone and [employer]. Electronic Signature(s) Signed: 12/08/2021 4:29:33 PM By: Deon Pilling RN, BSN Signed: 12/08/2021 4:29:33 PM By: Deon Pilling RN, BSN Entered By:  Deon Pilling on 12/08/2021 14:16:14 -------------------------------------------------------------------------------- Pain Assessment Details Patient Name: Date of Service: [name]. 12/08/2021 2:15 PM Medical Record Number: [id number] Patient Account Number: [id number] Date of Birth/Sex: Treating RN: [name] (88 y.o. [name] Primary Care Jenavie Stanczak: [name] Other Clinician: Referring Kian Gamarra: Treating Tove Wideman/Extender: [name] in Treatment: 10 Active Problems Location of Pain Severity and Description of Pain Patient Has Paino No Site Locations Rate the pain. Current Pain Level: 0 Pain Management and Medication Current Pain Management: Medication: No Cold Application: No Rest: No Massage: No Activity: No T.E.N.S.: No Heat Application: No Leg drop or elevation: No Is the Current Pain Management Adequate: Adequate How does your wound impact your activities of daily livingo Sleep: No Bathing: No Appetite: No Relationship With Others: No Bladder Continence: No Emotions: No Bowel Continence: No Work: No Toileting: No Drive: No Dressing: No Hobbies: No Notes pain at night. Electronic Signature(s) Signed: 12/08/2021 4:29:33 PM By: Deon Pilling RN, BSN Entered By: Deon Pilling on 12/08/2021 14:14:57 -------------------------------------------------------------------------------- Patient/Caregiver Education Details Patient Name: Date of Service: [name] 7/10/2023andnbsp2:15 PM Medical Record Number: [id number] Patient Account Number: [id number] Date of Birth/Gender: Treating RN: [name] (87  y.o. [name] Primary Care Physician: [name] Other Clinician: Referring Physician: Treating Physician/Extender: [name] in Treatment: 10 Education Assessment Education Provided To: Patient Education Topics Provided Wound/Skin Impairment: Handouts: Skin Care Do's and  Dont's Methods: Explain/Verbal Responses: Reinforcements needed Electronic Signature(s) Signed: 12/08/2021 4:29:33 PM By: Deon Pilling RN, BSN Entered By: Deon Pilling on 12/08/2021 14:16:26 -------------------------------------------------------------------------------- Wound Assessment Details Patient Name: Date of Service: [name]. 12/08/2021 2:15 PM Medical Record Number: [id number] Patient Account Number: [id number] Date of Birth/Sex: Treating RN: [name] (88 y.o. [name], [name] Primary Care Bonnie Roig: [name] Other Clinician: Referring Nelani Schmelzle: Treating Donabelle Molden/Extender: [name] in Treatment: 10 Wound Status Wound Number: 1 Primary Abrasion Etiology: Wound Location: Right, Anterior Lower Leg Secondary Diabetic Wound/Ulcer of the Lower Extremity Wounding Event: Trauma Etiology: Date Acquired: 07/02/2021 Wound Open Weeks Of Treatment: 10 Status: Clustered Wound: No Comorbid Cataracts, Anemia, Asthma, Chronic Obstructive Pulmonary History: Disease (COPD), Congestive Heart Failure, Coronary Artery Disease, Hypertension, Myocardial Infarction, Peripheral Arterial Disease, Type II Diabetes Photos Wound Measurements Length: (cm) 3.2 Width: (cm) 2 Depth: (cm) 0.2 Area: (cm) 5.027 Volume: (cm) 1.005 % Reduction in Area: -39.4% % Reduction in Volume: 30.3% Epithelialization: Small (1-33%) Tunneling: No Undermining: Yes Starting Position (o'clock): Shea Ending Position (o'clock): 4 Maximum Distance: (cm) 0.4 Wound Description Classification: Full Thickness Without Exposed Support Structures Wound Margin: Epibole Exudate Amount: Medium Exudate Type: Serosanguineous Exudate Color: [name], brown Foul Odor After Cleansing: No Slough/Fibrino Yes Wound Bed Granulation Amount: Large (67-100%) Exposed Structure Granulation Quality: [name], Pink Fascia Exposed: No Necrotic Amount: Small (1-33%) Fat Layer (Subcutaneous Tissue)  Exposed: Yes Necrotic Quality: Adherent Slough Tendon Exposed: No Muscle Exposed: No Joint Exposed: No Bone Exposed: No Treatment Notes Wound #1 (Lower Leg) Wound Laterality: Right, Anterior Cleanser Soap and Water Discharge Instruction: May shower and wash wound with dial antibacterial soap and water prior to dressing change. Wound Cleanser Discharge Instruction: Cleanse the wound with wound cleanser prior to applying a clean dressing using gauze sponges, not tissue or cotton balls. Peri-Wound Care Skin Prep Discharge Instruction: Use skin prep as directed Sween Lotion (Moisturizing lotion) Discharge Instruction: Apply moisturizing lotion as directed Topical Keystone compounding topical antibiotics Discharge Instruction: apply daily directly to wound bed. Primary Dressing Hydrofera Blue Ready Foam, 4x5 in Discharge Instruction: Apply to wound bed as instructed Secondary Dressing Zetuvit Plus Silicone Border Dressing 4x4 (in/in) Discharge Instruction: Apply silicone border over primary dressing as directed. Secured With Compression Wrap Compression Stockings Environmental education officer) Signed: 12/08/2021 4:29:33 PM By: Deon Pilling RN, BSN Signed: 12/08/2021 4:44:20 PM By: Rhae Hammock RN Entered By: Rhae Hammock on 12/08/2021 14:21:Shea -------------------------------------------------------------------------------- Vitals Details Patient Name: Date of Service: [name]. 12/08/2021 2:15 PM Medical Record Number: [id number] Patient Account Number: [id number] Date of Birth/Sex: Treating RN: [birth year]-[name] (88 y.o. [name], [name] Primary Care Tinie Mcgloin: [name] Other Clinician: Referring Annete Ayuso: Treating Lisle Skillman/Extender: [name] in Treatment: 10 Vital Signs Time Taken: 14:13 Temperature (F): 97.5 Height (in): 69 Pulse (bpm): 73 Weight (lbs): 168 Respiratory Rate (breaths/min): 18 Body Mass Index (BMI):  24.8 Blood Pressure (mmHg): 135/70 Reference Range: 80 - 120 mg / dl Electronic Signature(s) Signed: 12/09/2021 10:34:18 AM By: Erenest Blank Entered By: Erenest Blank on 12/08/2021 14:13:35

## 2021-12-10 NOTE — Progress Notes (Signed)
Call [facility] at [phone] to inform him of 12/01/21 surgery rescheduled to Thursday, 12/11/21 at 0730 start time.   Patient has a Environmental manager.

## 2021-12-11 ENCOUNTER — Inpatient Hospital Stay (HOSPITAL_COMMUNITY): Payer: Medicare Other

## 2021-12-11 ENCOUNTER — Encounter (HOSPITAL_COMMUNITY): Payer: Self-pay | Admitting: Vascular Surgery

## 2021-12-11 ENCOUNTER — Inpatient Hospital Stay (HOSPITAL_COMMUNITY)
Admission: RE | Admit: 2021-12-11 | Discharge: 2021-12-11 | DRG: 253 | Disposition: A | Discharge status: Home / Self Care | Payer: Medicare Other | Attending: Vascular Surgery | Admitting: Vascular Surgery

## 2021-12-11 ENCOUNTER — Other Ambulatory Visit: Payer: Self-pay

## 2021-12-11 ENCOUNTER — Encounter (HOSPITAL_COMMUNITY)
Admission: RE | Disposition: A | Discharge status: Home / Self Care | Payer: Self-pay | Source: Home / Self Care | Attending: Vascular Surgery

## 2021-12-11 ENCOUNTER — Inpatient Hospital Stay (HOSPITAL_COMMUNITY): Payer: Medicare Other | Admitting: Physician Assistant

## 2021-12-11 DIAGNOSIS — Z7982 Long term (current) use of aspirin: Secondary | ICD-10-CM | POA: Diagnosis not present

## 2021-12-11 DIAGNOSIS — Z7902 Long term (current) use of antithrombotics/antiplatelets: Secondary | ICD-10-CM | POA: Diagnosis not present

## 2021-12-11 DIAGNOSIS — Z79890 Hormone replacement therapy (postmenopausal): Secondary | ICD-10-CM | POA: Diagnosis not present

## 2021-12-11 DIAGNOSIS — Z888 Allergy status to other drugs, medicaments and biological substances status: Secondary | ICD-10-CM | POA: Diagnosis not present

## 2021-12-11 DIAGNOSIS — Z79899 Other long term (current) drug therapy: Secondary | ICD-10-CM | POA: Diagnosis not present

## 2021-12-11 DIAGNOSIS — J449 Chronic obstructive pulmonary disease, unspecified: Secondary | ICD-10-CM

## 2021-12-11 DIAGNOSIS — Z87891 Personal history of nicotine dependence: Secondary | ICD-10-CM

## 2021-12-11 DIAGNOSIS — E785 Hyperlipidemia, unspecified: Secondary | ICD-10-CM | POA: Diagnosis present

## 2021-12-11 DIAGNOSIS — N183 Chronic kidney disease, stage 3 unspecified: Secondary | ICD-10-CM | POA: Diagnosis present

## 2021-12-11 DIAGNOSIS — I252 Old myocardial infarction: Secondary | ICD-10-CM | POA: Diagnosis not present

## 2021-12-11 DIAGNOSIS — I70232 Atherosclerosis of native arteries of right leg with ulceration of calf: Secondary | ICD-10-CM | POA: Diagnosis not present

## 2021-12-11 DIAGNOSIS — E039 Hypothyroidism, unspecified: Secondary | ICD-10-CM | POA: Diagnosis present

## 2021-12-11 DIAGNOSIS — E119 Type 2 diabetes mellitus without complications: Secondary | ICD-10-CM | POA: Diagnosis present

## 2021-12-11 DIAGNOSIS — I509 Heart failure, unspecified: Secondary | ICD-10-CM | POA: Diagnosis present

## 2021-12-11 DIAGNOSIS — E1151 Type 2 diabetes mellitus with diabetic peripheral angiopathy without gangrene: Secondary | ICD-10-CM

## 2021-12-11 DIAGNOSIS — Z95810 Presence of automatic (implantable) cardiac defibrillator: Secondary | ICD-10-CM

## 2021-12-11 DIAGNOSIS — I70238 Atherosclerosis of native arteries of right leg with ulceration of other part of lower right leg: Secondary | ICD-10-CM | POA: Diagnosis present

## 2021-12-11 DIAGNOSIS — Z841 Family history of disorders of kidney and ureter: Secondary | ICD-10-CM | POA: Diagnosis not present

## 2021-12-11 DIAGNOSIS — I2510 Atherosclerotic heart disease of native coronary artery without angina pectoris: Secondary | ICD-10-CM | POA: Diagnosis present

## 2021-12-11 DIAGNOSIS — M109 Gout, unspecified: Secondary | ICD-10-CM | POA: Diagnosis present

## 2021-12-11 DIAGNOSIS — I739 Peripheral vascular disease, unspecified: Secondary | ICD-10-CM | POA: Diagnosis present

## 2021-12-11 DIAGNOSIS — I70249 Atherosclerosis of native arteries of left leg with ulceration of unspecified site: Secondary | ICD-10-CM | POA: Diagnosis not present

## 2021-12-11 DIAGNOSIS — Z882 Allergy status to sulfonamides status: Secondary | ICD-10-CM

## 2021-12-11 DIAGNOSIS — I130 Hypertensive heart and chronic kidney disease with heart failure and stage 1 through stage 4 chronic kidney disease, or unspecified chronic kidney disease: Secondary | ICD-10-CM | POA: Diagnosis present

## 2021-12-11 HISTORY — PX: INSERTION OF ILIAC STENT: SHX6256

## 2021-12-11 HISTORY — PX: ULTRASOUND GUIDANCE FOR VASCULAR ACCESS: SHX6516

## 2021-12-11 LAB — POCT ACTIVATED CLOTTING TIME
Activated Clotting Time: 209 seconds
Activated Clotting Time: 251 seconds
Activated Clotting Time: 239 seconds

## 2021-12-11 LAB — GLUCOSE, CAPILLARY
Glucose-Capillary: 94 mg/dL (ref 70–99)
Glucose-Capillary: 131 mg/dL — ABNORMAL HIGH (ref 70–99)

## 2021-12-11 LAB — ABO/RH: ABO/RH(D): A POS

## 2021-12-11 SURGERY — INSERTION, STENT, ARTERY, ILIAC
Anesthesia: Monitor Anesthesia Care | Site: Groin

## 2021-12-11 MED ORDER — PROTAMINE SULFATE 10 MG/ML IV SOLN
INTRAVENOUS | Status: AC
Start: 2021-12-11 — End: ?
  Filled 2021-12-11: qty 5

## 2021-12-11 MED ORDER — PROPOFOL 10 MG/ML IV BOLUS
INTRAVENOUS | Status: AC
Start: 2021-12-11 — End: ?
  Filled 2021-12-11: qty 20

## 2021-12-11 MED ORDER — DEXAMETHASONE SODIUM PHOSPHATE 10 MG/ML IJ SOLN
INTRAMUSCULAR | Status: AC
  Filled 2021-12-11: qty 1

## 2021-12-11 MED ORDER — LACTATED RINGERS IV SOLN: INTRAVENOUS | Status: DC | PRN

## 2021-12-11 MED ORDER — INSULIN ASPART 100 UNIT/ML IJ SOLN: 0.0000 [IU] | INTRAMUSCULAR | Status: DC | PRN

## 2021-12-11 MED ORDER — DROPERIDOL 2.5 MG/ML IJ SOLN
0.6250 mg | Freq: Once | INTRAMUSCULAR | Status: AC | PRN
  Administered 2021-12-11: 0.625 mg via INTRAVENOUS

## 2021-12-11 MED ORDER — LIDOCAINE HCL (PF) 1 % IJ SOLN
INTRAMUSCULAR | Status: AC
Start: 2021-12-11 — End: ?
  Filled 2021-12-11: qty 30

## 2021-12-11 MED ORDER — FENTANYL CITRATE (PF) 250 MCG/5ML IJ SOLN
INTRAMUSCULAR | Status: DC | PRN
  Administered 2021-12-11 (×3): 25 ug via INTRAVENOUS

## 2021-12-11 MED ORDER — LIDOCAINE HCL (PF) 1 % IJ SOLN
INTRAMUSCULAR | Status: DC | PRN
  Administered 2021-12-11: 16 mL

## 2021-12-11 MED ORDER — IODIXANOL 320 MG/ML IV SOLN
INTRAVENOUS | Status: DC | PRN
  Administered 2021-12-11: 110 mL

## 2021-12-11 MED ORDER — CHLORHEXIDINE GLUCONATE CLOTH 2 % EX PADS: 6.0000 | MEDICATED_PAD | Freq: Once | CUTANEOUS | Status: DC

## 2021-12-11 MED ORDER — METOPROLOL SUCCINATE ER 25 MG PO TB24
ORAL_TABLET | ORAL | Status: AC
  Filled 2021-12-11: qty 4

## 2021-12-11 MED ORDER — SODIUM CHLORIDE 0.9 % IV SOLN: INTRAVENOUS | Status: DC

## 2021-12-11 MED ORDER — HEPARIN SODIUM (PORCINE) 1000 UNIT/ML IJ SOLN
INTRAMUSCULAR | Status: AC
Start: 2021-12-11 — End: ?
  Filled 2021-12-11: qty 10

## 2021-12-11 MED ORDER — ONDANSETRON HCL 4 MG/2ML IJ SOLN
INTRAMUSCULAR | Status: AC
Start: 2021-12-11 — End: ?
  Filled 2021-12-11: qty 2

## 2021-12-11 MED ORDER — ONDANSETRON HCL 4 MG/2ML IJ SOLN
INTRAMUSCULAR | Status: DC | PRN
  Administered 2021-12-11: 4 mg via INTRAVENOUS

## 2021-12-11 MED ORDER — FENTANYL CITRATE (PF) 250 MCG/5ML IJ SOLN
INTRAMUSCULAR | Status: AC
  Filled 2021-12-11: qty 5

## 2021-12-11 MED ORDER — HEPARIN SODIUM (PORCINE) 1000 UNIT/ML IJ SOLN
INTRAMUSCULAR | Status: DC | PRN
  Administered 2021-12-11: 3000 [IU] via INTRAVENOUS
  Administered 2021-12-11: 8000 [IU] via INTRAVENOUS

## 2021-12-11 MED ORDER — DROPERIDOL 2.5 MG/ML IJ SOLN
INTRAMUSCULAR | Status: AC
  Filled 2021-12-11: qty 2

## 2021-12-11 MED ORDER — EPHEDRINE SULFATE-NACL 50-0.9 MG/10ML-% IV SOSY
PREFILLED_SYRINGE | INTRAVENOUS | Status: DC | PRN
  Administered 2021-12-11 (×4): 2.5 mg via INTRAVENOUS

## 2021-12-11 MED ORDER — PROPOFOL 10 MG/ML IV BOLUS
INTRAVENOUS | Status: AC
  Filled 2021-12-11: qty 20

## 2021-12-11 MED ORDER — FENTANYL CITRATE (PF) 100 MCG/2ML IJ SOLN: 25.0000 ug | INTRAMUSCULAR | Status: DC | PRN

## 2021-12-11 MED ORDER — HEPARIN 6000 UNIT IRRIGATION SOLUTION
Status: DC | PRN
  Administered 2021-12-11: 1

## 2021-12-11 MED ORDER — METOPROLOL SUCCINATE ER 100 MG PO TB24
100.0000 mg | ORAL_TABLET | ORAL | Status: AC
Start: 2021-12-11 — End: 2021-12-11
  Administered 2021-12-11: 100 mg via ORAL
  Filled 2021-12-11: qty 1

## 2021-12-11 MED ORDER — HEPARIN 6000 UNIT IRRIGATION SOLUTION
Status: AC
Start: 2021-12-11 — End: ?
  Filled 2021-12-11: qty 500

## 2021-12-11 MED ORDER — LACTATED RINGERS IV SOLN: INTRAVENOUS | Status: DC

## 2021-12-11 MED ORDER — ACETAMINOPHEN 500 MG PO TABS
1000.0000 mg | ORAL_TABLET | ORAL | Status: AC
  Administered 2021-12-11: 1000 mg via ORAL
  Filled 2021-12-11 (×2): qty 2

## 2021-12-11 MED ORDER — LIDOCAINE 2% (20 MG/ML) 5 ML SYRINGE
INTRAMUSCULAR | Status: AC
Start: 2021-12-11 — End: ?
  Filled 2021-12-11: qty 5

## 2021-12-11 MED ORDER — CEFAZOLIN SODIUM-DEXTROSE 2-4 GM/100ML-% IV SOLN
2.0000 g | INTRAVENOUS | Status: AC
  Administered 2021-12-11: 2 g via INTRAVENOUS
  Filled 2021-12-11: qty 100

## 2021-12-11 MED ORDER — LIDOCAINE-EPINEPHRINE (PF) 1 %-1:200000 IJ SOLN
INTRAMUSCULAR | Status: AC
  Filled 2021-12-11: qty 30

## 2021-12-11 MED ORDER — CHLORHEXIDINE GLUCONATE 0.12 % MT SOLN
15.0000 mL | OROMUCOSAL | Status: AC
  Administered 2021-12-11: 15 mL via OROMUCOSAL
  Filled 2021-12-11 (×2): qty 15

## 2021-12-11 SURGICAL SUPPLY — 69 items
ADH SKN CLS APL DERMABOND .7 (GAUZE/BANDAGES/DRESSINGS) ×3
APL PRP STRL LF DISP 70% ISPRP (MISCELLANEOUS) ×3
APL SKNCLS STERI-STRIP NONHPOA (GAUZE/BANDAGES/DRESSINGS) ×3
BAG COUNTER SPONGE SURGICOUNT (BAG) ×4 IMPLANT
BAG SPNG CNTER NS LX DISP (BAG) ×3
BALLN MUSTANG 6X60X75 (BALLOONS) ×4
BALLOON MUSTANG 6X60X75 (BALLOONS) ×1 IMPLANT
BENZOIN TINCTURE PRP APPL 2/3 (GAUZE/BANDAGES/DRESSINGS) ×4 IMPLANT
CANISTER SUCT 3000ML PPV (MISCELLANEOUS) ×4 IMPLANT
CATH ANGIO BERNSTEIN 5X65X.035 (CATHETERS) ×2 IMPLANT
CATH BEACON 5 .035 65 KMP TIP (CATHETERS) ×2 IMPLANT
CATH CXI SUPP 2.6F 150 ANG (CATHETERS) ×2 IMPLANT
CATH OMNI FLUSH 5F 65CM (CATHETERS) ×4 IMPLANT
CATH QUICKCROSS .018X135CM (MICROCATHETER) ×2 IMPLANT
CATH QUICKCROSS SUPP .035X90CM (MICROCATHETER) ×2 IMPLANT
CHLORAPREP W/TINT 26 (MISCELLANEOUS) ×4 IMPLANT
CLOSURE PERCLOSE PROSTYLE (VASCULAR PRODUCTS) ×2 IMPLANT
DERMABOND ADVANCED (GAUZE/BANDAGES/DRESSINGS) ×1
DERMABOND ADVANCED .7 DNX12 (GAUZE/BANDAGES/DRESSINGS) ×3 IMPLANT
DEVICE TORQUE H2O (MISCELLANEOUS) ×2 IMPLANT
DRAIN CHANNEL 15F RND FF W/TCR (WOUND CARE) IMPLANT
DRAPE C-ARM 42X72 X-RAY (DRAPES) IMPLANT
DRAPE INCISE IOBAN 66X45 STRL (DRAPES) ×2 IMPLANT
EVACUATOR SILICONE 100CC (DRAIN) IMPLANT
GAUZE 4X4 16PLY ~~LOC~~+RFID DBL (SPONGE) ×2 IMPLANT
GAUZE SPONGE 4X4 12PLY STRL (GAUZE/BANDAGES/DRESSINGS) ×4 IMPLANT
GLIDEWIRE ADV .035X180CM (WIRE) ×2 IMPLANT
GLOVE SURG SS PI 8.0 STRL IVOR (GLOVE) ×4 IMPLANT
GOWN STRL REUS W/ TWL LRG LVL3 (GOWN DISPOSABLE) ×6 IMPLANT
GOWN STRL REUS W/ TWL XL LVL3 (GOWN DISPOSABLE) ×3 IMPLANT
GOWN STRL REUS W/TWL LRG LVL3 (GOWN DISPOSABLE) ×8
GOWN STRL REUS W/TWL XL LVL3 (GOWN DISPOSABLE) ×4
GUIDEWIRE ANGLED .035X150CM (WIRE) ×2 IMPLANT
KIT BASIN OR (CUSTOM PROCEDURE TRAY) ×4 IMPLANT
KIT ENCORE 26 ADVANTAGE (KITS) ×2 IMPLANT
KIT MICROPUNCTURE NIT STIFF (SHEATH) ×2 IMPLANT
KIT TURNOVER KIT B (KITS) ×4 IMPLANT
NS IRRIG 1000ML POUR BTL (IV SOLUTION) ×8 IMPLANT
PACK ENDO MINOR (CUSTOM PROCEDURE TRAY) ×4 IMPLANT
PACK UNIVERSAL I (CUSTOM PROCEDURE TRAY) ×2 IMPLANT
PAD ARMBOARD 7.5X6 YLW CONV (MISCELLANEOUS) ×8 IMPLANT
PENCIL SMOKE EVACUATOR (MISCELLANEOUS) IMPLANT
SET WALTER ACTIVATION W/DRAPE (SET/KITS/TRAYS/PACK) ×4 IMPLANT
SHEATH DESTINATION 8F 45CM (SHEATH) ×2 IMPLANT
SHEATH PINNACLE 5F 10CM (SHEATH) ×4 IMPLANT
SHEATH PINNACLE 8F 10CM (SHEATH) ×4 IMPLANT
STENT VIABAHN 5X120X77FR (Permanent Stent) ×3 IMPLANT
STENT VIABAHN 7X50X120 (Permanent Stent) ×4 IMPLANT
STENT VIABAHN 7X5X120 7FR (Permanent Stent) ×1 IMPLANT
STOPCOCK 3 WAY HIGH PRESSURE (MISCELLANEOUS) ×4
STOPCOCK 3WAY HIGH PRESSURE (MISCELLANEOUS) ×1 IMPLANT
STOPCOCK MORSE 400PSI 3WAY (MISCELLANEOUS) ×4 IMPLANT
SUT ETHILON 3 0 PS 1 (SUTURE) IMPLANT
SYR MEDRAD MARK V 150ML (SYRINGE) ×4 IMPLANT
TOWEL GREEN STERILE (TOWEL DISPOSABLE) ×8 IMPLANT
TOWEL GREEN STERILE FF (TOWEL DISPOSABLE) ×4 IMPLANT
TUBING HIGH PRESSURE 120CM (CONNECTOR) ×4 IMPLANT
TUBING INJECTOR 48 (MISCELLANEOUS) ×2 IMPLANT
UNDERPAD 30X36 HEAVY ABSORB (UNDERPADS AND DIAPERS) ×4 IMPLANT
WATER STERILE IRR 1000ML POUR (IV SOLUTION) ×4 IMPLANT
WIRE AMPLATZ SS-J .035X180CM (WIRE) IMPLANT
WIRE AMPLATZ SS-J .035X260CM (WIRE) IMPLANT
WIRE BENTSON .035X145CM (WIRE) ×4 IMPLANT
WIRE G V18X300CM (WIRE) ×8 IMPLANT
WIRE ROSEN-J .035X260CM (WIRE) ×2 IMPLANT
WIRE SHEPHERD 30G .018 (WIRE) ×2 IMPLANT
WIRE SPARTACORE .014X190CM (WIRE) ×2 IMPLANT
WIRE SPARTACORE .014X300CM (WIRE) ×2 IMPLANT
WIRE STARTER BENTSON 035X150 (WIRE) ×2 IMPLANT

## 2021-12-11 NOTE — Anesthesia Procedure Notes (Signed)
Procedure Name: MAC Date/Time: 12/11/2021 7:46 AM  Performed by: Leonor Liv, CRNAPre-anesthesia Checklist: Patient identified, Emergency Drugs available, Suction available, Patient being monitored and Timeout performed Patient Re-evaluated:Patient Re-evaluated prior to induction Oxygen Delivery Method: Nasal cannula Preoxygenation: Pre-oxygenation with 100% oxygen Placement Confirmation: positive ETCO2 Dental Injury: Teeth and Oropharynx as per pre-operative assessment

## 2021-12-11 NOTE — Discharge Instructions (Signed)
° °  [facility] ° °Discharge Instructions ° °Lower Extremity Angiogram; Angioplasty/Stenting ° °Please refer to the following instructions for your post-procedure care. Your surgeon or physician assistant will discuss any changes with you. ° °Activity ° °Avoid lifting more than 8 pounds (1 gallons of milk) for 72 hours (3 days) after your procedure. You may walk as much as you can tolerate. It's OK to drive after 72 hours. ° °Bathing/Showering ° °You may shower the day after your procedure. If you have a bandage, you may remove it at 24- 48 hours. Clean your incision site with mild soap and water. Pat the area dry with a clean towel. ° °Diet ° °Resume your pre-procedure diet. There are no special food restrictions following this procedure. All patients with peripheral vascular disease should follow a low fat/low cholesterol diet. In order to heal from your surgery, it is CRITICAL to get adequate nutrition. Your body requires vitamins, minerals, and protein. Vegetables are the best source of vitamins and minerals. Vegetables also provide the perfect balance of protein. Processed food has little nutritional value, so try to avoid this. ° °Medications ° °Resume taking all of your medications unless your doctor tells you not to. If your incision is causing pain, you may take over-the-counter pain relievers such as acetaminophen (Tylenol) ° °Follow Up ° °Follow up will be arranged at the time of your procedure. You may have an office visit scheduled or may be scheduled for surgery. Ask your surgeon if you have any questions. ° °Please call us immediately for any of the following conditions: °•Severe or worsening pain your legs or feet at rest or with walking. °•Increased pain, redness, drainage at your groin puncture site. °•Fever of 101 degrees or higher. °•If you have any mild or slow bleeding from your puncture site: lie down, apply firm constant pressure over the area with a piece of  gauze or a clean wash cloth for 30 minutes- no peeking!, call 911 right away if you are still bleeding after 30 minutes, or if the bleeding is heavy and unmanageable. ° °Reduce your risk factors of vascular disease: ° °Stop smoking. If you would like help call [facility] at 1-800-QUIT-NOW (1-800-784-8669) or [facility] at 336-586-4000. °Manage your cholesterol °Maintain a desired weight °Control your diabetes °Keep your blood pressure down ° °If you have any questions, please call the office at [phone] ° °

## 2021-12-11 NOTE — Anesthesia Procedure Notes (Signed)
Arterial Line Insertion Start/End7/13/2023 7:05 AM, 12/11/2021 7:25 AM Performed by: Leonor Liv, CRNA, CRNA  Patient location: Pre-op. Preanesthetic checklist: patient identified, IV checked, site marked, risks and benefits discussed, surgical consent, monitors and equipment checked, pre-op evaluation, timeout performed and anesthesia consent Lidocaine 1% used for infiltration Left, radial was placed Catheter size: 20 G Hand hygiene performed  and maximum sterile barriers used  Allen's test indicative of satisfactory collateral circulation Attempts: 2 Procedure performed without using ultrasound guided technique. Following insertion, dressing applied and Biopatch. Patient tolerated the procedure well with no immediate complications.

## 2021-12-11 NOTE — Interval H&P Note (Signed)
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  The various methods of treatment have been discussed with the patient and family. After consideration of risks, benefits and other options for treatment, the patient has consented to  angiogram with intervention as a surgical intervention.  The patient's history has been reviewed, patient examined, no change in status, stable for surgery.  I have reviewed the patient's chart and labs.  Questions were answered to the patient's satisfaction.     Cherre Robins

## 2021-12-11 NOTE — Transfer of Care (Signed)
Immediate Anesthesia Transfer of Care Note  Patient: [name]  Procedure(s) Performed: LOWER EXTREMITY ANGIOGRAM WITH COMMON FEMORAL ARTERY AND PROFUNDA  ARTERY STENTING ULTRASOUND GUIDANCE FOR VASCULAR ACCESS (Left: Groin)  Patient Location: PACU  Anesthesia Type:MAC  Level of Consciousness: awake and confused  Airway & Oxygen Therapy: Patient Spontanous Breathing and Patient connected to nasal cannula oxygen  Post-op Assessment: Report given to RN, Post -op Vital signs reviewed and stable and Patient moving all extremities X 4  Post vital signs: Reviewed and stable  Last Vitals:  Vitals Value Taken Time  BP 146/65 12/11/21 1033  Temp    Pulse 65 12/11/21 1035  Resp 16 12/11/21 1035  SpO2 90 % 12/11/21 1035  Vitals shown include unvalidated device data.  Last Pain:  Vitals:   12/11/21 0625  PainSc: 0-No pain      Patients Stated Pain Goal: 0 (45/85/92 9244)  Complications: No notable events documented.

## 2021-12-11 NOTE — Op Note (Signed)
DATE OF SERVICE: 12/11/2021  PATIENT:  [name]  88 y.o. male  PRE-OPERATIVE DIAGNOSIS:  Atherosclerosis of native arteries of right lower extremity causing ulceration  POST-OPERATIVE DIAGNOSIS:  Same  PROCEDURE:   1) Ultrasound guided left common femoral artery access 2) Aortogram 3) Right lower extremity angiogram with third order cannulation  4) Right common femoral and profunda femoris artery angioplasty and stenting (57m x 544mViabahn)  SURGEON:  ThYevonne AlineHaStanford BreedMD  ASSISTANT: none  ANESTHESIA:   local and MAC  ESTIMATED BLOOD LOSS: minimal  LOCAL MEDICATIONS USED:  LIDOCAINE   COUNTS: confirmed correct.  PATIENT DISPOSITION:  PACU   Delay start of Pharmacological VTE agent (>24hrs) due to surgical blood loss or risk of bleeding: no  INDICATION FOR PROCEDURE: FrKERON NEENANs a [age].o. male with right leg pretibial ulceration with severe peripheral arterial disease on non-invasive testing. He is a very high risk operative candidate and was quoted a 15% risk of death after general anesthetic by his cardiologist. After careful discussion of risks, benefits, and alternatives the patient was offered angiography with possible common femoral artery stenting. The patient understood and wished to proceed.  OPERATIVE FINDINGS:  Terminal aorta and iliac arteries: Common iliac stents widely patent  Right lower extremity: Common femoral artery: heavily diseased with bulky exophytic plaque  Profunda femoris artery: occluded at the origin, reconstitutes into normal artery immediately distal to occlusion.   Superficial femoral artery: critical stenosis with bulky, calcific disease about the origin Popliteal artery: occluded above the knee; reconstitutes behind and below the knee Anterior tibial artery: patent at its origin; occludes in mid calf Tibioperoneal trunk: patent Peroneal artery: patent, courses to ankle Posterior tibial artery: patent, courses to ankle  GLASS  score. FP 4 / IP 1 / P 0  WIfI score. 2 / 3 / 0. Grade IV disease. High risk of amputation. High benefit from revascularization.  DESCRIPTION OF PROCEDURE: After identification of the patient in the pre-operative holding area, the patient was transferred to the operating room. The patient was positioned supine on the operating room table. Anesthesia was induced. The groins was prepped and draped in standard fashion. A surgical pause was performed confirming correct patient, procedure, and operative location.  The left groin was anesthetized with subcutaneous injection of 1% lidocaine. Using ultrasound guidance, the left common femoral artery was accessed with micropuncture technique. Fluoroscopy was used to confirm cannulation over the femoral head. The 475F sheath was upsized to 6F.   A Benson wire was advanced into the distal aorta. Over the wire an omni flush catheter was advanced to the level of L2. Aortogram was performed - see above for details.   The right common iliac artery was selected with an omniflush catheter and glidewire guidewire. The wire was advanced into the common femoral artery. Over the wire the omni flush catheter was advanced into the external iliac artery. Selective angiography was performed - see above for details.   The decision was made to intervene. The patient was heparinized with 8,000 units of heparin. The 6F sheath was exchanged for a 75F x 45cm sheath. Selective angiography of the left lower extremity was performed prior to intervention.   It was extremely difficult to cross the common femoral/profunda femoris occlusion.  I was able to do this and reenter the true lumen after some effort.  Catheter angiography confirmed true lumen position.  I left a Rosen wire in place in the profunda femoris and turned attention to the  superficial femoral artery.  Using buddy wire technique and introduced an 018 system through the 8 French sheath and intact the superficial femoral  artery occlusion.  I was able to enter a subintimal plane and carried this down into the above-knee popliteal artery.  Through a series of wire and catheter exchanges I was not able to reenter the true lumen.  I elected to abandon the superficial femoral artery approach as this was functionally occluded and elected to treat the common femoral into the profunda femoris stenosis to improve the flow into his right leg.  Over the [facility] wire, I introduced a 7 x 50 mm Viabahn stent.  This was deployed across the area of bulky plaque into normal profunda.  The stent was deployed in normal fashion.  The stent was then angioplastied with a 6 mm Mustang balloon.  Postintervention angiogram showed brisk flow into the profunda femoris artery through the common femoral artery which had limited residual stenosis proximal to the stent.  The runoff was then imaged.  The superficial femoral artery was noted to be occluded.  This is not a big surprise given her findings.  The popliteal artery reconstituted behind the knee and flowed inline to the anterior tibial artery, tibioperoneal trunk.  The peroneal and posterior tibial artery was the dominant tibial vessels and course to the ankle.  We ended the case here.  A Perclose device was used to close the arteriotomy. Hemostasis was excellent upon completion.  Upon completion of the case instrument and sharps counts were confirmed correct. The patient was transferred to the PACU in good condition. I was present for all portions of the procedure.  PLAN: Continue aspirin, Plavix, statin therapies.  Patient has extreme cardiac risk for open reconstruction.  I am hopeful that this indirect revascularization will be enough to get him to heal.  I will see him again in 4 weeks with an ankle-brachial index.  Yevonne Aline. Stanford Breed, MD [facility] Phone Number: [phone] 12/11/2021 10:15 AM

## 2021-12-12 NOTE — Addendum Note (Signed)
Addendum  created 12/12/21 1330 by Nolon Nations, MD   SmartForm saved

## 2021-12-12 NOTE — Anesthesia Postprocedure Evaluation (Signed)
Anesthesia Post Note  Patient: Shimon Trowbridge Och  Procedure(s) Performed: LOWER EXTREMITY ANGIOGRAM WITH COMMON FEMORAL ARTERY AND PROFUNDA  ARTERY STENTING ULTRASOUND GUIDANCE FOR VASCULAR ACCESS (Left: Groin)     Patient location during evaluation: PACU Anesthesia Type: MAC Level of consciousness: awake and alert Pain management: pain level controlled Vital Signs Assessment: post-procedure vital signs reviewed and stable Respiratory status: spontaneous breathing Cardiovascular status: stable Anesthetic complications: no   No notable events documented.  Last Vitals:  Vitals:   12/11/21 1229 12/11/21 1230  BP:  (!) 147/53  Pulse: (!) 49   Resp:    Temp:    SpO2: 96%     Last Pain:  Vitals:   12/11/21 1115  PainSc: 0-No pain                 Nolon Nations

## 2021-12-15 ENCOUNTER — Encounter (HOSPITAL_COMMUNITY): Payer: Self-pay | Admitting: Vascular Surgery

## 2021-12-16 ENCOUNTER — Encounter (HOSPITAL_BASED_OUTPATIENT_CLINIC_OR_DEPARTMENT_OTHER): Payer: Medicare Other | Admitting: Internal Medicine

## 2021-12-16 DIAGNOSIS — I87311 Chronic venous hypertension (idiopathic) with ulcer of right lower extremity: Secondary | ICD-10-CM | POA: Insufficient documentation

## 2021-12-16 DIAGNOSIS — L97818 Non-pressure chronic ulcer of other part of right lower leg with other specified severity: Secondary | ICD-10-CM | POA: Diagnosis not present

## 2021-12-16 DIAGNOSIS — S8011XD Contusion of right lower leg, subsequent encounter: Secondary | ICD-10-CM | POA: Insufficient documentation

## 2021-12-16 DIAGNOSIS — X58XXXD Exposure to other specified factors, subsequent encounter: Secondary | ICD-10-CM | POA: Insufficient documentation

## 2021-12-16 DIAGNOSIS — I70238 Atherosclerosis of native arteries of right leg with ulceration of other part of lower right leg: Secondary | ICD-10-CM | POA: Diagnosis not present

## 2021-12-17 NOTE — Progress Notes (Signed)
[name], Shea ([id number]) Visit Report for 12/16/2021 Arrival Information Details Patient Name: Date of Service: [name] 12/16/2021 1:30 PM Medical Record Number: [id number] Patient Account Number: [id number] Date of Birth/Sex: Treating RN: [birth year] (88 y.o. [name] Primary Care [name]: [name] Other Clinician: Referring [name]: Treating [name]/Extender: [name] in Treatment: 11 Visit Information History Since Last Visit Added or deleted any medications: No Patient Arrived: [name] Any new allergies or adverse reactions: No Arrival Time: 13:39 Had a fall or experienced change in No Accompanied By: daughter activities of daily living that may affect Transfer Assistance: None risk of falls: Patient Identification Verified: Yes Signs or symptoms of abuse/neglect since last visito No Secondary Verification Process Completed: Yes Hospitalized since last visit: No Patient Requires Transmission-Based Precautions: No Implantable device outside of the clinic excluding No Patient Has Alerts: Yes cellular tissue based products placed in the center Patient Alerts: [id number] to obtain;pain since last visit: Has Dressing in Place as Prescribed: Yes Pain Present Now: No Notes per daughter patient had procedure-stenting right leg. Per daughter cardiology explained to vascular before procedure patient would not do well with the extensive right femoral entoarterectomy- plan for stenting instead. [name]. Electronic Signature(s) Signed: 12/17/2021 4:20:13 PM By: Jonathon Pilling RN, BSN Entered By: [name] on 12/16/2021 13:41:40 -------------------------------------------------------------------------------- Encounter Discharge Information Details Patient Name: Date of Service: Jonathon Mallet. 12/16/2021 1:30 PM Medical Record Number: [id number] Patient Account Number: [id number] Date of Birth/Sex: Treating  RN: 12-Dec-1933 (88 y.o. [name] Primary Care [name]: [name] Other Clinician: Referring [name]: Treating [name]/Extender: [name] in Treatment: 11 Encounter Discharge Information Items Post Procedure Vitals Discharge Condition: Stable Temperature (F): 97.9 Ambulatory Status: Cane Pulse (bpm): 65 Discharge Destination: Home Respiratory Rate (breaths/min): 20 Transportation: Private Auto Blood Pressure (mmHg): 131/72 Accompanied By: daughter Schedule Follow-up Appointment: Yes Clinical Summary of Care: Electronic Signature(s) Signed: 12/17/2021 4:20:13 PM By: Jonathon Pilling RN, BSN Entered By: [name] on 12/16/2021 13:59:58 -------------------------------------------------------------------------------- Lower Extremity Assessment Details Patient Name: Date of Service: Jonathon Mallet. 12/16/2021 1:30 PM Medical Record Number: [id number] Patient Account Number: [id number] Date of Birth/Sex: Treating RN: 1933-09-23 (88 y.o. [name] Primary Care [name]: [name] Other Clinician: Referring [name]: Treating [name]/Extender: [name] in Treatment: 11 Edema Assessment Assessed: [name]: No] [name]: Yes] Edema: [Left: Ye] [Right: s] Calf Left: Right: Point of Measurement: 31 cm From Medial Instep 31 cm Ankle Left: Right: Point of Measurement: 7 cm From Medial Instep 24.5 cm Vascular Assessment Pulses: Dorsalis Pedis Palpable: [Right:Yes] Electronic Signature(s) Signed: 12/17/2021 4:20:13 PM By: Jonathon Pilling RN, BSN Entered By: [name] on 12/16/2021 13:43:16 -------------------------------------------------------------------------------- Multi Wound Chart Details Patient Name: Date of Service: Jonathon Mallet. 12/16/2021 1:30 PM Medical Record Number: [id number] Patient Account Number: [id number] Date of Birth/Sex: Treating RN: December 18, 1933 (88 y.o. [name] Primary Care Hatice Bubel: [name] Other Clinician: Referring Caylynn Minchew: Treating [name]/Extender: [name] in Treatment: 11 Vital Signs Height(in): 31 Pulse(bpm): 68 Weight(lbs): 168 Blood Pressure(mmHg): 131/72 Body Mass Index(BMI): 24.8 Temperature(F): 97.9 Respiratory Rate(breaths/min): 20 Photos: [1:Right, Anterior Lower Leg] [N/A:N/A N/A] Wound Location: [1:Trauma] [N/A:N/A] Wounding Event: [1:Abrasion] [N/A:N/A] Primary Etiology: [1:Diabetic Wound/Ulcer of the Lower] [N/A:N/A] Secondary Etiology: [1:Extremity Cataracts, Anemia, Asthma, Chronic N/A] Comorbid History: [1:Obstructive Pulmonary Disease (COPD), Congestive Heart Failure, Coronary Artery Disease, Hypertension, Myocardial Infarction, Peripheral Arterial Disease, Type II Diabetes 07/02/2021] [N/A:N/A] Date Acquired: [1:11] [N/A:N/A] Weeks of Treatment: [1:Open] [N/A:N/A]  Wound Status: [1:No] [N/A:N/A] Wound Recurrence: [1:3x1.9x0.2] [N/A:N/A] Measurements L x W x D (cm) [1:4.477] [N/A:N/A] A (cm) : rea [1:0.895] [N/A:N/A] Volume (cm) : [1:-24.20%] [N/A:N/A] % Reduction in Area: [1:37.90%] [N/A:N/A] % Reduction in Volume: [1:Full Thickness Without Exposed] [N/A:N/A] Classification: [1:Support Structures Medium] [N/A:N/A] Exudate Amount: [1:Serosanguineous] [N/A:N/A] Exudate Type: [1:red, brown] [N/A:N/A] Exudate Color: [1:Epibole] [N/A:N/A] Wound Margin: [1:Large (67-100%)] [N/A:N/A] Granulation Amount: [1:Red, Pink] [N/A:N/A] Granulation Quality: [1:Small (1-33%)] [N/A:N/A] Necrotic Amount: [1:Fat Layer (Subcutaneous Tissue): Yes N/A] Exposed Structures: [1:Fascia: No Tendon: No Muscle: No Joint: No Bone: No Medium (34-66%)] [N/A:N/A] Treatment Notes Electronic Signature(s) Signed: 12/16/2021 3:44:08 PM By: Jonathon Shan DO Signed: 12/17/2021 4:20:13 PM By: Jonathon Pilling RN, BSN Entered By: [name] on 12/16/2021  13:58:30 -------------------------------------------------------------------------------- Multi-Disciplinary Care Plan Details Patient Name: Date of Service: Jonathon Mallet. 12/16/2021 1:30 PM Medical Record Number: [id number] Patient Account Number: [id number] Date of Birth/Sex: Treating RN: 03/24/1934 (88 y.o. [name] Primary Care Ja Ohman: [name] Other Clinician: Referring Tanequa Kretz: Treating Sharifah Champine/Extender: [name] in Treatment: 11 Active Inactive Tissue Oxygenation Nursing Diagnoses: Potential alteration in peripheral tissue perfusion (select prior to confirmation of diagnosis) Goals: Non-invasive arterial studies are completed as ordered Date Initiated: 10/14/2021 Target Resolution Date: 12/26/2021 Goal Status: Active Interventions: Assess patient understanding of disease process and management upon diagnosis and as needed Assess peripheral arterial status upon admission and as needed Provide education on tissue oxygenation and ischemia Treatment Activities: Ankle Brachial Index (ABI) : 09/30/2021 Non-invasive vascular studies : 10/14/2021 Notes: Wound/Skin Impairment Nursing Diagnoses: Knowledge deficit related to ulceration/compromised skin integrity Goals: Patient/caregiver will verbalize understanding of skin care regimen Date Initiated: 09/26/2021 Target Resolution Date: 12/26/2021 Goal Status: Active Interventions: Assess patient/caregiver ability to perform ulcer/skin care regimen upon admission and as needed Assess ulceration(s) every visit Provide education on ulcer and skin care Treatment Activities: Skin care regimen initiated : 09/26/2021 Topical wound management initiated : 09/26/2021 Notes: 10/21/21: Wound care regimen continues, using [employer] and [employer]. Electronic Signature(s) Signed: 12/17/2021 4:20:13 PM By: Jonathon Pilling RN, BSN Entered By: [name] on 12/16/2021  13:46:45 -------------------------------------------------------------------------------- Pain Assessment Details Patient Name: Date of Service: Jonathon Mallet. 12/16/2021 1:30 PM Medical Record Number: [id number] Patient Account Number: [id number] Date of Birth/Sex: Treating RN: 09-14-1933 (88 y.o. [name] Primary Care Milca Sytsma: [name] Other Clinician: Referring Milburn Freeney: Treating Dmoni Fortson/Extender: [name] in Treatment: 11 Active Problems Location of Pain Severity and Description of Pain Patient Has Paino No Site Locations Rate the pain. Current Pain Level: 0 Pain Management and Medication Current Pain Management: Medication: No Cold Application: No Rest: No Massage: No Activity: No T.E.N.S.: No Heat Application: No Leg drop or elevation: No Is the Current Pain Management Adequate: Adequate How does your wound impact your activities of daily livingo Sleep: No Bathing: No Appetite: No Relationship With Others: No Bladder Continence: No Emotions: No Bowel Continence: No Work: No Toileting: No Drive: No Dressing: No Hobbies: No Engineer, maintenance) Signed: 12/17/2021 4:20:13 PM By: Jonathon Pilling RN, BSN Entered By: [name] on 12/16/2021 13:42:02 -------------------------------------------------------------------------------- Patient/Caregiver Education Details Patient Name: Date of Service: Jonathon Mallet 7/18/2023andnbsp1:30 PM Medical Record Number: [id number] Patient Account Number: [id number] Date of Birth/Gender: Treating RN: January 29, 1934 (88 y.o. [name] Primary Care Physician: [name] Other Clinician: Referring Physician: Treating Physician/Extender: [name] in Treatment: 11 Education Assessment Education Provided To: Patient Education Topics Provided Wound/Skin Impairment: Handouts: Skin Care Do's and Dont's Methods: Explain/Verbal Responses:  Reinforcements needed Electronic Signature(s) Signed: 12/17/2021 4:20:13 PM By: Jonathon Pilling RN, BSN Entered By: [name] on 12/16/2021 13:46:55 -------------------------------------------------------------------------------- Wound Assessment Details Patient Name: Date of Service: Jonathon Mallet. 12/16/2021 1:30 PM Medical Record Number: [id number] Patient Account Number: [id number] Date of Birth/Sex: Treating RN: 09-24-1933 (88 y.o. [name], Meta.Reding Primary Care Mariadelcarmen Corella: [name] Other Clinician: Referring Lavren Lewan: Treating Spruha Weight/Extender: [name] in Treatment: 11 Wound Status Wound Number: 1 Primary Abrasion Etiology: Wound Location: Right, Anterior Lower Leg Secondary Diabetic Wound/Ulcer of the Lower Extremity Wounding Event: Trauma Wounding Event: Trauma Etiology: Date Acquired: 07/02/2021 Wound Open Weeks Of Treatment: 11 Status: Clustered Wound: No Comorbid Cataracts, Anemia, Asthma, Chronic Obstructive Pulmonary History: Disease (COPD), Congestive Heart Failure, Coronary Artery Disease, Hypertension, Myocardial Infarction, Peripheral Arterial Disease, Type II Diabetes Photos Wound Measurements Length: (cm) 3 Width: (cm) 1.9 Depth: (cm) 0.2 Area: (cm) 4.477 Volume: (cm) 0.895 % Reduction in Area: -24.2% % Reduction in Volume: 37.9% Epithelialization: Medium (34-66%) Tunneling: No Undermining: No Wound Description Classification: Full Thickness Without Exposed Support Structures Wound Margin: Epibole Exudate Amount: Medium Exudate Type: Serosanguineous Exudate Color: red, brown Foul Odor After Cleansing: No Slough/Fibrino Yes Wound Bed Granulation Amount: Large (67-100%) Exposed Structure Granulation Quality: Red, Pink Fascia Exposed: No Necrotic Amount: Small (1-33%) Fat Layer (Subcutaneous Tissue) Exposed: Yes Necrotic Quality: Adherent Slough Tendon Exposed: No Muscle Exposed: No Joint Exposed:  No Bone Exposed: No Treatment Notes Wound #1 (Lower Leg) Wound Laterality: Right, Anterior Cleanser Soap and Water Discharge Instruction: May shower and wash wound with dial antibacterial soap and water prior to dressing change. Wound Cleanser Discharge Instruction: Cleanse the wound with wound cleanser prior to applying a clean dressing using gauze sponges, not tissue or cotton balls. Peri-Wound Care Skin Prep Discharge Instruction: Use skin prep as directed Sween Lotion (Moisturizing lotion) Discharge Instruction: Apply moisturizing lotion as directed Topical [employer] compounding topical antibiotics Discharge Instruction: apply daily directly to wound bed. Primary Dressing Hydrofera Blue Ready Foam, 4x5 in Discharge Instruction: Apply to wound bed as instructed Secondary Dressing Zetuvit Plus Silicone Border Dressing 4x4 (in/in) Discharge Instruction: Apply silicone border over primary dressing as directed. Secured With Compression Wrap Compression Stockings Environmental education officer) Signed: 12/17/2021 4:20:13 PM By: Jonathon Pilling RN, BSN Entered By: [name] on 12/16/2021 13:45:40 -------------------------------------------------------------------------------- Vitals Details Patient Name: Date of Service: Jonathon Mallet. 12/16/2021 1:30 PM Medical Record Number: [id number] Patient Account Number: [id number] Date of Birth/Sex: Treating RN: 1933/12/25 (88 y.o. [name], [name] Primary Care Sophie Tamez: [name] Other Clinician: Referring Simra Fiebig: Treating Charlese Gruetzmacher/Extender: [name] in Treatment: 11 Vital Signs Time Taken: 13:38 Temperature (F): 97.9 Height (in): 69 Pulse (bpm): 65 Weight (lbs): 168 Respiratory Rate (breaths/min): 20 Body Mass Index (BMI): 24.8 Blood Pressure (mmHg): 131/72 Reference Range: 80 - 120 mg / dl Electronic Signature(s) Signed: 12/17/2021 4:20:13 PM By: Jonathon Pilling RN, BSN Entered By:  [name] on 12/16/2021 13:41:53

## 2021-12-17 NOTE — Progress Notes (Signed)
[name], [name] ([id number]) Visit Report for 12/16/2021 Chief Complaint Document Details Patient Name: Date of Service: [name] 12/16/2021 1:30 PM Medical Record Number: [id number] Patient Account Number: [id number] Date of Birth/Sex: Treating RN: March 01, 1934 (88 y.o. [name] Primary Care Provider: [name] Other Clinician: Referring Provider: Treating Provider/Extender: [name] in Treatment: 11 Information Obtained from: Patient Chief Complaint 09/26/2021; patient is here for a review of a complicated wound on the right anterior lower leg which was initially secondary to trauma Electronic Signature(s) Signed: 12/16/2021 3:44:08 PM By: Kalman Shan DO Entered By: Kalman Shan on 12/16/2021 13:58:49 -------------------------------------------------------------------------------- Debridement Details Patient Name: Date of Service: [name]. 12/16/2021 1:30 PM Medical Record Number: [id number] Patient Account Number: [id number] Date of Birth/Sex: Treating RN: [birth year] (88 y.o. [name], Meta.Reding Primary Care Provider: [name] Other Clinician: Referring Provider: Treating Provider/Extender: [name] in Treatment: 11 Debridement Performed for Assessment: Wound #1 Right,Anterior Lower Leg Performed By: Physician Kalman Shan, DO Debridement Type: Debridement Severity of Tissue Pre Debridement: Fat layer exposed Level of Consciousness (Pre-procedure): Awake and Alert Pre-procedure Verification/Time Out Yes - 13:45 Taken: Start Time: 13:46 Pain Control: Lidocaine 4% T opical Solution T Area Debrided (L x W): otal 3 (cm) x 1.5 (cm) = 4.5 (cm) Tissue and other material debrided: Viable, Non-Viable, Slough, Subcutaneous, Skin: Dermis , Skin: Epidermis, Fibrin/Exudate, Slough Level: Skin/Subcutaneous Tissue Debridement Description: Excisional Instrument: Curette Bleeding:  Minimum Hemostasis Achieved: Pressure End Time: 13:58 Procedural Pain: 0 Post Procedural Pain: 0 Response to Treatment: Procedure was tolerated well Level of Consciousness (Post- Awake and Alert procedure): Post Debridement Measurements of Total Wound Length: (cm) 3 Width: (cm) 1.9 Depth: (cm) 0.2 Volume: (cm) 0.895 Character of Wound/Ulcer Post Debridement: Requires Further Debridement Severity of Tissue Post Debridement: Fat layer exposed Post Procedure Diagnosis Same as Pre-procedure Electronic Signature(s) Signed: 12/16/2021 3:44:08 PM By: Kalman Shan DO Signed: 12/17/2021 4:20:13 PM By: Deon Pilling RN, BSN Entered By: Deon Pilling on 12/16/2021 13:59:11 -------------------------------------------------------------------------------- HPI Details Patient Name: Date of Service: [name]. 12/16/2021 1:30 PM Medical Record Number: [id number] Patient Account Number: [id number] Date of Birth/Sex: Treating RN: 05-Oct-1933 (88 y.o. [name] Primary Care Provider: [name] Other Clinician: Referring Provider: Treating Provider/Extender: [name] in Treatment: 11 History of Present Illness HPI Description: ADMISSION 09/26/21 This is a 88 year old man who lives near [place]. He is accompanied by his daughter who lives with him. His wife is also at home. Apparently 2 months ago he traumatized the right anterior lower leg x2 in quick succession including once on the back of a pickup truck or car. By description it sounds as though he had a black eschar on this which fell off about a month ago. He has been left with an open wound. It this is not that large but is completely slough covered and has undermining of at least a centimeter and half from 12-6 o'clock. He received a course of antibiotics earlier this month from his primary care doctor Ceftin. He is not on antibiotics currently. The patient has a complicated relevant history.  2019 he underwent bilateral common iliac artery stenting and angioplasties by Dr. Carlis Abbott. He had ABIs at the same time which showed a ABI at 0.55 on the right and a TBI of 0.38. He had a TBI of 0.43 on the left they could not do an ABI because he could not tolerate the pressure cuff. He has not followed up. The  patient is apparently a diabetic although he has not been on any treatment in years and its not clear that he is even followed up. Other past medical history includes coronary artery disease, ischemic cardiomyopathy status post pacemaker, he is on Plavix, He did not cooperate with our attempt to do ABIs on the right leg in our clinic 5/5; patient presents for follow-up. Last week he was started on Iodosorb under Kerlix/Coban. He has no issues or complaints today. He has his venous and arterial studies scheduled for next week. He currently denies signs of infection. 5/16; patient presents for follow-up. Last week he was started on Hydrofera Blue and gentamicin and mupirocin ointment under Kerlix/Coban. He tolerated this well. He had a PCR culture that reported high levels of Streptococcus agalactiae, Pseudomonas aeruginosa and Enterococcus bacillus. Keystone antibiotics were ordered and he has this however did not bring it today. He had ABIs with TBI's and venous reflux studies. He had evidence of reflux throughout the right and left venous system. His ABIs on the right was noncompressible with a TBI of 0.19. He currently denies systemic signs of infection. 5/23; patient presents for follow-up. He has been using Keystone antibiotic to the wound bed without issues. He currently denies signs of infection. He has not heard back from Dr. Kennon Holter office. 6/8; patient is back his wound is somewhat better with less undermining but still the same basic surface area. The wound surface looks better in terms of nonviable material. But the other issue that concerns me is this patient probably has some minimal  distance claudication and may even have claudication at rest at night in the right leg. Referencing my notes from his admission he has had previous bilateral common iliac artery stenting and angioplasties in 2019. For some reason I do not know that he ever had any follow-up. 6/19; patient presents for follow-up. He saw Dr. Luan Pulling on 6/16 that recommended a CT angiogram to evaluate iliac stents and right common femoral disease. Patient is using Keystone antibiotic with [facility] without issues. He denies signs of infection. 6/26; patient presents for follow-up. He had a CT angiogram that showed a patent common iliac artery. There is advanced right femoral popliteal disease including SFA stenosis with plaque extending into the SFA and profunda femoris. 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 He has an appointment scheduled tomorrow with Dr. Roselie Awkward to review these results. 7/10; patient presents for follow-up. He is scheduled for right femoral endarterectomy on 7/13 with Dr. Roselie Awkward. He has developed a smaller wound more proximal to the original wound. He is not sure how this started. He has been using Keystone antibiotic and Hydrofera Blue to the wound bed. He denies signs of infection. 7/18; patient presents for follow-up. On 7/13 he had a right lower extremity angiogram with right common femoral and profunda femoris artery angioplasty and stenting. The previous plan for femoral endarterectomy was canceled. He has been using Keystone and [facility] to the wound bed. He currently denies signs of infection. Electronic Signature(s) Signed: 12/16/2021 3:44:08 PM By: Kalman Shan DO Entered By: Kalman Shan on 12/16/2021 14:00:04 -------------------------------------------------------------------------------- Physical Exam Details Patient Name: Date of Service: [name]. 12/16/2021 1:30 PM Medical Record Number:  [id number] Patient Account Number: [id number] Date of Birth/Sex: Treating RN: Jan 16, 1934 (88 y.o. [name] Primary Care Provider: [name] Other Clinician: Referring Provider: Treating Provider/Extender: [name] in Treatment: 11 Constitutional respirations regular, non-labored and within  target range for patient.. Cardiovascular 2+ dorsalis pedis/posterior tibialis pulses. Psychiatric pleasant and cooperative. Notes Right lower extremity: T the mid tibial aspect there is an open wound with granulation tissue and nonviable tissue. No signs of surrounding infection. o Electronic Signature(s) Signed: 12/16/2021 3:44:08 PM By: Kalman Shan DO Entered By: Kalman Shan on 12/16/2021 14:00:31 -------------------------------------------------------------------------------- Physician Orders Details Patient Name: Date of Service: [name]. 12/16/2021 1:30 PM Medical Record Number: [id number] Patient Account Number: [id number] Date of Birth/Sex: Treating RN: March 08, 1934 (88 y.o. [name] Primary Care Provider: [name] Other Clinician: Referring Provider: Treating Provider/Extender: [name] in Treatment: 11 Verbal / Phone Orders: No Diagnosis Coding Follow-up Appointments ppointment in 1 week. - Dr. Heber [place] [facility] [place], Room 8 12/22/2021 215pm Monday Return A ppointment in 2 weeks. - Dr. Heber [place] and [place], Room 8 12/29/2021 215pm Monday Return A Other: - Bring keystone topical antibiotics in at each visit. Ensure to follow up with Dr. Mora [facility] on 01/09/2022 and 01/13/2022 Bathing/ Shower/ Hygiene May shower with protection but do not get wound dressing(s) wet. Edema Control - Lymphedema / SCD / Other Elevate legs to the level of the heart or above for 30 minutes daily and/or when sitting, a frequency of: - 3-4 times a day throughout the day. Avoid standing for long periods of time. Exercise  regularly Wound Treatment Wound #1 - Lower Leg Wound Laterality: Right, Anterior Cleanser: Soap and Water 1 x Per Day/30 Days Discharge Instructions: May shower and wash wound with dial antibacterial soap and water prior to dressing change. Cleanser: Wound Cleanser (Generic) 1 x Per Day/30 Days Discharge Instructions: Cleanse the wound with wound cleanser prior to applying a clean dressing using gauze sponges, not tissue or cotton balls. Peri-Wound Care: Skin Prep (Generic) 1 x Per Day/30 Days Discharge Instructions: Use skin prep as directed Peri-Wound Care: Sween Lotion (Moisturizing lotion) 1 x Per Day/30 Days Discharge Instructions: Apply moisturizing lotion as directed Topical: Keystone compounding topical antibiotics 1 x Per Day/30 Days Discharge Instructions: apply daily directly to wound bed. Prim Dressing: Hydrofera Blue Ready Foam, 4x5 in 1 x Per Day/30 Days ary Discharge Instructions: Apply to wound bed as instructed Secondary Dressing: Zetuvit Plus Silicone Border Dressing 4x4 (in/in) (Generic) 1 x Per Day/30 Days Discharge Instructions: Apply silicone border over primary dressing as directed. Electronic Signature(s) Signed: 12/16/2021 3:44:08 PM By: Kalman Shan DO Entered By: Kalman Shan on 12/16/2021 14:00:39 -------------------------------------------------------------------------------- Problem List Details Patient Name: Date of Service: [name]. 12/16/2021 1:30 PM Medical Record Number: [id number] Patient Account Number: [id number] Date of Birth/Sex: Treating RN: November 09, 1933 (88 y.o. [name], Meta.Reding Primary Care Provider: [name] Other Clinician: Referring Provider: Treating Provider/Extender: [name] in Treatment: 11 Active Problems ICD-10 Encounter Code Description Active Date MDM Diagnosis S80.11XD Contusion of right lower leg, subsequent encounter 09/26/2021 No Yes L97.818 Non-pressure chronic ulcer of  other part of right lower leg with other specified 09/26/2021 No Yes severity I87.311 Chronic venous hypertension (idiopathic) with ulcer of right lower extremity 09/26/2021 No Yes I70.238 Atherosclerosis of native arteries of right leg with ulceration of other part of 10/14/2021 No Yes lower leg Inactive Problems Resolved Problems Electronic Signature(s) Signed: 12/16/2021 3:44:08 PM By: Kalman Shan DO Entered By: Kalman Shan on 12/16/2021 13:58:23 -------------------------------------------------------------------------------- Progress Note Details Patient Name: Date of Service: [name]. 12/16/2021 1:30 PM Medical Record Number: [id number] Patient Account Number: [id number] Date of Birth/Sex: Treating RN: 1933/10/27 (88 y.o. [name], [name] Primary  Care Provider: [name] Other Clinician: Referring Provider: Treating Provider/Extender: [name] in Treatment: 11 Subjective Chief Complaint Information obtained from Patient 09/26/2021; patient is here for a review of a complicated wound on the right anterior lower leg which was initially secondary to trauma History of Present Illness (HPI) ADMISSION 09/26/21 This is a 88 year old man who lives near [place]. He is accompanied by his daughter who lives with him. His wife is also at home. Apparently 2 months ago he traumatized the right anterior lower leg x2 in quick succession including once on the back of a pickup truck or car. By description it sounds as though he had a black eschar on this which fell off about a month ago. He has been left with an open wound. It this is not that large but is completely slough covered and has undermining of at least a centimeter and half from 12-6 o'clock. He received a course of antibiotics earlier this month from his primary care doctor Ceftin. He is not on antibiotics currently. The patient has a complicated relevant history. 2019 he underwent bilateral  common iliac artery stenting and angioplasties by Dr. Carlis Abbott. He had ABIs at the same time which showed a ABI at 0.55 on the right and a TBI of 0.38. He had a TBI of 0.43 on the left they could not do an ABI because he could not tolerate the pressure cuff. He has not followed up. The patient is apparently a diabetic although he has not been on any treatment in years and its not clear that he is even followed up. Other past medical history includes coronary artery disease, ischemic cardiomyopathy status post pacemaker, he is on Plavix, He did not cooperate with our attempt to do ABIs on the right leg in our clinic 5/5; patient presents for follow-up. Last week he was started on Iodosorb under Kerlix/Coban. He has no issues or complaints today. He has his venous and arterial studies scheduled for next week. He currently denies signs of infection. 5/16; patient presents for follow-up. Last week he was started on Hydrofera Blue and gentamicin and mupirocin ointment under Kerlix/Coban. He tolerated this well. He had a PCR culture that reported high levels of Streptococcus agalactiae, Pseudomonas aeruginosa and Enterococcus bacillus. Keystone antibiotics were ordered and he has this however did not bring it today. He had ABIs with TBI's and venous reflux studies. He had evidence of reflux throughout the right and left venous system. His ABIs on the right was noncompressible with a TBI of 0.19. He currently denies systemic signs of infection. 5/23; patient presents for follow-up. He has been using Keystone antibiotic to the wound bed without issues. He currently denies signs of infection. He has not heard back from Dr. Kennon Holter office. 6/8; patient is back his wound is somewhat better with less undermining but still the same basic surface area. The wound surface looks better in terms of nonviable material. But the other issue that concerns me is this patient probably has some minimal distance claudication and  may even have claudication at rest at night in the right leg. Referencing my notes from his admission he has had previous bilateral common iliac artery stenting and angioplasties in 2019. For some reason I do not know that he ever had any follow-up. 6/19; patient presents for follow-up. He saw Dr. Luan Pulling on 6/16 that recommended a CT angiogram to evaluate iliac stents and right common femoral disease. Patient is using Keystone antibiotic with [facility] without issues.  He denies signs of infection. 6/26; patient presents for follow-up. He had a CT angiogram that showed a patent common iliac artery. There is advanced right femoral popliteal disease including SFA stenosis with plaque extending into the SFA and profunda femoris. 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 He has an appointment scheduled tomorrow with Dr. Roselie Awkward to review these results. 7/10; patient presents for follow-up. He is scheduled for right femoral endarterectomy on 7/13 with Dr. Roselie Awkward. He has developed a smaller wound more proximal to the original wound. He is not sure how this started. He has been using Keystone antibiotic and Hydrofera Blue to the wound bed. He denies signs of infection. 7/18; patient presents for follow-up. On 7/13 he had a right lower extremity angiogram with right common femoral and profunda femoris artery angioplasty and stenting. The previous plan for femoral endarterectomy was canceled. He has been using Keystone and [facility] to the wound bed. He currently denies signs of infection. Patient History Information obtained from Patient, Chart. Family History Cancer - Siblings, Kidney Disease - Father. Social History Former smoker - quit 1985, Marital Status - Married, Alcohol Use - Never, Drug Use - No History, Caffeine Use - Daily. Medical History Eyes Patient has history of Cataracts - Extraction  2018 Hematologic/Lymphatic Patient has history of Anemia Respiratory Patient has history of Asthma, Chronic Obstructive Pulmonary Disease (COPD) Cardiovascular Patient has history of Congestive Heart Failure, Coronary Artery Disease, Hypertension, Myocardial Infarction - 2009, Peripheral Arterial Disease Endocrine Patient has history of Type II Diabetes - Diet Controlled-does not take any medications Hospitalization/Surgery History - angioplasty stent- 2019 dr.Clark. - carotid stents. Medical A Surgical History Notes nd Eyes thyroid eye disease, hypertrophia of right eye, hypotropia of left eye, diplopia- corrected per daughter Cardiovascular Pacemaker/defibrillator Endocrine Thyroiditis, Hypothyroidism Objective Constitutional respirations regular, non-labored and within target range for patient.. Vitals Time Taken: 1:38 PM, Height: 69 in, Weight: 168 lbs, BMI: 24.8, Temperature: 97.9 F, Pulse: 65 bpm, Respiratory Rate: 20 breaths/min, Blood Pressure: 131/72 mmHg. Cardiovascular 2+ dorsalis pedis/posterior tibialis pulses. Psychiatric pleasant and cooperative. General Notes: Right lower extremity: T the mid tibial aspect there is an open wound with granulation tissue and nonviable tissue. No signs of surrounding o infection. Integumentary (Hair, Skin) Wound #1 status is Open. Original cause of wound was Trauma. The date acquired was: 07/02/2021. The wound has been in treatment 11 weeks. The wound is located on the Right,Anterior Lower Leg. The wound measures 3cm length x 1.9cm width x 0.2cm depth; 4.477cm^2 area and 0.895cm^3 volume. There is Fat Layer (Subcutaneous Tissue) exposed. There is no tunneling or undermining noted. There is a medium amount of serosanguineous drainage noted. The wound margin is epibole. There is large (67-100%) red, pink granulation within the wound bed. There is a small (1-33%) amount of necrotic tissue within the wound bed including Adherent  Slough. Assessment Active Problems ICD-10 Contusion of right lower leg, subsequent encounter Non-pressure chronic ulcer of other part of right lower leg with other specified severity Chronic venous hypertension (idiopathic) with ulcer of right lower extremity Atherosclerosis of native arteries of right leg with ulceration of other part of lower leg Patient's wound has shown improvement in size and appearance since last clinic visit he had a right lower extremity angiogram with stenting to the right femoral artery and profundus artery. I recommended continuing course with Keystone antibiotic and Hydrofera Blue. I carefully debrided nonviable tissue. No signs of infection on exam. Follow-up in 1  week. Procedures Wound #1 Pre-procedure diagnosis of Wound #1 is an Abrasion located on the Right,Anterior Lower Leg .Severity of Tissue Pre Debridement is: Fat layer exposed. There was a Excisional Skin/Subcutaneous Tissue Debridement with a total area of 4.5 sq cm performed by Kalman Shan, DO. With the following instrument(s): Curette to remove Viable and Non-Viable tissue/material. Material removed includes Subcutaneous Tissue, Slough, Skin: Dermis, Skin: Epidermis, and Fibrin/Exudate after achieving pain control using Lidocaine 4% Topical Solution. A time out was conducted at 13:45, prior to the start of the procedure. A Minimum amount of bleeding was controlled with Pressure. The procedure was tolerated well with a pain level of 0 throughout and a pain level of 0 following the procedure. Post Debridement Measurements: 3cm length x 1.9cm width x 0.2cm depth; 0.895cm^3 volume. Character of Wound/Ulcer Post Debridement requires further debridement. Severity of Tissue Post Debridement is: Fat layer exposed. Post procedure Diagnosis Wound #1: Same as Pre-Procedure Plan Follow-up Appointments: Return Appointment in 1 week. - Dr. Heber [place] [facility] [place], Room 8 12/22/2021 215pm Monday Return Appointment in  2 weeks. - Dr. Heber [place] and [place], Room 8 12/29/2021 215pm Monday Other: - Bring keystone topical antibiotics in at each visit. Ensure to follow up with Dr. Mora [facility] on 01/09/2022 and 01/13/2022 Bathing/ Shower/ Hygiene: May shower with protection but do not get wound dressing(s) wet. Edema Control - Lymphedema / SCD / Other: Elevate legs to the level of the heart or above for 30 minutes daily and/or when sitting, a frequency of: - 3-4 times a day throughout the day. Avoid standing for long periods of time. Exercise regularly WOUND #1: - Lower Leg Wound Laterality: Right, Anterior Cleanser: Soap and Water 1 x Per Day/30 Days Discharge Instructions: May shower and wash wound with dial antibacterial soap and water prior to dressing change. Cleanser: Wound Cleanser (Generic) 1 x Per Day/30 Days Discharge Instructions: Cleanse the wound with wound cleanser prior to applying a clean dressing using gauze sponges, not tissue or cotton balls. Peri-Wound Care: Skin Prep (Generic) 1 x Per Day/30 Days Discharge Instructions: Use skin prep as directed Peri-Wound Care: Sween Lotion (Moisturizing lotion) 1 x Per Day/30 Days Discharge Instructions: Apply moisturizing lotion as directed Topical: Keystone compounding topical antibiotics 1 x Per Day/30 Days Discharge Instructions: apply daily directly to wound bed. Prim Dressing: Hydrofera Blue Ready Foam, 4x5 in 1 x Per Day/30 Days ary Discharge Instructions: Apply to wound bed as instructed Secondary Dressing: Zetuvit Plus Silicone Border Dressing 4x4 (in/in) (Generic) 1 x Per Day/30 Days Discharge Instructions: Apply silicone border over primary dressing as directed. 1. In office sharp debridement 2. Hydrofera Blue and Keystone 3. Follow-up in 1 week Electronic Signature(s) Signed: 12/16/2021 3:44:08 PM By: Kalman Shan DO Entered By: Kalman Shan on 12/16/2021  14:01:34 -------------------------------------------------------------------------------- HxROS Details Patient Name: Date of Service: [name]. 12/16/2021 1:30 PM Medical Record Number: [id number] Patient Account Number: [id number] Date of Birth/Sex: Treating RN: Mar 11, 1934 (88 y.o. [name] Primary Care Provider: [name] Other Clinician: Referring Provider: Treating Provider/Extender: [name] in Treatment: 11 Information Obtained From Patient Chart Eyes Medical History: Positive for: Cataracts - Extraction 2018 Past Medical History Notes: thyroid eye disease, hypertrophia of right eye, hypotropia of left eye, diplopia- corrected per daughter Hematologic/Lymphatic Medical History: Positive for: Anemia Respiratory Medical History: Positive for: Asthma; Chronic Obstructive Pulmonary Disease (COPD) Cardiovascular Medical History: Positive for: Congestive Heart Failure; Coronary Artery Disease; Hypertension; Myocardial Infarction - 2009; Peripheral Arterial Disease Past Medical History Notes:  Pacemaker/defibrillator Endocrine Medical History: Positive for: Type II Diabetes - Diet Controlled-does not take any medications Past Medical History Notes: Thyroiditis, Hypothyroidism Time with diabetes: 2019 Treated with: Diet Blood sugar tested every day: No HBO Extended History Items Eyes: Cataracts Immunizations Pneumococcal Vaccine: Received Pneumococcal Vaccination: No Implantable Devices Yes Hospitalization / Surgery History Type of Hospitalization/Surgery angioplasty stent- 2019 dr.Clark carotid stents Family and Social History Cancer: Yes - Siblings; Kidney Disease: Yes - Father; Former smoker - quit 1985; Marital Status - Married; Alcohol Use: Never; Drug Use: No History; Caffeine Use: Daily; Financial Concerns: No; Food, Clothing or Shelter Needs: No; Support System Lacking: No; Transportation Concerns:  No Electronic Signature(s) Signed: 12/16/2021 3:44:08 PM By: Kalman Shan DO Signed: 12/17/2021 4:20:13 PM By: Deon Pilling RN, BSN Entered By: Kalman Shan on 12/16/2021 14:00:11 -------------------------------------------------------------------------------- SuperBill Details Patient Name: Date of Service: [name]. 12/16/2021 Medical Record Number: [id number] Patient Account Number: [id number] Date of Birth/Sex: Treating RN: Oct 12, 1933 (88 y.o. 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

## 2021-12-19 ENCOUNTER — Other Ambulatory Visit: Payer: Self-pay | Admitting: *Deleted

## 2021-12-19 DIAGNOSIS — I70239 Atherosclerosis of native arteries of right leg with ulceration of unspecified site: Secondary | ICD-10-CM

## 2021-12-19 DIAGNOSIS — I739 Peripheral vascular disease, unspecified: Secondary | ICD-10-CM

## 2021-12-22 ENCOUNTER — Encounter (HOSPITAL_BASED_OUTPATIENT_CLINIC_OR_DEPARTMENT_OTHER): Payer: Medicare Other | Admitting: Internal Medicine

## 2021-12-23 ENCOUNTER — Encounter (HOSPITAL_BASED_OUTPATIENT_CLINIC_OR_DEPARTMENT_OTHER): Payer: Medicare Other | Admitting: Internal Medicine

## 2021-12-23 DIAGNOSIS — I70238 Atherosclerosis of native arteries of right leg with ulceration of other part of lower right leg: Secondary | ICD-10-CM | POA: Diagnosis not present

## 2021-12-23 DIAGNOSIS — I87311 Chronic venous hypertension (idiopathic) with ulcer of right lower extremity: Secondary | ICD-10-CM | POA: Diagnosis not present

## 2021-12-23 DIAGNOSIS — L97818 Non-pressure chronic ulcer of other part of right lower leg with other specified severity: Secondary | ICD-10-CM | POA: Diagnosis not present

## 2021-12-23 DIAGNOSIS — S8011XD Contusion of right lower leg, subsequent encounter: Secondary | ICD-10-CM | POA: Diagnosis not present

## 2021-12-23 NOTE — Progress Notes (Signed)
Jonathon, Shea ([id number]) Visit Report for 12/23/2021 Chief Complaint Document Details Patient Name: Date of Service: Jonathon, Shea 12/23/2021 2:30 PM Medical Record Number: [id number] Patient Account Number: [id number] Date of Birth/Sex: Treating RN: [birth year] (88 y.o. M) Primary Care Provider: [name] Other Clinician: Referring Provider: Treating Provider/Extender: [name] in Treatment: 12 Information Obtained from: Patient Chief Complaint 09/26/2021; patient is here for a review of a complicated wound on the right anterior lower leg which was initially secondary to trauma Electronic Signature(s) Signed: 12/23/2021 3:31:45 PM By: Kalman Shan DO Entered By: Kalman Shan on 12/23/2021 14:49:19 -------------------------------------------------------------------------------- Debridement Details Patient Name: Date of Service: [name]. 12/23/2021 2:30 PM Medical Record Number: [id number] Patient Account Number: [id number] Date of Birth/Sex: Treating RN: 12-19-1933 (88 y.o. [name], Meta.Reding Primary Care Provider: [name] Other Clinician: Referring Provider: Treating Provider/Extender: [name] in Treatment: 12 Debridement Performed for Assessment: Wound #1 Right,Anterior Lower Leg Performed By: Physician Kalman Shan, DO Debridement Type: Debridement Severity of Tissue Pre Debridement: Fat layer exposed Level of Consciousness (Pre-procedure): Awake and Alert Pre-procedure Verification/Time Out Yes - 14:40 Taken: Start Time: 14:41 Pain Control: Lidocaine 5% topical ointment T Area Debrided (L x W): otal 2.5 (cm) x 2 (cm) = 5 (cm) Tissue and other material debrided: Viable, Non-Viable, Slough, Subcutaneous, Slough Level: Skin/Subcutaneous Tissue Debridement Description: Excisional Instrument: Curette Bleeding: Minimum Hemostasis Achieved: Pressure End Time: 14:45 Procedural Pain:  0 Post Procedural Pain: 0 Response to Treatment: Procedure was tolerated well Level of Consciousness (Post- Awake and Alert procedure): Post Debridement Measurements of Total Wound Length: (cm) 2.9 Width: (cm) 2 Depth: (cm) 0.2 Volume: (cm) 0.911 Character of Wound/Ulcer Post Debridement: Requires Further Debridement Severity of Tissue Post Debridement: Fat layer exposed Post Procedure Diagnosis Same as Pre-procedure Electronic Signature(s) Signed: 12/23/2021 3:31:45 PM By: Kalman Shan DO Signed: 12/23/2021 5:12:34 PM By: Deon Pilling RN, BSN Entered By: Deon Pilling on 12/23/2021 14:46:06 -------------------------------------------------------------------------------- HPI Details Patient Name: Date of Service: [name]. 12/23/2021 2:30 PM Medical Record Number: [id number] Patient Account Number: [id number] Date of Birth/Sex: Treating RN: 1934/04/22 (88 y.o. M) Primary Care Provider: [name] Other Clinician: Referring Provider: Treating Provider/Extender: [name] in Treatment: 12 History of Present Illness HPI Description: ADMISSION 09/26/21 This is a 88 year old man who lives near [place]. He is accompanied by his daughter who lives with him. His wife is also at home. Apparently 2 months ago he traumatized the right anterior lower leg x2 in quick succession including once on the back of a pickup truck or car. By description it sounds as though he had a black eschar on this which fell off about a month ago. He has been left with an open wound. It this is not that large but is completely slough covered and has undermining of at least a centimeter and half from 12-6 o'clock. He received a course of antibiotics earlier this month from his primary care doctor Ceftin. He is not on antibiotics currently. The patient has a complicated relevant history. 2019 he underwent bilateral common iliac artery stenting and angioplasties by Dr. Carlis Abbott.  He had ABIs at the same time which showed a ABI at 0.55 on the right and a TBI of 0.38. He had a TBI of 0.43 on the left they could not do an ABI because he could not tolerate the pressure cuff. He has not followed up. The patient is apparently a diabetic although he has not been on  any treatment in years and its not clear that he is even followed up. Other past medical history includes coronary artery disease, ischemic cardiomyopathy status post pacemaker, he is on Plavix, He did not cooperate with our attempt to do ABIs on the right leg in our clinic 5/5; patient presents for follow-up. Last week he was started on Iodosorb under Kerlix/Coban. He has no issues or complaints today. He has his venous and arterial studies scheduled for next week. He currently denies signs of infection. 5/16; patient presents for follow-up. Last week he was started on Hydrofera Blue and gentamicin and mupirocin ointment under Kerlix/Coban. He tolerated this well. He had a PCR culture that reported high levels of Streptococcus agalactiae, Pseudomonas aeruginosa and Enterococcus bacillus. Keystone antibiotics were ordered and he has this however did not bring it today. He had ABIs with TBI's and venous reflux studies. He had evidence of reflux throughout the right and left venous system. His ABIs on the right was noncompressible with a TBI of 0.19. He currently denies systemic signs of infection. 5/23; patient presents for follow-up. He has been using Keystone antibiotic to the wound bed without issues. He currently denies signs of infection. He has not heard back from Dr. Kennon Holter office. 6/8; patient is back his wound is somewhat better with less undermining but still the same basic surface area. The wound surface looks better in terms of nonviable material. But the other issue that concerns me is this patient probably has some minimal distance claudication and may even have claudication at rest at night in the right leg.  Referencing my notes from his admission he has had previous bilateral common iliac artery stenting and angioplasties in 2019. For some reason I do not know that he ever had any follow-up. 6/19; patient presents for follow-up. He saw Dr. Luan Pulling on 6/16 that recommended a CT angiogram to evaluate iliac stents and right common femoral disease. Patient is using Keystone antibiotic with [facility] without issues. He denies signs of infection. 6/26; patient presents for follow-up. He had a CT angiogram that showed a patent common iliac artery. There is advanced right femoral popliteal disease including SFA stenosis with plaque extending into the SFA and profunda femoris. 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 He has an appointment scheduled tomorrow with Dr. Roselie Awkward to review these results. 7/10; patient presents for follow-up. He is scheduled for right femoral endarterectomy on 7/13 with Dr. Roselie Awkward. He has developed a smaller wound more proximal to the original wound. He is not sure how this started. He has been using Keystone antibiotic and Hydrofera Blue to the wound bed. He denies signs of infection. 7/18; patient presents for follow-up. On 7/13 he had a right lower extremity angiogram with right common femoral and profunda femoris artery angioplasty and stenting. The previous plan for femoral endarterectomy was canceled. He has been using Keystone and [facility] to the wound bed. He currently denies signs of infection. 7/25; patient presents for follow-up. He has been using Keystone and [facility] to the wound bed. He reports improvement in his chronic pain and wound healing. Electronic Signature(s) Signed: 12/23/2021 3:31:45 PM By: Kalman Shan DO Signed: 12/23/2021 3:31:45 PM By: Kalman Shan DO Entered By: Kalman Shan on 12/23/2021  14:49:52 -------------------------------------------------------------------------------- Physical Exam Details Patient Name: Date of Service: [name]. 12/23/2021 2:30 PM Medical Record Number: [id number] Patient Account Number: [id number] Date of Birth/Sex: Treating RN: April 04, 1934 (88 y.o. M) Primary Care Provider:  [name] Other Clinician: Referring Provider: Treating Provider/Extender: [name] in Treatment: 12 Constitutional respirations regular, non-labored and within target range for patient.. Cardiovascular 2+ dorsalis pedis/posterior tibialis pulses. Psychiatric pleasant and cooperative. Notes Right lower extremity: T the mid tibial aspect there is an open wound with granulation tissue and nonviable tissue. No signs of surrounding infection. o Electronic Signature(s) Signed: 12/23/2021 3:31:45 PM By: Kalman Shan DO Entered By: Kalman Shan on 12/23/2021 14:50:15 -------------------------------------------------------------------------------- Physician Orders Details Patient Name: Date of Service: [name]. 12/23/2021 2:30 PM Medical Record Number: [id number] Patient Account Number: [id number] Date of Birth/Sex: Treating RN: 1934-03-28 (88 y.o. [name] Primary Care Provider: [name] Other Clinician: Referring Provider: Treating Provider/Extender: [name] in Treatment: 12 Verbal / Phone Orders: No Diagnosis Coding Follow-up Appointments ppointment in 1 week. - Monday 12/29/2021 1415 Dr. Heber [place] and [name], Room 8 Return A Other: - Bring keystone topical antibiotics in at each visit. Ensure to follow up with Dr. Mora Appl [facility] on 01/09/2022 and 01/13/2022 Bathing/ Shower/ Hygiene May shower with protection but do not get wound dressing(s) wet. Edema Control - Lymphedema / SCD / Other Elevate legs to the level of the heart or above for 30 minutes daily and/or when sitting, a  frequency of: - 3-4 times a day throughout the day. Avoid standing for long periods of time. Exercise regularly Wound Treatment Wound #1 - Lower Leg Wound Laterality: Right, Anterior Cleanser: Soap and Water 1 x Per Day/30 Days Discharge Instructions: May shower and wash wound with dial antibacterial soap and water prior to dressing change. Cleanser: Wound Cleanser (Generic) 1 x Per Day/30 Days Discharge Instructions: Cleanse the wound with wound cleanser prior to applying a clean dressing using gauze sponges, not tissue or cotton balls. Peri-Wound Care: Skin Prep (Generic) 1 x Per Day/30 Days Discharge Instructions: Use skin prep as directed Peri-Wound Care: Sween Lotion (Moisturizing lotion) 1 x Per Day/30 Days Discharge Instructions: Apply moisturizing lotion as directed Topical: Keystone compounding topical antibiotics 1 x Per Day/30 Days Discharge Instructions: apply daily directly to wound bed. Prim Dressing: Hydrofera Blue Ready Foam, 4x5 in 1 x Per Day/30 Days ary Discharge Instructions: Apply to wound bed as instructed Secondary Dressing: Zetuvit Plus Silicone Border Dressing 4x4 (in/in) (Generic) 1 x Per Day/30 Days Discharge Instructions: Apply silicone border over primary dressing as directed. Electronic Signature(s) Signed: 12/23/2021 3:31:45 PM By: Kalman Shan DO Entered By: Kalman Shan on 12/23/2021 14:50:23 -------------------------------------------------------------------------------- Problem List Details Patient Name: Date of Service: [name]. 12/23/2021 2:30 PM Medical Record Number: [id number] Patient Account Number: [id number] Date of Birth/Sex: Treating RN: 1934-04-28 (88 y.o. M) Primary Care Provider: [name] Other Clinician: Referring Provider: Treating Provider/Extender: [name] in Treatment: 12 Active Problems ICD-10 Encounter Code Description Active Date MDM Diagnosis S80.11XD Contusion of right  lower leg, subsequent encounter 09/26/2021 No Yes L97.818 Non-pressure chronic ulcer of other part of right lower leg with other specified 09/26/2021 No Yes severity I87.311 Chronic venous hypertension (idiopathic) with ulcer of right lower extremity 09/26/2021 No Yes I70.238 Atherosclerosis of native arteries of right leg with ulceration of other part of 10/14/2021 No Yes lower leg Inactive Problems Resolved Problems Electronic Signature(s) Signed: 12/23/2021 3:31:45 PM By: Kalman Shan DO Entered By: Kalman Shan on 12/23/2021 14:49:06 -------------------------------------------------------------------------------- Progress Note Details Patient Name: Date of Service: [name]. 12/23/2021 2:30 PM Medical Record Number: [id number] Patient Account Number: [id number] Date of Birth/Sex: Treating RN: December 13, 1933 (88 y.o.  M) Primary Care Provider: [name] Other Clinician: Referring Provider: Treating Provider/Extender: [name] in Treatment: 12 Subjective Chief Complaint Information obtained from Patient 09/26/2021; patient is here for a review of a complicated wound on the right anterior lower leg which was initially secondary to trauma History of Present Illness (HPI) ADMISSION 09/26/21 This is a 88 year old man who lives near [place]. He is accompanied by his daughter who lives with him. His wife is also at home. Apparently 2 months ago he traumatized the right anterior lower leg x2 in quick succession including once on the back of a pickup truck or car. By description it sounds as though he had a black eschar on this which fell off about a month ago. He has been left with an open wound. It this is not that large but is completely slough covered and has undermining of at least a centimeter and half from 12-6 o'clock. He received a course of antibiotics earlier this month from his primary care doctor Ceftin. He is not on antibiotics  currently. The patient has a complicated relevant history. 2019 he underwent bilateral common iliac artery stenting and angioplasties by Dr. Carlis Abbott. He had ABIs at the same time which showed a ABI at 0.55 on the right and a TBI of 0.38. He had a TBI of 0.43 on the left they could not do an ABI because he could not tolerate the pressure cuff. He has not followed up. The patient is apparently a diabetic although he has not been on any treatment in years and its not clear that he is even followed up. Other past medical history includes coronary artery disease, ischemic cardiomyopathy status post pacemaker, he is on Plavix, He did not cooperate with our attempt to do ABIs on the right leg in our clinic 5/5; patient presents for follow-up. Last week he was started on Iodosorb under Kerlix/Coban. He has no issues or complaints today. He has his venous and arterial studies scheduled for next week. He currently denies signs of infection. 5/16; patient presents for follow-up. Last week he was started on Hydrofera Blue and gentamicin and mupirocin ointment under Kerlix/Coban. He tolerated this well. He had a PCR culture that reported high levels of Streptococcus agalactiae, Pseudomonas aeruginosa and Enterococcus bacillus. Keystone antibiotics were ordered and he has this however did not bring it today. He had ABIs with TBI's and venous reflux studies. He had evidence of reflux throughout the right and left venous system. His ABIs on the right was noncompressible with a TBI of 0.19. He currently denies systemic signs of infection. 5/23; patient presents for follow-up. He has been using Keystone antibiotic to the wound bed without issues. He currently denies signs of infection. He has not heard back from Dr. Kennon Holter office. 6/8; patient is back his wound is somewhat better with less undermining but still the same basic surface area. The wound surface looks better in terms of nonviable material. But the other  issue that concerns me is this patient probably has some minimal distance claudication and may even have claudication at rest at night in the right leg. Referencing my notes from his admission he has had previous bilateral common iliac artery stenting and angioplasties in 2019. For some reason I do not know that he ever had any follow-up. 6/19; patient presents for follow-up. He saw Dr. Luan Pulling on 6/16 that recommended a CT angiogram to evaluate iliac stents and right common femoral disease. Patient is using Keystone antibiotic with [facility]  without issues. He denies signs of infection. 6/26; patient presents for follow-up. He had a CT angiogram that showed a patent common iliac artery. There is advanced right femoral popliteal disease including SFA stenosis with plaque extending into the SFA and profunda femoris. 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 He has an appointment scheduled tomorrow with Dr. Roselie Awkward to review these results. 7/10; patient presents for follow-up. He is scheduled for right femoral endarterectomy on 7/13 with Dr. Roselie Awkward. He has developed a smaller wound more proximal to the original wound. He is not sure how this started. He has been using Keystone antibiotic and Hydrofera Blue to the wound bed. He denies signs of infection. 7/18; patient presents for follow-up. On 7/13 he had a right lower extremity angiogram with right common femoral and profunda femoris artery angioplasty and stenting. The previous plan for femoral endarterectomy was canceled. He has been using Keystone and [facility] to the wound bed. He currently denies signs of infection. 7/25; patient presents for follow-up. He has been using Keystone and [facility] to the wound bed. He reports improvement in his chronic pain and wound healing. Patient History Information obtained from Patient, Chart. Family History Cancer - Siblings, Kidney  Disease - Father. Social History Former smoker - quit 1985, Marital Status - Married, Alcohol Use - Never, Drug Use - No History, Caffeine Use - Daily. Medical History Eyes Patient has history of Cataracts - Extraction 2018 Hematologic/Lymphatic Patient has history of Anemia Respiratory Patient has history of Asthma, Chronic Obstructive Pulmonary Disease (COPD) Cardiovascular Patient has history of Congestive Heart Failure, Coronary Artery Disease, Hypertension, Myocardial Infarction - 2009, Peripheral Arterial Disease Endocrine Patient has history of Type II Diabetes - Diet Controlled-does not take any medications Hospitalization/Surgery History - angioplasty stent- 2019 dr.Clark. - carotid stents. Medical A Surgical History Notes nd Eyes thyroid eye disease, hypertrophia of right eye, hypotropia of left eye, diplopia- corrected per daughter Cardiovascular Pacemaker/defibrillator Endocrine Thyroiditis, Hypothyroidism Objective Constitutional respirations regular, non-labored and within target range for patient.. Vitals Time Taken: 2:20 PM, Height: 69 in, Weight: 168 lbs, BMI: 24.8, Temperature: 98 F, Pulse: 62 bpm, Respiratory Rate: 20 breaths/min, Blood Pressure: 147/73 mmHg. Cardiovascular 2+ dorsalis pedis/posterior tibialis pulses. Psychiatric pleasant and cooperative. General Notes: Right lower extremity: T the mid tibial aspect there is an open wound with granulation tissue and nonviable tissue. No signs of surrounding o infection. Integumentary (Hair, Skin) Wound #1 status is Open. Original cause of wound was Trauma. The date acquired was: 07/02/2021. The wound has been in treatment 12 weeks. The wound is located on the Right,Anterior Lower Leg. The wound measures 2.9cm length x 2cm width x 0.2cm depth; 4.555cm^2 area and 0.911cm^3 volume. There is Fat Layer (Subcutaneous Tissue) exposed. There is no tunneling or undermining noted. There is a medium amount of  serosanguineous drainage noted. The wound margin is thickened. There is large (67-100%) red, pink granulation within the wound bed. There is a small (1-33%) amount of necrotic tissue within the wound bed including Adherent Slough. Assessment Active Problems ICD-10 Contusion of right lower leg, subsequent encounter Non-pressure chronic ulcer of other part of right lower leg with other specified severity Chronic venous hypertension (idiopathic) with ulcer of right lower extremity Atherosclerosis of native arteries of right leg with ulceration of other part of lower leg Patient's wound has shown improvement in size in appearance since last clinic visit. I debrided nonviable tissue. I recommended continuing the course with [facility]  and Keystone antibiotic. Follow-up in 1 week. Procedures Wound #1 Pre-procedure diagnosis of Wound #1 is an Abrasion located on the Right,Anterior Lower Leg .Severity of Tissue Pre Debridement is: Fat layer exposed. There was a Excisional Skin/Subcutaneous Tissue Debridement with a total area of 5 sq cm performed by Kalman Shan, DO. With the following instrument(s): Curette to remove Viable and Non-Viable tissue/material. Material removed includes Subcutaneous Tissue and Slough and after achieving pain control using Lidocaine 5% topical ointment. A time out was conducted at 14:40, prior to the start of the procedure. A Minimum amount of bleeding was controlled with Pressure. The procedure was tolerated well with a pain level of 0 throughout and a pain level of 0 following the procedure. Post Debridement Measurements: 2.9cm length x 2cm width x 0.2cm depth; 0.911cm^3 volume. Character of Wound/Ulcer Post Debridement requires further debridement. Severity of Tissue Post Debridement is: Fat layer exposed. Post procedure Diagnosis Wound #1: Same as Pre-Procedure Plan Follow-up Appointments: Return Appointment in 1 week. - Monday 12/29/2021 1415 Dr. Heber [place] and  [name], Room 8 Other: - Bring keystone topical antibiotics in at each visit. Ensure to follow up with Dr. Mora Appl [facility] on 01/09/2022 and 01/13/2022 Bathing/ Shower/ Hygiene: May shower with protection but do not get wound dressing(s) wet. Edema Control - Lymphedema / SCD / Other: Elevate legs to the level of the heart or above for 30 minutes daily and/or when sitting, a frequency of: - 3-4 times a day throughout the day. Avoid standing for long periods of time. Exercise regularly WOUND #1: - Lower Leg Wound Laterality: Right, Anterior Cleanser: Soap and Water 1 x Per Day/30 Days Discharge Instructions: May shower and wash wound with dial antibacterial soap and water prior to dressing change. Cleanser: Wound Cleanser (Generic) 1 x Per Day/30 Days Discharge Instructions: Cleanse the wound with wound cleanser prior to applying a clean dressing using gauze sponges, not tissue or cotton balls. Peri-Wound Care: Skin Prep (Generic) 1 x Per Day/30 Days Discharge Instructions: Use skin prep as directed Peri-Wound Care: Sween Lotion (Moisturizing lotion) 1 x Per Day/30 Days Discharge Instructions: Apply moisturizing lotion as directed Topical: Keystone compounding topical antibiotics 1 x Per Day/30 Days Discharge Instructions: apply daily directly to wound bed. Prim Dressing: Hydrofera Blue Ready Foam, 4x5 in 1 x Per Day/30 Days ary Discharge Instructions: Apply to wound bed as instructed Secondary Dressing: Zetuvit Plus Silicone Border Dressing 4x4 (in/in) (Generic) 1 x Per Day/30 Days Discharge Instructions: Apply silicone border over primary dressing as directed. 1. In office sharp debridement 2. Hydrofera Blue and Keystone antibiotic 3. Follow-up in 1 week Electronic Signature(s) Signed: 12/23/2021 3:31:45 PM By: Kalman Shan DO Entered By: Kalman Shan on 12/23/2021 14:51:25 -------------------------------------------------------------------------------- HxROS Details Patient  Name: Date of Service: [name]. 12/23/2021 2:30 PM Medical Record Number: [id number] Patient Account Number: [id number] Date of Birth/Sex: Treating RN: 07-02-1933 (88 y.o. M) Primary Care Provider: [name] Other Clinician: Referring Provider: Treating Provider/Extender: [name] in Treatment: 12 Information Obtained From Patient Chart Eyes Medical History: Positive for: Cataracts - Extraction 2018 Past Medical History Notes: thyroid eye disease, hypertrophia of right eye, hypotropia of left eye, diplopia- corrected per daughter Hematologic/Lymphatic Medical History: Positive for: Anemia Respiratory Medical History: Positive for: Asthma; Chronic Obstructive Pulmonary Disease (COPD) Cardiovascular Medical History: Positive for: Congestive Heart Failure; Coronary Artery Disease; Hypertension; Myocardial Infarction - 2009; Peripheral Arterial Disease Past Medical History Notes: Pacemaker/defibrillator Endocrine Medical History: Positive for: Type II Diabetes - Diet Controlled-does not take  any medications Past Medical History Notes: Thyroiditis, Hypothyroidism Time with diabetes: 2019 Treated with: Diet Blood sugar tested every day: No HBO Extended History Items Eyes: Cataracts Immunizations Pneumococcal Vaccine: Received Pneumococcal Vaccination: No Implantable Devices Yes Hospitalization / Surgery History Type of Hospitalization/Surgery angioplasty stent- 2019 dr.Clark carotid stents Family and Social History Cancer: Yes - Siblings; Kidney Disease: Yes - Father; Former smoker - quit 1985; Marital Status - Married; Alcohol Use: Never; Drug Use: No History; Caffeine Use: Daily; Financial Concerns: No; Food, Clothing or Shelter Needs: No; Support System Lacking: No; Transportation Concerns: No Electronic Signature(s) Signed: 12/23/2021 3:31:45 PM By: Kalman Shan DO Entered By: Kalman Shan on 12/23/2021  14:49:59 -------------------------------------------------------------------------------- SuperBill Details Patient Name: Date of Service: [name] 12/23/2021 Medical Record Number: [id number] Patient Account Number: [id number] Date of Birth/Sex: Treating RN: 1934-05-06 (88 y.o. 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

## 2021-12-23 NOTE — Progress Notes (Signed)
MAKAEL, STEIN ([id number]) Visit Report for 12/23/2021 Arrival Information Details Patient Name: Date of Service: KYO, COCUZZA 12/23/2021 2:30 PM Medical Record Number: [id number] Patient Account Number: [id number] Date of Birth/Sex: Treating RN: 1934/03/08 (88 y.o. [name] Primary Care Nashly Olsson: [name] Other Clinician: Referring Jeffry Vogelsang: Treating Ghazi Rumpf/Extender: [name] in Treatment: 12 Visit Information History Since Last Visit Added or deleted any medications: No Patient Arrived: Ambulatory Any new allergies or adverse reactions: No Arrival Time: 14:20 Had a fall or experienced change in No Accompanied By: daughter activities of daily living that may affect Transfer Assistance: None risk of falls: Patient Identification Verified: Yes Signs or symptoms of abuse/neglect since last visito No Secondary Verification Process Completed: Yes Hospitalized since last visit: No Patient Requires Transmission-Based Precautions: No Implantable device outside of the clinic excluding No Patient Has Alerts: Yes cellular tissue based products placed in the center Patient Alerts: ZWC:HENIDP to obtain;pain since last visit: Has Dressing in Place as Prescribed: Yes Pain Present Now: No Electronic Signature(s) Signed: 12/23/2021 5:12:34 PM By: Deon Pilling RN, BSN Entered By: Deon Pilling on 12/23/2021 14:25:47 -------------------------------------------------------------------------------- Encounter Discharge Information Details Patient Name: Date of Service: [name] 12/23/2021 2:30 PM Medical Record Number: [id number] Patient Account Number: [id number] Date of Birth/Sex: Treating RN: 1933-11-22 (88 y.o. [name] Primary Care Weston Kallman: [name] Other Clinician: Referring Jacobi Nile: Treating Amelianna Meller/Extender: [name] in Treatment: 12 Encounter Discharge Information Items Post Procedure  Vitals Discharge Condition: Stable Temperature (F): 98 Ambulatory Status: Ambulatory Pulse (bpm): 62 Discharge Destination: Home Respiratory Rate (breaths/min): 20 Transportation: Private Auto Blood Pressure (mmHg): 147/73 Accompanied By: daughter Schedule Follow-up Appointment: Yes Clinical Summary of Care: Electronic Signature(s) Signed: 12/23/2021 5:12:34 PM By: Deon Pilling RN, BSN Entered By: Deon Pilling on 12/23/2021 14:47:49 -------------------------------------------------------------------------------- Lower Extremity Assessment Details Patient Name: Date of Service: [name] 12/23/2021 2:30 PM Medical Record Number: [id number] Patient Account Number: [id number] Date of Birth/Sex: Treating RN: [birth year] (88 y.o. [name] Primary Care Tamarra Geiselman: [name] Other Clinician: Referring Sussan Meter: Treating Laporchia Nakajima/Extender: [name] in Treatment: 12 Edema Assessment Assessed: [name]: No] [name]: Yes] Edema: [Left: Ye] [Right: s] Calf Left: Right: Point of Measurement: 31 cm From Medial Instep 33 cm Ankle Left: Right: Point of Measurement: 7 cm From Medial Instep 26 cm Vascular Assessment Pulses: Dorsalis Pedis Palpable: [Right:Yes] Electronic Signature(s) Signed: 12/23/2021 5:12:34 PM By: Deon Pilling RN, BSN Entered By: Deon Pilling on 12/23/2021 14:26:22 -------------------------------------------------------------------------------- Multi Wound Chart Details Patient Name: Date of Service: [name] 12/23/2021 2:30 PM Medical Record Number: [id number] Patient Account Number: [id number] Date of Birth/Sex: Treating RN: 1933/10/19 (88 y.o. M) Primary Care Georgeana Oertel: [name] Other Clinician: Referring Tyrene Nader: Treating Betzaira Mentel/Extender: [name] in Treatment: 12 Vital Signs Height(in): 49 Pulse(bpm): 69 Weight(lbs): 168 Blood Pressure(mmHg): 147/73 Body Mass  Index(BMI): 24.8 Temperature(F): 98 Respiratory Rate(breaths/min): 46 Photos: [1:Right, Anterior Lower Leg] [N/A:N/A N/A] Wound Location: [1:Trauma] [N/A:N/A] Wounding Event: [1:Abrasion] [N/A:N/A] Primary Etiology: [1:Diabetic Wound/Ulcer of the Lower] [N/A:N/A] Secondary Etiology: [1:Extremity Cataracts, Anemia, Asthma, Chronic N/A] Comorbid History: [1:Obstructive Pulmonary Disease (COPD), Congestive Heart Failure, Coronary Artery Disease, Hypertension, Myocardial Infarction, Peripheral Arterial Disease, Type II Diabetes 07/02/2021] [N/A:N/A] Date Acquired: [1:12] [N/A:N/A] Weeks of Treatment: [1:Open] [N/A:N/A] Wound Status: [1:No] [N/A:N/A] Wound Recurrence: [1:2.9x2x0.2] [N/A:N/A] Measurements L x W x D (cm) [1:4.555] [N/A:N/A] A (cm) : rea [1:0.911] [N/A:N/A] Volume (cm) : [1:-26.40%] [N/A:N/A] % Reduction in A [1:rea: 36.80%] [N/A:N/A] %  Reduction in Volume: [1:Full Thickness Without Exposed] [N/A:N/A] Classification: [1:Support Structures Medium] [N/A:N/A] Exudate A mount: [1:Serosanguineous] [N/A:N/A] Exudate Type: [1:red, brown] [N/A:N/A] Exudate Color: [1:Thickened] [N/A:N/A] Wound Margin: [1:Large (67-100%)] [N/A:N/A] Granulation A mount: [1:Red, Pink] [N/A:N/A] Granulation Quality: [1:Small (1-33%)] [N/A:N/A] Necrotic A mount: [1:Fat Layer (Subcutaneous Tissue): Yes N/A] Exposed Structures: [1:Fascia: No Tendon: No Muscle: No Joint: No Bone: No Medium (34-66%)] [N/A:N/A] Epithelialization: [1:Debridement - Excisional] [N/A:N/A] Debridement: Pre-procedure Verification/Time Out 14:40 [N/A:N/A] Taken: [1:Lidocaine 5% topical ointment] [N/A:N/A] Pain Control: [1:Subcutaneous, Slough] [N/A:N/A] Tissue Debrided: [1:Skin/Subcutaneous Tissue] [N/A:N/A] Level: [1:5] [N/A:N/A] Debridement A (sq cm): [1:rea Curette] [N/A:N/A] Instrument: [1:Minimum] [N/A:N/A] Bleeding: [1:Pressure] [N/A:N/A] Hemostasis A chieved: [1:0] [N/A:N/A] Procedural Pain: [1:0] [N/A:N/A] Post  Procedural Pain: [1:Procedure was tolerated well] [N/A:N/A] Debridement Treatment Response: [1:2.9x2x0.2] [N/A:N/A] Post Debridement Measurements L x W x D (cm) [1:0.911] [N/A:N/A] Post Debridement Volume: (cm) [1:Debridement] [N/A:N/A] Treatment Notes Wound #1 (Lower Leg) Wound Laterality: Right, Anterior Cleanser Soap and Water Discharge Instruction: May shower and wash wound with dial antibacterial soap and water prior to dressing change. Wound Cleanser Discharge Instruction: Cleanse the wound with wound cleanser prior to applying a clean dressing using gauze sponges, not tissue or cotton balls. Peri-Wound Care Skin Prep Discharge Instruction: Use skin prep as directed Sween Lotion (Moisturizing lotion) Discharge Instruction: Apply moisturizing lotion as directed Topical [employer] compounding topical antibiotics Discharge Instruction: apply daily directly to wound bed. Primary Dressing Hydrofera Blue Ready Foam, 4x5 in Discharge Instruction: Apply to wound bed as instructed Secondary Dressing Zetuvit Plus Silicone Border Dressing 4x4 (in/in) Discharge Instruction: Apply silicone border over primary dressing as directed. Secured With Compression Wrap Compression Stockings Add-Ons Electronic Signature(s) Signed: 12/23/2021 3:31:45 PM By: Kalman Shan DO Entered By: Kalman Shan on 12/23/2021 14:49:11 -------------------------------------------------------------------------------- Multi-Disciplinary Care Plan Details Patient Name: Date of Service: [name] 12/23/2021 2:30 PM Medical Record Number: [id number] Patient Account Number: [id number] Date of Birth/Sex: Treating RN: [birth year] (88 y.o. [name] Primary Care Nilza Eaker: [name] Other Clinician: Referring Harshal Sirmon: Treating Dondrell Loudermilk/Extender: [name] in Treatment: 12 Active Inactive Tissue Oxygenation Nursing Diagnoses: Potential alteration in peripheral  tissue perfusion (select prior to confirmation of diagnosis) Goals: Non-invasive arterial studies are completed as ordered Date Initiated: 10/14/2021 Target Resolution Date: 12/26/2021 Goal Status: Active Interventions: Assess patient understanding of disease process and management upon diagnosis and as needed Assess peripheral arterial status upon admission and as needed Provide education on tissue oxygenation and ischemia Treatment Activities: Ankle Brachial Index (ABI) : 09/30/2021 Non-invasive vascular studies : 10/14/2021 Notes: Wound/Skin Impairment Nursing Diagnoses: Knowledge deficit related to ulceration/compromised skin integrity Goals: Patient/caregiver will verbalize understanding of skin care regimen Date Initiated: 09/26/2021 Target Resolution Date: 12/26/2021 Goal Status: Active Interventions: Assess patient/caregiver ability to perform ulcer/skin care regimen upon admission and as needed Assess ulceration(s) every visit Provide education on ulcer and skin care Treatment Activities: Skin care regimen initiated : 09/26/2021 Topical wound management initiated : 09/26/2021 Notes: 10/21/21: Wound care regimen continues, using [employer] and [employer]. Electronic Signature(s) Signed: 12/23/2021 5:12:34 PM By: Deon Pilling RN, BSN Entered By: Deon Pilling on 12/23/2021 14:46:55 -------------------------------------------------------------------------------- Pain Assessment Details Patient Name: Date of Service: [name] 12/23/2021 2:30 PM Medical Record Number: [id number] Patient Account Number: [id number] Date of Birth/Sex: Treating RN: [birth year] (88 y.o. [name] Primary Care Shonta Phillis: [name] Other Clinician: Referring Armando Bukhari: Treating Ally Knodel/Extender: [name] in Treatment: 12 Active Problems Location of Pain Severity and Description of Pain Patient Has Paino No Site  Locations Rate the pain. Current Pain  Level: 0 Pain Management and Medication Current Pain Management: Medication: No Cold Application: No Rest: No Massage: No Activity: No T.E.N.S.: No Heat Application: No Leg drop or elevation: No Is the Current Pain Management Adequate: Adequate How does your wound impact your activities of daily livingo Sleep: No Bathing: No Appetite: No Relationship With Others: No Bladder Continence: No Emotions: No Bowel Continence: No Work: No Toileting: No Drive: No Dressing: No Hobbies: No Engineer, maintenance) Signed: 12/23/2021 5:12:34 PM By: Deon Pilling RN, BSN Entered By: Deon Pilling on 12/23/2021 14:26:14 -------------------------------------------------------------------------------- Patient/Caregiver Education Details Patient Name: Date of Service: [name] 7/25/2023andnbsp2:30 PM Medical Record Number: [id number] Patient Account Number: [id number] Date of Birth/Gender: Treating RN: 02/02/1934 (88 y.o. [name] Primary Care Physician: [name] Other Clinician: Referring Physician: Treating Physician/Extender: [name] in Treatment: 12 Education Assessment Education Provided To: Patient and Caregiver Education Topics Provided Wound/Skin Impairment: Handouts: Skin Care Do's and Dont's Methods: Explain/Verbal Responses: Reinforcements needed Electronic Signature(s) Signed: 12/23/2021 5:12:34 PM By: Deon Pilling RN, BSN Entered By: Deon Pilling on 12/23/2021 14:47:05 -------------------------------------------------------------------------------- Wound Assessment Details Patient Name: Date of Service: [name] 12/23/2021 2:30 PM Medical Record Number: [id number] Patient Account Number: [id number] Date of Birth/Sex: Treating RN: Nov 05, 1933 (88 y.o. [name], Meta.Reding Primary Care Milagro Belmares: [name] Other Clinician: Referring Vickye Astorino: Treating Zharia Conrow/Extender: [name] in Treatment: 12 Wound Status Wound Number: 1 Primary Abrasion Etiology: Wound Location: Right, Anterior Lower Leg Secondary Diabetic Wound/Ulcer of the Lower Extremity Wounding Event: Trauma Etiology: Date Acquired: 07/02/2021 Wound Open Weeks Of Treatment: 12 Status: Clustered Wound: No Comorbid Cataracts, Anemia, Asthma, Chronic Obstructive Pulmonary History: Disease (COPD), Congestive Heart Failure, Coronary Artery Disease, Hypertension, Myocardial Infarction, Peripheral Arterial Disease, Type II Diabetes Photos Wound Measurements Length: (cm) 2.9 Width: (cm) 2 Depth: (cm) 0.2 Area: (cm) 4.555 Volume: (cm) 0.911 % Reduction in Area: -26.4% % Reduction in Volume: 36.8% Epithelialization: Medium (34-66%) Tunneling: No Undermining: No Wound Description Classification: Full Thickness Without Exposed Support Structures Wound Margin: Thickened Exudate Amount: Medium Exudate Type: Serosanguineous Exudate Color: red, brown Foul Odor After Cleansing: No Slough/Fibrino Yes Wound Bed Granulation Amount: Large (67-100%) Exposed Structure Granulation Quality: Red, Pink Fascia Exposed: No Necrotic Amount: Small (1-33%) Fat Layer (Subcutaneous Tissue) Exposed: Yes Necrotic Quality: Adherent Slough Tendon Exposed: No Muscle Exposed: No Joint Exposed: No Bone Exposed: No Treatment Notes Wound #1 (Lower Leg) Wound Laterality: Right, Anterior Cleanser Soap and Water Discharge Instruction: May shower and wash wound with dial antibacterial soap and water prior to dressing change. Wound Cleanser Discharge Instruction: Cleanse the wound with wound cleanser prior to applying a clean dressing using gauze sponges, not tissue or cotton balls. Peri-Wound Care Skin Prep Discharge Instruction: Use skin prep as directed Sween Lotion (Moisturizing lotion) Discharge Instruction: Apply moisturizing lotion as directed Topical [employer] compounding topical  antibiotics Discharge Instruction: apply daily directly to wound bed. Primary Dressing Hydrofera Blue Ready Foam, 4x5 in Discharge Instruction: Apply to wound bed as instructed Secondary Dressing Zetuvit Plus Silicone Border Dressing 4x4 (in/in) Discharge Instruction: Apply silicone border over primary dressing as directed. Secured With Compression Wrap Compression Stockings Environmental education officer) Signed: 12/23/2021 5:12:34 PM By: Deon Pilling RN, BSN Entered By: Deon Pilling on 12/23/2021 14:27:09 -------------------------------------------------------------------------------- Vitals Details Patient Name: Date of Service: [name] 12/23/2021 2:30 PM Medical Record Number: [id number] Patient Account Number: [id number] Date of Birth/Sex: Treating RN: 1934-03-07 (88 y.o. M) [name], [name]  Primary Care Doaa Kendzierski: [name] Other Clinician: Referring Sarahgrace Broman: Treating Bela Nyborg/Extender: [name] in Treatment: 12 Vital Signs Time Taken: 14:20 Temperature (F): 98 Height (in): 69 Pulse (bpm): 62 Weight (lbs): 168 Respiratory Rate (breaths/min): 20 Body Mass Index (BMI): 24.8 Blood Pressure (mmHg): 147/73 Reference Range: 80 - 120 mg / dl Electronic Signature(s) Signed: 12/23/2021 5:12:34 PM By: Deon Pilling RN, BSN Entered By: Deon Pilling on 12/23/2021 14:26:06

## 2021-12-26 NOTE — Discharge Summary (Signed)
[name] was brought in as an outpatient for outpatient surgery and was discharged from PACU.  See operative note for details.  An error was made and patient was considered "inpatient status" despite the above.  Dagoberto Ligas, PA-C [facility] [phone] 12/26/2021  10:59 AM

## 2021-12-29 ENCOUNTER — Encounter (HOSPITAL_BASED_OUTPATIENT_CLINIC_OR_DEPARTMENT_OTHER): Payer: Medicare Other | Admitting: Internal Medicine

## 2021-12-29 DIAGNOSIS — L97818 Non-pressure chronic ulcer of other part of right lower leg with other specified severity: Secondary | ICD-10-CM | POA: Diagnosis not present

## 2021-12-29 DIAGNOSIS — I70238 Atherosclerosis of native arteries of right leg with ulceration of other part of lower right leg: Secondary | ICD-10-CM | POA: Diagnosis not present

## 2021-12-29 DIAGNOSIS — S8011XD Contusion of right lower leg, subsequent encounter: Secondary | ICD-10-CM | POA: Diagnosis not present

## 2021-12-29 DIAGNOSIS — I87311 Chronic venous hypertension (idiopathic) with ulcer of right lower extremity: Secondary | ICD-10-CM | POA: Diagnosis not present

## 2021-12-30 NOTE — Progress Notes (Signed)
[name] ([id number]) Visit Report for 12/29/2021 Arrival Information Details Patient Name: Date of Service: [name] 12/29/2021 2:15 PM Medical Record Number: [id number] Patient Account Number: [id number] Date of Birth/Sex: Treating RN: [birth year] (88 y.o. [name], [name] Primary Care Hiliary Osorto: [name] Other Clinician: Referring Kendry Pfarr: Treating Najla Aughenbaugh/Extender: [name] in Treatment: 54 Visit Information History Since Last Visit Added or deleted any medications: No Patient Arrived: Cane Any new allergies or adverse reactions: No Arrival Time: 14:07 Had a fall or experienced change in No Accompanied By: daughter activities of daily living that may affect Transfer Assistance: None risk of falls: Patient Identification Verified: Yes Signs or symptoms of abuse/neglect since last visito No Secondary Verification Process Completed: Yes Hospitalized since last visit: No Patient Requires Transmission-Based Precautions: No Implantable device outside of the clinic excluding No Patient Has Alerts: Yes cellular tissue based products placed in the center Patient Alerts: [id number] to obtain;pain since last visit: Has Dressing in Place as Prescribed: Yes Pain Present Now: No Electronic Signature(s) Signed: 12/29/2021 5:30:04 PM By: Deon Pilling RN, BSN Entered By: Deon Pilling on 12/29/2021 14:09:18 -------------------------------------------------------------------------------- Encounter Discharge Information Details Patient Name: Date of Service: [name]. 12/29/2021 2:15 PM Medical Record Number: [id number] Patient Account Number: [id number] Date of Birth/Sex: Treating RN: 03/16/1934 (88 y.o. [name] Primary Care Dreyah Montrose: [name] Other Clinician: Referring Graceanna Theissen: Treating Vaniya Augspurger/Extender: [name] in Treatment: 13 Encounter Discharge Information Items Post Procedure  Vitals Discharge Condition: Stable Temperature (F): 97.6 Ambulatory Status: Cane Pulse (bpm): 61 Discharge Destination: Home Respiratory Rate (breaths/min): 20 Transportation: Private Auto Blood Pressure (mmHg): 137/68 Accompanied By: daughter Schedule Follow-up Appointment: Yes Clinical Summary of Care: Electronic Signature(s) Signed: 12/29/2021 5:30:04 PM By: Deon Pilling RN, BSN Entered By: Deon Pilling on 12/29/2021 14:39:13 -------------------------------------------------------------------------------- Lower Extremity Assessment Details Patient Name: Date of Service: [name]. 12/29/2021 2:15 PM Medical Record Number: [id number] Patient Account Number: [id number] Date of Birth/Sex: Treating RN: [birth year] (88 y.o. [name] Primary Care Robet Crutchfield: [name] Other Clinician: Referring Desarai Barrack: Treating Leith Szafranski/Extender: [name] in Treatment: 13 Edema Assessment Assessed: [name]: No] [name]: No] Edema: [Left: Ye] [Right: s] Calf Left: Right: Point of Measurement: 31 cm From Medial Instep 34.7 cm Ankle Left: Right: Point of Measurement: 7 cm From Medial Instep 27 cm Electronic Signature(s) Signed: 12/29/2021 4:28:59 PM By: Erenest Blank Signed: 12/29/2021 5:30:04 PM By: Deon Pilling RN, BSN Entered By: Erenest Blank on 12/29/2021 14:16:36 -------------------------------------------------------------------------------- Multi Wound Chart Details Patient Name: Date of Service: [name]. 12/29/2021 2:15 PM Medical Record Number: [id number] Patient Account Number: [id number] Date of Birth/Sex: Treating RN: [birth year] (88 y.o. 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 Secondary Etiology: Extremity Cataracts, Anemia, Asthma, Chronic N/A N/A Comorbid History: Obstructive Pulmonary Disease (COPD), Congestive Heart Failure, Coronary Artery Disease, Hypertension, Myocardial Infarction, Peripheral Arterial Disease, Type II Diabetes 07/02/2021 N/A N/A Date Acquired: 13 N/A N/A Weeks of Treatment: Open N/A N/A Wound Status: No N/A N/A Wound Recurrence: 2.9x0.9x0.2 N/A N/A Measurements L x W x D (cm) 2.05 N/A N/A A (cm) : rea 0.41 N/A  N/A Volume (cm) : 43.10% N/A N/A % Reduction in A rea: 71.60% N/A N/A % Reduction in Volume: Full Thickness Without Exposed N/A N/A Classification: Support Structures Medium N/A N/A Exudate A mount: Serosanguineous N/A N/A Exudate Type: red, brown N/A N/A Exudate Color: Thickened N/A N/A Wound Margin: Large (67-100%) N/A N/A Granulation A mount: Red, Pink N/A N/A Granulation Quality: Small (1-33%) N/A N/A Necrotic A mount: Fat Layer (Subcutaneous Tissue): Yes N/A N/A Exposed Structures: Fascia: No Tendon: No Muscle: No Joint: No Bone: No Medium (34-66%) N/A N/A Epithelialization: Debridement - Excisional N/A N/A Debridement: Pre-procedure Verification/Time Out 14:25 N/A N/A Taken: Lidocaine 5% topical ointment N/A N/A Pain Control: Subcutaneous, Slough N/A N/A Tissue Debrided: Skin/Subcutaneous Tissue N/A N/A Level: 2.25 N/A N/A Debridement A (sq cm): rea Curette N/A N/A Instrument: Minimum N/A N/A Bleeding: Pressure N/A N/A Hemostasis A chieved: 0 N/A N/A Procedural Pain: 0 N/A N/A Post Procedural Pain: Procedure was tolerated well N/A N/A Debridement Treatment Response: 2.9x0.9x0.2 N/A N/A Post Debridement Measurements L x W x D  (cm) 0.41 N/A N/A Post Debridement Volume: (cm) Debridement N/A N/A Procedures Performed: Treatment Notes Wound #1 (Lower Leg) Wound Laterality: Right, Anterior Cleanser Soap and Water Discharge Instruction: May shower and wash wound with dial antibacterial soap and water prior to dressing change. Wound Cleanser Discharge Instruction: Cleanse the wound with wound cleanser prior to applying a clean dressing using gauze sponges, not tissue or cotton balls. Peri-Wound Care Skin Prep Discharge Instruction: Use skin prep as directed Sween Lotion (Moisturizing lotion) Discharge Instruction: Apply moisturizing lotion as directed Topical [employer]stone compounding topical antibiotics Discharge Instruction: apply daily directly to wound bed. Primary Dressing Hydrofera Blue Ready Foam, 4x5 in Discharge Instruction: Apply to wound bed as instructed Secondary Dressing Zetuvit Plus Silicone Border Dressing 4x4 (in/in) Discharge Instruction: Apply silicone border over primary dressing as directed. Secured With Compression Wrap Compression Stockings Environmental education officer) Signed: 12/29/2021 5:30:04 PM By: Deon Pilling RN, BSN Signed: 12/30/2021 8:48:16 AM By: Kalman Shan DO Entered By: Kalman Shan on 12/29/2021 14:55:01 -------------------------------------------------------------------------------- Multi-Disciplinary Care Plan Details Patient Name: Date of Service: [name]. 12/29/2021 2:15 PM Medical Record Number: [id number] Patient Account Number: [id number] Date of Birth/Sex: Treating RN: [birth year] (87 y.o. [name] Primary Care Creighton Longley: [name] Other Clinician: Referring Jaydan Chretien: Treating Fedora Knisely/Extender: [name] in Treatment: 13 Active Inactive Tissue Oxygenation Nursing Diagnoses: Potential alteration in peripheral tissue perfusion (select prior to confirmation of diagnosis) Goals: Non-invasive arterial  studies are completed as ordered Date Initiated: 10/14/2021 Target Resolution Date: 01/30/2022 Goal Status: Active Interventions: Assess patient understanding of disease process and management upon diagnosis and as needed Assess peripheral arterial status upon admission and as needed Provide education on tissue oxygenation and ischemia Treatment Activities: Ankle Brachial Index (ABI) : 09/30/2021 Non-invasive vascular studies : 10/14/2021 Notes: Wound/Skin Impairment Nursing Diagnoses: Knowledge deficit related to ulceration/compromised skin integrity Goals: Patient/caregiver will verbalize understanding of skin care regimen Date Initiated: 09/26/2021 Target Resolution Date: 01/30/2022 Goal Status: Active Interventions: Assess patient/caregiver ability to perform ulcer/skin care regimen upon admission and as needed Assess ulceration(s) every visit Provide education on ulcer and skin care Treatment Activities: Skin care regimen initiated : 09/26/2021 Topical wound management initiated : 09/26/2021 Notes: 10/21/21: Wound care regimen continues, using [employer] and [employer]. Electronic Signature(s) Signed: 12/29/2021 5:30:04 PM By: Deon Pilling RN, BSN Entered By: Deon Pilling on 12/29/2021 14:38:13 -------------------------------------------------------------------------------- Pain Assessment Details Patient Name: Date of Service: [name]. 12/29/2021 2:15 PM Medical  Record Number: [id number] Patient Account Number: [id number] Date of Birth/Sex: Treating RN: 13-Feb-1934 (88 y.o. [name] Primary Care Dontae Minerva: [name] Other Clinician: Referring Mairead Schwarzkopf: Treating Antonina Deziel/Extender: [name] in Treatment: 13 Active Problems Location of Pain Severity and Description of Pain Patient Has Paino No Site Locations Rate the pain. Current Pain Level: 0 Pain Management and Medication Current Pain Management: Medication: No Cold  Application: No Rest: No Massage: No Activity: No T.E.N.S.: No Heat Application: No Leg drop or elevation: No Is the Current Pain Management Adequate: Adequate How does your wound impact your activities of daily livingo Sleep: No Bathing: No Appetite: No Relationship With Others: No Bladder Continence: No Emotions: No Bowel Continence: No Work: No Toileting: No Drive: No Dressing: No Hobbies: No Engineer, maintenance) Signed: 12/29/2021 5:30:04 PM By: Deon Pilling RN, BSN Entered By: Deon Pilling on 12/29/2021 14:09:38 -------------------------------------------------------------------------------- Patient/Caregiver Education Details Patient Name: Date of Service: [name] 7/31/2023andnbsp2:15 PM Medical Record Number: [id number] Patient Account Number: [id number] Date of Birth/Gender: Treating RN: 06-12-1933 (88 y.o. [name] Primary Care Physician: [name] Other Clinician: Referring Physician: Treating Physician/Extender: [name] in Treatment: 13 Education Assessment Education Provided To: Patient Education Topics Provided Wound/Skin Impairment: Handouts: Skin Care Do's and Dont's Methods: Explain/Verbal Responses: State content correctly Electronic Signature(s) Signed: 12/29/2021 5:30:04 PM By: Deon Pilling RN, BSN Entered By: Deon Pilling on 12/29/2021 14:38:26 -------------------------------------------------------------------------------- Wound Assessment Details Patient Name: Date of Service: [name]. 12/29/2021 2:15 PM Medical Record Number: [id number] Patient Account Number: [id number] Date of Birth/Sex: Treating RN: [birth year] (88 y.o. [name], Meta.Reding Primary Care Arjen Deringer: [name] Other Clinician: Referring Donyale Falcon: Treating Sylena Lotter/Extender: [name] in Treatment: 13 Wound Status Wound Number: 1 Primary Abrasion Etiology: Wound Location:  Right, Anterior Lower Leg Secondary Diabetic Wound/Ulcer of the Lower Extremity Wounding Event: Trauma Etiology: Date Acquired: 07/02/2021 Wound Open Weeks Of Treatment: 13 Status: Clustered Wound: No Comorbid Cataracts, Anemia, Asthma, Chronic Obstructive Pulmonary History: Disease (COPD), Congestive Heart Failure, Coronary Artery Disease, Hypertension, Myocardial Infarction, Peripheral Arterial Disease, Type II Diabetes Photos Wound Measurements Length: (cm) 2.9 Width: (cm) 0.9 Depth: (cm) 0.2 Area: (cm) 2.05 Volume: (cm) 0.41 % Reduction in Area: 43.1% % Reduction in Volume: 71.6% Epithelialization: Medium (34-66%) Tunneling: No Undermining: No Wound Description Classification: Full Thickness Without Exposed Support Structures Wound Margin: Thickened Exudate Amount: Medium Exudate Type: Serosanguineous Exudate Color: red, brown Foul Odor After Cleansing: No Slough/Fibrino Yes Wound Bed Granulation Amount: Large (67-100%) Exposed Structure Granulation Quality: Red, Pink Fascia Exposed: No Necrotic Amount: Small (1-33%) Fat Layer (Subcutaneous Tissue) Exposed: Yes Necrotic Quality: Adherent Slough Tendon Exposed: No Muscle Exposed: No Joint Exposed: No Bone Exposed: No Treatment Notes Wound #1 (Lower Leg) Wound Laterality: Right, Anterior Cleanser Soap and Water Discharge Instruction: May shower and wash wound with dial antibacterial soap and water prior to dressing change. Wound Cleanser Discharge Instruction: Cleanse the wound with wound cleanser prior to applying a clean dressing using gauze sponges, not tissue or cotton balls. Peri-Wound Care Skin Prep Discharge Instruction: Use skin prep as directed Sween Lotion (Moisturizing lotion) Discharge Instruction: Apply moisturizing lotion as directed Topical [employer] compounding topical antibiotics Discharge Instruction: apply daily directly to wound bed. Primary Dressing Hydrofera Blue Ready Foam, 4x5  in Discharge Instruction: Apply to wound bed as instructed Secondary Dressing Zetuvit Plus Silicone Border Dressing 4x4 (in/in) Discharge Instruction: Apply silicone border over primary dressing as directed. Secured With Compression Wrap Compression Stockings Add-Ons  Electronic Signature(s) Signed: 12/29/2021 4:28:59 PM By: Erenest Blank Signed: 12/29/2021 5:30:04 PM By: Deon Pilling RN, BSN Entered By: Erenest Blank on 12/29/2021 14:19:36 -------------------------------------------------------------------------------- Vitals Details Patient Name: Date of Service: [name]. 12/29/2021 2:15 PM Medical Record Number: [id number] Patient Account Number: [id number] Date of Birth/Sex: Treating RN: 01/06/1934 (88 y.o. [name], [name] Primary Care Gerhart Ruggieri: [name] Other Clinician: Referring Lyndzie Zentz: Treating Boniface Goffe/Extender: [name] in Treatment: 13 Vital Signs Time Taken: 14:07 Temperature (F): 97.6 Height (in): 69 Pulse (bpm): 61 Weight (lbs): 168 Respiratory Rate (breaths/min): 22 Body Mass Index (BMI): 24.8 Blood Pressure (mmHg): 137/68 Reference Range: 80 - 120 mg / dl Electronic Signature(s) Signed: 12/29/2021 5:30:04 PM By: Deon Pilling RN, BSN Entered By: Deon Pilling on 12/29/2021 14:09:31

## 2021-12-30 NOTE — Progress Notes (Signed)
Jonathon, Shea ([id number]) Visit Report for 12/29/2021 Chief Complaint Document Details Patient Name: Date of Service: [name], [name] 12/29/2021 2:15 PM Medical Record Number: [id number] Patient Account Number: [id number] Date of Birth/Sex: Treating RN: August 21, 1933 (88 y.o. [name] Primary Care Provider: [name] Other Clinician: Referring Provider: Treating Provider/Extender: [name] in Treatment: 13 Information Obtained from: Patient Chief Complaint 09/26/2021; patient is here for a review of a complicated wound on the right anterior lower leg which was initially secondary to trauma Electronic Signature(s) Signed: 12/30/2021 8:48:16 AM By: Kalman Shan DO Entered By: Kalman Shan on 12/29/2021 14:55:12 -------------------------------------------------------------------------------- Debridement Details Patient Name: Date of Service: [name]. 12/29/2021 2:15 PM Medical Record Number: [id number] Patient Account Number: [id number] Date of Birth/Sex: Treating RN: 07-31-1933 (88 y.o. [name], Meta.Reding Primary Care Provider: [name] Other Clinician: Referring Provider: Treating Provider/Extender: [name] in Treatment: 13 Debridement Performed for Assessment: Wound #1 Right,Anterior Lower Leg Performed By: Physician Kalman Shan, DO Debridement Type: Debridement Severity of Tissue Pre Debridement: Fat layer exposed Level of Consciousness (Pre-procedure): Awake and Alert Pre-procedure Verification/Time Out Yes - 14:25 Taken: Start Time: 14:26 Pain Control: Lidocaine 5% topical ointment T Area Debrided (L x W): otal 2.5 (cm) x 0.9 (cm) = 2.25 (cm) Tissue and other material debrided: Viable, Non-Viable, Slough, Subcutaneous, Slough Level: Skin/Subcutaneous Tissue Debridement Description: Excisional Instrument: Curette Bleeding: Minimum Hemostasis Achieved: Pressure End Time:  14:33 Procedural Pain: 0 Post Procedural Pain: 0 Response to Treatment: Procedure was tolerated well Level of Consciousness (Post- Awake and Alert procedure): Post Debridement Measurements of Total Wound Length: (cm) 2.9 Width: (cm) 0.9 Depth: (cm) 0.2 Volume: (cm) 0.41 Character of Wound/Ulcer Post Debridement: Improved Severity of Tissue Post Debridement: Fat layer exposed Post Procedure Diagnosis Same as Pre-procedure Electronic Signature(s) Signed: 12/29/2021 5:30:04 PM By: Deon Pilling RN, BSN Signed: 12/30/2021 8:48:16 AM By: Kalman Shan DO Entered By: Deon Pilling on 12/29/2021 14:34:15 -------------------------------------------------------------------------------- HPI Details Patient Name: Date of Service: [name]. 12/29/2021 2:15 PM Medical Record Number: [id number] Patient Account Number: [id number] Date of Birth/Sex: Treating RN: Sep 09, 1933 (88 y.o. [name] Primary Care Provider: [name] Other Clinician: Referring Provider: Treating Provider/Extender: [name] in Treatment: 13 History of Present Illness HPI Description: ADMISSION 09/26/21 This is a 88 year old man who lives near [place]. He is accompanied by his daughter who lives with him. His wife is also at home. Apparently 2 months ago he traumatized the right anterior lower leg x2 in quick succession including once on the back of a pickup truck or car. By description it sounds as though he had a black eschar on this which fell off about a month ago. He has been left with an open wound. It this is not that large but is completely slough covered and has undermining of at least a centimeter and half from 12-6 o'clock. He received a course of antibiotics earlier this month from his primary care doctor Ceftin. He is not on antibiotics currently. The patient has a complicated relevant history. 2019 he underwent bilateral common iliac artery stenting and  angioplasties by Dr. Carlis Abbott. He had ABIs at the same time which showed a ABI at 0.55 on the right and a TBI of 0.38. He had a TBI of 0.43 on the left they could not do an ABI because he could not tolerate the pressure cuff. He has not followed up. The patient is apparently a diabetic although he has not  been on any treatment in years and its not clear that he is even followed up. Other past medical history includes coronary artery disease, ischemic cardiomyopathy status post pacemaker, he is on Plavix, He did not cooperate with our attempt to do ABIs on the right leg in our clinic 5/5; patient presents for follow-up. Last week he was started on Iodosorb under Kerlix/Coban. He has no issues or complaints today. He has his venous and arterial studies scheduled for next week. He currently denies signs of infection. 5/16; patient presents for follow-up. Last week he was started on Hydrofera Blue and gentamicin and mupirocin ointment under Kerlix/Coban. He tolerated this well. He had a PCR culture that reported high levels of Streptococcus agalactiae, Pseudomonas aeruginosa and Enterococcus bacillus. Keystone antibiotics were ordered and he has this however did not bring it today. He had ABIs with TBI's and venous reflux studies. He had evidence of reflux throughout the right and left venous system. His ABIs on the right was noncompressible with a TBI of 0.19. He currently denies systemic signs of infection. 5/23; patient presents for follow-up. He has been using Keystone antibiotic to the wound bed without issues. He currently denies signs of infection. He has not heard back from Dr. Kennon Holter office. 6/8; patient is back his wound is somewhat better with less undermining but still the same basic surface area. The wound surface looks better in terms of nonviable material. But the other issue that concerns me is this patient probably has some minimal distance claudication and may even have claudication  at rest at night in the right leg. Referencing my notes from his admission he has had previous bilateral common iliac artery stenting and angioplasties in 2019. For some reason I do not know that he ever had any follow-up. 6/19; patient presents for follow-up. He saw Dr. Luan Pulling on 6/16 that recommended a CT angiogram to evaluate iliac stents and right common femoral disease. Patient is using Keystone antibiotic with [facility] without issues. He denies signs of infection. 6/26; patient presents for follow-up. He had a CT angiogram that showed a patent common iliac artery. There is advanced right femoral popliteal disease including SFA stenosis with plaque extending into the SFA and profunda femoris. 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 He has an appointment scheduled tomorrow with Dr. Roselie Awkward to review these results. 7/10; patient presents for follow-up. He is scheduled for right femoral endarterectomy on 7/13 with Dr. Roselie Awkward. He has developed a smaller wound more proximal to the original wound. He is not sure how this started. He has been using Keystone antibiotic and Hydrofera Blue to the wound bed. He denies signs of infection. 7/18; patient presents for follow-up. On 7/13 he had a right lower extremity angiogram with right common femoral and profunda femoris artery angioplasty and stenting. The previous plan for femoral endarterectomy was canceled. He has been using Keystone and [facility] to the wound bed. He currently denies signs of infection. 7/25; patient presents for follow-up. He has been using Keystone and [facility] to the wound bed. He reports improvement in his chronic pain and wound healing. 7/31; patient presents for follow-up. He has been using Keystone and [facility] to the wound bed. He has no issues or complaints today. Electronic Signature(s) Signed: 12/30/2021 8:48:16 AM By: Kalman Shan  DO Entered By: Kalman Shan on 12/29/2021 14:55:30 -------------------------------------------------------------------------------- Physical Exam Details Patient Name: Date of Service: [name]. 12/29/2021 2:15 PM Medical Record  Number: [id number] Patient Account Number: [id number] Date of Birth/Sex: Treating RN: 08-Jun-1933 (88 y.o. [name] Primary Care Provider: [name] Other Clinician: Referring Provider: Treating Provider/Extender: [name] in Treatment: 13 Constitutional respirations regular, non-labored and within target range for patient.. Cardiovascular 2+ dorsalis pedis/posterior tibialis pulses. Psychiatric pleasant and cooperative. Notes Right lower extremity: T the mid tibial aspect there is an open wound with granulation tissue and nonviable tissue. No signs of surrounding infection. o Electronic Signature(s) Signed: 12/30/2021 8:48:16 AM By: Kalman Shan DO Entered By: Kalman Shan on 12/29/2021 14:56:24 -------------------------------------------------------------------------------- Physician Orders Details Patient Name: Date of Service: [name] 12/29/2021 2:15 PM Medical Record Number: [id number] Patient Account Number: [id number] Date of Birth/Sex: Treating RN: July 29, 1933 (88 y.o. [name] Primary Care Provider: [name] Other Clinician: Referring Provider: Treating Provider/Extender: [name] in Treatment: [phone] Verbal / Phone Orders: No Diagnosis Coding Follow-up Appointments ppointment in 1 week. - Tuesday 230pm 01/06/2022 Dr. Heber [employer] and [place] [facility] Room 9 Return A Return appointment in 3 weeks. - Tuesday 215pm 01/19/2022 Dr. Heber [place] and [place] Room 8 Other: - Bring keystone topical antibiotics in at each visit. Ensure to follow up with Dr. Mora Appl [facility] on 01/09/2022 and 01/13/2022 Bathing/ Shower/ Hygiene May shower with protection but do not get wound  dressing(s) wet. Edema Control - Lymphedema / SCD / Other Elevate legs to the level of the heart or above for 30 minutes daily and/or when sitting, a frequency of: - 3-4 times a day throughout the day. Avoid standing for long periods of time. Exercise regularly Wound Treatment Wound #1 - Lower Leg Wound Laterality: Right, Anterior Cleanser: Soap and Water 1 x Per Day/30 Days Discharge Instructions: May shower and wash wound with dial antibacterial soap and water prior to dressing change. Cleanser: Wound Cleanser (Generic) 1 x Per Day/30 Days Discharge Instructions: Cleanse the wound with wound cleanser prior to applying a clean dressing using gauze sponges, not tissue or cotton balls. Peri-Wound Care: Skin Prep (Generic) 1 x Per Day/30 Days Discharge Instructions: Use skin prep as directed Peri-Wound Care: Sween Lotion (Moisturizing lotion) 1 x Per Day/30 Days Discharge Instructions: Apply moisturizing lotion as directed Topical: Keystone compounding topical antibiotics 1 x Per Day/30 Days Discharge Instructions: apply daily directly to wound bed. Prim Dressing: Hydrofera Blue Ready Foam, 4x5 in 1 x Per Day/30 Days ary Discharge Instructions: Apply to wound bed as instructed Secondary Dressing: Zetuvit Plus Silicone Border Dressing 4x4 (in/in) (Generic) 1 x Per Day/30 Days Discharge Instructions: Apply silicone border over primary dressing as directed. Electronic Signature(s) Signed: 12/30/2021 8:48:16 AM By: Kalman Shan DO Entered By: Kalman Shan on 12/29/2021 14:56:31 -------------------------------------------------------------------------------- Problem List Details Patient Name: Date of Service: [name] 12/29/2021 2:15 PM Medical Record Number: [id number] Patient Account Number: [id number] Date of Birth/Sex: Treating RN: 12/26/1933 (88 y.o. [name], Meta.Reding Primary Care Provider: [name] Other Clinician: Referring Provider: Treating Provider/Extender:  [name] in Treatment: 13 Active Problems ICD-10 Encounter Code Description Active Date MDM Diagnosis S80.11XD Contusion of right lower leg, subsequent encounter 09/26/2021 No Yes L97.818 Non-pressure chronic ulcer of other part of right lower leg with other specified 09/26/2021 No Yes severity I87.311 Chronic venous hypertension (idiopathic) with ulcer of right lower extremity 09/26/2021 No Yes I70.238 Atherosclerosis of native arteries of right leg with ulceration of other part of 10/14/2021 No Yes lower leg Inactive Problems Resolved Problems Electronic Signature(s) Signed: 12/30/2021 8:48:16 AM By: Kalman Shan DO Entered  By: Kalman Shan on 12/29/2021 14:54:57 -------------------------------------------------------------------------------- Progress Note Details Patient Name: Date of Service: KYLYN, SOOKRAM 12/29/2021 2:15 PM Medical Record Number: [id number] Patient Account Number: [id number] Date of Birth/Sex: Treating RN: 1934-04-21 (88 y.o. [name] Primary Care Provider: [name] Other Clinician: Referring Provider: Treating Provider/Extender: [name] in Treatment: 13 Subjective Chief Complaint Information obtained from Patient 09/26/2021; patient is here for a review of a complicated wound on the right anterior lower leg which was initially secondary to trauma History of Present Illness (HPI) ADMISSION 09/26/21 This is a 83 year old man who lives near [place]. He is accompanied by his daughter who lives with him. His wife is also at home. Apparently 2 months ago he traumatized the right anterior lower leg x2 in quick succession including once on the back of a pickup truck or car. By description it sounds as though he had a black eschar on this which fell off about a month ago. He has been left with an open wound. It this is not that large but is completely slough covered and has undermining of at  least a centimeter and half from 12-6 o'clock. He received a course of antibiotics earlier this month from his primary care doctor Ceftin. He is not on antibiotics currently. The patient has a complicated relevant history. 2019 he underwent bilateral common iliac artery stenting and angioplasties by Dr. Carlis Abbott. He had ABIs at the same time which showed a ABI at 0.55 on the right and a TBI of 0.38. He had a TBI of 0.43 on the left they could not do an ABI because he could not tolerate the pressure cuff. He has not followed up. The patient is apparently a diabetic although he has not been on any treatment in years and its not clear that he is even followed up. Other past medical history includes coronary artery disease, ischemic cardiomyopathy status post pacemaker, he is on Plavix, He did not cooperate with our attempt to do ABIs on the right leg in our clinic 5/5; patient presents for follow-up. Last week he was started on Iodosorb under Kerlix/Coban. He has no issues or complaints today. He has his venous and arterial studies scheduled for next week. He currently denies signs of infection. 5/16; patient presents for follow-up. Last week he was started on Hydrofera Blue and gentamicin and mupirocin ointment under Kerlix/Coban. He tolerated this well. He had a PCR culture that reported high levels of Streptococcus agalactiae, Pseudomonas aeruginosa and Enterococcus bacillus. Keystone antibiotics were ordered and he has this however did not bring it today. He had ABIs with TBI's and venous reflux studies. He had evidence of reflux throughout the right and left venous system. His ABIs on the right was noncompressible with a TBI of 0.19. He currently denies systemic signs of infection. 5/23; patient presents for follow-up. He has been using Keystone antibiotic to the wound bed without issues. He currently denies signs of infection. He has not heard back from Dr. Kennon Holter office. 6/8; patient is back his  wound is somewhat better with less undermining but still the same basic surface area. The wound surface looks better in terms of nonviable material. But the other issue that concerns me is this patient probably has some minimal distance claudication and may even have claudication at rest at night in the right leg. Referencing my notes from his admission he has had previous bilateral common iliac artery stenting and angioplasties in 2019. For some reason I do not know  that he ever had any follow-up. 6/19; patient presents for follow-up. He saw Dr. Luan Pulling on 6/16 that recommended a CT angiogram to evaluate iliac stents and right common femoral disease. Patient is using Keystone antibiotic with [facility] without issues. He denies signs of infection. 6/26; patient presents for follow-up. He had a CT angiogram that showed a patent common iliac artery. There is advanced right femoral popliteal disease including SFA stenosis with plaque extending into the SFA and profunda femoris. 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 He has an appointment scheduled tomorrow with Dr. Roselie Awkward to review these results. 7/10; patient presents for follow-up. He is scheduled for right femoral endarterectomy on 7/13 with Dr. Roselie Awkward. He has developed a smaller wound more proximal to the original wound. He is not sure how this started. He has been using Keystone antibiotic and Hydrofera Blue to the wound bed. He denies signs of infection. 7/18; patient presents for follow-up. On 7/13 he had a right lower extremity angiogram with right common femoral and profunda femoris artery angioplasty and stenting. The previous plan for femoral endarterectomy was canceled. He has been using Keystone and [facility] to the wound bed. He currently denies signs of infection. 7/25; patient presents for follow-up. He has been using Keystone and [facility] to the wound bed. He  reports improvement in his chronic pain and wound healing. 7/31; patient presents for follow-up. He has been using Keystone and [facility] to the wound bed. He has no issues or complaints today. Patient History Information obtained from Patient, Chart. Family History Cancer - Siblings, Kidney Disease - Father. Social History Former smoker - quit 1985, Marital Status - Married, Alcohol Use - Never, Drug Use - No History, Caffeine Use - Daily. Medical History Eyes Patient has history of Cataracts - Extraction 2018 Hematologic/Lymphatic Patient has history of Anemia Respiratory Patient has history of Asthma, Chronic Obstructive Pulmonary Disease (COPD) Cardiovascular Patient has history of Congestive Heart Failure, Coronary Artery Disease, Hypertension, Myocardial Infarction - 2009, Peripheral Arterial Disease Endocrine Patient has history of Type II Diabetes - Diet Controlled-does not take any medications Hospitalization/Surgery History - angioplasty stent- 2019 dr.Clark. - carotid stents. Medical A Surgical History Notes nd Eyes thyroid eye disease, hypertrophia of right eye, hypotropia of left eye, diplopia- corrected per daughter Cardiovascular Pacemaker/defibrillator Endocrine Thyroiditis, Hypothyroidism Objective Constitutional respirations regular, non-labored and within target range for patient.. Vitals Time Taken: 2:07 PM, Height: 69 in, Weight: 168 lbs, BMI: 24.8, Temperature: 97.6 F, Pulse: 61 bpm, Respiratory Rate: 22 breaths/min, Blood Pressure: 137/68 mmHg. Cardiovascular 2+ dorsalis pedis/posterior tibialis pulses. Psychiatric pleasant and cooperative. General Notes: Right lower extremity: T the mid tibial aspect there is an open wound with granulation tissue and nonviable tissue. No signs of surrounding o infection. Integumentary (Hair, Skin) Wound #1 status is Open. Original cause of wound was Trauma. The date acquired was: 07/02/2021. The wound has been in  treatment 13 weeks. The wound is located on the Right,Anterior Lower Leg. The wound measures 2.9cm length x 0.9cm width x 0.2cm depth; 2.05cm^2 area and 0.41cm^3 volume. There is Fat Layer (Subcutaneous Tissue) exposed. There is no tunneling or undermining noted. There is a medium amount of serosanguineous drainage noted. The wound margin is thickened. There is large (67-100%) red, pink granulation within the wound bed. There is a small (1-33%) amount of necrotic tissue within the wound bed including Adherent Slough. Assessment Active Problems ICD-10 Contusion of right lower leg, subsequent encounter Non-pressure  chronic ulcer of other part of right lower leg with other specified severity Chronic venous hypertension (idiopathic) with ulcer of right lower extremity Atherosclerosis of native arteries of right leg with ulceration of other part of lower leg Patient's wound has shown improvement in size in appearance since last clinic visit. I debrided nonviable tissue. Recommended continuing the course with [facility] antibiotic and Hydrofera Blue. He has follow-up with vein and vascular on 8/15. Procedures Wound #1 Pre-procedure diagnosis of Wound #1 is an Abrasion located on the Right,Anterior Lower Leg .Severity of Tissue Pre Debridement is: Fat layer exposed. There was a Excisional Skin/Subcutaneous Tissue Debridement with a total area of 2.25 sq cm performed by Kalman Shan, DO. With the following instrument(s): Curette to remove Viable and Non-Viable tissue/material. Material removed includes Subcutaneous Tissue and Slough and after achieving pain control using Lidocaine 5% topical ointment. A time out was conducted at 14:25, prior to the start of the procedure. A Minimum amount of bleeding was controlled with Pressure. The procedure was tolerated well with a pain level of 0 throughout and a pain level of 0 following the procedure. Post Debridement Measurements: 2.9cm length x 0.9cm width x  0.2cm depth; 0.41cm^3 volume. Character of Wound/Ulcer Post Debridement is improved. Severity of Tissue Post Debridement is: Fat layer exposed. Post procedure Diagnosis Wound #1: Same as Pre-Procedure Plan Follow-up Appointments: Return Appointment in 1 week. - Tuesday 230pm 01/06/2022 Dr. Heber [place] and [facility] Room 9 Return appointment in 3 weeks. - Tuesday 215pm 01/19/2022 Dr. Heber [place] and [place] Room 8 Other: - Bring keystone topical antibiotics in at each visit. Ensure to follow up with Dr. Mora Appl [facility] on 01/09/2022 and 01/13/2022 Bathing/ Shower/ Hygiene: May shower with protection but do not get wound dressing(s) wet. Edema Control - Lymphedema / SCD / Other: Elevate legs to the level of the heart or above for 30 minutes daily and/or when sitting, a frequency of: - 3-4 times a day throughout the day. Avoid standing for long periods of time. Exercise regularly WOUND #1: - Lower Leg Wound Laterality: Right, Anterior Cleanser: Soap and Water 1 x Per Day/30 Days Discharge Instructions: May shower and wash wound with dial antibacterial soap and water prior to dressing change. Cleanser: Wound Cleanser (Generic) 1 x Per Day/30 Days Discharge Instructions: Cleanse the wound with wound cleanser prior to applying a clean dressing using gauze sponges, not tissue or cotton balls. Peri-Wound Care: Skin Prep (Generic) 1 x Per Day/30 Days Discharge Instructions: Use skin prep as directed Peri-Wound Care: Sween Lotion (Moisturizing lotion) 1 x Per Day/30 Days Discharge Instructions: Apply moisturizing lotion as directed Topical: Keystone compounding topical antibiotics 1 x Per Day/30 Days Discharge Instructions: apply daily directly to wound bed. Prim Dressing: Hydrofera Blue Ready Foam, 4x5 in 1 x Per Day/30 Days ary Discharge Instructions: Apply to wound bed as instructed Secondary Dressing: Zetuvit Plus Silicone Border Dressing 4x4 (in/in) (Generic) 1 x Per Day/30 Days Discharge Instructions: Apply  silicone border over primary dressing as directed. 1. In office sharp debridement 2. Keystone and [employer] 3. Follow-up in 1 week Electronic Signature(s) Signed: 12/30/2021 8:48:16 AM By: Kalman Shan DO Entered By: Kalman Shan on 12/29/2021 14:57:25 -------------------------------------------------------------------------------- HxROS Details Patient Name: Date of Service: [name]. 12/29/2021 2:15 PM Medical Record Number: [id number] Patient Account Number: [id number] Date of Birth/Sex: Treating RN: June 21, 1933 (88 y.o. [name] Primary Care Provider: [name] Other Clinician: Referring Provider: Treating Provider/Extender: [name] in Treatment: 13 Information Obtained From Patient Chart  Eyes Medical History: Positive for: Cataracts - Extraction 2018 Past Medical History Notes: thyroid eye disease, hypertrophia of right eye, hypotropia of left eye, diplopia- corrected per daughter Hematologic/Lymphatic Medical History: Positive for: Anemia Respiratory Medical History: Positive for: Asthma; Chronic Obstructive Pulmonary Disease (COPD) Cardiovascular Medical History: Positive for: Congestive Heart Failure; Coronary Artery Disease; Hypertension; Myocardial Infarction - 2009; Peripheral Arterial Disease Past Medical History Notes: Pacemaker/defibrillator Endocrine Medical History: Positive for: Type II Diabetes - Diet Controlled-does not take any medications Past Medical History Notes: Thyroiditis, Hypothyroidism Time with diabetes: 2019 Treated with: Diet Blood sugar tested every day: No HBO Extended History Items Eyes: Cataracts Immunizations Pneumococcal Vaccine: Received Pneumococcal Vaccination: No Implantable Devices Yes Hospitalization / Surgery History Type of Hospitalization/Surgery angioplasty stent- 2019 dr.Clark carotid stents Family and Social History Cancer: Yes - Siblings; Kidney  Disease: Yes - Father; Former smoker - quit 1985; Marital Status - Married; Alcohol Use: Never; Drug Use: No History; Caffeine Use: Daily; Financial Concerns: No; Food, Clothing or Shelter Needs: No; Support System Lacking: No; Transportation Concerns: No Engineer, maintenance) Signed: 12/29/2021 5:30:04 PM By: Deon Pilling RN, BSN Signed: 12/30/2021 8:48:16 AM By: Kalman Shan DO Entered By: Kalman Shan on 12/29/2021 14:55:46 -------------------------------------------------------------------------------- SuperBill Details Patient Name: Date of Service: [name] 12/29/2021 Medical Record Number: [id number] Patient Account Number: [id number] Date of Birth/Sex: Treating RN: Mar 22, 1934 (88 y.o. 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

## 2022-01-06 ENCOUNTER — Encounter (HOSPITAL_BASED_OUTPATIENT_CLINIC_OR_DEPARTMENT_OTHER): Payer: Medicare Other | Attending: Internal Medicine | Admitting: Internal Medicine

## 2022-01-06 DIAGNOSIS — L97818 Non-pressure chronic ulcer of other part of right lower leg with other specified severity: Secondary | ICD-10-CM | POA: Diagnosis not present

## 2022-01-06 DIAGNOSIS — S8011XA Contusion of right lower leg, initial encounter: Secondary | ICD-10-CM | POA: Diagnosis not present

## 2022-01-06 DIAGNOSIS — X58XXXA Exposure to other specified factors, initial encounter: Secondary | ICD-10-CM | POA: Diagnosis not present

## 2022-01-06 DIAGNOSIS — Z7902 Long term (current) use of antithrombotics/antiplatelets: Secondary | ICD-10-CM | POA: Diagnosis not present

## 2022-01-06 DIAGNOSIS — I70238 Atherosclerosis of native arteries of right leg with ulceration of other part of lower right leg: Secondary | ICD-10-CM | POA: Diagnosis not present

## 2022-01-06 DIAGNOSIS — I87311 Chronic venous hypertension (idiopathic) with ulcer of right lower extremity: Secondary | ICD-10-CM | POA: Diagnosis not present

## 2022-01-06 DIAGNOSIS — I255 Ischemic cardiomyopathy: Secondary | ICD-10-CM | POA: Diagnosis not present

## 2022-01-06 DIAGNOSIS — Z950 Presence of cardiac pacemaker: Secondary | ICD-10-CM | POA: Diagnosis not present

## 2022-01-06 DIAGNOSIS — I2510 Atherosclerotic heart disease of native coronary artery without angina pectoris: Secondary | ICD-10-CM | POA: Insufficient documentation

## 2022-01-06 NOTE — Progress Notes (Signed)
DEION, SWIFT ([id number]) Visit Report for 01/06/2022 Arrival Information Details Patient Name: Date of Service: [name], [name] 01/06/2022 2:30 PM Medical Record Number: [id number] Patient Account Number: [id number] Date of Birth/Sex: Treating RN: 1934-05-12 (88 y.o. [name] Primary Care Maebry Obrien: [name] Other Clinician: Referring Tatum Corl: Treating Demeco Ducksworth/Extender: [name] in Treatment: 14 Visit Information History Since Last Visit Added or deleted any medications: No Patient Arrived: Cane Any new allergies or adverse reactions: No Arrival Time: 14:15 Had a fall or experienced change in No Accompanied By: daughter activities of daily living that may affect Transfer Assistance: None risk of falls: Patient Identification Verified: Yes Signs or symptoms of abuse/neglect since last visito No Secondary Verification Process Completed: Yes Hospitalized since last visit: No Patient Requires Transmission-Based Precautions: No Implantable device outside of the clinic excluding No Patient Has Alerts: Yes cellular tissue based products placed in the center Patient Alerts: [id number]:[id number] to obtain;pain since last visit: Has Dressing in Place as Prescribed: Yes Pain Present Now: No Electronic Signature(s) Signed: 01/06/2022 5:29:00 PM By: Deon Pilling RN, BSN Entered By: Deon Pilling on 01/06/2022 14:20:15 -------------------------------------------------------------------------------- Encounter Discharge Information Details Patient Name: Date of Service: [name]. 01/06/2022 2:30 PM Medical Record Number: [id number] Patient Account Number: [id number] Date of Birth/Sex: Treating RN: [birth year] (88 y.o. [name] Primary Care Harold Mattes: [name] Other Clinician: Referring Vinod Mikesell: Treating Philipp Callegari/Extender: [name] in Treatment: 14 Encounter Discharge Information Items Post Procedure  Vitals Discharge Condition: Stable Temperature (F): 97.8 Ambulatory Status: Cane Pulse (bpm): 60 Discharge Destination: Home Respiratory Rate (breaths/min): 22 Transportation: Private Auto Blood Pressure (mmHg): 138/76 Accompanied By: daughter Schedule Follow-up Appointment: Yes Clinical Summary of Care: Electronic Signature(s) Signed: 01/06/2022 5:29:00 PM By: Deon Pilling RN, BSN Entered By: Deon Pilling on 01/06/2022 14:29:46 -------------------------------------------------------------------------------- Lower Extremity Assessment Details Patient Name: Date of Service: [name]. 01/06/2022 2:30 PM Medical Record Number: [id number] Patient Account Number: [id number] Date of Birth/Sex: Treating RN: 1933-07-11 (88 y.o. [name] Primary Care Kikue Gerhart: [name] Other Clinician: Referring Tramaine Sauls: Treating Andric Kerce/Extender: [name] in Treatment: 14 Edema Assessment Assessed: [name]: No] [name]: Yes] Edema: [Left: Ye] [Right: s] Calf Left: Right: Point of Measurement: 31 cm From Medial Instep 34 cm Ankle Left: Right: Point of Measurement: 7 cm From Medial Instep 25 cm Vascular Assessment Pulses: Dorsalis Pedis Palpable: [Right:Yes] Electronic Signature(s) Signed: 01/06/2022 5:29:00 PM By: Deon Pilling RN, BSN Entered By: Deon Pilling on 01/06/2022 14:21:06 -------------------------------------------------------------------------------- Multi Wound Chart Details Patient Name: Date of Service: [name]. 01/06/2022 2:30 PM Medical Record Number: [id number] Patient Account Number: [id number] Date of Birth/Sex: Treating RN: Apr 27, 1934 (88 y.o. Burnadette Pop, Lauren Primary Care Gerhardt Gleed: [name] Other Clinician: Referring Margie Brink: Treating Ardelia Wrede/Extender: [name] in Treatment: 14 Vital Signs Height(in): 80 Pulse(bpm): 31 Weight(lbs): 168 Blood Pressure(mmHg): 138/76 Body  Mass Index(BMI): 24.8 Temperature(F): 97.8 Respiratory Rate(breaths/min): 22 Photos: [1:Right, Anterior Lower Leg] [N/A:N/A N/A] Wound Location: [1:Trauma] [N/A:N/A] Wounding Event: [1:Abrasion] [N/A:N/A] Primary Etiology: [1:Diabetic Wound/Ulcer of the Lower] [N/A:N/A] Secondary Etiology: [1:Extremity Cataracts, Anemia, Asthma, Chronic N/A] Comorbid History: [1:Obstructive Pulmonary Disease (COPD), Congestive Heart Failure, Coronary Artery Disease, Hypertension, Myocardial Infarction, Peripheral Arterial Disease, Type II Diabetes 07/02/2021] [N/A:N/A] Date Acquired: [1:14] [N/A:N/A] Weeks of Treatment: [1:Open] [N/A:N/A] Wound Status: [1:No] [N/A:N/A] Wound Recurrence: [1:1.7x0.6x0.2] [N/A:N/A] Measurements L x W x D (cm) [1:0.801] [N/A:N/A] A (cm) : rea [1:0.16] [N/A:N/A] Volume (cm) : [1:77.80%] [N/A:N/A] % Reduction in A [1:rea: 88.90%] [  N/A:N/A] % Reduction in Volume: [1:Full Thickness Without Exposed] [N/A:N/A] Classification: [1:Support Structures Medium] [N/A:N/A] Exudate A mount: [1:Serosanguineous] [N/A:N/A] Exudate Type: [1:red, brown] [N/A:N/A] Exudate Color: [1:Thickened] [N/A:N/A] Wound Margin: [1:Large (67-100%)] [N/A:N/A] Granulation A mount: [1:Red, Pink] [N/A:N/A] Granulation Quality: [1:Small (1-33%)] [N/A:N/A] Necrotic A mount: [1:Fat Layer (Subcutaneous Tissue): Yes N/A] Exposed Structures: [1:Fascia: No Tendon: No Muscle: No Joint: No Bone: No Medium (34-66%)] [N/A:N/A] Epithelialization: [1:Debridement - Excisional] [N/A:N/A] Debridement: Pre-procedure Verification/Time Out 14:20 [N/A:N/A] Taken: [1:Lidocaine 5% topical ointment] [N/A:N/A] Pain Control: [1:Subcutaneous, Slough] [N/A:N/A] Tissue Debrided: [1:Skin/Subcutaneous Tissue] [N/A:N/A] Level: [1:1.02] [N/A:N/A] Debridement A (sq cm): [1:rea Curette] [N/A:N/A] Instrument: [1:Minimum] [N/A:N/A] Bleeding: [1:Pressure] [N/A:N/A] Hemostasis A chieved: [1:0] [N/A:N/A] Procedural Pain: [1:0]  [N/A:N/A] Post Procedural Pain: [1:Procedure was tolerated well] [N/A:N/A] Debridement Treatment Response: [1:1.7x0.6x0.2] [N/A:N/A] Post Debridement Measurements L x W x D (cm) [1:0.16] [N/A:N/A] Post Debridement Volume: (cm) [1:Debridement] [N/A:N/A] Treatment Notes Wound #1 (Lower Leg) Wound Laterality: Right, Anterior Cleanser Soap and Water Discharge Instruction: May shower and wash wound with dial antibacterial soap and water prior to dressing change. Wound Cleanser Discharge Instruction: Cleanse the wound with wound cleanser prior to applying a clean dressing using gauze sponges, not tissue or cotton balls. Peri-Wound Care Skin Prep Discharge Instruction: Use skin prep as directed Sween Lotion (Moisturizing lotion) Discharge Instruction: Apply moisturizing lotion as directed Topical Primary Dressing MediHoney Gel, tube 1.5 (oz) Discharge Instruction: Apply to wound bed as instructed Secondary Dressing Zetuvit Plus Silicone Border Dressing 4x4 (in/in) Discharge Instruction: Apply silicone border over primary dressing as directed. Secured With Compression Wrap Compression Stockings Add-Ons Electronic Signature(s) Signed: 01/06/2022 2:33:58 PM By: Kalman Shan DO Signed: 01/06/2022 4:31:06 PM By: Rhae Hammock RN Entered By: Kalman Shan on 01/06/2022 14:29:57 -------------------------------------------------------------------------------- Multi-Disciplinary Care Plan Details Patient Name: Date of Service: [name]. 01/06/2022 2:30 PM Medical Record Number: [id number] Patient Account Number: [id number] Date of Birth/Sex: Treating RN: February 13, 1934 (88 y.o. [name] Primary Care Laurinda Carreno: [name] Other Clinician: Referring Hazelyn Kallen: Treating Alexsa Flaum/Extender: [name] in Treatment: 14 Active Inactive Tissue Oxygenation Nursing Diagnoses: Potential alteration in peripheral tissue perfusion (select prior to  confirmation of diagnosis) Goals: Non-invasive arterial studies are completed as ordered Date Initiated: 10/14/2021 Target Resolution Date: 01/30/2022 Goal Status: Active Interventions: Assess patient understanding of disease process and management upon diagnosis and as needed Assess peripheral arterial status upon admission and as needed Provide education on tissue oxygenation and ischemia Treatment Activities: Ankle Brachial Index (ABI) : 09/30/2021 Non-invasive vascular studies : 10/14/2021 Notes: Wound/Skin Impairment Nursing Diagnoses: Knowledge deficit related to ulceration/compromised skin integrity Goals: Patient/caregiver will verbalize understanding of skin care regimen Date Initiated: 09/26/2021 Target Resolution Date: 01/30/2022 Goal Status: Active Interventions: Assess patient/caregiver ability to perform ulcer/skin care regimen upon admission and as needed Assess ulceration(s) every visit Provide education on ulcer and skin care Treatment Activities: Skin care regimen initiated : 09/26/2021 Topical wound management initiated : 09/26/2021 Notes: 10/21/21: Wound care regimen continues, using [employer] and [employer]. Electronic Signature(s) Signed: 01/06/2022 5:29:00 PM By: Deon Pilling RN, BSN Entered By: Deon Pilling on 01/06/2022 14:28:43 -------------------------------------------------------------------------------- Pain Assessment Details Patient Name: Date of Service: [name]. 01/06/2022 2:30 PM Medical Record Number: [id number] Patient Account Number: [id number] Date of Birth/Sex: Treating RN: Oct 09, 1933 (88 y.o. [name] Primary Care Zelene Barga: [name] Other Clinician: Referring Milia Warth: Treating Tanda Morrissey/Extender: [name] in Treatment: 14 Active Problems Location of Pain Severity and Description of Pain Patient Has Paino No Site Locations Rate the  pain. Current Pain Level: 0 Pain Management and  Medication Current Pain Management: Medication: No Cold Application: No Rest: No Massage: No Activity: No T.E.N.S.: No Heat Application: No Leg drop or elevation: No Is the Current Pain Management Adequate: Adequate How does your wound impact your activities of daily livingo Sleep: No Bathing: No Appetite: No Relationship With Others: No Bladder Continence: No Emotions: No Bowel Continence: No Work: No Toileting: No Drive: No Dressing: No Hobbies: No Engineer, maintenance) Signed: 01/06/2022 5:29:00 PM By: Deon Pilling RN, BSN Entered By: Deon Pilling on 01/06/2022 14:20:39 -------------------------------------------------------------------------------- Patient/Caregiver Education Details Patient Name: Date of Service: [name] 8/8/2023andnbsp2:30 PM Medical Record Number: [id number] Patient Account Number: [id number] Date of Birth/Gender: Treating RN: Aug 01, 1933 (88 y.o. [name] Primary Care Physician: [name] Other Clinician: Referring Physician: Treating Physician/Extender: [name] in Treatment: 14 Education Assessment Education Provided To: Patient Education Topics Provided Wound/Skin Impairment: Handouts: Skin Care Do's and Dont's Methods: Explain/Verbal Responses: Reinforcements needed Electronic Signature(s) Signed: 01/06/2022 5:29:00 PM By: Deon Pilling RN, BSN Entered By: Deon Pilling on 01/06/2022 14:28:57 -------------------------------------------------------------------------------- Wound Assessment Details Patient Name: Date of Service: [name]. 01/06/2022 2:30 PM Medical Record Number: [id number] Patient Account Number: [id number] Date of Birth/Sex: Treating RN: 1933/06/09 (88 y.o. [name], Meta.Reding Primary Care Kash Davie: [name] Other Clinician: Referring Donesha Wallander: Treating Vallerie Hentz/Extender: [name] in Treatment: 14 Wound Status Wound Number:  1 Primary Abrasion Etiology: Wound Location: Right, Anterior Lower Leg Secondary Diabetic Wound/Ulcer of the Lower Extremity Wounding Event: Trauma Etiology: Date Acquired: 07/02/2021 Wound Open Weeks Of Treatment: 14 Status: Clustered Wound: No Comorbid Cataracts, Anemia, Asthma, Chronic Obstructive Pulmonary History: Disease (COPD), Congestive Heart Failure, Coronary Artery Disease, Hypertension, Myocardial Infarction, Peripheral Arterial Disease, Type II Diabetes Photos Wound Measurements Length: (cm) 1.7 Width: (cm) 0.6 Depth: (cm) 0.2 Area: (cm) 0.801 Volume: (cm) 0.16 % Reduction in Area: 77.8% % Reduction in Volume: 88.9% Epithelialization: Medium (34-66%) Tunneling: No Undermining: No Wound Description Classification: Full Thickness Without Exposed Support Structures Wound Margin: Thickened Exudate Amount: Medium Exudate Type: Serosanguineous Exudate Color: red, brown Foul Odor After Cleansing: No Slough/Fibrino Yes Wound Bed Granulation Amount: Large (67-100%) Exposed Structure Granulation Quality: Red, Pink Fascia Exposed: No Necrotic Amount: Small (1-33%) Fat Layer (Subcutaneous Tissue) Exposed: Yes Necrotic Quality: Adherent Slough Tendon Exposed: No Muscle Exposed: No Joint Exposed: No Bone Exposed: No Treatment Notes Wound #1 (Lower Leg) Wound Laterality: Right, Anterior Cleanser Soap and Water Discharge Instruction: May shower and wash wound with dial antibacterial soap and water prior to dressing change. Wound Cleanser Discharge Instruction: Cleanse the wound with wound cleanser prior to applying a clean dressing using gauze sponges, not tissue or cotton balls. Peri-Wound Care Skin Prep Discharge Instruction: Use skin prep as directed Sween Lotion (Moisturizing lotion) Discharge Instruction: Apply moisturizing lotion as directed Topical Primary Dressing MediHoney Gel, tube 1.5 (oz) Discharge Instruction: Apply to wound bed as  instructed Secondary Dressing Zetuvit Plus Silicone Border Dressing 4x4 (in/in) Discharge Instruction: Apply silicone border over primary dressing as directed. Secured With Compression Wrap Compression Stockings Environmental education officer) Signed: 01/06/2022 5:29:00 PM By: Deon Pilling RN, BSN Entered By: Deon Pilling on 01/06/2022 14:21:41 -------------------------------------------------------------------------------- Vitals Details Patient Name: Date of Service: [name]. 01/06/2022 2:30 PM Medical Record Number: [id number] Patient Account Number: [id number] Date of Birth/Sex: Treating RN: Jan 04, 1934 (88 y.o. [name] Primary Care Joevon Holliman: [name] Other Clinician: Referring Jaysten Essner: Treating Oceana Walthall/Extender: [name] in Treatment:  14 Vital Signs Time Taken: 14:15 Temperature (F): 97.8 Height (in): 69 Pulse (bpm): 60 Weight (lbs): 168 Respiratory Rate (breaths/min): 22 Body Mass Index (BMI): 24.8 Blood Pressure (mmHg): 138/76 Reference Range: 80 - 120 mg / dl Electronic Signature(s) Signed: 01/06/2022 5:29:00 PM By: Deon Pilling RN, BSN Entered By: Deon Pilling on 01/06/2022 14:20:32

## 2022-01-06 NOTE — Progress Notes (Signed)
DALTIN, CRIST ([id number]) Visit Report for 01/06/2022 Chief Complaint Document Details Patient Name: Date of Service: [name], [name] 01/06/2022 2:30 PM Medical Record Number: [id number] Patient Account Number: [id number] Date of Birth/Sex: Treating RN: 1934-05-06 (88 y.o. Burnadette Pop, Lauren Primary Care Provider: [name] Other Clinician: Referring Provider: Treating Provider/Extender: [name] in Treatment: 14 Information Obtained from: Patient Chief Complaint 09/26/2021; patient is here for a review of a complicated wound on the right anterior lower leg which was initially secondary to trauma Electronic Signature(s) Signed: 01/06/2022 2:33:58 PM By: Kalman Shan DO Entered By: Kalman Shan on 01/06/2022 14:30:05 -------------------------------------------------------------------------------- Debridement Details Patient Name: Date of Service: [name]. 01/06/2022 2:30 PM Medical Record Number: [id number] Patient Account Number: [id number] Date of Birth/Sex: Treating RN: August 04, 1933 (88 y.o. [name], Meta.Reding Primary Care Provider: [name] Other Clinician: Referring Provider: Treating Provider/Extender: [name] in Treatment: 14 Debridement Performed for Assessment: Wound #1 Right,Anterior Lower Leg Performed By: Physician Kalman Shan, DO Debridement Type: Debridement Severity of Tissue Pre Debridement: Fat layer exposed Level of Consciousness (Pre-procedure): Awake and Alert Pre-procedure Verification/Time Out Yes - 14:20 Taken: Start Time: 14:21 Pain Control: Lidocaine 5% topical ointment T Area Debrided (L x W): otal 1.7 (cm) x 0.6 (cm) = 1.02 (cm) Tissue and other material debrided: Viable, Non-Viable, Slough, Subcutaneous, Skin: Dermis , Skin: Epidermis, Slough Level: Skin/Subcutaneous Tissue Debridement Description: Excisional Instrument: Curette Bleeding: Minimum Hemostasis  Achieved: Pressure End Time: 14:27 Procedural Pain: 0 Post Procedural Pain: 0 Response to Treatment: Procedure was tolerated well Level of Consciousness (Post- Awake and Alert procedure): Post Debridement Measurements of Total Wound Length: (cm) 1.7 Width: (cm) 0.6 Depth: (cm) 0.2 Volume: (cm) 0.16 Character of Wound/Ulcer Post Debridement: Improved Severity of Tissue Post Debridement: Fat layer exposed Post Procedure Diagnosis Same as Pre-procedure Electronic Signature(s) Signed: 01/06/2022 2:33:58 PM By: Kalman Shan DO Signed: 01/06/2022 5:29:00 PM By: Deon Pilling RN, BSN Entered By: Deon Pilling on 01/06/2022 14:28:02 -------------------------------------------------------------------------------- HPI Details Patient Name: Date of Service: [name]. 01/06/2022 2:30 PM Medical Record Number: [id number] Patient Account Number: [id number] Date of Birth/Sex: Treating RN: [birth year] (88 y.o. [name] Primary Care Provider: [name] Other Clinician: Referring Provider: Treating Provider/Extender: [name] in Treatment: 14 History of Present Illness HPI Description: ADMISSION 09/26/21 This is a 88 year old man who lives near [place]. He is accompanied by his daughter who lives with him. His wife is also at home. Apparently 2 months ago he traumatized the right anterior lower leg x2 in quick succession including once on the back of a pickup truck or car. By description it sounds as though he had a black eschar on this which fell off about a month ago. He has been left with an open wound. It this is not that large but is completely slough covered and has undermining of at least a centimeter and half from 12-6 o'clock. He received a course of antibiotics earlier this month from his primary care doctor Ceftin. He is not on antibiotics currently. The patient has a complicated relevant history. 2019 he underwent bilateral common iliac  artery stenting and angioplasties by Dr. Carlis Abbott. He had ABIs at the same time which showed a ABI at 0.55 on the right and a TBI of 0.38. He had a TBI of 0.43 on the left they could not do an ABI because he could not tolerate the pressure cuff. He has not followed up. The patient is apparently a  diabetic although he has not been on any treatment in years and its not clear that he is even followed up. Other past medical history includes coronary artery disease, ischemic cardiomyopathy status post pacemaker, he is on Plavix, He did not cooperate with our attempt to do ABIs on the right leg in our clinic 5/5; patient presents for follow-up. Last week he was started on Iodosorb under Kerlix/Coban. He has no issues or complaints today. He has his venous and arterial studies scheduled for next week. He currently denies signs of infection. 5/16; patient presents for follow-up. Last week he was started on Hydrofera Blue and gentamicin and mupirocin ointment under Kerlix/Coban. He tolerated this well. He had a PCR culture that reported high levels of Streptococcus agalactiae, Pseudomonas aeruginosa and Enterococcus bacillus. Keystone antibiotics were ordered and he has this however did not bring it today. He had ABIs with TBI's and venous reflux studies. He had evidence of reflux throughout the right and left venous system. His ABIs on the right was noncompressible with a TBI of 0.19. He currently denies systemic signs of infection. 5/23; patient presents for follow-up. He has been using Keystone antibiotic to the wound bed without issues. He currently denies signs of infection. He has not heard back from Dr. Kennon Holter office. 6/8; patient is back his wound is somewhat better with less undermining but still the same basic surface area. The wound surface looks better in terms of nonviable material. But the other issue that concerns me is this patient probably has some minimal distance claudication and may even have  claudication at rest at night in the right leg. Referencing my notes from his admission he has had previous bilateral common iliac artery stenting and angioplasties in 2019. For some reason I do not know that he ever had any follow-up. 6/19; patient presents for follow-up. He saw Dr. Luan Pulling on 6/16 that recommended a CT angiogram to evaluate iliac stents and right common femoral disease. Patient is using Keystone antibiotic with [facility] without issues. He denies signs of infection. 6/26; patient presents for follow-up. He had a CT angiogram that showed a patent common iliac artery. There is advanced right femoral popliteal disease including SFA stenosis with plaque extending into the SFA and profunda femoris. 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 He has an appointment scheduled tomorrow with Dr. Roselie Awkward to review these results. 7/10; patient presents for follow-up. He is scheduled for right femoral endarterectomy on 7/13 with Dr. Roselie Awkward. He has developed a smaller wound more proximal to the original wound. He is not sure how this started. He has been using Keystone antibiotic and Hydrofera Blue to the wound bed. He denies signs of infection. 7/18; patient presents for follow-up. On 7/13 he had a right lower extremity angiogram with right common femoral and profunda femoris artery angioplasty and stenting. The previous plan for femoral endarterectomy was canceled. He has been using Keystone and [facility] to the wound bed. He currently denies signs of infection. 7/25; patient presents for follow-up. He has been using Keystone and [facility] to the wound bed. He reports improvement in his chronic pain and wound healing. 7/31; patient presents for follow-up. He has been using Keystone and [facility] to the wound bed. He has no issues or complaints today. 8/8; patient presents for follow-up. He has been using Keystone and  [facility] to the wound bed. The blue dressing was stuck to the wound bed today. Electronic Signature(s)  Signed: 01/06/2022 2:33:58 PM By: Kalman Shan DO Entered By: Kalman Shan on 01/06/2022 14:30:53 -------------------------------------------------------------------------------- Physical Exam Details Patient Name: Date of Service: [name]. 01/06/2022 2:30 PM Medical Record Number: [id number] Patient Account Number: [id number] Date of Birth/Sex: Treating RN: [birth year] (88 y.o. [name] Primary Care Provider: [name] Other Clinician: Referring Provider: Treating Provider/Extender: [name] in Treatment: 14 Constitutional respirations regular, non-labored and within target range for patient.. Cardiovascular 2+ dorsalis pedis/posterior tibialis pulses. Psychiatric pleasant and cooperative. Notes Right lower extremity: T the mid tibial aspect there is an open wound with granulation tissue and nonviable tissue. No signs of surrounding infection. o Electronic Signature(s) Signed: 01/06/2022 2:33:58 PM By: Kalman Shan DO Entered By: Kalman Shan on 01/06/2022 14:32:10 -------------------------------------------------------------------------------- Physician Orders Details Patient Name: Date of Service: [name] 01/06/2022 2:30 PM Medical Record Number: [id number] Patient Account Number: [id number] Date of Birth/Sex: Treating RN: 1934-04-03 (88 y.o. [name], [name] Primary Care Provider: [name] Other Clinician: Referring Provider: Treating Provider/Extender: [name] in Treatment: 14 Verbal / Phone Orders: No Diagnosis Coding ICD-10 Coding Code Description S80.11XD Contusion of right lower leg, subsequent encounter L97.818 Non-pressure chronic ulcer of other part of right lower leg with other specified severity I87.311 Chronic venous hypertension (idiopathic) with  ulcer of right lower extremity I70.238 Atherosclerosis of native arteries of right leg with ulceration of other part of lower leg Follow-up Appointments ppointment in 2 weeks. - Tuesday 215pm 01/19/2022 Dr. Heber [place] and [place] Room 8 Return A Other: - Ensure to follow up with Dr. Mora Appl [facility] on 01/09/2022 and 01/13/2022 Bathing/ Shower/ Hygiene May shower with protection but do not get wound dressing(s) wet. Edema Control - Lymphedema / SCD / Other Elevate legs to the level of the heart or above for 30 minutes daily and/or when sitting, a frequency of: - 3-4 times a day throughout the day. Avoid standing for long periods of time. Exercise regularly Wound Treatment Wound #1 - Lower Leg Wound Laterality: Right, Anterior Cleanser: Soap and Water 1 x Per Day/30 Days Discharge Instructions: May shower and wash wound with dial antibacterial soap and water prior to dressing change. Cleanser: Wound Cleanser (Generic) 1 x Per Day/30 Days Discharge Instructions: Cleanse the wound with wound cleanser prior to applying a clean dressing using gauze sponges, not tissue or cotton balls. Peri-Wound Care: Skin Prep (Generic) 1 x Per Day/30 Days Discharge Instructions: Use skin prep as directed Peri-Wound Care: Sween Lotion (Moisturizing lotion) 1 x Per Day/30 Days Discharge Instructions: Apply moisturizing lotion as directed Prim Dressing: MediHoney Gel, tube 1.5 (oz) 1 x Per Day/30 Days ary Discharge Instructions: Apply to wound bed as instructed Secondary Dressing: Zetuvit Plus Silicone Border Dressing 4x4 (in/in) (Generic) 1 x Per Day/30 Days Discharge Instructions: Apply silicone border over primary dressing as directed. Electronic Signature(s) Signed: 01/06/2022 2:33:58 PM By: Kalman Shan DO Entered By: Kalman Shan on 01/06/2022 14:32:16 -------------------------------------------------------------------------------- Problem List Details Patient Name: Date of Service: [name].  01/06/2022 2:30 PM Medical Record Number: [id number] Patient Account Number: [id number] Date of Birth/Sex: Treating RN: [birth year] (88 y.o. [name] Primary Care Provider: [name] Other Clinician: Referring Provider: Treating Provider/Extender: [name] in Treatment: 14 Active Problems ICD-10 Encounter Code Description Active Date MDM Diagnosis S80.11XD Contusion of right lower leg, subsequent encounter 09/26/2021 No Yes L97.818 Non-pressure chronic ulcer of other part of right lower leg with other specified 09/26/2021 No Yes severity I87.311 Chronic venous hypertension (  idiopathic) with ulcer of right lower extremity 09/26/2021 No Yes I70.238 Atherosclerosis of native arteries of right leg with ulceration of other part of 10/14/2021 No Yes lower leg Inactive Problems Resolved Problems Electronic Signature(s) Signed: 01/06/2022 2:33:58 PM By: Kalman Shan DO Entered By: Kalman Shan on 01/06/2022 14:29:27 -------------------------------------------------------------------------------- Progress Note Details Patient Name: Date of Service: [name]. 01/06/2022 2:30 PM Medical Record Number: [id number] Patient Account Number: [id number] Date of Birth/Sex: Treating RN: 1933/09/17 (88 y.o. Burnadette Pop, Lauren Primary Care Provider: [name] Other Clinician: Referring Provider: Treating Provider/Extender: [name] in Treatment: 14 Subjective Chief Complaint Information obtained from Patient 09/26/2021; patient is here for a review of a complicated wound on the right anterior lower leg which was initially secondary to trauma History of Present Illness (HPI) ADMISSION 09/26/21 This is a 88 year old man who lives near [place]. He is accompanied by his daughter who lives with him. His wife is also at home. Apparently 2 months ago he traumatized the right anterior lower leg x2 in quick succession including  once on the back of a pickup truck or car. By description it sounds as though he had a black eschar on this which fell off about a month ago. He has been left with an open wound. It this is not that large but is completely slough covered and has undermining of at least a centimeter and half from 12-6 o'clock. He received a course of antibiotics earlier this month from his primary care doctor Ceftin. He is not on antibiotics currently. The patient has a complicated relevant history. 2019 he underwent bilateral common iliac artery stenting and angioplasties by Dr. Carlis Abbott. He had ABIs at the same time which showed a ABI at 0.55 on the right and a TBI of 0.38. He had a TBI of 0.43 on the left they could not do an ABI because he could not tolerate the pressure cuff. He has not followed up. The patient is apparently a diabetic although he has not been on any treatment in years and its not clear that he is even followed up. Other past medical history includes coronary artery disease, ischemic cardiomyopathy status post pacemaker, he is on Plavix, He did not cooperate with our attempt to do ABIs on the right leg in our clinic 5/5; patient presents for follow-up. Last week he was started on Iodosorb under Kerlix/Coban. He has no issues or complaints today. He has his venous and arterial studies scheduled for next week. He currently denies signs of infection. 5/16; patient presents for follow-up. Last week he was started on Hydrofera Blue and gentamicin and mupirocin ointment under Kerlix/Coban. He tolerated this well. He had a PCR culture that reported high levels of Streptococcus agalactiae, Pseudomonas aeruginosa and Enterococcus bacillus. Keystone antibiotics were ordered and he has this however did not bring it today. He had ABIs with TBI's and venous reflux studies. He had evidence of reflux throughout the right and left venous system. His ABIs on the right was noncompressible with a TBI of 0.19. He  currently denies systemic signs of infection. 5/23; patient presents for follow-up. He has been using Keystone antibiotic to the wound bed without issues. He currently denies signs of infection. He has not heard back from Dr. Kennon Holter office. 6/8; patient is back his wound is somewhat better with less undermining but still the same basic surface area. The wound surface looks better in terms of nonviable material. But the other issue that concerns me is this patient probably  has some minimal distance claudication and may even have claudication at rest at night in the right leg. Referencing my notes from his admission he has had previous bilateral common iliac artery stenting and angioplasties in 2019. For some reason I do not know that he ever had any follow-up. 6/19; patient presents for follow-up. He saw Dr. Luan Pulling on 6/16 that recommended a CT angiogram to evaluate iliac stents and right common femoral disease. Patient is using Keystone antibiotic with [facility] without issues. He denies signs of infection. 6/26; patient presents for follow-up. He had a CT angiogram that showed a patent common iliac artery. There is advanced right femoral popliteal disease including SFA stenosis with plaque extending into the SFA and profunda femoris. 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 He has an appointment scheduled tomorrow with Dr. Roselie Awkward to review these results. 7/10; patient presents for follow-up. He is scheduled for right femoral endarterectomy on 7/13 with Dr. Roselie Awkward. He has developed a smaller wound more proximal to the original wound. He is not sure how this started. He has been using Keystone antibiotic and Hydrofera Blue to the wound bed. He denies signs of infection. 7/18; patient presents for follow-up. On 7/13 he had a right lower extremity angiogram with right common femoral and profunda femoris artery angioplasty and stenting.  The previous plan for femoral endarterectomy was canceled. He has been using Keystone and [facility] to the wound bed. He currently denies signs of infection. 7/25; patient presents for follow-up. He has been using Keystone and [facility] to the wound bed. He reports improvement in his chronic pain and wound healing. 7/31; patient presents for follow-up. He has been using Keystone and [facility] to the wound bed. He has no issues or complaints today. 8/8; patient presents for follow-up. He has been using Keystone and [facility] to the wound bed. The blue dressing was stuck to the wound bed today. Patient History Information obtained from Patient, Chart. Family History Cancer - Siblings, Kidney Disease - Father. Social History Former smoker - quit 1985, Marital Status - Married, Alcohol Use - Never, Drug Use - No History, Caffeine Use - Daily. Medical History Eyes Patient has history of Cataracts - Extraction 2018 Hematologic/Lymphatic Patient has history of Anemia Respiratory Patient has history of Asthma, Chronic Obstructive Pulmonary Disease (COPD) Cardiovascular Patient has history of Congestive Heart Failure, Coronary Artery Disease, Hypertension, Myocardial Infarction - 2009, Peripheral Arterial Disease Endocrine Patient has history of Type II Diabetes - Diet Controlled-does not take any medications Hospitalization/Surgery History - angioplasty stent- 2019 dr.Clark. - carotid stents. Medical A Surgical History Notes nd Eyes thyroid eye disease, hypertrophia of right eye, hypotropia of left eye, diplopia- corrected per daughter Cardiovascular Pacemaker/defibrillator Endocrine Thyroiditis, Hypothyroidism Objective Constitutional respirations regular, non-labored and within target range for patient.. Vitals Time Taken: 2:15 PM, Height: 69 in, Weight: 168 lbs, BMI: 24.8, Temperature: 97.8 F, Pulse: 60 bpm, Respiratory Rate: 22 breaths/min, Blood Pressure: 138/76  mmHg. Cardiovascular 2+ dorsalis pedis/posterior tibialis pulses. Psychiatric pleasant and cooperative. General Notes: Right lower extremity: T the mid tibial aspect there is an open wound with granulation tissue and nonviable tissue. No signs of surrounding o infection. Integumentary (Hair, Skin) Wound #1 status is Open. Original cause of wound was Trauma. The date acquired was: 07/02/2021. The wound has been in treatment 14 weeks. The wound is located on the Right,Anterior Lower Leg. The wound measures 1.7cm length x 0.6cm width x 0.2cm depth; 0.801cm^2  area and 0.16cm^3 volume. There is Fat Layer (Subcutaneous Tissue) exposed. There is no tunneling or undermining noted. There is a medium amount of serosanguineous drainage noted. The wound margin is thickened. There is large (67-100%) red, pink granulation within the wound bed. There is a small (1-33%) amount of necrotic tissue within the wound bed including Adherent Slough. Assessment Active Problems ICD-10 Contusion of right lower leg, subsequent encounter Non-pressure chronic ulcer of other part of right lower leg with other specified severity Chronic venous hypertension (idiopathic) with ulcer of right lower extremity Atherosclerosis of native arteries of right leg with ulceration of other part of lower leg Patient's wound has shown improvement in size and appearance since last clinic visit. I debrided nonviable tissue. I recommended switching the dressing from Hydrofera Blue to Medihoney. He can stop Keystone at this time. Follow-up in 2 weeks. Procedures Wound #1 Pre-procedure diagnosis of Wound #1 is an Abrasion located on the Right,Anterior Lower Leg .Severity of Tissue Pre Debridement is: Fat layer exposed. There was a Excisional Skin/Subcutaneous Tissue Debridement with a total area of 1.02 sq cm performed by Kalman Shan, DO. With the following instrument(s): Curette to remove Viable and Non-Viable tissue/material. Material  removed includes Subcutaneous Tissue, Slough, Skin: Dermis, and Skin: Epidermis after achieving pain control using Lidocaine 5% topical ointment. A time out was conducted at 14:20, prior to the start of the procedure. A Minimum amount of bleeding was controlled with Pressure. The procedure was tolerated well with a pain level of 0 throughout and a pain level of 0 following the procedure. Post Debridement Measurements: 1.7cm length x 0.6cm width x 0.2cm depth; 0.16cm^3 volume. Character of Wound/Ulcer Post Debridement is improved. Severity of Tissue Post Debridement is: Fat layer exposed. Post procedure Diagnosis Wound #1: Same as Pre-Procedure Plan Follow-up Appointments: Return Appointment in 2 weeks. - Tuesday 215pm 01/19/2022 Dr. Heber [place] and [place] Room 8 Other: - Ensure to follow up with Dr. Mora Appl [facility] on 01/09/2022 and 01/13/2022 Bathing/ Shower/ Hygiene: May shower with protection but do not get wound dressing(s) wet. Edema Control - Lymphedema / SCD / Other: Elevate legs to the level of the heart or above for 30 minutes daily and/or when sitting, a frequency of: - 3-4 times a day throughout the day. Avoid standing for long periods of time. Exercise regularly WOUND #1: - Lower Leg Wound Laterality: Right, Anterior Cleanser: Soap and Water 1 x Per Day/30 Days Discharge Instructions: May shower and wash wound with dial antibacterial soap and water prior to dressing change. Cleanser: Wound Cleanser (Generic) 1 x Per Day/30 Days Discharge Instructions: Cleanse the wound with wound cleanser prior to applying a clean dressing using gauze sponges, not tissue or cotton balls. Peri-Wound Care: Skin Prep (Generic) 1 x Per Day/30 Days Discharge Instructions: Use skin prep as directed Peri-Wound Care: Sween Lotion (Moisturizing lotion) 1 x Per Day/30 Days Discharge Instructions: Apply moisturizing lotion as directed Prim Dressing: MediHoney Gel, tube 1.5 (oz) 1 x Per Day/30 Days ary Discharge  Instructions: Apply to wound bed as instructed Secondary Dressing: Zetuvit Plus Silicone Border Dressing 4x4 (in/in) (Generic) 1 x Per Day/30 Days Discharge Instructions: Apply silicone border over primary dressing as directed. 1. In office sharp debridement 2. Medihoney 3. Follow-up in 2 weeks Electronic Signature(s) Signed: 01/06/2022 2:33:58 PM By: Kalman Shan DO Entered By: Kalman Shan on 01/06/2022 14:33:24 -------------------------------------------------------------------------------- HxROS Details Patient Name: Date of Service: [name]. 01/06/2022 2:30 PM Medical Record Number: [id number] Patient Account Number: [id number] Date of Birth/Sex: Treating  RN: 04/19/1934 (88 y.o. Burnadette Pop, Lauren Primary Care Provider: [name] Other Clinician: Referring Provider: Treating Provider/Extender: [name] in Treatment: 14 Information Obtained From Patient Chart Eyes Medical History: Positive for: Cataracts - Extraction 2018 Past Medical History Notes: thyroid eye disease, hypertrophia of right eye, hypotropia of left eye, diplopia- corrected per daughter Hematologic/Lymphatic Medical History: Positive for: Anemia Respiratory Medical History: Positive for: Asthma; Chronic Obstructive Pulmonary Disease (COPD) Cardiovascular Medical History: Positive for: Congestive Heart Failure; Coronary Artery Disease; Hypertension; Myocardial Infarction - 2009; Peripheral Arterial Disease Past Medical History Notes: Pacemaker/defibrillator Endocrine Medical History: Positive for: Type II Diabetes - Diet Controlled-does not take any medications Past Medical History Notes: Thyroiditis, Hypothyroidism Time with diabetes: 2019 Treated with: Diet Blood sugar tested every day: No HBO Extended History Items Eyes: Cataracts Immunizations Pneumococcal Vaccine: Received Pneumococcal Vaccination: No Implantable Devices Yes Hospitalization  / Surgery History Type of Hospitalization/Surgery angioplasty stent- 2019 dr.Clark carotid stents Family and Social History Cancer: Yes - Siblings; Kidney Disease: Yes - Father; Former smoker - quit 1985; Marital Status - Married; Alcohol Use: Never; Drug Use: No History; Caffeine Use: Daily; Financial Concerns: No; Food, Clothing or Shelter Needs: No; Support System Lacking: No; Transportation Concerns: No Electronic Signature(s) Signed: 01/06/2022 2:33:58 PM By: Kalman Shan DO Signed: 01/06/2022 4:31:06 PM By: Rhae Hammock RN Entered By: Kalman Shan on 01/06/2022 14:30:58 -------------------------------------------------------------------------------- SuperBill Details Patient Name: Date of Service: [name] 01/06/2022 Medical Record Number: [id number] Patient Account Number: [id number] Date of Birth/Sex: Treating RN: 1934-03-15 (88 y.o. [name], [name] Primary Care Provider: [name] Other Clinician: Referring Provider: Treating Provider/Extender: [name] in Treatment: 14 Diagnosis Coding ICD-10 Codes Code Description S80.11XD Contusion of right lower leg, subsequent encounter L97.818 Non-pressure chronic ulcer of other part of right lower leg with other specified severity I87.311 Chronic venous hypertension (idiopathic) with ulcer of right lower extremity I70.238 Atherosclerosis of native arteries of right leg with ulceration of other part of lower leg Facility Procedures CPT4 Code: 28768115 Description: 72620 - DEB SUBQ TISSUE 20 SQ CM/< ICD-10 Diagnosis Description L97.818 Non-pressure chronic ulcer of other part of right lower leg with other specified Modifier: severity Quantity: 1 Physician Procedures : CPT4 Code Description Modifier [id number] 11042 - WC PHYS SUBQ TISS 20 SQ CM ICD-10 Diagnosis Description L97.818 Non-pressure chronic ulcer of other part of right lower leg with other specified severity Quantity:  1 Electronic Signature(s) Signed: 01/06/2022 2:33:58 PM By: Kalman Shan DO Entered By: Kalman Shan on 01/06/2022 14:33:32

## 2022-01-09 ENCOUNTER — Ambulatory Visit (INDEPENDENT_AMBULATORY_CARE_PROVIDER_SITE_OTHER)
Admission: RE | Admit: 2022-01-09 | Discharge: 2022-01-09 | Disposition: A | Discharge status: Home / Self Care | Payer: Medicare Other | Source: Ambulatory Visit | Attending: Vascular Surgery | Admitting: Vascular Surgery

## 2022-01-09 ENCOUNTER — Ambulatory Visit (HOSPITAL_COMMUNITY)
Admission: RE | Admit: 2022-01-09 | Discharge: 2022-01-09 | Disposition: A | Discharge status: Home / Self Care | Payer: Medicare Other | Source: Ambulatory Visit | Attending: Vascular Surgery | Admitting: Vascular Surgery

## 2022-01-09 DIAGNOSIS — I70239 Atherosclerosis of native arteries of right leg with ulceration of unspecified site: Secondary | ICD-10-CM

## 2022-01-09 DIAGNOSIS — I739 Peripheral vascular disease, unspecified: Secondary | ICD-10-CM

## 2022-01-12 ENCOUNTER — Other Ambulatory Visit: Payer: Self-pay | Admitting: Internal Medicine

## 2022-01-12 NOTE — Progress Notes (Unsigned)
[facility] [place]  ASSESSMENT / PLAN: [name] is a 88 y.o. 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  WIfI score 1 / 3 / 0 . Moderate amputation risk. High potential benefit from revascularization.  Recommend the following which can slow the progression of atherosclerosis and reduce the risk of major adverse cardiac / limb events:  Complete cessation from all tobacco products. Blood glucose control with goal A1c < 7%. Blood pressure control with goal blood pressure < 140/90 mmHg. Lipid reduction therapy with goal LDL-C <100 mg/dL (<70 if symptomatic from PAD).  Aspirin '81mg'$  PO QD.  Atorvastatin 40-'80mg'$  PO QD (or other "high intensity" statin therapy).  Thankfully his ulceration is improving.  His noninvasive testing shows a modest improvement in his toe pressure.  I suggested watchful waiting as any further revascularization efforts would be high risk to this chronically ill gentleman.  We will see him again in 6 months.  CHIEF COMPLAINT: right calf wound  HISTORY OF PRESENT ILLNESS: [name] is a 88 y.o. male who presents to clinic for evaluation of right calf wound.  He is well-known to our practice having undergone bilateral common iliac artery stenting in 2019 for ischemic rest pain.  He has now developed a nonhealing ulcer on his right pretibial skin.  The wound is incredibly painful to him.  He also has persistent swelling in his lower extremities likely due to congestive heart failure.  He has multiple chronic medical comorbidities (see below).  01/13/22: Returns to clinic for postoperative evaluation.  We again reviewed the operative details, including my inability to cross the bulky lesion in his left superficial femoral artery.  Thankfully, he is doing quite well.  The ulcer about his pretibial skin appears to be healing.  He continues to see the wound care center.  He has no  complaints.  Past Medical History:  Diagnosis Date   Atrial flutter ([place])    (I could not find documentation of this rhythm.)   AUTOMATIC IMPLANTABLE CARDIAC DEFIBRILLATOR SITU    Automatic implantable cardioverter-defibrillator in situ 12/05/2009   Qualifier: Diagnosis of  By: [name], MD, [facility], Binnie Kand    CAD (coronary artery disease) 05/22/2011   Carotid stenosis 05/22/2011   CHF CONGESTIVE HEART FAILURE    45% by echo 2015   COPD 12/01/2007   Qualifier: Diagnosis of  By: [name] NP, [name]     Dementia ([place])    per pt's stepdaughter   DM2 (diabetes mellitus, type 2) ([place])    pt's stepdaughter said he is no longer Diabetic, and does not take medication for it   DYSLIPIDEMIA    DYSPNEA    Edema    Essential hypertension 11/17/2007   Qualifier: Diagnosis of  By: [name]     GERD (gastroesophageal reflux disease)    Gout    HYPERTENSION    HYPOTHYROIDISM    MYOCARDIAL INFARCTION    MI 1999, stent x 2 to RCA, 70% circ, 30 and 40 % LADs   S/P updgrade to CRT-D 11/23/2020   WEIGHT GAIN, ABNORMAL     Past Surgical History:  Procedure Laterality Date   ANGIOPLASTY     stent   APPENDECTOMY  1957   BIV UPGRADE N/A 11/22/2020   Procedure: BIV ICD UPGRADE;  Surgeon: Evans Lance, MD;  Location: [place] [facility];  Service: Cardiovascular;  Laterality: N/A;   CARDIAC DEFIBRILLATOR PLACEMENT     CAROTID STENT  EYE SURGERY  1985   INSERTION OF ILIAC STENT N/A 12/11/2021   Procedure: LOWER EXTREMITY ANGIOGRAM WITH COMMON FEMORAL ARTERY AND PROFUNDA  ARTERY STENTING;  Surgeon: Cherre Robins, MD;  Location: [facility];  Service: Vascular;  Laterality: N/A;   LOWER EXTREMITY ANGIOGRAPHY N/A 05/04/2018   Procedure: LOWER EXTREMITY ANGIOGRAPHY;  Surgeon: [name], MD;  Location: [facility];  Service: Cardiovascular;  Laterality: N/A;   PERIPHERAL VASCULAR INTERVENTION Bilateral 05/04/2018   Procedure: PERIPHERAL VASCULAR INTERVENTION;  Surgeon: [name], MD;  Location: [facility];  Service: Cardiovascular;  Laterality: Bilateral;  [place]   ULTRASOUND GUIDANCE FOR VASCULAR ACCESS Left 12/11/2021   Procedure: ULTRASOUND GUIDANCE FOR VASCULAR ACCESS;  Surgeon: Cherre Robins, MD;  [facility];  Service: Vascular;  Laterality: Left;    Family History  Problem Relation Age of Onset   Other Mother        natural causes   Kidney disease Father    Cancer Brother        throat cancer    Social History   Socioeconomic History   Marital status: Married    Spouse name: Ila   Number of children: 2   Years of education: Not on file   Highest education level: Not on file  Occupational History   Not on file  Tobacco Use   Smoking status: Former    Types: Cigarettes    Quit date: 12/10/1983    Years since quitting: 38.1   Smokeless tobacco: Never   Tobacco comments:    quit 1985  Vaping Use   Vaping Use: Never used  Substance and Sexual Activity   Alcohol use: No   Drug use: No   Sexual activity: Not on file  Other Topics Concern   Not on file  Social History Narrative   1 stepdaughter, Mardene Celeste. 1 son who is not around and not in contact with his father. Pt's wife has alzheimers   Social Determinants of Radio broadcast assistant Strain: Not on file  Food Insecurity: Not on file  Transportation Needs: Not on file  Physical Activity: Not on file  Stress: Not on file  Social Connections: Not on file  Intimate Partner Violence: Not on file    Allergies  Allergen Reactions   Prednisone Rash   Sulfa Antibiotics Other (See Comments)    Hyper     Current Outpatient Medications  Medication Sig Dispense Refill   acetaminophen (TYLENOL) 650 MG CR tablet Take 650 mg by mouth every 8 (eight) hours as needed for pain.     aspirin EC 81 MG tablet Take 81 mg by mouth in the morning. Swallow whole.     atorvastatin (LIPITOR) 40 MG tablet TAKE 1 TABLET (40 MG TOTAL) BY MOUTH IN THE MORNING. 90 tablet 3    clopidogrel (PLAVIX) 75 MG tablet TAKE 1 TABLET (75 MG TOTAL) BY MOUTH IN THE MORNING. 90 tablet 1   famotidine (PEPCID) 40 MG tablet Take 40 mg by mouth 2 (two) times daily.     FLOVENT HFA 110 MCG/ACT inhaler Inhale 2 puffs into the lungs 2 (two) times daily.  3   furosemide (LASIX) 20 MG tablet Take 1 tablet (20 mg total) by mouth in the morning. 90 tablet 3   hydrALAZINE (APRESOLINE) 10 MG tablet Take 1 tablet (10 mg total) by mouth in the morning and at bedtime. 180 tablet 3   metoprolol (TOPROL-XL) 200 MG 24 hr tablet  Take 0.5 tablets (100 mg total) by mouth in the morning and at bedtime. 90 tablet 3   mirtazapine (REMERON) 30 MG tablet TAKE 1 TABLET BY MOUTH EVERY DAY 90 tablet 1   montelukast (SINGULAIR) 10 MG tablet Take 1 tablet (10 mg total) by mouth daily. 90 tablet 1   pantoprazole (PROTONIX) 40 MG tablet Take 40 mg by mouth 2 (two) times daily. Morning & Evening     Polyethyl Glycol-Propyl Glycol (SYSTANE OP) Place 1 drop into both eyes in the morning and at bedtime.     SYNTHROID 200 MCG tablet TAKE 1 TABLET (200 MCG TOTAL) BY MOUTH IN THE MORNING. 90 tablet 0   isosorbide mononitrate (IMDUR) 30 MG 24 hr tablet Take 1 tablet (30 mg total) by mouth daily. 90 tablet 3   No current facility-administered medications for this visit.    PHYSICAL EXAM Vitals:   01/13/22 1530  BP: (!) 146/52  Pulse: (!) 57  Resp: 20  Temp: 98.7 F (37.1 C)  SpO2: 98%  Weight: 68.9 kg  Height: '5\' 9"'$  (1.753 m)     Constitutional: Chronically ill-appearing elderly gentleman in no acute distress Cardiac: Regular rate and rhythm.  Respiratory:  unlabored. Abdominal:  soft, non-tender, non-distended.  Peripheral vascular: No palpable pedal pulses.  Improved pretibial ulceration of the right lower extremity   PERTINENT LABORATORY AND RADIOLOGIC DATA  Most recent CBC    Latest Ref Rng & Units 11/28/2021   12:23 PM 04/09/2021    3:54 PM 02/06/2021    1:05 PM  CBC  WBC 4.0 - 10.5 K/uL 9.2  8.5   6.3   Hemoglobin 13.0 - 17.0 g/dL 9.2  10.3  10.1   Hematocrit 39.0 - 52.0 % 30.0  31.5  31.3   Platelets 150 - 400 K/uL 247  211.0  214      Most recent CMP    Latest Ref Rng & Units 11/28/2021   12:23 PM 11/10/2021    3:40 PM 09/08/2021    3:38 PM  CMP  Glucose 70 - 99 mg/dL 107  104  93   BUN 8 - 23 mg/dL 27  27  35   Creatinine 0.61 - 1.24 mg/dL 1.71  1.45  1.43   Sodium 135 - 145 mmol/L 140  140  141   Potassium 3.5 - 5.1 mmol/L 4.6  4.8  5.2   Chloride 98 - 111 mmol/L 108  105  108   CO2 22 - 32 mmol/L '25  23  21   '$ Calcium 8.9 - 10.3 mg/dL 9.1  9.2  9.3   Total Protein 6.5 - 8.1 g/dL 7.5     Total Bilirubin 0.3 - 1.2 mg/dL 0.9     Alkaline Phos 38 - 126 U/L 43     AST 15 - 41 U/L 15     ALT 0 - 44 U/L 8       Renal function CrCl cannot be calculated (Patient's most recent lab result is older than the maximum 21 days allowed.).  No results found for: "HGBA1C"  LDL Chol Calc (NIH)  Date Value Ref Range Status  04/30/2021 56 0 - 99 mg/dL Final     Vascular Imaging: +-------+-----------+-----------+----------------------------+------------+   ABI/TBIToday's ABIToday's TBIPrevious ABI                Previous  TBI  +-------+-----------+-----------+----------------------------+------------+   Right           0.19       0.55  0.38            +-------+-----------+-----------+----------------------------+------------+   Left   [place]         0.42       unable to obtain due to pain0.43            +-------+-----------+-----------+----------------------------+------------+   Yevonne Aline. Stanford Breed, MD [facility] Phone Number: [phone] 01/13/2022 4:55 PM  Total time spent on preparing this encounter including chart review, data review, collecting history, examining the patient, coordinating care for this established patient, 20 minutes  Portions of this report may have been transcribed using  voice recognition software.  Every effort has been made to ensure accuracy; however, inadvertent computerized transcription errors may still be present.

## 2022-01-13 ENCOUNTER — Other Ambulatory Visit: Payer: Self-pay | Admitting: Internal Medicine

## 2022-01-13 ENCOUNTER — Ambulatory Visit (INDEPENDENT_AMBULATORY_CARE_PROVIDER_SITE_OTHER): Payer: Medicare Other | Admitting: Vascular Surgery

## 2022-01-13 ENCOUNTER — Encounter: Payer: Self-pay | Admitting: Vascular Surgery

## 2022-01-13 VITALS — BP 146/52 | HR 57 | Temp 98.7°F | Resp 20 | Ht 69.0 in | Wt 152.0 lb

## 2022-01-13 DIAGNOSIS — I739 Peripheral vascular disease, unspecified: Secondary | ICD-10-CM | POA: Diagnosis not present

## 2022-01-13 DIAGNOSIS — E038 Other specified hypothyroidism: Secondary | ICD-10-CM

## 2022-01-15 DIAGNOSIS — L821 Other seborrheic keratosis: Secondary | ICD-10-CM | POA: Diagnosis not present

## 2022-01-15 DIAGNOSIS — C44222 Squamous cell carcinoma of skin of right ear and external auricular canal: Secondary | ICD-10-CM | POA: Diagnosis not present

## 2022-01-15 DIAGNOSIS — L578 Other skin changes due to chronic exposure to nonionizing radiation: Secondary | ICD-10-CM | POA: Diagnosis not present

## 2022-01-15 DIAGNOSIS — L570 Actinic keratosis: Secondary | ICD-10-CM | POA: Diagnosis not present

## 2022-01-16 ENCOUNTER — Other Ambulatory Visit: Payer: Self-pay

## 2022-01-16 ENCOUNTER — Other Ambulatory Visit: Payer: Self-pay | Admitting: Internal Medicine

## 2022-01-16 DIAGNOSIS — I739 Peripheral vascular disease, unspecified: Secondary | ICD-10-CM

## 2022-01-16 DIAGNOSIS — J4530 Mild persistent asthma, uncomplicated: Secondary | ICD-10-CM

## 2022-01-19 ENCOUNTER — Encounter (HOSPITAL_BASED_OUTPATIENT_CLINIC_OR_DEPARTMENT_OTHER): Payer: Medicare Other | Admitting: Internal Medicine

## 2022-01-19 DIAGNOSIS — I87311 Chronic venous hypertension (idiopathic) with ulcer of right lower extremity: Secondary | ICD-10-CM | POA: Diagnosis not present

## 2022-01-19 DIAGNOSIS — L97818 Non-pressure chronic ulcer of other part of right lower leg with other specified severity: Secondary | ICD-10-CM

## 2022-01-19 DIAGNOSIS — I70238 Atherosclerosis of native arteries of right leg with ulceration of other part of lower right leg: Secondary | ICD-10-CM | POA: Diagnosis not present

## 2022-01-19 DIAGNOSIS — I255 Ischemic cardiomyopathy: Secondary | ICD-10-CM | POA: Diagnosis not present

## 2022-01-19 DIAGNOSIS — S8011XA Contusion of right lower leg, initial encounter: Secondary | ICD-10-CM | POA: Diagnosis not present

## 2022-01-19 DIAGNOSIS — Z950 Presence of cardiac pacemaker: Secondary | ICD-10-CM | POA: Diagnosis not present

## 2022-01-19 DIAGNOSIS — I2510 Atherosclerotic heart disease of native coronary artery without angina pectoris: Secondary | ICD-10-CM | POA: Diagnosis not present

## 2022-01-19 DIAGNOSIS — Z7902 Long term (current) use of antithrombotics/antiplatelets: Secondary | ICD-10-CM | POA: Diagnosis not present

## 2022-01-19 NOTE — Progress Notes (Signed)
[name] ([id number]) Visit Report for 01/19/2022 Chief Complaint Document Details Patient Name: Date of Service: [name], [name] 01/19/2022 2:15 PM Medical Record Number: [id number] Patient Account Number: [id number] Date of Birth/Sex: Treating RN: Jun 24, 1933 (88 y.o. [name] Primary Care Provider: Scarlette Shea Other Clinician: Referring Provider: Treating Provider/Extender: [name] in Treatment: 16 Information Obtained from: Patient Chief Complaint 09/26/2021; patient is here for a review of a complicated wound on the right anterior lower leg which was initially secondary to trauma Electronic Signature(s) Signed: 01/19/2022 5:07:45 PM By: Jonathon Shan DO Entered By: [name] on 01/19/2022 15:35:39 -------------------------------------------------------------------------------- Debridement Details Patient Name: Date of Service: [name]. 01/19/2022 2:15 PM Medical Record Number: [id number] Patient Account Number: [id number] Date of Birth/Sex: Treating RN: Sep 12, 1933 (88 y.o. [name], [name] Primary Care Provider: Scarlette Shea Other Clinician: Referring Provider: Treating Provider/Extender: [name] in Treatment: 16 Debridement Performed for Assessment: Wound #1 Right,Anterior Lower Leg Performed By: Physician Jonathon Shan, DO Debridement Type: Debridement Severity of Tissue Pre Debridement: Fat layer exposed Level of Consciousness (Pre-procedure): Awake and Alert Pre-procedure Verification/Time Out Yes - 15:00 Taken: Start Time: 15:01 Pain Control: Lidocaine 5% topical ointment T Area Debrided (L x W): otal 1 (cm) x 1 (cm) = 1 (cm) Tissue and other material debrided: Viable, Non-Viable, Slough, Subcutaneous, Slough Level: Skin/Subcutaneous Tissue Debridement Description: Excisional Instrument: Curette Bleeding: Minimum Hemostasis Achieved: Pressure End Time:  15:07 Procedural Pain: 0 Post Procedural Pain: 0 Response to Treatment: Procedure was tolerated well Level of Consciousness (Post- Awake and Alert procedure): Post Debridement Measurements of Total Wound Length: (cm) 0.6 Width: (cm) 0.5 Depth: (cm) 0.1 Volume: (cm) 0.024 Character of Wound/Ulcer Post Debridement: Requires Further Debridement Severity of Tissue Post Debridement: Fat layer exposed Post Procedure Diagnosis Same as Pre-procedure Electronic Signature(s) Signed: 01/19/2022 4:49:28 PM By: Jonathon Pilling RN, BSN Signed: 01/19/2022 5:07:45 PM By: Jonathon Shan DO Entered By: [name] on 01/19/2022 15:08:11 -------------------------------------------------------------------------------- HPI Details Patient Name: Date of Service: [name]. 01/19/2022 2:15 PM Medical Record Number: [id number] Patient Account Number: [id number] Date of Birth/Sex: Treating RN: [birth year] (88 y.o. [name] Primary Care Provider: Scarlette Shea Other Clinician: Referring Provider: Treating Provider/Extender: [name] in Treatment: 16 History of Present Illness HPI Description: ADMISSION 09/26/21 This is a 88 year old man who lives near [place]. He is accompanied by his daughter who lives with him. His wife is also at home. Apparently 2 months ago he traumatized the right anterior lower leg x2 in quick succession including once on the back of a pickup truck or car. By description it sounds as though he had a black eschar on this which fell off about a month ago. He has been left with an open wound. It this is not that large but is completely slough covered and has undermining of at least a centimeter and half from 12-6 o'clock. He received a course of antibiotics earlier this month from his primary care doctor Ceftin. He is not on antibiotics currently. The patient has a complicated relevant history. 2019 he underwent bilateral common iliac artery  stenting and angioplasties by [name]. He had ABIs at the same time which showed a ABI at 0.55 on the right and a TBI of 0.38. He had a TBI of 0.43 on the left they could not do an ABI because he could not tolerate the pressure cuff. He has not followed up. The patient is apparently a diabetic although he  has not been on any treatment in years and its not clear that he is even followed up. Other past medical history includes coronary artery disease, ischemic cardiomyopathy status post pacemaker, he is on Plavix, He did not cooperate with our attempt to do ABIs on the right leg in our clinic 5/5; patient presents for follow-up. Last week he was started on Iodosorb under Kerlix/Coban. He has no issues or complaints today. He has his venous and arterial studies scheduled for next week. He currently denies signs of infection. 5/16; patient presents for follow-up. Last week he was started on Hydrofera Blue and gentamicin and mupirocin ointment under Kerlix/Coban. He tolerated this well. He had a PCR culture that reported high levels of Streptococcus agalactiae, Pseudomonas aeruginosa and Enterococcus bacillus. Keystone antibiotics were ordered and he has this however did not bring it today. He had ABIs with TBI's and venous reflux studies. He had evidence of reflux throughout the right and left venous system. His ABIs on the right was noncompressible with a TBI of 0.19. He currently denies systemic signs of infection. 5/23; patient presents for follow-up. He has been using Keystone antibiotic to the wound bed without issues. He currently denies signs of infection. He has not heard back from [name] office. 6/8; patient is back his wound is somewhat better with less undermining but still the same basic surface area. The wound surface looks better in terms of nonviable material. But the other issue that concerns me is this patient probably has some minimal distance claudication and may even have  claudication at rest at night in the right leg. Referencing my notes from his admission he has had previous bilateral common iliac artery stenting and angioplasties in 2019. For some reason I do not know that he ever had any follow-up. 6/19; patient presents for follow-up. He saw Dr. Luan Pulling on 6/16 that recommended a CT angiogram to evaluate iliac stents and right common femoral disease. Patient is using Keystone antibiotic with [facility] without issues. He denies signs of infection. 6/26; patient presents for follow-up. He had a CT angiogram that showed a patent common iliac artery. There is advanced right femoral popliteal disease including SFA stenosis with plaque extending into the SFA and profunda femoris. 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 He has an appointment scheduled tomorrow with Dr. Roselie Awkward to review these results. 7/10; patient presents for follow-up. He is scheduled for right femoral endarterectomy on 7/13 with Dr. Roselie Awkward. He has developed a smaller wound more proximal to the original wound. He is not sure how this started. He has been using Keystone antibiotic and Hydrofera Blue to the wound bed. He denies signs of infection. 7/18; patient presents for follow-up. On 7/13 he had a right lower extremity angiogram with right common femoral and profunda femoris artery angioplasty and stenting. The previous plan for femoral endarterectomy was canceled. He has been using Keystone and [facility] to the wound bed. He currently denies signs of infection. 7/25; patient presents for follow-up. He has been using Keystone and [facility] to the wound bed. He reports improvement in his chronic pain and wound healing. 7/31; patient presents for follow-up. He has been using Keystone and [facility] to the wound bed. He has no issues or complaints today. 8/8; patient presents for follow-up. He has been using Keystone and  [facility] to the wound bed. The blue dressing was stuck to the wound bed today. 8/21; patient presents for follow-up.  Patient has been using Medihoney to the wound bed for the past week. He reports improvement in wound healing. He has no issues or complaints today. Electronic Signature(s) Signed: 01/19/2022 5:07:45 PM By: Jonathon Shan DO Entered By: [name] on 01/19/2022 15:36:07 -------------------------------------------------------------------------------- Physical Exam Details Patient Name: Date of Service: Nelligan, Sahas W. 01/19/2022 2:15 PM Medical Record Number: [id number] Patient Account Number: [id number] Date of Birth/Sex: Treating RN: 12-29-1933 (88 y.o. [name] Primary Care Provider: Scarlette Shea Other Clinician: Referring Provider: Treating Provider/Extender: [name] in Treatment: 16 Constitutional respirations regular, non-labored and within target range for patient.. Cardiovascular 2+ dorsalis pedis/posterior tibialis pulses. Psychiatric pleasant and cooperative. Notes Right lower extremity: T the mid tibial aspect there is an open wound with granulation tissue and nonviable tissue. No signs of surrounding infection. o Electronic Signature(s) Signed: 01/19/2022 5:07:45 PM By: Jonathon Shan DO Entered By: [name] on 01/19/2022 15:36:27 -------------------------------------------------------------------------------- Physician Orders Details Patient Name: Date of Service: Wintle, Mahesh W. 01/19/2022 2:15 PM Medical Record Number: [id number] Patient Account Number: [id number] Date of Birth/Sex: Treating RN: 1934/03/06 (88 y.o. [name], [name] Primary Care Provider: Scarlette Shea Other Clinician: Referring Provider: Treating Provider/Extender: [name] in Treatment: 16 Verbal / Phone Orders: No Diagnosis Coding ICD-10 Coding Code Description S80.11XD Contusion of right  lower leg, subsequent encounter L97.818 Non-pressure chronic ulcer of other part of right lower leg with other specified severity I87.311 Chronic venous hypertension (idiopathic) with ulcer of right lower extremity I70.238 Atherosclerosis of native arteries of right leg with ulceration of other part of lower leg Follow-up Appointments ppointment in 1 week. - Dr. Heber [place] and [place], Room 8 01/26/2022 215pm Return A Bathing/ Shower/ Hygiene May shower with protection but do not get wound dressing(s) wet. Edema Control - Lymphedema / SCD / Other Elevate legs to the level of the heart or above for 30 minutes daily and/or when sitting, a frequency of: - 3-4 times a day throughout the day. Avoid standing for long periods of time. Exercise regularly Wound Treatment Wound #1 - Lower Leg Wound Laterality: Right, Anterior Cleanser: Soap and Water Every Other Day/30 Days Discharge Instructions: May shower and wash wound with dial antibacterial soap and water prior to dressing change. Cleanser: Wound Cleanser (Generic) Every Other Day/30 Days Discharge Instructions: Cleanse the wound with wound cleanser prior to applying a clean dressing using gauze sponges, not tissue or cotton balls. Peri-Wound Care: Skin Prep (Generic) Every Other Day/30 Days Discharge Instructions: Use skin prep as directed Peri-Wound Care: Sween Lotion (Moisturizing lotion) Every Other Day/30 Days Discharge Instructions: Apply moisturizing lotion as directed Prim Dressing: Fibracol Plus Dressing, 4x4.38 in (collagen) Every Other Day/30 Days ary Discharge Instructions: Moisten collagen with saline or hydrogel Secondary Dressing: Zetuvit Plus Silicone Border Dressing 4x4 (in/in) (Generic) Every Other Day/30 Days Discharge Instructions: Apply silicone border over primary dressing as directed. Electronic Signature(s) Signed: 01/19/2022 5:07:45 PM By: Jonathon Shan DO Entered By: [name] on 01/19/2022  15:36:34 -------------------------------------------------------------------------------- Problem List Details Patient Name: Date of Service: [name] 01/19/2022 2:15 PM Medical Record Number: [id number] Patient Account Number: [id number] Date of Birth/Sex: Treating RN: [birth year] (88 y.o. [name] Primary Care Provider: Scarlette Shea Other Clinician: Referring Provider: Treating Provider/Extender: [name] in Treatment: 16 Active Problems ICD-10 Encounter Code Description Active Date MDM Diagnosis S80.11XD Contusion of right lower leg, subsequent encounter 09/26/2021 No Yes L97.818 Non-pressure chronic ulcer of other part of right lower leg with other specified  09/26/2021 No Yes severity I87.311 Chronic venous hypertension (idiopathic) with ulcer of right lower extremity 09/26/2021 No Yes I70.238 Atherosclerosis of native arteries of right leg with ulceration of other part of 10/14/2021 No Yes lower leg Inactive Problems Resolved Problems Electronic Signature(s) Signed: 01/19/2022 5:07:45 PM By: Jonathon Shan DO Entered By: [name] on 01/19/2022 15:35:21 -------------------------------------------------------------------------------- Progress Note Details Patient Name: Date of Service: [name]. 01/19/2022 2:15 PM Medical Record Number: [id number] Patient Account Number: [id number] Date of Birth/Sex: Treating RN: [birth year] (88 y.o. [name] Primary Care Provider: Scarlette Shea Other Clinician: Referring Provider: Treating Provider/Extender: [name] in Treatment: 16 Subjective Chief Complaint Information obtained from Patient 09/26/2021; patient is here for a review of a complicated wound on the right anterior lower leg which was initially secondary to trauma History of Present Illness (HPI) ADMISSION 09/26/21 This is a 88 year old man who lives near [place]. He is accompanied  by his daughter who lives with him. His wife is also at home. Apparently 2 months ago he traumatized the right anterior lower leg x2 in quick succession including once on the back of a pickup truck or car. By description it sounds as though he had a black eschar on this which fell off about a month ago. He has been left with an open wound. It this is not that large but is completely slough covered and has undermining of at least a centimeter and half from 12-6 o'clock. He received a course of antibiotics earlier this month from his primary care doctor Ceftin. He is not on antibiotics currently. The patient has a complicated relevant history. 2019 he underwent bilateral common iliac artery stenting and angioplasties by [name]. He had ABIs at the same time which showed a ABI at 0.55 on the right and a TBI of 0.38. He had a TBI of 0.43 on the left they could not do an ABI because he could not tolerate the pressure cuff. He has not followed up. The patient is apparently a diabetic although he has not been on any treatment in years and its not clear that he is even followed up. Other past medical history includes coronary artery disease, ischemic cardiomyopathy status post pacemaker, he is on Plavix, He did not cooperate with our attempt to do ABIs on the right leg in our clinic 5/5; patient presents for follow-up. Last week he was started on Iodosorb under Kerlix/Coban. He has no issues or complaints today. He has his venous and arterial studies scheduled for next week. He currently denies signs of infection. 5/16; patient presents for follow-up. Last week he was started on Hydrofera Blue and gentamicin and mupirocin ointment under Kerlix/Coban. He tolerated this well. He had a PCR culture that reported high levels of Streptococcus agalactiae, Pseudomonas aeruginosa and Enterococcus bacillus. Keystone antibiotics were ordered and he has this however did not bring it today. He had ABIs with TBI's and  venous reflux studies. He had evidence of reflux throughout the right and left venous system. His ABIs on the right was noncompressible with a TBI of 0.19. He currently denies systemic signs of infection. 5/23; patient presents for follow-up. He has been using Keystone antibiotic to the wound bed without issues. He currently denies signs of infection. He has not heard back from [name] office. 6/8; patient is back his wound is somewhat better with less undermining but still the same basic surface area. The wound surface looks better in terms of nonviable material. But the other  issue that concerns me is this patient probably has some minimal distance claudication and may even have claudication at rest at night in the right leg. Referencing my notes from his admission he has had previous bilateral common iliac artery stenting and angioplasties in 2019. For some reason I do not know that he ever had any follow-up. 6/19; patient presents for follow-up. He saw Dr. Luan Pulling on 6/16 that recommended a CT angiogram to evaluate iliac stents and right common femoral disease. Patient is using Keystone antibiotic with [facility] without issues. He denies signs of infection. 6/26; patient presents for follow-up. He had a CT angiogram that showed a patent common iliac artery. There is advanced right femoral popliteal disease including SFA stenosis with plaque extending into the SFA and profunda femoris. 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 He has an appointment scheduled tomorrow with Dr. Roselie Awkward to review these results. 7/10; patient presents for follow-up. He is scheduled for right femoral endarterectomy on 7/13 with Dr. Roselie Awkward. He has developed a smaller wound more proximal to the original wound. He is not sure how this started. He has been using Keystone antibiotic and Hydrofera Blue to the wound bed. He denies signs of infection. 7/18;  patient presents for follow-up. On 7/13 he had a right lower extremity angiogram with right common femoral and profunda femoris artery angioplasty and stenting. The previous plan for femoral endarterectomy was canceled. He has been using Keystone and [facility] to the wound bed. He currently denies signs of infection. 7/25; patient presents for follow-up. He has been using Keystone and [facility] to the wound bed. He reports improvement in his chronic pain and wound healing. 7/31; patient presents for follow-up. He has been using Keystone and [facility] to the wound bed. He has no issues or complaints today. 8/8; patient presents for follow-up. He has been using Keystone and [facility] to the wound bed. The blue dressing was stuck to the wound bed today. 8/21; patient presents for follow-up. Patient has been using Medihoney to the wound bed for the past week. He reports improvement in wound healing. He has no issues or complaints today. Patient History Information obtained from Patient, Chart. Family History Cancer - Siblings, Kidney Disease - Father. Social History Former smoker - quit 1985, Marital Status - Married, Alcohol Use - Never, Drug Use - No History, Caffeine Use - Daily. Medical History Eyes Patient has history of Cataracts - Extraction 2018 Hematologic/Lymphatic Patient has history of Anemia Respiratory Patient has history of Asthma, Chronic Obstructive Pulmonary Disease (COPD) Cardiovascular Patient has history of Congestive Heart Failure, Coronary Artery Disease, Hypertension, Myocardial Infarction - 2009, Peripheral Arterial Disease Endocrine Patient has history of Type II Diabetes - Diet Controlled-does not take any medications Hospitalization/Surgery History - angioplasty stent- 2019 [facility]. - carotid stents. Medical A Surgical History Notes nd Eyes thyroid eye disease, hypertrophia of right eye, hypotropia of left eye, diplopia- corrected per  daughter Cardiovascular Pacemaker/defibrillator Endocrine Thyroiditis, Hypothyroidism Objective Constitutional respirations regular, non-labored and within target range for patient.. Vitals Time Taken: 2:33 PM, Height: 69 in, Weight: 168 lbs, BMI: 24.8, Temperature: 97.8 F, Pulse: 52 bpm, Respiratory Rate: 18 breaths/min, Blood Pressure: 128/50 mmHg. Cardiovascular 2+ dorsalis pedis/posterior tibialis pulses. Psychiatric pleasant and cooperative. General Notes: Right lower extremity: T the mid tibial aspect there is an open wound with granulation tissue and nonviable tissue. No signs of surrounding o infection. Integumentary (Hair, Skin) Wound #1 status is Open. Original  cause of wound was Trauma. The date acquired was: 07/02/2021. The wound has been in treatment 16 weeks. The wound is located on the Right,Anterior Lower Leg. The wound measures 0.6cm length x 0.5cm width x 0.2cm depth; 0.236cm^2 area and 0.047cm^3 volume. There is Fat Layer (Subcutaneous Tissue) exposed. There is no tunneling or undermining noted. There is a medium amount of serosanguineous drainage noted. The wound margin is thickened. There is large (67-100%) red, pink granulation within the wound bed. There is a small (1-33%) amount of necrotic tissue within the wound bed including Adherent Slough. Assessment Active Problems ICD-10 Contusion of right lower leg, subsequent encounter Non-pressure chronic ulcer of other part of right lower leg with other specified severity Chronic venous hypertension (idiopathic) with ulcer of right lower extremity Atherosclerosis of native arteries of right leg with ulceration of other part of lower leg Patient's wound has shown improvement in size and appearance since last clinic visit. I debrided nonviable tissue. At this time I recommended switching the dressing to collagen. Follow-up in 1 week. Procedures Wound #1 Pre-procedure diagnosis of Wound #1 is an Abrasion located on  the Right,Anterior Lower Leg .Severity of Tissue Pre Debridement is: Fat layer exposed. There was a Excisional Skin/Subcutaneous Tissue Debridement with a total area of 1 sq cm performed by Jonathon Shan, DO. With the following instrument(s): Curette to remove Viable and Non-Viable tissue/material. Material removed includes Subcutaneous Tissue and Slough and after achieving pain control using Lidocaine 5% topical ointment. A time out was conducted at 15:00, prior to the start of the procedure. A Minimum amount of bleeding was controlled with Pressure. The procedure was tolerated well with a pain level of 0 throughout and a pain level of 0 following the procedure. Post Debridement Measurements: 0.6cm length x 0.5cm width x 0.1cm depth; 0.024cm^3 volume. Character of Wound/Ulcer Post Debridement requires further debridement. Severity of Tissue Post Debridement is: Fat layer exposed. Post procedure Diagnosis Wound #1: Same as Pre-Procedure Plan Follow-up Appointments: Return Appointment in 1 week. - Dr. Heber Struthers and [place], Room 8 01/26/2022 215pm Bathing/ Shower/ Hygiene: May shower with protection but do not get wound dressing(s) wet. Edema Control - Lymphedema / SCD / Other: Elevate legs to the level of the heart or above for 30 minutes daily and/or when sitting, a frequency of: - 3-4 times a day throughout the day. Avoid standing for long periods of time. Exercise regularly WOUND #1: - Lower Leg Wound Laterality: Right, Anterior Cleanser: Soap and Water Every Other Day/30 Days Discharge Instructions: May shower and wash wound with dial antibacterial soap and water prior to dressing change. Cleanser: Wound Cleanser (Generic) Every Other Day/30 Days Discharge Instructions: Cleanse the wound with wound cleanser prior to applying a clean dressing using gauze sponges, not tissue or cotton balls. Peri-Wound Care: Skin Prep (Generic) Every Other Day/30 Days Discharge Instructions: Use skin prep as  directed Peri-Wound Care: Sween Lotion (Moisturizing lotion) Every Other Day/30 Days Discharge Instructions: Apply moisturizing lotion as directed Prim Dressing: Fibracol Plus Dressing, 4x4.38 in (collagen) Every Other Day/30 Days ary Discharge Instructions: Moisten collagen with saline or hydrogel Secondary Dressing: Zetuvit Plus Silicone Border Dressing 4x4 (in/in) (Generic) Every Other Day/30 Days Discharge Instructions: Apply silicone border over primary dressing as directed. 1. In office sharp debridement 2. Collagen 3. Follow-up in 1 week Electronic Signature(s) Signed: 01/19/2022 5:07:45 PM By: Jonathon Shan DO Entered By: [name] on 01/19/2022 15:37:06 -------------------------------------------------------------------------------- HxROS Details Patient Name: Date of Service: [name]. 01/19/2022 2:15 PM Medical Record  Number: [id number] Patient Account Number: [id number] Date of Birth/Sex: Treating RN: Dec 01, 1933 (88 y.o. [name], [name] Primary Care Provider: Scarlette Shea Other Clinician: Referring Provider: Treating Provider/Extender: [name] in Treatment: 16 Information Obtained From Patient Chart Eyes Medical History: Positive for: Cataracts - Extraction 2018 Past Medical History Notes: thyroid eye disease, hypertrophia of right eye, hypotropia of left eye, diplopia- corrected per daughter Hematologic/Lymphatic Medical History: Positive for: Anemia Respiratory Medical History: Positive for: Asthma; Chronic Obstructive Pulmonary Disease (COPD) Cardiovascular Medical History: Positive for: Congestive Heart Failure; Coronary Artery Disease; Hypertension; Myocardial Infarction - 2009; Peripheral Arterial Disease Past Medical History Notes: Pacemaker/defibrillator Endocrine Medical History: Positive for: Type II Diabetes - Diet Controlled-does not take any medications Past Medical History Notes: Thyroiditis,  Hypothyroidism Time with diabetes: 2019 Treated with: Diet Blood sugar tested every day: No HBO Extended History Items Eyes: Cataracts Immunizations Pneumococcal Vaccine: Received Pneumococcal Vaccination: No Implantable Devices Yes Hospitalization / Surgery History Type of Hospitalization/Surgery angioplasty stent- 2019 [facility] carotid stents Family and Social History Cancer: Yes - Siblings; Kidney Disease: Yes - Father; Former smoker - quit 1985; Marital Status - Married; Alcohol Use: Never; Drug Use: No History; Caffeine Use: Daily; Financial Concerns: No; Food, Clothing or Shelter Needs: No; Support System Lacking: No; Transportation Concerns: No Electronic Signature(s) Signed: 01/19/2022 4:49:28 PM By: Jonathon Pilling RN, BSN Signed: 01/19/2022 5:07:45 PM By: Jonathon Shan DO Entered By: [name] on 01/19/2022 15:36:12 -------------------------------------------------------------------------------- SuperBill Details Patient Name: Date of Service: [name] 01/19/2022 Medical Record Number: [id number] Patient Account Number: [id number] Date of Birth/Sex: Treating RN: 1934/05/06 (88 y.o. 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

## 2022-01-19 NOTE — Progress Notes (Signed)
[name], [name] ([id number]) Visit Report for 01/19/2022 Arrival Information Details Patient Name: Date of Service: [name], [name] 01/19/2022 2:15 PM Medical Record Number: [id number] Patient Account Number: [id number] Date of Birth/Sex: Treating RN: 01-02-1934 (88 y.o. [name], [name] Primary Care Kashay Cavenaugh: [name] Other Clinician: Referring Thersa Mohiuddin: Treating Jaedynn Bohlken/Extender: [name] in Treatment: 57 Visit Information History Since Last Visit Added or deleted any medications: No Patient Arrived: Cane Any new allergies or adverse reactions: No Arrival Time: 14:30 Had a fall or experienced change in No Accompanied By: Daughter activities of daily living that may affect Transfer Assistance: None risk of falls: Patient Identification Verified: Yes Signs or symptoms of abuse/neglect since last visito No Secondary Verification Process Completed: Yes Hospitalized since last visit: No Patient Requires Transmission-Based Precautions: No Implantable device outside of the clinic excluding No Patient Has Alerts: Yes cellular tissue based products placed in the center Patient Alerts: [id number] to obtain;pain since last visit: Has Dressing in Place as Prescribed: Yes Pain Present Now: No Electronic Signature(s) Signed: 01/19/2022 4:55:34 PM By: Erenest Blank Entered By: Erenest Blank on 01/19/2022 14:30:42 -------------------------------------------------------------------------------- Encounter Discharge Information Details Patient Name: Date of Service: [name]. 01/19/2022 2:15 PM Medical Record Number: [id number] Patient Account Number: [id number] Date of Birth/Sex: Treating RN: 04/12/1934 (88 y.o. [name] Primary Care Pernell Dikes: [name] Other Clinician: Referring Mikah Poss: Treating Tenelle Andreason/Extender: [name] in Treatment: 16 Encounter Discharge Information Items Post Procedure  Vitals Discharge Condition: Stable Temperature (F): 97.8 Ambulatory Status: Cane Pulse (bpm): 52 Discharge Destination: Home Respiratory Rate (breaths/min): 18 Transportation: Private Auto Blood Pressure (mmHg): 128/50 Accompanied By: daughter Schedule Follow-up Appointment: Yes Clinical Summary of Care: Electronic Signature(s) Signed: 01/19/2022 4:49:28 PM By: Deon Pilling RN, BSN Entered By: Deon Pilling on 01/19/2022 15:09:52 -------------------------------------------------------------------------------- Lower Extremity Assessment Details Patient Name: Date of Service: [name]. 01/19/2022 2:15 PM Medical Record Number: [id number] Patient Account Number: [id number] Date of Birth/Sex: Treating RN: [birth year] (88 y.o. [name] Primary Care Shermeka Rutt: [name] Other Clinician: Referring Dwyane Dupree: Treating Eastyn Dattilo/Extender: [name] in Treatment: 16 Edema Assessment Assessed: [name]: No] [name]: No] Edema: [Left: Ye] [Right: s] Calf Left: Right: Point of Measurement: 31 cm From Medial Instep 33 cm Ankle Left: Right: Point of Measurement: 7 cm From Medial Instep 27 cm Electronic Signature(s) Signed: 01/19/2022 4:49:28 PM By: Deon Pilling RN, BSN Signed: 01/19/2022 4:55:34 PM By: Erenest Blank Entered By: Erenest Blank on 01/19/2022 14:39:18 -------------------------------------------------------------------------------- Multi Wound Chart Details Patient Name: Date of Service: [name]. 01/19/2022 2:15 PM Medical Record Number: [id number] Patient Account Number: [id number] Date of Birth/Sex: Treating RN: [birth year] (88 y.o. [name] Primary Care Leelynn Whetsel: [name] Other Clinician: Referring Makita Blow: Treating Trelyn Vanderlinde/Extender: [name] in Treatment: 16 Vital Signs Height(in): 4 Pulse(bpm): 64 Weight(lbs): 168 Blood Pressure(mmHg): 128/50 Body Mass Index(BMI):  24.8 Temperature(F): 97.8 Respiratory Rate(breaths/min): 18 Photos: [N/A:N/A] Right, Anterior Lower Leg N/A N/A Wound Location: Trauma N/A N/A Wounding Event: Abrasion N/A N/A Primary Etiology: Diabetic Wound/Ulcer of the Lower N/A N/A Secondary Etiology: Extremity Cataracts, Anemia, Asthma, Chronic N/A N/A Comorbid History: Obstructive Pulmonary Disease (COPD), Congestive Heart Failure, Coronary Artery Disease, Hypertension, Myocardial Infarction, Peripheral Arterial Disease, Type II Diabetes 07/02/2021 N/A N/A Date Acquired: 16 N/A N/A Weeks of Treatment: Open N/A N/A Wound Status: No N/A N/A Wound Recurrence: 0.6x0.5x0.2 N/A N/A Measurements L x W x D (cm) 0.236 N/A N/A A (cm) : rea 0.047 N/A N/A Volume (  cm) : 93.50% N/A N/A % Reduction in A rea: 96.70% N/A N/A % Reduction in Volume: Full Thickness Without Exposed N/A N/A Classification: Support Structures Medium N/A N/A Exudate A mount: Serosanguineous N/A N/A Exudate Type: red, brown N/A N/A Exudate Color: Thickened N/A N/A Wound Margin: Large (67-100%) N/A N/A Granulation A mount: Red, Pink N/A N/A Granulation Quality: Small (1-33%) N/A N/A Necrotic A mount: Fat Layer (Subcutaneous Tissue): Yes N/A N/A Exposed Structures: Fascia: No Tendon: No Muscle: No Joint: No Bone: No Medium (34-66%) N/A N/A Epithelialization: Debridement - Excisional N/A N/A Debridement: Pre-procedure Verification/Time Out 15:00 N/A N/A Taken: Lidocaine 5% topical ointment N/A N/A Pain Control: Subcutaneous, Slough N/A N/A Tissue Debrided: Skin/Subcutaneous Tissue N/A N/A Level: 1 N/A N/A Debridement A (sq cm): rea Curette N/A N/A Instrument: Minimum N/A N/A Bleeding: Pressure N/A N/A Hemostasis A chieved: 0 N/A N/A Procedural Pain: 0 N/A N/A Post Procedural Pain: Procedure was tolerated well N/A N/A Debridement Treatment Response: 0.6x0.5x0.1 N/A N/A Post Debridement Measurements L x W x D  (cm) 0.024 N/A N/A Post Debridement Volume: (cm) Debridement N/A N/A Procedures Performed: Treatment Notes Wound #1 (Lower Leg) Wound Laterality: Right, Anterior Cleanser Soap and Water Discharge Instruction: May shower and wash wound with dial antibacterial soap and water prior to dressing change. Wound Cleanser Discharge Instruction: Cleanse the wound with wound cleanser prior to applying a clean dressing using gauze sponges, not tissue or cotton balls. Peri-Wound Care Skin Prep Discharge Instruction: Use skin prep as directed Sween Lotion (Moisturizing lotion) Discharge Instruction: Apply moisturizing lotion as directed Topical Primary Dressing Fibracol Plus Dressing, 4x4.38 in (collagen) Discharge Instruction: Moisten collagen with saline or hydrogel Secondary Dressing Zetuvit Plus Silicone Border Dressing 4x4 (in/in) Discharge Instruction: Apply silicone border over primary dressing as directed. Secured With Compression Wrap Compression Stockings Environmental education officer) Signed: 01/19/2022 4:49:28 PM By: Deon Pilling RN, BSN Signed: 01/19/2022 5:07:45 PM By: Kalman Shan DO Entered By: Kalman Shan on 01/19/2022 15:35:26 -------------------------------------------------------------------------------- Multi-Disciplinary Care Plan Details Patient Name: Date of Service: [name] 01/19/2022 2:15 PM Medical Record Number: [id number] Patient Account Number: [id number] Date of Birth/Sex: Treating RN: [birth year] (88 y.o. [name] Primary Care Bunny Lowdermilk: [name] Other Clinician: Referring Kelby Lotspeich: Treating Darcy Barbara/Extender: [name] in Treatment: 16 Active Inactive Tissue Oxygenation Nursing Diagnoses: Potential alteration in peripheral tissue perfusion (select prior to confirmation of diagnosis) Goals: Non-invasive arterial studies are completed as ordered Date Initiated: 10/14/2021 Target Resolution  Date: 01/30/2022 Goal Status: Active Interventions: Assess patient understanding of disease process and management upon diagnosis and as needed Assess peripheral arterial status upon admission and as needed Provide education on tissue oxygenation and ischemia Treatment Activities: Ankle Brachial Index (ABI) : 09/30/2021 Non-invasive vascular studies : 10/14/2021 Notes: Wound/Skin Impairment Nursing Diagnoses: Knowledge deficit related to ulceration/compromised skin integrity Goals: Patient/caregiver will verbalize understanding of skin care regimen Date Initiated: 09/26/2021 Target Resolution Date: 01/30/2022 Goal Status: Active Interventions: Assess patient/caregiver ability to perform ulcer/skin care regimen upon admission and as needed Assess ulceration(s) every visit Provide education on ulcer and skin care Treatment Activities: Skin care regimen initiated : 09/26/2021 Topical wound management initiated : 09/26/2021 Notes: 10/21/21: Wound care regimen continues, using [employer] and [employer]. Electronic Signature(s) Signed: 01/19/2022 4:49:28 PM By: Deon Pilling RN, BSN Entered By: Deon Pilling on 01/19/2022 14:46:45 -------------------------------------------------------------------------------- Pain Assessment Details Patient Name: Date of Service: [name], Jonathon W. 01/19/2022 2:15 PM Medical Record Number: [id number] Patient Account Number: [id number] Date of Birth/Sex: Treating RN: Jul 04, 1933 (88  y.o. [name] Primary Care Shantika Bermea: [name] Other Clinician: Referring Yaret Hush: Treating Tanysha Quant/Extender: [name] in Treatment: 16 Active Problems Location of Pain Severity and Description of Pain Patient Has Paino No Site Locations Pain Management and Medication Current Pain Management: Electronic Signature(s) Signed: 01/19/2022 4:49:28 PM By: Deon Pilling RN, BSN Signed: 01/19/2022 4:55:34 PM By: Erenest Blank Entered By:  Erenest Blank on 01/19/2022 14:33:30 -------------------------------------------------------------------------------- Patient/Caregiver Education Details Patient Name: Date of Service: [name] 8/21/2023andnbsp2:15 PM Medical Record Number: [id number] Patient Account Number: [id number] Date of Birth/Gender: Treating RN: 12-01-1933 (88 y.o. [name] Primary Care Physician: [name] Other Clinician: Referring Physician: Treating Physician/Extender: [name] in Treatment: 48 Education Assessment Education Provided To: Patient Education Topics Provided Wound/Skin Impairment: Handouts: Skin Care Do's and Dont's Methods: Explain/Verbal Responses: Reinforcements needed Electronic Signature(s) Signed: 01/19/2022 4:49:28 PM By: Deon Pilling RN, BSN Entered By: Deon Pilling on 01/19/2022 14:46:57 -------------------------------------------------------------------------------- Wound Assessment Details Patient Name: Date of Service: [name]. 01/19/2022 2:15 PM Medical Record Number: [id number] Patient Account Number: [id number] Date of Birth/Sex: Treating RN: May 17, 1934 (88 y.o. [name], Meta.Reding Primary Care Illyria Sobocinski: [name] Other Clinician: Referring Tzipora Mcinroy: Treating Thessaly Mccullers/Extender: [name] in Treatment: 16 Wound Status Wound Number: 1 Primary Abrasion Etiology: Wound Location: Right, Anterior Lower Leg Secondary Diabetic Wound/Ulcer of the Lower Extremity Wounding Event: Trauma Etiology: Date Acquired: 07/02/2021 Wound Open Weeks Of Treatment: 16 Status: Clustered Wound: No Comorbid Cataracts, Anemia, Asthma, Chronic Obstructive Pulmonary History: Disease (COPD), Congestive Heart Failure, Coronary Artery Disease, Hypertension, Myocardial Infarction, Peripheral Arterial Disease, Type II Diabetes Photos Wound Measurements Length: (cm) 0.6 Width: (cm) 0.5 Depth: (cm)  0.2 Area: (cm) 0.236 Volume: (cm) 0.047 % Reduction in Area: 93.5% % Reduction in Volume: 96.7% Epithelialization: Medium (34-66%) Tunneling: No Undermining: No Wound Description Classification: Full Thickness Without Exposed Support Structures Wound Margin: Thickened Exudate Amount: Medium Exudate Type: Serosanguineous Exudate Color: red, brown Foul Odor After Cleansing: No Slough/Fibrino Yes Wound Bed Granulation Amount: Large (67-100%) Exposed Structure Granulation Quality: Red, Pink Fascia Exposed: No Necrotic Amount: Small (1-33%) Fat Layer (Subcutaneous Tissue) Exposed: Yes Necrotic Quality: Adherent Slough Tendon Exposed: No Muscle Exposed: No Joint Exposed: No Bone Exposed: No Treatment Notes Wound #1 (Lower Leg) Wound Laterality: Right, Anterior Cleanser Soap and Water Discharge Instruction: May shower and wash wound with dial antibacterial soap and water prior to dressing change. Wound Cleanser Discharge Instruction: Cleanse the wound with wound cleanser prior to applying a clean dressing using gauze sponges, not tissue or cotton balls. Peri-Wound Care Skin Prep Discharge Instruction: Use skin prep as directed Sween Lotion (Moisturizing lotion) Discharge Instruction: Apply moisturizing lotion as directed Topical Primary Dressing Fibracol Plus Dressing, 4x4.38 in (collagen) Discharge Instruction: Moisten collagen with saline or hydrogel Secondary Dressing Zetuvit Plus Silicone Border Dressing 4x4 (in/in) Discharge Instruction: Apply silicone border over primary dressing as directed. Secured With Compression Wrap Compression Stockings Environmental education officer) Signed: 01/19/2022 4:49:28 PM By: Deon Pilling RN, BSN Signed: 01/19/2022 4:55:34 PM By: Erenest Blank Entered By: Erenest Blank on 01/19/2022 14:40:11 -------------------------------------------------------------------------------- Vitals Details Patient Name: Date of  Service: [name] 01/19/2022 2:15 PM Medical Record Number: [id number] Patient Account Number: [id number] Date of Birth/Sex: Treating RN: Feb 12, 1934 (88 y.o. [name] Primary Care Tamaj Jurgens: [name] Other Clinician: Referring Dayten Juba: Treating Shadawn Hanaway/Extender: [name] in Treatment: 16 Vital Signs Time Taken: 14:33 Temperature (F): 97.8 Height (in): 69 Pulse (bpm): 52 Weight (lbs):  168 Respiratory Rate (breaths/min): 18 Body Mass Index (BMI): 24.8 Blood Pressure (mmHg): 128/50 Reference Range: 80 - 120 mg / dl Electronic Signature(s) Signed: 01/19/2022 4:55:34 PM By: Erenest Blank Entered By: Erenest Blank on 01/19/2022 14:33:21

## 2022-01-23 ENCOUNTER — Other Ambulatory Visit: Payer: Self-pay | Admitting: Internal Medicine

## 2022-01-23 DIAGNOSIS — I739 Peripheral vascular disease, unspecified: Secondary | ICD-10-CM

## 2022-01-23 DIAGNOSIS — I6523 Occlusion and stenosis of bilateral carotid arteries: Secondary | ICD-10-CM

## 2022-01-26 ENCOUNTER — Encounter (HOSPITAL_BASED_OUTPATIENT_CLINIC_OR_DEPARTMENT_OTHER): Payer: Medicare Other | Admitting: Internal Medicine

## 2022-01-26 DIAGNOSIS — I2510 Atherosclerotic heart disease of native coronary artery without angina pectoris: Secondary | ICD-10-CM | POA: Diagnosis not present

## 2022-01-26 DIAGNOSIS — I70238 Atherosclerosis of native arteries of right leg with ulceration of other part of lower right leg: Secondary | ICD-10-CM

## 2022-01-26 DIAGNOSIS — S8011XD Contusion of right lower leg, subsequent encounter: Secondary | ICD-10-CM

## 2022-01-26 DIAGNOSIS — I87311 Chronic venous hypertension (idiopathic) with ulcer of right lower extremity: Secondary | ICD-10-CM

## 2022-01-26 DIAGNOSIS — Z950 Presence of cardiac pacemaker: Secondary | ICD-10-CM | POA: Diagnosis not present

## 2022-01-26 DIAGNOSIS — S8011XA Contusion of right lower leg, initial encounter: Secondary | ICD-10-CM | POA: Diagnosis not present

## 2022-01-26 DIAGNOSIS — L97818 Non-pressure chronic ulcer of other part of right lower leg with other specified severity: Secondary | ICD-10-CM | POA: Diagnosis not present

## 2022-01-26 DIAGNOSIS — I255 Ischemic cardiomyopathy: Secondary | ICD-10-CM | POA: Diagnosis not present

## 2022-01-26 DIAGNOSIS — Z7902 Long term (current) use of antithrombotics/antiplatelets: Secondary | ICD-10-CM | POA: Diagnosis not present

## 2022-01-26 NOTE — Progress Notes (Signed)
LAUREN, AGUAYO ([id number]) Visit Report for 01/26/2022 Chief Complaint Document Details Patient Name: Date of Service: [name], [name] 01/26/2022 2:15 PM Medical Record Number: [id number] Patient Account Number: [id number] Date of Birth/Sex: Treating RN: [birth year] (88 y.o. [name] Primary Care Provider: [name] Other Clinician: Referring Provider: Treating Provider/Extender: [name] in Treatment: 17 Information Obtained from: Patient Chief Complaint 09/26/2021; patient is here for a review of a complicated wound on the right anterior lower leg which was initially secondary to trauma Electronic Signature(s) Signed: 01/26/2022 4:36:36 PM By: Kalman Shan DO Entered By: Kalman Shan on 01/26/2022 15:15:20 -------------------------------------------------------------------------------- HPI Details Patient Name: Date of Service: [name] 01/26/2022 2:15 PM Medical Record Number: [id number] Patient Account Number: [id number] Date of Birth/Sex: Treating RN: December 26, 1933 (88 y.o. [name] Primary Care Provider: [name] Other Clinician: Referring Provider: Treating Provider/Extender: [name] in Treatment: 17 History of Present Illness HPI Description: ADMISSION 09/26/21 This is a 88 year old man who lives near [place]. He is accompanied by his daughter who lives with him. His wife is also at home. Apparently 2 months ago he traumatized the right anterior lower leg x2 in quick succession including once on the back of a pickup truck or car. By description it sounds as though he had a black eschar on this which fell off about a month ago. He has been left with an open wound. It this is not that large but is completely slough covered and has undermining of at least a centimeter and half from 12-6 o'clock. He received a course of antibiotics earlier this month from his primary care doctor Ceftin.  He is not on antibiotics currently. The patient has a complicated relevant history. 2019 he underwent bilateral common iliac artery stenting and angioplasties by Dr. Carlis Abbott. He had ABIs at the same time which showed a ABI at 0.55 on the right and a TBI of 0.38. He had a TBI of 0.43 on the left they could not do an ABI because he could not tolerate the pressure cuff. He has not followed up. The patient is apparently a diabetic although he has not been on any treatment in years and its not clear that he is even followed up. Other past medical history includes coronary artery disease, ischemic cardiomyopathy status post pacemaker, he is on Plavix, He did not cooperate with our attempt to do ABIs on the right leg in our clinic 5/5; patient presents for follow-up. Last week he was started on Iodosorb under Kerlix/Coban. He has no issues or complaints today. He has his venous and arterial studies scheduled for next week. He currently denies signs of infection. 5/16; patient presents for follow-up. Last week he was started on Hydrofera Blue and gentamicin and mupirocin ointment under Kerlix/Coban. He tolerated this well. He had a PCR culture that reported high levels of Streptococcus agalactiae, Pseudomonas aeruginosa and Enterococcus bacillus. Keystone antibiotics were ordered and he has this however did not bring it today. He had ABIs with TBI's and venous reflux studies. He had evidence of reflux throughout the right and left venous system. His ABIs on the right was noncompressible with a TBI of 0.19. He currently denies systemic signs of infection. 5/23; patient presents for follow-up. He has been using Keystone antibiotic to the wound bed without issues. He currently denies signs of infection. He has not heard back from Dr. Kennon Holter office. 6/8; patient is back his wound is somewhat better with less undermining but still  the same basic surface area. The wound surface looks better in terms of nonviable  material. But the other issue that concerns me is this patient probably has some minimal distance claudication and may even have claudication at rest at night in the right leg. Referencing my notes from his admission he has had previous bilateral common iliac artery stenting and angioplasties in 2019. For some reason I do not know that he ever had any follow-up. 6/19; patient presents for follow-up. He saw Dr. Luan Pulling on 6/16 that recommended a CT angiogram to evaluate iliac stents and right common femoral disease. Patient is using Keystone antibiotic with [facility] without issues. He denies signs of infection. 6/26; patient presents for follow-up. He had a CT angiogram that showed a patent common iliac artery. There is advanced right femoral popliteal disease including SFA stenosis with plaque extending into the SFA and profunda femoris. 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 He has an appointment scheduled tomorrow with Dr. Roselie Awkward to review these results. 7/10; patient presents for follow-up. He is scheduled for right femoral endarterectomy on 7/13 with Dr. Roselie Awkward. He has developed a smaller wound more proximal to the original wound. He is not sure how this started. He has been using Keystone antibiotic and Hydrofera Blue to the wound bed. He denies signs of infection. 7/18; patient presents for follow-up. On 7/13 he had a right lower extremity angiogram with right common femoral and profunda femoris artery angioplasty and stenting. The previous plan for femoral endarterectomy was canceled. He has been using Keystone and [facility] to the wound bed. He currently denies signs of infection. 7/25; patient presents for follow-up. He has been using Keystone and [facility] to the wound bed. He reports improvement in his chronic pain and wound healing. 7/31; patient presents for follow-up. He has been using Keystone and [facility] to the wound bed. He has no issues or complaints today. 8/8; patient presents for follow-up. He has been using Keystone and [facility] to the wound bed. The blue dressing was stuck to the wound bed today. 8/21; patient presents for follow-up. Patient has been using Medihoney to the wound bed for the past week. He reports improvement in wound healing. He has no issues or complaints today. 8/28; patient presents for follow-up. We have been using collagen to the wound bed for the past week. He reports improvement in wound healing. He has no issues or complaints today. Electronic Signature(s) Signed: 01/26/2022 4:36:36 PM By: Kalman Shan DO Entered By: Kalman Shan on 01/26/2022 15:16:04 -------------------------------------------------------------------------------- Physical Exam Details Patient Name: Date of Service: [place], Jahan W. 01/26/2022 2:15 PM Medical Record Number: [id number] Patient Account Number: [id number] Date of Birth/Sex: Treating RN: [birth year] (88 y.o. [name] Primary Care Provider: [name] Other Clinician: Referring Provider: Treating Provider/Extender: [name] in Treatment: 17 Constitutional respirations regular, non-labored and within target range for patient.. Cardiovascular 2+ dorsalis pedis/posterior tibialis pulses. Psychiatric pleasant and cooperative. Notes Right lower extremity: T the mid tibial aspect there is an open wound with granulation tissue throuhout. o Electronic Signature(s) Signed: 01/26/2022 4:36:36 PM By: Kalman Shan DO Entered By: Kalman Shan on 01/26/2022 15:16:48 -------------------------------------------------------------------------------- Physician Orders Details Patient Name: Date of Service: [name] 01/26/2022 2:15 PM Medical Record Number: [id number] Patient Account Number: [id number] Date of Birth/Sex: Treating RN: 1933-09-07 (88 y.o. [name] Primary Care Provider: [name] Other Clinician: Referring Provider: Treating Provider/Extender: Kalman Shan  Edyth Gunnels in Treatment: 17 Verbal / Phone Orders: No Diagnosis Coding ICD-10 Coding Code Description S80.11XD Contusion of right lower leg, subsequent encounter L97.818 Non-pressure chronic ulcer of other part of right lower leg with other specified severity I87.311 Chronic venous hypertension (idiopathic) with ulcer of right lower extremity I70.238 Atherosclerosis of native arteries of right leg with ulceration of other part of lower leg Follow-up Appointments ppointment in 2 weeks. - Dr. Heber [place] and [place], Room 8 130pm 02/12/2022 Return A Bathing/ Shower/ Hygiene May shower with protection but do not get wound dressing(s) wet. Edema Control - Lymphedema / SCD / Other Elevate legs to the level of the heart or above for 30 minutes daily and/or when sitting, a frequency of: - 3-4 times a day throughout the day. Avoid standing for long periods of time. Exercise regularly Wound Treatment Wound #1 - Lower Leg Wound Laterality: Right, Anterior Cleanser: Soap and Water Every Other Day/30 Days Discharge Instructions: May shower and wash wound with dial antibacterial soap and water prior to dressing change. Cleanser: Wound Cleanser (Generic) Every Other Day/30 Days Discharge Instructions: Cleanse the wound with wound cleanser prior to applying a clean dressing using gauze sponges, not tissue or cotton balls. Peri-Wound Care: Skin Prep (Generic) Every Other Day/30 Days Discharge Instructions: Use skin prep as directed Peri-Wound Care: Sween Lotion (Moisturizing lotion) Every Other Day/30 Days Discharge Instructions: Apply moisturizing lotion as directed Prim Dressing: Fibracol Plus Dressing, 4x4.38 in (collagen) Every Other Day/30 Days ary Discharge Instructions: Moisten collagen with saline or hydrogel Secondary Dressing: Zetuvit Plus Silicone Border  Dressing 4x4 (in/in) (Generic) Every Other Day/30 Days Discharge Instructions: Apply silicone border over primary dressing as directed. Electronic Signature(s) Signed: 01/26/2022 4:36:36 PM By: Kalman Shan DO Entered By: Kalman Shan on 01/26/2022 15:16:58 -------------------------------------------------------------------------------- Problem List Details Patient Name: Date of Service: [place], Deontra W. 01/26/2022 2:15 PM Medical Record Number: [id number] Patient Account Number: [id number] Date of Birth/Sex: Treating RN: 22-Feb-1934 (88 y.o. [name], Meta.Reding Primary Care Provider: [name] Other Clinician: Referring Provider: Treating Provider/Extender: [name] in Treatment: 17 Active Problems ICD-10 Encounter Encounter Code Description Active Date MDM Diagnosis S80.11XD Contusion of right lower leg, subsequent encounter 09/26/2021 No Yes L97.818 Non-pressure chronic ulcer of other part of right lower leg with other specified 09/26/2021 No Yes severity I87.311 Chronic venous hypertension (idiopathic) with ulcer of right lower extremity 09/26/2021 No Yes I70.238 Atherosclerosis of native arteries of right leg with ulceration of other part of 10/14/2021 No Yes lower leg Inactive Problems Resolved Problems Electronic Signature(s) Signed: 01/26/2022 4:36:36 PM By: Kalman Shan DO Entered By: Kalman Shan on 01/26/2022 15:15:02 -------------------------------------------------------------------------------- Progress Note Details Patient Name: Date of Service: [name] 01/26/2022 2:15 PM Medical Record Number: [id number] Patient Account Number: [id number] Date of Birth/Sex: Treating RN: January 02, 1934 (88 y.o. [name] Primary Care Provider: [name] Other Clinician: Referring Provider: Treating Provider/Extender: [name] in Treatment: 17 Subjective Chief Complaint Information obtained  from Patient 09/26/2021; patient is here for a review of a complicated wound on the right anterior lower leg which was initially secondary to trauma History of Present Illness (HPI) ADMISSION 09/26/21 This is a 88 year old man who lives near [place]. He is accompanied by his daughter who lives with him. His wife is also at home. Apparently 2 months ago he traumatized the right anterior lower leg x2 in quick succession including once on the back of a pickup truck or car. By description it sounds as though  he had a black eschar on this which fell off about a month ago. He has been left with an open wound. It this is not that large but is completely slough covered and has undermining of at least a centimeter and half from 12-6 o'clock. He received a course of antibiotics earlier this month from his primary care doctor Ceftin. He is not on antibiotics currently. The patient has a complicated relevant history. 2019 he underwent bilateral common iliac artery stenting and angioplasties by Dr. Carlis Abbott. He had ABIs at the same time which showed a ABI at 0.55 on the right and a TBI of 0.38. He had a TBI of 0.43 on the left they could not do an ABI because he could not tolerate the pressure cuff. He has not followed up. The patient is apparently a diabetic although he has not been on any treatment in years and its not clear that he is even followed up. Other past medical history includes coronary artery disease, ischemic cardiomyopathy status post pacemaker, he is on Plavix, He did not cooperate with our attempt to do ABIs on the right leg in our clinic 5/5; patient presents for follow-up. Last week he was started on Iodosorb under Kerlix/Coban. He has no issues or complaints today. He has his venous and arterial studies scheduled for next week. He currently denies signs of infection. 5/16; patient presents for follow-up. Last week he was started on Hydrofera Blue and gentamicin and mupirocin ointment under  Kerlix/Coban. He tolerated this well. He had a PCR culture that reported high levels of Streptococcus agalactiae, Pseudomonas aeruginosa and Enterococcus bacillus. Keystone antibiotics were ordered and he has this however did not bring it today. He had ABIs with TBI's and venous reflux studies. He had evidence of reflux throughout the right and left venous system. His ABIs on the right was noncompressible with a TBI of 0.19. He currently denies systemic signs of infection. 5/23; patient presents for follow-up. He has been using Keystone antibiotic to the wound bed without issues. He currently denies signs of infection. He has not heard back from Dr. Kennon Holter office. 6/8; patient is back his wound is somewhat better with less undermining but still the same basic surface area. The wound surface looks better in terms of nonviable material. But the other issue that concerns me is this patient probably has some minimal distance claudication and may even have claudication at rest at night in the right leg. Referencing my notes from his admission he has had previous bilateral common iliac artery stenting and angioplasties in 2019. For some reason I do not know that he ever had any follow-up. 6/19; patient presents for follow-up. He saw Dr. Luan Pulling on 6/16 that recommended a CT angiogram to evaluate iliac stents and right common femoral disease. Patient is using Keystone antibiotic with [place] without issues. He denies signs of infection. 6/26; patient presents for follow-up. He had a CT angiogram that showed a patent common iliac artery. There is advanced right femoral popliteal disease including SFA stenosis with plaque extending into the SFA and profunda femoris. 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 He has an appointment scheduled tomorrow with Dr. Roselie Awkward to review these results. 7/10; patient presents for follow-up. He is scheduled for  right femoral endarterectomy on 7/13 with Dr. Roselie Awkward. He has developed a smaller wound more proximal to the original wound. He is not sure how this started. He has been using Keystone antibiotic and Hydrofera  Blue to the wound bed. He denies signs of infection. 7/18; patient presents for follow-up. On 7/13 he had a right lower extremity angiogram with right common femoral and profunda femoris artery angioplasty and stenting. The previous plan for femoral endarterectomy was canceled. He has been using Keystone and [facility] to the wound bed. He currently denies signs of infection. 7/25; patient presents for follow-up. He has been using Keystone and [facility] to the wound bed. He reports improvement in his chronic pain and wound healing. 7/31; patient presents for follow-up. He has been using Keystone and [facility] to the wound bed. He has no issues or complaints today. 8/8; patient presents for follow-up. He has been using Keystone and [facility] to the wound bed. The blue dressing was stuck to the wound bed today. 8/21; patient presents for follow-up. Patient has been using Medihoney to the wound bed for the past week. He reports improvement in wound healing. He has no issues or complaints today. 8/28; patient presents for follow-up. We have been using collagen to the wound bed for the past week. He reports improvement in wound healing. He has no issues or complaints today. Patient History Information obtained from Patient, Chart. Family History Cancer - Siblings, Kidney Disease - Father. Social History Former smoker - quit 1985, Marital Status - Married, Alcohol Use - Never, Drug Use - No History, Caffeine Use - Daily. Medical History Eyes Patient has history of Cataracts - Extraction 2018 Hematologic/Lymphatic Patient has history of Anemia Respiratory Patient has history of Asthma, Chronic Obstructive Pulmonary Disease (COPD) Cardiovascular Patient has history of  Congestive Heart Failure, Coronary Artery Disease, Hypertension, Myocardial Infarction - 2009, Peripheral Arterial Disease Endocrine Patient has history of Type II Diabetes - Diet Controlled-does not take any medications Hospitalization/Surgery History - angioplasty stent- 2019 dr.Clark. - carotid stents. Medical A Surgical History Notes nd Eyes thyroid eye disease, hypertrophia of right eye, hypotropia of left eye, diplopia- corrected per daughter Cardiovascular Pacemaker/defibrillator Endocrine Thyroiditis, Hypothyroidism Objective Constitutional respirations regular, non-labored and within target range for patient.. Vitals Time Taken: 2:21 PM, Height: 69 in, Weight: 168 lbs, BMI: 24.8, Temperature: 98.1 F, Pulse: 55 bpm, Respiratory Rate: 20 breaths/min, Blood Pressure: 116/64 mmHg. Cardiovascular 2+ dorsalis pedis/posterior tibialis pulses. Psychiatric pleasant and cooperative. General Notes: Right lower extremity: T the mid tibial aspect there is an open wound with granulation tissue throuhout. o Integumentary (Hair, Skin) Wound #1 status is Open. Original cause of wound was Trauma. The date acquired was: 07/02/2021. The wound has been in treatment 17 weeks. The wound is located on the Right,Anterior Lower Leg. The wound measures 1.3cm length x 0.3cm width x 0.2cm depth; 0.306cm^2 area and 0.061cm^3 volume. There is Fat Layer (Subcutaneous Tissue) exposed. There is no tunneling or undermining noted. There is a medium amount of serosanguineous drainage noted. The wound margin is well defined and not attached to the wound base. There is large (67-100%) red, pink granulation within the wound bed. There is a small (1-33%) amount of necrotic tissue within the wound bed including Adherent Slough. Assessment Active Problems ICD-10 Contusion of right lower leg, subsequent encounter Non-pressure chronic ulcer of other part of right lower leg with other specified severity Chronic venous  hypertension (idiopathic) with ulcer of right lower extremity Atherosclerosis of native arteries of right leg with ulceration of other part of lower leg Patient's wound has shown improvement in size and appearance since last clinic visit. No need for debridement. Wound is well-healing. I recommended Continuing collagen. Follow-up  in 2 weeks. Plan Follow-up Appointments: Return Appointment in 2 weeks. - Dr. Heber [place] [facility] [place], Room 8 130pm 02/12/2022 Bathing/ Shower/ Hygiene: May shower with protection but do not get wound dressing(s) wet. Edema Control - Lymphedema / SCD / Other: Elevate legs to the level of the heart or above for 30 minutes daily and/or when sitting, a frequency of: - 3-4 times a day throughout the day. Avoid standing for long periods of time. Exercise regularly WOUND #1: - Lower Leg Wound Laterality: Right, Anterior Cleanser: Soap and Water Every Other Day/30 Days Discharge Instructions: May shower and wash wound with dial antibacterial soap and water prior to dressing change. Cleanser: Wound Cleanser (Generic) Every Other Day/30 Days Discharge Instructions: Cleanse the wound with wound cleanser prior to applying a clean dressing using gauze sponges, not tissue or cotton balls. Peri-Wound Care: Skin Prep (Generic) Every Other Day/30 Days Discharge Instructions: Use skin prep as directed Peri-Wound Care: Sween Lotion (Moisturizing lotion) Every Other Day/30 Days Discharge Instructions: Apply moisturizing lotion as directed Prim Dressing: Fibracol Plus Dressing, 4x4.38 in (collagen) Every Other Day/30 Days ary Discharge Instructions: Moisten collagen with saline or hydrogel Secondary Dressing: Zetuvit Plus Silicone Border Dressing 4x4 (in/in) (Generic) Every Other Day/30 Days Discharge Instructions: Apply silicone border over primary dressing as directed. 1. Collagen 2. Follow-up in 2 weeks Electronic Signature(s) Signed: 01/26/2022 4:36:36 PM By: Kalman Shan  DO Entered By: Kalman Shan on 01/26/2022 15:18:19 -------------------------------------------------------------------------------- HxROS Details Patient Name: Date of Service: [name] 01/26/2022 2:15 PM Medical Record Number: [id number] Patient Account Number: [id number] Date of Birth/Sex: Treating RN: [birth year] (88 y.o. [name], Meta.Reding Primary Care Provider: [name] Other Clinician: Referring Provider: Treating Provider/Extender: [name] in Treatment: 106 Information Obtained From Patient Chart Eyes Medical History: Positive for: Cataracts - Extraction 2018 Past Medical History Notes: thyroid eye disease, hypertrophia of right eye, hypotropia of left eye, diplopia- corrected per daughter Hematologic/Lymphatic Medical History: Positive for: Anemia Respiratory Medical History: Positive for: Asthma; Chronic Obstructive Pulmonary Disease (COPD) Cardiovascular Medical History: Positive for: Congestive Heart Failure; Coronary Artery Disease; Hypertension; Myocardial Infarction - 2009; Peripheral Arterial Disease Past Medical History Notes: Pacemaker/defibrillator Endocrine Medical History: Positive for: Type II Diabetes - Diet Controlled-does not take any medications Past Medical History Notes: Thyroiditis, Hypothyroidism Time with diabetes: 2019 Treated with: Diet Blood sugar tested every day: No HBO Extended History Items Eyes: Cataracts Immunizations Pneumococcal Vaccine: Received Pneumococcal Vaccination: No Implantable Devices Yes Hospitalization / Surgery History Type of Hospitalization/Surgery angioplasty stent- 2019 dr.Clark carotid stents Family and Social History Cancer: Yes - Siblings; Kidney Disease: Yes - Father; Former smoker - quit 1985; Marital Status - Married; Alcohol Use: Never; Drug Use: No History; Caffeine Use: Daily; Financial Concerns: No; Food, Clothing or Shelter Needs: No; Support System  Lacking: No; Transportation Concerns: No Electronic Signature(s) Signed: 01/26/2022 4:36:36 PM By: Kalman Shan DO Signed: 01/26/2022 5:13:32 PM By: Deon Pilling RN, BSN Entered By: Kalman Shan on 01/26/2022 15:16:13 -------------------------------------------------------------------------------- SuperBill Details Patient Name: Date of Service: [name] 01/26/2022 Medical Record Number: [id number] Patient Account Number: [id number] Date of Birth/Sex: Treating RN: 01-08-1934 (88 y.o. [name] Primary Care Provider: [name] Other Clinician: Referring Provider: Treating Provider/Extender: [name] in Treatment: 17 Diagnosis Coding ICD-10 Codes Code Description S80.11XD Contusion of right lower leg, subsequent encounter L97.818 Non-pressure chronic ulcer of other part of right lower leg with other specified severity I87.311 Chronic venous hypertension (idiopathic) with ulcer of right lower extremity  I70.238 Atherosclerosis of native arteries of right leg with ulceration of other part of lower leg Facility Procedures CPT4 Code: 64314276 Description: 70110 - WOUND CARE VISIT-LEV 3 EST PT Modifier: Quantity: 1 Physician Procedures : CPT4 Code Description Modifier [id number] - WC PHYS LEVEL 3 - EST PT ICD-10 Diagnosis Description S80.11XD Contusion of right lower leg, subsequent encounter 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

## 2022-01-26 NOTE — Progress Notes (Signed)
[name] ([id number]) Visit Report for 01/26/2022 Arrival Information Details Patient Name: Date of Service: [name] 01/26/2022 2:15 PM Medical Record Number: [id number] Patient Account Number: [id number] Date of Birth/Sex: Treating RN: 1933/12/23 (88 y.o. [name], [name] Primary Care Tayquan Gassman: [name] Other Clinician: Referring Devondre Guzzetta: Treating Clearence Vitug/Extender: [name] in Treatment: 37 Visit Information History Since Last Visit Added or deleted any medications: No Patient Arrived: Cane Any new allergies or adverse reactions: No Arrival Time: 14:19 Had a fall or experienced change in No Accompanied By: daughter activities of daily living that may affect Transfer Assistance: None risk of falls: Patient Identification Verified: Yes Signs or symptoms of abuse/neglect since last visito No Secondary Verification Process Completed: Yes Hospitalized since last visit: No Patient Requires Transmission-Based Precautions: No Implantable device outside of the clinic excluding No Patient Has Alerts: Yes cellular tissue based products placed in the center Patient Alerts: [id number] to obtain;pain since last visit: Has Dressing in Place as Prescribed: Yes Pain Present Now: No Electronic Signature(s) Signed: 01/26/2022 5:13:32 PM By: Deon Pilling RN, BSN Entered By: Deon Pilling on 01/26/2022 14:19:52 -------------------------------------------------------------------------------- Clinic Level of Care Assessment Details Patient Name: Date of Service: [name]. 01/26/2022 2:15 PM Medical Record Number: [id number] Patient Account Number: [id number] Date of Birth/Sex: Treating RN: 1933-09-10 (88 y.o. [name], [name] Primary Care [name]: [name] Other Clinician: Referring [name]: Treating [name]/Extender: [name] in Treatment: 17 Clinic Level of Care Assessment Items TOOL 4 Quantity  Score X- 1 0 Use when only an EandM is performed on FOLLOW-UP visit ASSESSMENTS - Nursing Assessment / Reassessment X- 1 10 Reassessment of Co-morbidities (includes updates in patient status) X- 1 5 Reassessment of Adherence to Treatment Plan ASSESSMENTS - Wound and Skin A ssessment / Reassessment X - Simple Wound Assessment / Reassessment - one wound 1 5 '[]'$  - 0 Complex Wound Assessment / Reassessment - multiple wounds X- 1 10 Dermatologic / Skin Assessment (not related to wound area) ASSESSMENTS - Focused Assessment X- 1 5 Circumferential Edema Measurements - multi extremities '[]'$  - 0 Nutritional Assessment / Counseling / Intervention '[]'$  - 0 Lower Extremity Assessment (monofilament, tuning fork, pulses) '[]'$  - 0 Peripheral Arterial Disease Assessment (using hand held doppler) ASSESSMENTS - Ostomy and/or Continence Assessment and Care '[]'$  - 0 Incontinence Assessment and Management '[]'$  - 0 Ostomy Care Assessment and Management (repouching, etc.) PROCESS - Coordination of Care X - Simple Patient / Family Education for ongoing care 1 15 '[]'$  - 0 Complex (extensive) Patient / Family Education for ongoing care X- 1 10 Staff obtains Consents, Records, T Results / Process Orders est '[]'$  - 0 Staff telephones HHA, Nursing Homes / Clarify orders / etc '[]'$  - 0 Routine Transfer to another Facility (non-emergent condition) '[]'$  - 0 Routine Hospital Admission (non-emergent condition) '[]'$  - 0 New Admissions / Biomedical engineer / Ordering NPWT Apligraf, etc. , '[]'$  - 0 Emergency Hospital Admission (emergent condition) X- 1 10 Simple Discharge Coordination '[]'$  - 0 Complex (extensive) Discharge Coordination PROCESS - Special Needs '[]'$  - 0 Pediatric / Minor Patient Management '[]'$  - 0 Isolation Patient Management '[]'$  - 0 Hearing / Language / Visual special needs '[]'$  - 0 Assessment of Community assistance (transportation, D/C planning, etc.) '[]'$  - 0 Additional assistance / Altered  mentation '[]'$  - 0 Support Surface(s) Assessment (bed, cushion, seat, etc.) INTERVENTIONS - Wound Cleansing / Measurement X - Simple Wound Cleansing - one wound 1 5 '[]'$  - 0 Complex Wound Cleansing -  multiple wounds X- 1 5 Wound Imaging (photographs - any number of wounds) '[]'$  - 0 Wound Tracing (instead of photographs) X- 1 5 Simple Wound Measurement - one wound '[]'$  - 0 Complex Wound Measurement - multiple wounds INTERVENTIONS - Wound Dressings X - Small Wound Dressing one or multiple wounds 1 10 '[]'$  - 0 Medium Wound Dressing one or multiple wounds '[]'$  - 0 Large Wound Dressing one or multiple wounds '[]'$  - 0 Application of Medications - topical '[]'$  - 0 Application of Medications - injection INTERVENTIONS - Miscellaneous '[]'$  - 0 External ear exam '[]'$  - 0 Specimen Collection (cultures, biopsies, blood, body fluids, etc.) '[]'$  - 0 Specimen(s) / Culture(s) sent or taken to Lab for analysis '[]'$  - 0 Patient Transfer (multiple staff / Civil Service fast streamer / Similar devices) '[]'$  - 0 Simple Staple / Suture removal (25 or less) '[]'$  - 0 Complex Staple / Suture removal (26 or more) '[]'$  - 0 Hypo / Hyperglycemic Management (close monitor of Blood Glucose) '[]'$  - 0 Ankle / Brachial Index (ABI) - do not check if billed separately X- 1 5 Vital Signs Has the patient been seen at the hospital within the last three years: Yes Total Score: 100 Level Of Care: New/Established - Level 3 Electronic Signature(s) Signed: 01/26/2022 5:13:32 PM By: Deon Pilling RN, BSN Entered By: Deon Pilling on 01/26/2022 15:02:30 -------------------------------------------------------------------------------- Encounter Discharge Information Details Patient Name: Date of Service: [name]. 01/26/2022 2:15 PM Medical Record Number: [id number] Patient Account Number: [id number] Date of Birth/Sex: Treating RN: 1934/05/07 (88 y.o. [name] Primary Care Lyann Hagstrom: [name] Other Clinician: Referring Donshay Lupinski: Treating  Kaylei Frink/Extender: [name] in Treatment: 17 Encounter Discharge Information Items Discharge Condition: Stable Ambulatory Status: Cane Discharge Destination: Home Transportation: Private Auto Accompanied By: daughter Schedule Follow-up Appointment: Yes Clinical Summary of Care: Electronic Signature(s) Signed: 01/26/2022 5:13:32 PM By: Deon Pilling RN, BSN Entered By: Deon Pilling on 01/26/2022 15:02:53 -------------------------------------------------------------------------------- Lower Extremity Assessment Details Patient Name: Date of Service: [name]. 01/26/2022 2:15 PM Medical Record Number: [id number] Patient Account Number: [id number] Date of Birth/Sex: Treating RN: 1933-09-29 (88 y.o. [name] Primary Care Mikeria Valin: [name] Other Clinician: Referring Shakari Qazi: Treating Abdulmalik Darco/Extender: [name] in Treatment: 17 Edema Assessment Assessed: [name]: No] [name]: Yes] Edema: [Left: Ye] [Right: s] Calf Left: Right: Point of Measurement: 31 cm From Medial Instep 31.5 cm Ankle Left: Right: Point of Measurement: 7 cm From Medial Instep 26 cm Vascular Assessment Pulses: Dorsalis Pedis Palpable: [Right:Yes] Electronic Signature(s) Signed: 01/26/2022 5:13:32 PM By: Deon Pilling RN, BSN Entered By: Deon Pilling on 01/26/2022 14:22:25 -------------------------------------------------------------------------------- Multi Wound Chart Details Patient Name: Date of Service: [name]. 01/26/2022 2:15 PM Medical Record Number: [id number] Patient Account Number: [id number] Date of Birth/Sex: Treating RN: November 12, 1933 (87 y.o. [name] Primary Care Special Ranes: [name] Other Clinician: Referring Johnye Kist: Treating Thomes Burak/Extender: [name] in Treatment: 17 Vital Signs Height(in): 68 Pulse(bpm): 84 Weight(lbs): 168 Blood Pressure(mmHg): 116/64 Body  Mass Index(BMI): 24.8 Temperature(F): 98.1 Respiratory Rate(breaths/min): 20 Photos: [N/A:N/A] Right, Anterior Lower Leg N/A N/A Wound Location: Trauma N/A N/A Wounding Event: Abrasion N/A N/A Primary Etiology: Diabetic Wound/Ulcer of the Lower N/A N/A Secondary Etiology: Extremity Cataracts, Anemia, Asthma, Chronic N/A N/A Comorbid History: Obstructive Pulmonary Disease (COPD), Congestive Heart Failure, Coronary Artery Disease, Hypertension, Myocardial Infarction, Peripheral Arterial Disease, Type II Diabetes 07/02/2021 N/A N/A Date Acquired: 17 N/A N/A Weeks of Treatment: Open N/A N/A Wound Status: No N/A N/A  Wound Recurrence: Yes N/A N/A Clustered Wound: 2 N/A N/A Clustered Quantity: 1.3x0.3x0.2 N/A N/A Measurements L x W x D (cm) 0.306 N/A N/A A (cm) : rea 0.061 N/A N/A Volume (cm) : 91.50% N/A N/A % Reduction in Area: 95.80% N/A N/A % Reduction in Volume: Full Thickness Without Exposed N/A N/A Classification: Support Structures Medium N/A N/A Exudate Amount: Serosanguineous N/A N/A Exudate Type: red, brown N/A N/A Exudate Color: Well defined, not attached N/A N/A Wound Margin: Large (67-100%) N/A N/A Granulation Amount: Red, Pink N/A N/A Granulation Quality: Small (1-33%) N/A N/A Necrotic Amount: Fat Layer (Subcutaneous Tissue): Yes N/A N/A Exposed Structures: Fascia: No Tendon: No Muscle: No Joint: No Bone: No Large (67-100%) N/A N/A Epithelialization: Treatment Notes Wound #1 (Lower Leg) Wound Laterality: Right, Anterior Cleanser Soap and Water Discharge Instruction: May shower and wash wound with dial antibacterial soap and water prior to dressing change. Wound Cleanser Discharge Instruction: Cleanse the wound with wound cleanser prior to applying a clean dressing using gauze sponges, not tissue or cotton balls. Peri-Wound Care Skin Prep Discharge Instruction: Use skin prep as directed Sween Lotion (Moisturizing  lotion) Discharge Instruction: Apply moisturizing lotion as directed Topical Primary Dressing Fibracol Plus Dressing, 4x4.38 in (collagen) Discharge Instruction: Moisten collagen with saline or hydrogel Secondary Dressing Zetuvit Plus Silicone Border Dressing 4x4 (in/in) Discharge Instruction: Apply silicone border over primary dressing as directed. Secured With Compression Wrap Compression Stockings Environmental education officer) Signed: 01/26/2022 4:36:36 PM By: Kalman Shan DO Signed: 01/26/2022 5:13:32 PM By: Deon Pilling RN, BSN Entered By: Kalman Shan on 01/26/2022 15:15:08 -------------------------------------------------------------------------------- Multi-Disciplinary Care Plan Details Patient Name: Date of Service: [name] 01/26/2022 2:15 PM Medical Record Number: [id number] Patient Account Number: [id number] Date of Birth/Sex: Treating RN: 08-31-1933 (88 y.o. [name] Primary Care Axelle Szwed: [name] Other Clinician: Referring Voncile Schwarz: Treating Willmar Stockinger/Extender: [name] in Treatment: 17 Active Inactive Tissue Oxygenation Nursing Diagnoses: Potential alteration in peripheral tissue perfusion (select prior to confirmation of diagnosis) Goals: Non-invasive arterial studies are completed as ordered Date Initiated: 10/14/2021 Target Resolution Date: 02/20/2022 Goal Status: Active Interventions: Assess patient understanding of disease process and management upon diagnosis and as needed Assess peripheral arterial status upon admission and as needed Provide education on tissue oxygenation and ischemia Treatment Activities: Ankle Brachial Index (ABI) : 09/30/2021 Non-invasive vascular studies : 10/14/2021 Notes: Wound/Skin Impairment Nursing Diagnoses: Knowledge deficit related to ulceration/compromised skin integrity Goals: Patient/caregiver will verbalize understanding of skin care regimen Date Initiated:  09/26/2021 Target Resolution Date: 02/20/2022 Goal Status: Active Interventions: Assess patient/caregiver ability to perform ulcer/skin care regimen upon admission and as needed Assess ulceration(s) every visit Provide education on ulcer and skin care Treatment Activities: Skin care regimen initiated : 09/26/2021 Topical wound management initiated : 09/26/2021 Notes: 10/21/21: Wound care regimen continues, using [employer] and [employer]. Electronic Signature(s) Signed: 01/26/2022 5:13:32 PM By: Deon Pilling RN, BSN Entered By: Deon Pilling on 01/26/2022 14:25:41 -------------------------------------------------------------------------------- Pain Assessment Details Patient Name: Date of Service: Rosko, Teion W. 01/26/2022 2:15 PM Medical Record Number: [id number] Patient Account Number: [id number] Date of Birth/Sex: Treating RN: 1934/02/15 (88 y.o. [name] Primary Care Arek Spadafore: [name] Other Clinician: Referring Avrey Hyser: Treating Brennah Quraishi/Extender: [name] in Treatment: 17 Active Problems Location of Pain Severity and Description of Pain Patient Has Paino No Site Locations Rate the pain. Rate the pain. Current Pain Level: 0 Pain Management and Medication Current Pain Management: Medication: No Cold Application: No Rest: No Massage: No Activity:  No T.E.N.S.: No Heat Application: No Leg drop or elevation: No Is the Current Pain Management Adequate: Adequate How does your wound impact your activities of daily livingo Sleep: No Bathing: No Appetite: No Relationship With Others: No Bladder Continence: No Emotions: No Bowel Continence: No Work: No Toileting: No Drive: No Dressing: No Hobbies: No Engineer, maintenance) Signed: 01/26/2022 5:13:32 PM By: Deon Pilling RN, BSN Entered By: Deon Pilling on 01/26/2022  14:21:25 -------------------------------------------------------------------------------- Patient/Caregiver Education Details Patient Name: Date of Service: [name] 8/28/2023andnbsp2:15 PM Medical Record Number: [id number] Patient Account Number: [id number] Date of Birth/Gender: Treating RN: 1933/08/31 (88 y.o. [name] Primary Care Physician: [name] Other Clinician: Referring Physician: Treating Physician/Extender: [name] in Treatment: 51 Education Assessment Education Provided To: Patient Education Topics Provided Wound/Skin Impairment: Handouts: Skin Care Do's and Dont's Methods: Explain/Verbal Responses: Reinforcements needed Electronic Signature(s) Signed: 01/26/2022 5:13:32 PM By: Deon Pilling RN, BSN Entered By: Deon Pilling on 01/26/2022 14:25:51 -------------------------------------------------------------------------------- Wound Assessment Details Patient Name: Date of Service: [name]. 01/26/2022 2:15 PM Medical Record Number: [id number] Patient Account Number: [id number] Date of Birth/Sex: Treating RN: [birth year] (88 y.o. [name], Meta.Reding Primary Care Clotile Whittington: [name] Other Clinician: Referring Julez Huseby: Treating Gayla Benn/Extender: [name] in Treatment: 17 Wound Status Wound Number: 1 Primary Abrasion Etiology: Wound Location: Right, Anterior Lower Leg Secondary Diabetic Wound/Ulcer of the Lower Extremity Wounding Event: Trauma Etiology: Date Acquired: 07/02/2021 Wound Open Weeks Of Treatment: 17 Status: Clustered Wound: Yes Comorbid Cataracts, Anemia, Asthma, Chronic Obstructive Pulmonary History: Disease (COPD), Congestive Heart Failure, Coronary Artery Disease, Hypertension, Myocardial Infarction, Peripheral Arterial Disease, Type II Diabetes Photos Wound Measurements Length: (cm) 1.3 Width: (cm) 0.3 Depth: (cm) 0.2 Clustered Quantity: 2 Area:  (cm) 0.306 Volume: (cm) 0.061 % Reduction in Area: 91.5% % Reduction in Volume: 95.8% Epithelialization: Large (67-100%) Tunneling: No Undermining: No Wound Description Classification: Full Thickness Without Exposed Support Structures Wound Margin: Well defined, not attached Exudate Amount: Medium Exudate Type: Serosanguineous Exudate Color: red, brown Foul Odor After Cleansing: No Slough/Fibrino Yes Wound Bed Granulation Amount: Large (67-100%) Exposed Structure Granulation Quality: Red, Pink Fascia Exposed: No Necrotic Amount: Small (1-33%) Fat Layer (Subcutaneous Tissue) Exposed: Yes Necrotic Quality: Adherent Slough Tendon Exposed: No Muscle Exposed: No Joint Exposed: No Bone Exposed: No Treatment Notes Wound #1 (Lower Leg) Wound Laterality: Right, Anterior Cleanser Soap and Water Discharge Instruction: May shower and wash wound with dial antibacterial soap and water prior to dressing change. Wound Cleanser Discharge Instruction: Cleanse the wound with wound cleanser prior to applying a clean dressing using gauze sponges, not tissue or cotton balls. Peri-Wound Care Skin Prep Discharge Instruction: Use skin prep as directed Sween Lotion (Moisturizing lotion) Discharge Instruction: Apply moisturizing lotion as directed Topical Primary Dressing Fibracol Plus Dressing, 4x4.38 in (collagen) Discharge Instruction: Moisten collagen with saline or hydrogel Secondary Dressing Zetuvit Plus Silicone Border Dressing 4x4 (in/in) Discharge Instruction: Apply silicone border over primary dressing as directed. Secured With Compression Wrap Compression Stockings Environmental education officer) Signed: 01/26/2022 4:41:46 PM By: Erenest Blank Signed: 01/26/2022 5:13:32 PM By: Deon Pilling RN, BSN Entered By: Erenest Blank on 01/26/2022 14:25:22 -------------------------------------------------------------------------------- Vitals Details Patient Name: Date of  Service: [name] 01/26/2022 2:15 PM Medical Record Number: [id number] Patient Account Number: [id number] Date of Birth/Sex: Treating RN: 1933/07/26 (88 y.o. [name] Primary Care Jasmarie Coppock: [name] Other Clinician: Referring Jeno Calleros: Treating Patrcia Schnepp/Extender: [name] in Treatment: 17 Vital Signs Time Taken: 14:21 Temperature (F):  98.1 Height (in): 69 Pulse (bpm): 55 Weight (lbs): 168 Respiratory Rate (breaths/min): 20 Body Mass Index (BMI): 24.8 Blood Pressure (mmHg): 116/64 Reference Range: 80 - 120 mg / dl Electronic Signature(s) Signed: 01/26/2022 5:13:32 PM By: Deon Pilling RN, BSN Entered By: Deon Pilling on 01/26/2022 14:21:16

## 2022-01-27 ENCOUNTER — Ambulatory Visit (HOSPITAL_COMMUNITY)
Admission: RE | Admit: 2022-01-27 | Discharge: 2022-01-27 | Disposition: A | Discharge status: Home / Self Care | Payer: Medicare Other | Source: Ambulatory Visit | Attending: Cardiovascular Disease | Admitting: Cardiovascular Disease

## 2022-01-27 DIAGNOSIS — R443 Hallucinations, unspecified: Secondary | ICD-10-CM | POA: Diagnosis not present

## 2022-01-27 DIAGNOSIS — Z9889 Other specified postprocedural states: Secondary | ICD-10-CM | POA: Insufficient documentation

## 2022-01-30 ENCOUNTER — Telehealth: Payer: Self-pay

## 2022-01-30 NOTE — Telephone Encounter (Addendum)
Called patient regarding results. Left message for patient to call office. ----- Message from [name], NP sent at 01/29/2022  1:23 PM EDT ----- I have reviewed his carotid artery ultrasound report. No significant blockages which would lead to  memory loss.  KL

## 2022-02-03 ENCOUNTER — Telehealth: Payer: Self-pay | Admitting: Cardiology

## 2022-02-03 DIAGNOSIS — H6122 Impacted cerumen, left ear: Secondary | ICD-10-CM | POA: Diagnosis not present

## 2022-02-03 DIAGNOSIS — C44202 Unspecified malignant neoplasm of skin of right ear and external auricular canal: Secondary | ICD-10-CM | POA: Diagnosis not present

## 2022-02-03 DIAGNOSIS — Z9622 Myringotomy tube(s) status: Secondary | ICD-10-CM | POA: Diagnosis not present

## 2022-02-03 NOTE — Telephone Encounter (Signed)
Daughter given carotid US results per K. Lawrence.Marland KitchenMarland KitchenI have reviewed his carotid artery ultrasound report. No significant blockages which would lead to memory loss. Daughter had no questions or concerns at this time.

## 2022-02-03 NOTE — Telephone Encounter (Signed)
Pt's daughter returning call regarding results. Please advise

## 2022-02-12 ENCOUNTER — Telehealth: Payer: Self-pay

## 2022-02-12 ENCOUNTER — Encounter (HOSPITAL_BASED_OUTPATIENT_CLINIC_OR_DEPARTMENT_OTHER): Payer: Medicare Other | Attending: Internal Medicine | Admitting: Internal Medicine

## 2022-02-12 ENCOUNTER — Telehealth: Payer: Self-pay | Admitting: Cardiology

## 2022-02-12 DIAGNOSIS — S81801A Unspecified open wound, right lower leg, initial encounter: Secondary | ICD-10-CM | POA: Diagnosis not present

## 2022-02-12 NOTE — Telephone Encounter (Signed)
Spouse returning call regarding results. Please advise

## 2022-02-12 NOTE — Telephone Encounter (Signed)
Daughter given carotid artery ultrasound report per K. Lawrence. No significant blockages which would lead to  memory loss. Daughter stated patient has dementia. She had no questions or concerns at this time.

## 2022-02-12 NOTE — Telephone Encounter (Addendum)
Called patient regarding results. Left message for patient to call office. Letter mailed out 02/12/22----- Message from [name], NP sent at 01/29/2022  1:23 PM EDT ----- I have reviewed his carotid artery ultrasound report. No significant blockages which would lead to  memory loss.  KL

## 2022-02-13 DIAGNOSIS — S81801A Unspecified open wound, right lower leg, initial encounter: Secondary | ICD-10-CM | POA: Diagnosis not present

## 2022-02-13 NOTE — Progress Notes (Signed)
[name], [name] ([id number]) Visit Report for 02/12/2022 Arrival Information Details Patient Name: Date of Service: CASPAR, FAVILA 02/12/2022 1:30 PM Medical Record Number: [id number] Patient Account Number: [id number] Date of Birth/Sex: Treating RN: 05-30-1934 (88 y.o. [name], Meta.Reding Primary Care [name]: [name] Other Clinician: Referring Ruffin Lada: Treating Margrete Delude/Extender: [name] in Treatment: 6 Visit Information History Since Last Visit Added or deleted any medications: No Patient Arrived: [name] Any new allergies or adverse reactions: No Arrival Time: 13:41 Had a fall or experienced change in No Accompanied By: daughter activities of daily living that may affect Transfer Assistance: None risk of falls: Patient Identification Verified: Yes Signs or symptoms of abuse/neglect since last visito No Secondary Verification Process Completed: Yes Hospitalized since last visit: No Patient Requires Transmission-Based Precautions: No Implantable device outside of the clinic excluding No Patient Has Alerts: Yes cellular tissue based products placed in the center Patient Alerts: [id number]:[id number] to obtain;pain since last visit: Has Dressing in Place as Prescribed: Yes Pain Present Now: No Notes per patient and daughter patient's wound was healed and hit leg on a walker outside 4-5 days ago. Electronic Signature(s) Signed: 02/13/2022 5:40:20 PM By: Deon Pilling RN, BSN Entered By: Deon Pilling on 02/12/2022 13:42:31 -------------------------------------------------------------------------------- Encounter Discharge Information Details Patient Name: Date of Service: [name]. 02/12/2022 1:30 PM Medical Record Number: [id number] Patient Account Number: [id number] Date of Birth/Sex: Treating RN: 04/09/1934 (88 y.o. [name] Primary Care Najmo Pardue: [name] Other Clinician: Referring Ansley Mangiapane: Treating [name]/Extender: [name] in Treatment: 19 Encounter Discharge Information Items Discharge Condition: Stable Ambulatory Status: Cane Discharge Destination: Home Transportation: Private Auto Accompanied By: daughter Schedule Follow-up Appointment: Yes Clinical Summary of Care: Electronic Signature(s) Signed: 02/13/2022 5:40:20 PM By: Deon Pilling RN, BSN Entered By: Deon Pilling on 02/12/2022 14:12:25 -------------------------------------------------------------------------------- Lower Extremity Assessment Details Patient Name: Date of Service: [name]. 02/12/2022 1:30 PM Medical Record Number: [id number] Patient Account Number: [id number] Date of Birth/Sex: Treating RN: 10/21/1933 (88 y.o. [name] Primary Care [name]: [name] Other Clinician: Referring Kiernan Atkerson: Treating Galina Haddox/Extender: [name] in Treatment: 19 Edema Assessment Assessed: [name]: No] [name]: Yes] Edema: [Left: Ye] [Right: s] Calf Left: Right: Point of Measurement: 31 cm From Medial Instep 32 cm Ankle Left: Right: Point of Measurement: 7 cm From Medial Instep 26 cm Knee To Floor Left: Right: From Medial Instep 43 cm Vascular Assessment Pulses: Dorsalis Pedis Palpable: [Right:Yes] Electronic Signature(s) Signed: 02/13/2022 5:40:20 PM By: Deon Pilling RN, BSN Entered By: Deon Pilling on 02/12/2022 14:11:46 -------------------------------------------------------------------------------- Multi Wound Chart Details Patient Name: Date of Service: [name]. 02/12/2022 1:30 PM Medical Record Number: [id number] Patient Account Number: [id number] Date of Birth/Sex: Treating RN: [birth year] (88 y.o. 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] [N/A:N/A] Secondary Etiology: [1:Extremity Cataracts, Anemia, Asthma, Chronic N/A] Comorbid History: [1:Obstructive Pulmonary Disease (COPD), Congestive Heart Failure, Coronary Artery Disease, Hypertension, Myocardial Infarction, Peripheral Arterial Disease, Type II Diabetes 07/02/2021] [N/A:N/A] Date Acquired: [1:19] [N/A:N/A] Weeks of Treatment: [1:Open] [N/A:N/A] Wound Status: [1:No] [N/A:N/A] Wound Recurrence: [1:Yes] [N/A:N/A] Clustered Wound: [1:2] [N/A:N/A] Clustered Quantity: [1:5x4.3x0.1] [N/A:N/A] Measurements L x W x D (cm) [1:16.886] [  N/A:N/A] A (cm) : rea [1:1.689] [N/A:N/A] Volume (cm) : [1:-368.40%] [N/A:N/A] % Reduction in Area: [1:-17.10%] [N/A:N/A] % Reduction in Volume: [1:Full Thickness Without Exposed] [N/A:N/A] Classification: [1:Support Structures Small] [N/A:N/A] Exudate Amount: [1:Serosanguineous] [N/A:N/A] Exudate Type: [1:red, brown] [N/A:N/A] Exudate Color: [1:Well defined, not attached] [N/A:N/A] Wound Margin: [1:None Present (0%)] [N/A:N/A] Granulation Amount: [1:None Present (0%)] [N/A:N/A] Necrotic Amount: [1:Fat Layer (Subcutaneous Tissue): Yes N/A] Exposed Structures: [1:Fascia: No Tendon: No Muscle: No Joint: No Bone: No Large (67-100%)] [N/A:N/A] Treatment Notes Wound #1 (Lower Leg) Wound Laterality: Right, Anterior Cleanser Soap and Water Discharge Instruction: May shower and wash wound with dial antibacterial soap and water prior to dressing change. Wound Cleanser Discharge Instruction: Cleanse the wound with wound cleanser prior to applying a clean dressing using gauze sponges, not tissue or cotton balls. Peri-Wound Care Skin Prep Discharge Instruction: Use skin prep  as directed Sween Lotion (Moisturizing lotion) Discharge Instruction: Apply moisturizing lotion as directed Topical Primary Dressing Fibracol Plus Dressing, 4x4.38 in (collagen) Discharge Instruction: Moisten collagen with saline or hydrogel Secondary Dressing Zetuvit Plus Silicone Border Dressing 4x4 (in/in) Discharge Instruction: Apply silicone border over primary dressing as directed. Secured With Compression Wrap Compression Stockings Environmental education officer) Signed: 02/12/2022 4:35:07 PM By: Linton Ham MD Entered By: Linton Ham on 02/12/2022 14:41:53 -------------------------------------------------------------------------------- Multi-Disciplinary Care Plan Details Patient Name: Date of Service: [name]. 02/12/2022 1:30 PM Medical Record Number: [id number] Patient Account Number: [id number] Date of Birth/Sex: Treating RN: 04/29/1934 (88 y.o. [name] Primary Care Kanyia Heaslip: [name] Other Clinician: Referring Charma Mocarski: Treating Jaxtin Raimondo/Extender: [name] in Treatment: 19 Active Inactive Tissue Oxygenation Nursing Diagnoses: Potential alteration in peripheral tissue perfusion (select prior to confirmation of diagnosis) Goals: Non-invasive arterial studies are completed as ordered Date Initiated: 10/14/2021 Target Resolution Date: 02/20/2022 Goal Status: Active Interventions: Assess patient understanding of disease process and management upon diagnosis and as needed Assess peripheral arterial status upon admission and as needed Provide education on tissue oxygenation and ischemia Treatment Activities: Ankle Brachial Index (ABI) : 09/30/2021 Non-invasive vascular studies : 10/14/2021 Notes: Wound/Skin Impairment Nursing Diagnoses: Knowledge deficit related to ulceration/compromised skin integrity Goals: Patient/caregiver will verbalize understanding of skin care regimen Date Initiated: 09/26/2021 Target  Resolution Date: 02/20/2022 Goal Status: Active Interventions: Assess patient/caregiver ability to perform ulcer/skin care regimen upon admission and as needed Assess ulceration(s) every visit Provide education on ulcer and skin care Treatment Activities: Skin care regimen initiated : 09/26/2021 Topical wound management initiated : 09/26/2021 Notes: 10/21/21: Wound care regimen continues, using [employer] and [employer]. Electronic Signature(s) Signed: 02/13/2022 5:40:20 PM By: Deon Pilling RN, BSN Entered By: Deon Pilling on 02/12/2022 14:07:33 -------------------------------------------------------------------------------- Pain Assessment Details Patient Name: Date of Service: [name]. 02/12/2022 1:30 PM Medical Record Number: [id number] Patient Account Number: [id number] Date of Birth/Sex: Treating RN: August 19, 1933 (88 y.o. [name] Primary Care Elvis Boot: [name] Other Clinician: Referring Anjani Feuerborn: Treating Landen Knoedler/Extender: [name] in Treatment: 19 Active Problems Location of Pain Severity and Description of Pain Patient Has Paino No Site Locations Rate the pain. Current Pain Level: 0 Pain Management and Medication Current Pain Management: Medication: No Cold Application: No Rest: No Massage: No Activity: No T.E.N.S.: No Heat Application: No Leg drop or elevation: No Is the Current Pain Management Adequate: Adequate How does your wound impact your activities of daily livingo Sleep: No Bathing: No Appetite: No Relationship With Others: No Bladder Continence: No Emotions: No Bowel Continence: No Work: No Toileting: No Drive: No Dressing:  No Hobbies: No Electronic Signature(s) Signed: 02/13/2022 5:40:20 PM By: Deon Pilling RN, BSN Entered By: Deon Pilling on 02/12/2022 13:42:58 -------------------------------------------------------------------------------- Patient/Caregiver Education Details Patient  Name: Date of Service: Adderly, Semisi W. 9/14/2023andnbsp1:30 PM Medical Record Number: [id number] Patient Account Number: [id number] Date of Birth/Gender: Treating RN: June 07, 1933 (88 y.o. [name] Primary Care Physician: [name] Other Clinician: Referring Physician: Treating Physician/Extender: [name] in Treatment: 53 Education Assessment Education Provided To: Patient Education Topics Provided Wound/Skin Impairment: Handouts: Skin Care Do's and Dont's Methods: Explain/Verbal Responses: Reinforcements needed Electronic Signature(s) Signed: 02/13/2022 5:40:20 PM By: Deon Pilling RN, BSN Entered By: Deon Pilling on 02/12/2022 14:07:47 -------------------------------------------------------------------------------- Wound Assessment Details Patient Name: Date of Service: [name]. 02/12/2022 1:30 PM Medical Record Number: [id number] Patient Account Number: [id number] Date of Birth/Sex: Treating RN: 06/12/1933 (88 y.o. [name], Meta.Reding Primary Care Samnang Shugars: [name] Other Clinician: Referring Rochell Mabie: Treating Makeisha Jentsch/Extender: [name] in Treatment: 19 Wound Status Wound Number: 1 Primary Abrasion Etiology: Wound Location: Right, Anterior Lower Leg Secondary Diabetic Wound/Ulcer of the Lower Extremity Wounding Event: Trauma Etiology: Date Acquired: 07/02/2021 Wound Open Weeks Of Treatment: 19 Status: Clustered Wound: Yes Comorbid Cataracts, Anemia, Asthma, Chronic Obstructive Pulmonary History: Disease (COPD), Congestive Heart Failure, Coronary Artery Disease, Hypertension, Myocardial Infarction, Peripheral Arterial Disease, Type II Diabetes Photos Wound Measurements Length: (cm) 5 Width: (cm) 4.3 Depth: (cm) 0.1 Clustered Quantity: 2 Area: (cm) 16.886 Volume: (cm) 1.689 % Reduction in Area: -368.4% % Reduction in Volume: -17.1% Epithelialization: Large (67-100%) Tunneling:  No Undermining: No Wound Description Classification: Full Thickness Without Exposed Support Structures Wound Margin: Well defined, not attached Exudate Amount: Small Exudate Type: Serosanguineous Exudate Color: red, brown Wound Bed Granulation Amount: None Present (0%) Necrotic Amount: None Present (0%) Foul Odor After Cleansing: No Slough/Fibrino Yes Exposed Structure Fascia Exposed: No Fat Layer (Subcutaneous Tissue) Exposed: Yes Tendon Exposed: No Muscle Exposed: No Joint Exposed: No Bone Exposed: No Treatment Notes Wound #1 (Lower Leg) Wound Laterality: Right, Anterior Cleanser Soap and Water Discharge Instruction: May shower and wash wound with dial antibacterial soap and water prior to dressing change. Wound Cleanser Discharge Instruction: Cleanse the wound with wound cleanser prior to applying a clean dressing using gauze sponges, not tissue or cotton balls. Peri-Wound Care Skin Prep Discharge Instruction: Use skin prep as directed Sween Lotion (Moisturizing lotion) Discharge Instruction: Apply moisturizing lotion as directed Topical Primary Dressing Fibracol Plus Dressing, 4x4.38 in (collagen) Discharge Instruction: Moisten collagen with saline or hydrogel Secondary Dressing Zetuvit Plus Silicone Border Dressing 4x4 (in/in) Discharge Instruction: Apply silicone border over primary dressing as directed. Secured With Compression Wrap Compression Stockings Environmental education officer) Signed: 02/12/2022 4:08:15 PM By: Rhae Hammock RN Signed: 02/13/2022 5:40:20 PM By: Deon Pilling RN, BSN Entered By: Rhae Hammock on 02/12/2022 13:47:49 -------------------------------------------------------------------------------- Vitals Details Patient Name: Date of Service: [name]. 02/12/2022 1:30 PM Medical Record Number: [id number] Patient Account Number: [id number] Date of Birth/Sex: Treating RN: 25-Jun-1933 (88 y.o. [name], [name] Primary Care  Avaya Mcjunkins: [name] Other Clinician: Referring Ahmaad Neidhardt: Treating Rain Wilhide/Extender: [name] in Treatment: 19 Vital Signs Time Taken: 13:40 Temperature (F): 97.9 Height (in): 69 Pulse (bpm): 53 Weight (lbs): 168 Respiratory Rate (breaths/min): 20 Body Mass Index (BMI): 24.8 Blood Pressure (mmHg): 122/57 Reference Range: 80 - 120 mg / dl Electronic Signature(s) Signed: 02/13/2022 5:40:20 PM By: Deon Pilling RN, BSN Entered By: Deon Pilling on 02/12/2022 13:42:48

## 2022-02-13 NOTE — Progress Notes (Signed)
KAZMIR, OKI ([id number]) Visit Report for 02/12/2022 HPI Details Patient Name: Date of Service: [name], [name] 02/12/2022 1:30 PM Medical Record Number: [id number] Patient Account Number: [id number] Date of Birth/Sex: Treating RN: Jan 10, 1934 (88 y.o. M) Primary Care Provider: [name] Other Clinician: Referring Provider: Treating Provider/Extender: [name] in Treatment: 16 History of Present Illness HPI Description: ADMISSION 09/26/21 This is a 88 year old man who lives near [place]. He is accompanied by his daughter who lives with him. His wife is also at home. Apparently 2 months ago he traumatized the right anterior lower leg x2 in quick succession including once on the back of a pickup truck or car. By description it sounds as though he had a black eschar on this which fell off about a month ago. He has been left with an open wound. It this is not that large but is completely slough covered and has undermining of at least a centimeter and half from 12-6 o'clock. He received a course of antibiotics earlier this month from his primary care doctor Ceftin. He is not on antibiotics currently. The patient has a complicated relevant history. 2019 he underwent bilateral common iliac artery stenting and angioplasties by Dr. Carlis Abbott. He had ABIs at the same time which showed a ABI at 0.55 on the right and a TBI of 0.38. He had a TBI of 0.43 on the left they could not do an ABI because he could not tolerate the pressure cuff. He has not followed up. The patient is apparently a diabetic although he has not been on any treatment in years and its not clear that he is even followed up. Other past medical history includes coronary artery disease, ischemic cardiomyopathy status post pacemaker, he is on Plavix, He did not cooperate with our attempt to do ABIs on the right leg in our clinic 5/5; patient presents for follow-up. Last week he was started on Iodosorb under  Kerlix/Coban. He has no issues or complaints today. He has his venous and arterial studies scheduled for next week. He currently denies signs of infection. 5/16; patient presents for follow-up. Last week he was started on Hydrofera Blue and gentamicin and mupirocin ointment under Kerlix/Coban. He tolerated this well. He had a PCR culture that reported high levels of Streptococcus agalactiae, Pseudomonas aeruginosa and Enterococcus bacillus. Keystone antibiotics were ordered and he has this however did not bring it today. He had ABIs with TBI's and venous reflux studies. He had evidence of reflux throughout the right and left venous system. His ABIs on the right was noncompressible with a TBI of 0.19. He currently denies systemic signs of infection. 5/23; patient presents for follow-up. He has been using Keystone antibiotic to the wound bed without issues. He currently denies signs of infection. He has not heard back from Dr. Kennon Holter office. 6/8; patient is back his wound is somewhat better with less undermining but still the same basic surface area. The wound surface looks better in terms of nonviable material. But the other issue that concerns me is this patient probably has some minimal distance claudication and may even have claudication at rest at night in the right leg. Referencing my notes from his admission he has had previous bilateral common iliac artery stenting and angioplasties in 2019. For some reason I do not know that he ever had any follow-up. 6/19; patient presents for follow-up. He saw Dr. Luan Pulling on 6/16 that recommended a CT angiogram to evaluate iliac stents and right common femoral disease. Patient  is using Keystone antibiotic with [facility] without issues. He denies signs of infection. 6/26; patient presents for follow-up. He had a CT angiogram that showed a patent common iliac artery. There is advanced right femoral popliteal disease including SFA stenosis with plaque  extending into the SFA and profunda femoris. 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 He has an appointment scheduled tomorrow with Dr. Roselie Awkward to review these results. 7/10; patient presents for follow-up. He is scheduled for right femoral endarterectomy on 7/13 with Dr. Roselie Awkward. He has developed a smaller wound more proximal to the original wound. He is not sure how this started. He has been using Keystone antibiotic and Hydrofera Blue to the wound bed. He denies signs of infection. 7/18; patient presents for follow-up. On 7/13 he had a right lower extremity angiogram with right common femoral and profunda femoris artery angioplasty and stenting. The previous plan for femoral endarterectomy was canceled. He has been using Keystone and [facility] to the wound bed. He currently denies signs of infection. 7/25; patient presents for follow-up. He has been using Keystone and [facility] to the wound bed. He reports improvement in his chronic pain and wound healing. 7/31; patient presents for follow-up. He has been using Keystone and [facility] to the wound bed. He has no issues or complaints today. 8/8; patient presents for follow-up. He has been using Keystone and [facility] to the wound bed. The blue dressing was stuck to the wound bed today. 8/21; patient presents for follow-up. Patient has been using Medihoney to the wound bed for the past week. He reports improvement in wound healing. He has no issues or complaints today. 8/28; patient presents for follow-up. We have been using collagen to the wound bed for the past week. He reports improvement in wound healing. He has no issues or complaints today. 9/14; apparently the patient was healed since his last visit here but he ended up traumatizing roughly the same area on his walker and he is back in for review today with an open area on the right anterior lower leg. He does not  have any form of protective stockings. He has known significant PAD status post revascularization by vein and vascular with angioplasty and stenting of the right common femoral and profunda femoris artery Electronic Signature(s) Signed: 02/12/2022 4:35:07 PM By: Linton Ham MD Entered By: Linton Ham on 02/12/2022 14:43:12 -------------------------------------------------------------------------------- Physical Exam Details Patient Name: Date of Service: [name]. 02/12/2022 1:30 PM Medical Record Number: [id number] Patient Account Number: [id number] Date of Birth/Sex: Treating RN: 06/22/1933 (88 y.o. M) Primary Care Provider: [name] Other Clinician: Referring Provider: Treating Provider/Extender: [name] in Treatment: 19 Constitutional Sitting or standing Blood Pressure is within target range for patient.. Pulse regular and within target range for patient.Marland Kitchen Respirations regular, non-labored and within target range.. Temperature is normal and within the target range for the patient.Marland Kitchen Appears in no distress. Cardiovascular . Notes Wound exam; right anterior lower extremity. There are 2 small areas that are open on the right anterior mid tibia. Both of them leaking edema fluid there is no evidence of surrounding infection. Pitting edema in both lower legs, hemosiderin deposition Electronic Signature(s) Signed: 02/12/2022 4:35:07 PM By: Linton Ham MD Entered By: Linton Ham on 02/12/2022 14:46:30 -------------------------------------------------------------------------------- Physician Orders Details Patient Name: Date of Service: [name]. 02/12/2022 1:30 PM Medical Record Number: [id number] Patient Account Number: [id number] Date of Birth/Sex: Treating RN: 04/13/1934 (88  y.o. 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 - Dr. Heber Hamilton and [name], Room 8 Return A Other: - Purchase compression stockings from [employer]. Bathing/ Shower/ Hygiene May shower with protection but do not get wound dressing(s) wet. Edema Control - Lymphedema / SCD / Other Elevate legs to the level of the heart or above for 30 minutes daily and/or when sitting, a frequency of: - 3-4 times a day throughout the day. Avoid standing for long periods of time. Exercise regularly Moisturize legs daily. - every night before bed. Compression stocking or Garment 10-20 mm/Hg pressure to: - Apply in the morning and remove at night. Wound Treatment Wound #1 - Lower Leg Wound Laterality: Right, Anterior Cleanser: Soap and Water Every Other Day/30 Days Discharge Instructions: May shower and wash wound with dial antibacterial soap and water prior to dressing change. Cleanser: Wound Cleanser (Generic) Every Other Day/30 Days Discharge Instructions: Cleanse the wound with wound cleanser prior to applying a clean dressing using gauze sponges, not tissue or cotton balls. Peri-Wound Care: Skin Prep (DME) (Generic) Every Other Day/30 Days Discharge Instructions: Use skin prep as directed Peri-Wound Care: Sween Lotion (Moisturizing lotion) Every Other Day/30 Days Discharge Instructions: Apply moisturizing lotion as directed Prim Dressing: Fibracol Plus Dressing, 4x4.38 in (collagen) (DME) (Dispense As Written) Every Other Day/30 Days ary Discharge Instructions:  Moisten collagen with saline or hydrogel Secondary Dressing: Zetuvit Plus Silicone Border Dressing 4x4 (in/in) (Generic) Every Other Day/30 Days Discharge Instructions: Apply silicone border over primary dressing as directed. Electronic Signature(s) Signed: 02/12/2022 4:35:07 PM By: Linton Ham MD Signed: 02/13/2022 5:40:20 PM By: Deon Pilling RN, BSN Entered By: Deon Pilling on 02/12/2022 14:13:54 -------------------------------------------------------------------------------- Problem List Details Patient Name: Date of Service: [name]. 02/12/2022 1:30 PM Medical Record Number: [id number] Patient Account Number: [id number] Date of Birth/Sex: Treating RN: [birth year] (88 y.o. [name], Meta.Reding Primary Care Provider: [name] Other Clinician: Referring Provider: Treating Provider/Extender: [name] in Treatment: 19 Active Problems ICD-10 Encounter Code Description Active Date MDM Diagnosis S80.11XD Contusion of right lower leg, subsequent encounter 09/26/2021 No Yes L97.818 Non-pressure chronic ulcer of other part of right lower leg with other specified 09/26/2021 No Yes severity I87.311 Chronic venous hypertension (idiopathic) with ulcer of right lower extremity 09/26/2021 No Yes I70.238 Atherosclerosis of native arteries of right leg with ulceration of other part of 10/14/2021 No Yes lower leg Inactive Problems Resolved Problems Electronic Signature(s) Signed: 02/12/2022 4:35:07 PM By: Linton Ham MD Entered By: Linton Ham on 02/12/2022 14:39:33 -------------------------------------------------------------------------------- Progress Note Details Patient Name: Date of Service: [name]. 02/12/2022 1:30 PM Medical Record Number: [id number] Patient Account Number: [id number] Date of Birth/Sex: Treating RN: 04/08/1934 (88 y.o. M) Primary Care Provider: [name] Other Clinician: Referring Provider: Treating  Provider/Extender: [name] in Treatment: 19 Subjective History of Present Illness (HPI) ADMISSION 09/26/21 This is a 88 year old man who lives near [place]. He is accompanied by his daughter who lives with him. His wife is also at home. Apparently  2 months ago he traumatized the right anterior lower leg x2 in quick succession including once on the back of a pickup truck or car. By description it sounds as though he had a black eschar on this which fell off about a month ago. He has been left with an open wound. It this is not that large but is completely slough covered and has undermining of at least a centimeter and half from 12-6 o'clock. He received a course of antibiotics earlier this month from his primary care doctor Ceftin. He is not on antibiotics currently. The patient has a complicated relevant history. 2019 he underwent bilateral common iliac artery stenting and angioplasties by Dr. Carlis Abbott. He had ABIs at the same time which showed a ABI at 0.55 on the right and a TBI of 0.38. He had a TBI of 0.43 on the left they could not do an ABI because he could not tolerate the pressure cuff. He has not followed up. The patient is apparently a diabetic although he has not been on any treatment in years and its not clear that he is even followed up. Other past medical history includes coronary artery disease, ischemic cardiomyopathy status post pacemaker, he is on Plavix, He did not cooperate with our attempt to do ABIs on the right leg in our clinic 5/5; patient presents for follow-up. Last week he was started on Iodosorb under Kerlix/Coban. He has no issues or complaints today. He has his venous and arterial studies scheduled for next week. He currently denies signs of infection. 5/16; patient presents for follow-up. Last week he was started on Hydrofera Blue and gentamicin and mupirocin ointment under Kerlix/Coban. He tolerated this well. He had a PCR culture that  reported high levels of Streptococcus agalactiae, Pseudomonas aeruginosa and Enterococcus bacillus. Keystone antibiotics were ordered and he has this however did not bring it today. He had ABIs with TBI's and venous reflux studies. He had evidence of reflux throughout the right and left venous system. His ABIs on the right was noncompressible with a TBI of 0.19. He currently denies systemic signs of infection. 5/23; patient presents for follow-up. He has been using Keystone antibiotic to the wound bed without issues. He currently denies signs of infection. He has not heard back from Dr. Kennon Holter office. 6/8; patient is back his wound is somewhat better with less undermining but still the same basic surface area. The wound surface looks better in terms of nonviable material. But the other issue that concerns me is this patient probably has some minimal distance claudication and may even have claudication at rest at night in the right leg. Referencing my notes from his admission he has had previous bilateral common iliac artery stenting and angioplasties in 2019. For some reason I do not know that he ever had any follow-up. 6/19; patient presents for follow-up. He saw Dr. Luan Pulling on 6/16 that recommended a CT angiogram to evaluate iliac stents and right common femoral disease. Patient is using Keystone antibiotic with [facility] without issues. He denies signs of infection. 6/26; patient presents for follow-up. He had a CT angiogram that showed a patent common iliac artery. There is advanced right femoral popliteal disease including SFA stenosis with plaque extending into the SFA and profunda femoris. 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 He has an appointment scheduled tomorrow with Dr. Roselie Awkward to review these results. 7/10; patient presents for follow-up. He is scheduled for right femoral endarterectomy on 7/13  with Dr. Roselie Awkward. He has  developed a smaller wound more proximal to the original wound. He is not sure how this started. He has been using Keystone antibiotic and Hydrofera Blue to the wound bed. He denies signs of infection. 7/18; patient presents for follow-up. On 7/13 he had a right lower extremity angiogram with right common femoral and profunda femoris artery angioplasty and stenting. The previous plan for femoral endarterectomy was canceled. He has been using Keystone and [facility] to the wound bed. He currently denies signs of infection. 7/25; patient presents for follow-up. He has been using Keystone and [facility] to the wound bed. He reports improvement in his chronic pain and wound healing. 7/31; patient presents for follow-up. He has been using Keystone and [facility] to the wound bed. He has no issues or complaints today. 8/8; patient presents for follow-up. He has been using Keystone and [facility] to the wound bed. The blue dressing was stuck to the wound bed today. 8/21; patient presents for follow-up. Patient has been using Medihoney to the wound bed for the past week. He reports improvement in wound healing. He has no issues or complaints today. 8/28; patient presents for follow-up. We have been using collagen to the wound bed for the past week. He reports improvement in wound healing. He has no issues or complaints today. 9/14; apparently the patient was healed since his last visit here but he ended up traumatizing roughly the same area on his walker and he is back in for review today with an open area on the right anterior lower leg. He does not have any form of protective stockings. He has known significant PAD status post revascularization by vein and vascular with angioplasty and stenting of the right common femoral and profunda femoris artery Objective Constitutional Sitting or standing Blood Pressure is within target range for patient.. Pulse regular and within target range for  patient.Marland Kitchen Respirations regular, non-labored and within target range.. Temperature is normal and within the target range for the patient.Marland Kitchen Appears in no distress. Vitals Time Taken: 1:40 PM, Height: 69 in, Weight: 168 lbs, BMI: 24.8, Temperature: 97.9 F, Pulse: 53 bpm, Respiratory Rate: 20 breaths/min, Blood Pressure: 122/57 mmHg. General Notes: Wound exam; right anterior lower extremity. There are 2 small areas that are open on the right anterior mid tibia. Both of them leaking edema fluid there is no evidence of surrounding infection. Pitting edema in both lower legs, hemosiderin deposition Integumentary (Hair, Skin) Wound #1 status is Open. Original cause of wound was Trauma. The date acquired was: 07/02/2021. The wound has been in treatment 19 weeks. The wound is located on the Right,Anterior Lower Leg. The wound measures 5cm length x 4.3cm width x 0.1cm depth; 16.886cm^2 area and 1.689cm^3 volume. There is Fat Layer (Subcutaneous Tissue) exposed. There is no tunneling or undermining noted. There is a small amount of serosanguineous drainage noted. The wound margin is well defined and not attached to the wound base. There is no granulation within the wound bed. There is no necrotic tissue within the wound bed. 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 - Dr. Heber Peters and [name], Room 8 Other: - Purchase compression stockings from [employer]. Bathing/  Shower/ Hygiene: May shower with protection but do not get wound dressing(s) wet. Edema Control - Lymphedema / SCD / Other: Elevate legs to the level of the heart or above for 30  minutes daily and/or when sitting, a frequency of: - 3-4 times a day throughout the day. Avoid standing for long periods of time. Exercise regularly Moisturize legs daily. - every night before bed. Compression stocking or Garment 10-20 mm/Hg pressure to: - Apply in the morning and remove at night. WOUND #1: - Lower Leg Wound Laterality: Right, Anterior Cleanser: Soap and Water Every Other Day/30 Days Discharge Instructions: May shower and wash wound with dial antibacterial soap and water prior to dressing change. Cleanser: Wound Cleanser (Generic) Every Other Day/30 Days Discharge Instructions: Cleanse the wound with wound cleanser prior to applying a clean dressing using gauze sponges, not tissue or cotton balls. Peri-Wound Care: Skin Prep (DME) (Generic) Every Other Day/30 Days Discharge Instructions: Use skin prep as directed Peri-Wound Care: Sween Lotion (Moisturizing lotion) Every Other Day/30 Days Discharge Instructions: Apply moisturizing lotion as directed Prim Dressing: Fibracol Plus Dressing, 4x4.38 in (collagen) (DME) (Dispense As Written) Every Other Day/30 Days ary Discharge Instructions: Moisten collagen with saline or hydrogel Secondary Dressing: Zetuvit Plus Silicone Border Dressing 4x4 (in/in) (Generic) Every Other Day/30 Days Discharge Instructions: Apply silicone border over primary dressing as directed. 1. We reapplied the silver collagen border foam 2. Went ahead and suggested support hose to help control the swelling and also to prevent wounds from the incidental trauma similar to what he had this week. 3 he is going to need some form of weight compression stockings/support hose we suggested elastic therapy Electronic Signature(s) Signed: 02/12/2022 4:35:07 PM By: Linton Ham MD Signed: 02/12/2022 4:35:07 PM By: Linton Ham MD Entered By: Linton Ham on 02/12/2022  14:49:41 -------------------------------------------------------------------------------- SuperBill Details Patient Name: Date of Service: [name]. 02/12/2022 Medical Record Number: [id number] Patient Account Number: [id number] Date of Birth/Sex: Treating RN: [birth year] (88 y.o. M) Primary Care Provider: [name] Other Clinician: Referring Provider: Treating Provider/Extender: [name] in Treatment: 19 Diagnosis Coding ICD-10 Codes Code Description S80.11XD Contusion of right lower leg, subsequent encounter L97.818 Non-pressure chronic ulcer of other part of right lower leg with other specified severity I87.311 Chronic venous hypertension (idiopathic) with ulcer of right lower extremity I70.238 Atherosclerosis of native arteries of right leg with ulceration of other part of lower leg Physician Procedures : CPT4 Code Description Modifier [id number] - WC PHYS LEVEL 3 - EST PT ICD-10 Diagnosis Description S80.11XD Contusion of right lower leg, subsequent encounter L97.818 Non-pressure chronic ulcer of other part of right lower leg with other specified  severity I87.311 Chronic venous hypertension (idiopathic) with ulcer of right lower extremity I70.238 Atherosclerosis of native arteries of right leg with ulceration of other part of lower leg Quantity: 1 Electronic Signature(s) Signed: 02/12/2022 4:35:07 PM By: Linton Ham MD Entered By: Linton Ham on 02/12/2022 14:50:04

## 2022-02-14 ENCOUNTER — Other Ambulatory Visit: Payer: Self-pay | Admitting: Internal Medicine

## 2022-02-14 DIAGNOSIS — E063 Autoimmune thyroiditis: Secondary | ICD-10-CM

## 2022-02-16 DIAGNOSIS — Z9622 Myringotomy tube(s) status: Secondary | ICD-10-CM | POA: Diagnosis not present

## 2022-02-16 DIAGNOSIS — H9190 Unspecified hearing loss, unspecified ear: Secondary | ICD-10-CM | POA: Diagnosis not present

## 2022-02-20 ENCOUNTER — Ambulatory Visit (INDEPENDENT_AMBULATORY_CARE_PROVIDER_SITE_OTHER): Payer: Medicare Other

## 2022-02-20 DIAGNOSIS — I255 Ischemic cardiomyopathy: Secondary | ICD-10-CM | POA: Diagnosis not present

## 2022-02-20 LAB — CUP PACEART REMOTE DEVICE CHECK
Battery Remaining Longevity: 114 mo
Battery Remaining Percentage: 100 %
Brady Statistic RA Percent Paced: 3 %
Brady Statistic RV Percent Paced: 100 %
Date Time Interrogation Session: 20230922024100
HighPow Impedance: 43 Ohm
Implantable Lead Implant Date: 20020206
Implantable Lead Implant Date: 20071005
Implantable Lead Implant Date: 20220624
Implantable Lead Implant Date: 20220624
Implantable Lead Location: 753858
Implantable Lead Location: 753859
Implantable Lead Location: 753860
Implantable Lead Location: 753860
Implantable Lead Model: 148
Implantable Lead Model: 4088
Implantable Lead Model: 4677
Implantable Lead Model: 7841
Implantable Lead Serial Number: 1073796
Implantable Lead Serial Number: 109512
Implantable Lead Serial Number: 224392
Implantable Lead Serial Number: 824442
Implantable Pulse Generator Implant Date: 20220624
Lead Channel Impedance Value: 459 Ohm
Lead Channel Impedance Value: 567 Ohm
Lead Channel Impedance Value: 800 Ohm
Lead Channel Pacing Threshold Amplitude: 0.7 V
Lead Channel Pacing Threshold Pulse Width: 0.4 ms
Lead Channel Setting Pacing Amplitude: 2 V
Lead Channel Setting Pacing Amplitude: 2.5 V
Lead Channel Setting Pacing Amplitude: 3 V
Lead Channel Setting Pacing Pulse Width: 0.4 ms
Lead Channel Setting Pacing Pulse Width: 1.2 ms
Lead Channel Setting Sensing Sensitivity: 0.5 mV
Lead Channel Setting Sensing Sensitivity: 1 mV
Pulse Gen Serial Number: 385552

## 2022-02-25 ENCOUNTER — Other Ambulatory Visit (HOSPITAL_COMMUNITY): Payer: Self-pay | Admitting: Adult Health

## 2022-02-25 DIAGNOSIS — I779 Disorder of arteries and arterioles, unspecified: Secondary | ICD-10-CM

## 2022-02-26 ENCOUNTER — Encounter (HOSPITAL_BASED_OUTPATIENT_CLINIC_OR_DEPARTMENT_OTHER): Payer: Medicare Other | Admitting: Internal Medicine

## 2022-02-27 NOTE — Progress Notes (Signed)
Remote ICD transmission.   

## 2022-03-02 ENCOUNTER — Encounter (HOSPITAL_BASED_OUTPATIENT_CLINIC_OR_DEPARTMENT_OTHER): Payer: Medicare Other | Attending: Internal Medicine | Admitting: Internal Medicine

## 2022-03-02 DIAGNOSIS — Z955 Presence of coronary angioplasty implant and graft: Secondary | ICD-10-CM | POA: Diagnosis not present

## 2022-03-02 DIAGNOSIS — X58XXXA Exposure to other specified factors, initial encounter: Secondary | ICD-10-CM | POA: Diagnosis not present

## 2022-03-02 DIAGNOSIS — I70238 Atherosclerosis of native arteries of right leg with ulceration of other part of lower right leg: Secondary | ICD-10-CM | POA: Insufficient documentation

## 2022-03-02 DIAGNOSIS — S8011XA Contusion of right lower leg, initial encounter: Secondary | ICD-10-CM | POA: Diagnosis not present

## 2022-03-02 DIAGNOSIS — I87311 Chronic venous hypertension (idiopathic) with ulcer of right lower extremity: Secondary | ICD-10-CM | POA: Diagnosis not present

## 2022-03-02 DIAGNOSIS — L97818 Non-pressure chronic ulcer of other part of right lower leg with other specified severity: Secondary | ICD-10-CM | POA: Insufficient documentation

## 2022-03-02 DIAGNOSIS — Z950 Presence of cardiac pacemaker: Secondary | ICD-10-CM | POA: Insufficient documentation

## 2022-03-02 DIAGNOSIS — E1151 Type 2 diabetes mellitus with diabetic peripheral angiopathy without gangrene: Secondary | ICD-10-CM | POA: Insufficient documentation

## 2022-03-02 DIAGNOSIS — S8011XD Contusion of right lower leg, subsequent encounter: Secondary | ICD-10-CM

## 2022-03-02 DIAGNOSIS — I255 Ischemic cardiomyopathy: Secondary | ICD-10-CM | POA: Insufficient documentation

## 2022-03-02 DIAGNOSIS — Z7902 Long term (current) use of antithrombotics/antiplatelets: Secondary | ICD-10-CM | POA: Insufficient documentation

## 2022-03-02 DIAGNOSIS — I2510 Atherosclerotic heart disease of native coronary artery without angina pectoris: Secondary | ICD-10-CM | POA: Diagnosis not present

## 2022-03-02 NOTE — Progress Notes (Signed)
Shea, Jonathon ([id number]) Visit Report for 03/02/2022 Chief Complaint Document Details Patient Name: Date of Service: [name], [name] 03/02/2022 3:00 PM Medical Record Number: [id number] Patient Account Number: [id number] Date of Birth/Sex: Treating RN: November 05, 1933 (88 y.o. [name] Primary Care Provider: [name] Other Clinician: Referring Provider: Treating Provider/Extender: [name] in Treatment: 22 Information Obtained from: Patient Chief Complaint 09/26/2021; patient is here for a review of a complicated wound on the right anterior lower leg which was initially secondary to trauma Electronic Signature(s) Signed: 03/02/2022 4:32:49 PM By: Kalman Shan DO Entered By: Kalman Shan on 03/02/2022 15:48:37 -------------------------------------------------------------------------------- HPI Details Patient Name: Date of Service: [name]. 03/02/2022 3:00 PM Medical Record Number: [id number] Patient Account Number: [id number] Date of Birth/Sex: Treating RN: [birth year] (88 y.o. [name] Primary Care Provider: [name] Other Clinician: Referring Provider: Treating Provider/Extender: [name] in Treatment: 82 History of Present Illness HPI Description: ADMISSION 09/26/21 This is a 88 year old man who lives near [place]. He is accompanied by his daughter who lives with him. His wife is also at home. Apparently 2 months ago he traumatized the right anterior lower leg x2 in quick succession including once on the back of a pickup truck or car. By description it sounds as though he had a black eschar on this which fell off about a month ago. He has been left with an open wound. It this is not that large but is completely slough covered and has undermining of at least a centimeter and half from 12-6 o'clock. He received a course of antibiotics earlier this month from his primary care doctor Ceftin.  He is not on antibiotics currently. The patient has a complicated relevant history. 2019 he underwent bilateral common iliac artery stenting and angioplasties by Dr. Carlis Abbott. He had ABIs at the same time which showed a ABI at 0.55 on the right and a TBI of 0.38. He had a TBI of 0.43 on the left they could not do an ABI because he could not tolerate the pressure cuff. He has not followed up. The patient is apparently a diabetic although he has not been on any treatment in years and its not clear that he is even followed up. Other past medical history includes coronary artery disease, ischemic cardiomyopathy status post pacemaker, he is on Plavix, He did not cooperate with our attempt to do ABIs on the right leg in our clinic 5/5; patient presents for follow-up. Last week he was started on Iodosorb under Kerlix/Coban. He has no issues or complaints today. He has his venous and arterial studies scheduled for next week. He currently denies signs of infection. 5/16; patient presents for follow-up. Last week he was started on Hydrofera Blue and gentamicin and mupirocin ointment under Kerlix/Coban. He tolerated this well. He had a PCR culture that reported high levels of Streptococcus agalactiae, Pseudomonas aeruginosa and Enterococcus bacillus. Keystone antibiotics were ordered and he has this however did not bring it today. He had ABIs with TBI's and venous reflux studies. He had evidence of reflux throughout the right and left venous system. His ABIs on the right was noncompressible with a TBI of 0.19. He currently denies systemic signs of infection. 5/23; patient presents for follow-up. He has been using Keystone antibiotic to the wound bed without issues. He currently denies signs of infection. He has not heard back from Dr. Kennon Holter office. 6/8; patient is back his wound is somewhat better with less undermining but still  the same basic surface area. The wound surface looks better in terms of nonviable  material. But the other issue that concerns me is this patient probably has some minimal distance claudication and may even have claudication at rest at night in the right leg. Referencing my notes from his admission he has had previous bilateral common iliac artery stenting and angioplasties in 2019. For some reason I do not know that he ever had any follow-up. 6/19; patient presents for follow-up. He saw Dr. Luan Pulling on 6/16 that recommended a CT angiogram to evaluate iliac stents and right common femoral disease. Patient is using Keystone antibiotic with [facility] without issues. He denies signs of infection. 6/26; patient presents for follow-up. He had a CT angiogram that showed a patent common iliac artery. There is advanced right femoral popliteal disease including SFA stenosis with plaque extending into the SFA and profunda femoris. 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 He has an appointment scheduled tomorrow with Dr. Roselie Awkward to review these results. 7/10; patient presents for follow-up. He is scheduled for right femoral endarterectomy on 7/13 with Dr. Roselie Awkward. He has developed a smaller wound more proximal to the original wound. He is not sure how this started. He has been using Keystone antibiotic and Hydrofera Blue to the wound bed. He denies signs of infection. 7/18; patient presents for follow-up. On 7/13 he had a right lower extremity angiogram with right common femoral and profunda femoris artery angioplasty and stenting. The previous plan for femoral endarterectomy was canceled. He has been using Keystone and [facility] to the wound bed. He currently denies signs of infection. 7/25; patient presents for follow-up. He has been using Keystone and [facility] to the wound bed. He reports improvement in his chronic pain and wound healing. 7/31; patient presents for follow-up. He has been using Keystone and [facility] to the wound bed. He has no issues or complaints today. 8/8; patient presents for follow-up. He has been using Keystone and [facility] to the wound bed. The blue dressing was stuck to the wound bed today. 8/21; patient presents for follow-up. Patient has been using Medihoney to the wound bed for the past week. He reports improvement in wound healing. He has no issues or complaints today. 8/28; patient presents for follow-up. We have been using collagen to the wound bed for the past week. He reports improvement in wound healing. He has no issues or complaints today. 9/14; apparently the patient was healed since his last visit here but he ended up traumatizing roughly the same area on his walker and he is back in for review today with an open area on the right anterior lower leg. He does not have any form of protective stockings. He has known significant PAD status post revascularization by vein and vascular with angioplasty and stenting of the right common femoral and profunda femoris artery 10/2; patient presents for follow-up. He has been using collagen to the wound bed. The original wound has healed. He is unable to tolerate compression stockings. Electronic Signature(s) Signed: 03/02/2022 4:32:49 PM By: Kalman Shan DO Entered By: Kalman Shan on 03/02/2022 15:49:19 -------------------------------------------------------------------------------- Physical Exam Details Patient Name: Date of Service: [name]. 03/02/2022 3:00 PM Medical Record Number: [id number] Patient Account Number: [id number] Date of Birth/Sex: Treating RN: June 12, 1933 (88 y.o. [name] Primary Care Provider: [name] Other Clinician: Referring Provider: Treating Provider/Extender: [name] in Treatment: 22 Constitutional respirations regular,  non-labored and within target range for patient.. Cardiovascular 2+ dorsalis pedis/posterior tibialis  pulses. Psychiatric pleasant and cooperative. Notes Right lower extremity: Epithelization to the previous wound site. 2+ pitting edema to the knee. Electronic Signature(s) Signed: 03/02/2022 4:32:49 PM By: Kalman Shan DO Entered By: Kalman Shan on 03/02/2022 15:49:44 -------------------------------------------------------------------------------- Physician Orders Details Patient Name: Date of Service: [name]. 03/02/2022 3:00 PM Medical Record Number: [id number] Patient Account Number: [id number] Date of Birth/Sex: Treating RN: May 20, 1934 (88 y.o. [name], [name] Primary Care Provider: [name] Other Clinician: Referring Provider: Treating Provider/Extender: [name] in Treatment: 107 Verbal / Phone Orders: No Diagnosis Coding ICD-10 Coding Code Description S80.11XD Contusion of right lower leg, subsequent encounter L97.818 Non-pressure chronic ulcer of other part of right lower leg with other specified severity I87.311 Chronic venous hypertension (idiopathic) with ulcer of right lower extremity I70.238 Atherosclerosis of native arteries of right leg with ulceration of other part of lower leg Discharge From [facility] Discharge from [place] - Call if any future wound care needs. Apply any antibiotic ointment and Band-Aid to any scraped areas. Electronic Signature(s) Signed: 03/02/2022 4:32:49 PM By: Kalman Shan DO Entered By: Kalman Shan on 03/02/2022 15:49:50 -------------------------------------------------------------------------------- Problem List Details Patient Name: Date of Service: [name]. 03/02/2022 3:00 PM Medical Record Number: [id number] Patient Account Number: [id number] Date of Birth/Sex: Treating RN: December 03, 1933 (88 y.o. [name], Meta.Reding Primary Care Provider: [name] Other Clinician: Referring Provider: Treating Provider/Extender: [name] in  Treatment: 22 Active Problems ICD-10 Encounter Code Description Active Date MDM Diagnosis S80.11XD Contusion of right lower leg, subsequent encounter 09/26/2021 No Yes L97.818 Non-pressure chronic ulcer of other part of right lower leg with other specified 09/26/2021 No Yes severity I87.311 Chronic venous hypertension (idiopathic) with ulcer of right lower extremity 09/26/2021 No Yes I70.238 Atherosclerosis of native arteries of right leg with ulceration of other part of 10/14/2021 No Yes lower leg Inactive Problems Resolved Problems Electronic Signature(s) Signed: 03/02/2022 4:32:49 PM By: Kalman Shan DO Entered By: Kalman Shan on 03/02/2022 15:48:26 -------------------------------------------------------------------------------- Progress Note Details Patient Name: Date of Service: [name]. 03/02/2022 3:00 PM Medical Record Number: [id number] Patient Account Number: [id number] Date of Birth/Sex: Treating RN: [birth year] (88 y.o. [name] Primary Care Provider: [name] Other Clinician: Referring Provider: Treating Provider/Extender: [name] in Treatment: 22 Subjective Chief Complaint Information obtained from Patient 09/26/2021; patient is here for a review of a complicated wound on the right anterior lower leg which was initially secondary to trauma History of Present Illness (HPI) ADMISSION 09/26/21 This is a 88 year old man who lives near [place]. He is accompanied by his daughter who lives with him. His wife is also at home. Apparently 2 months ago he traumatized the right anterior lower leg x2 in quick succession including once on the back of a pickup truck or car. By description it sounds as though he had a black eschar on this which fell off about a month ago. He has been left with an open wound. It this is not that large but is completely slough covered and has undermining of at least a centimeter and half from 12-6  o'clock. He received a course of antibiotics earlier this month from his primary care doctor Ceftin. He is not on antibiotics currently. The patient has a complicated relevant history. 2019 he underwent bilateral common iliac artery stenting and angioplasties by Dr. Carlis Abbott. He had ABIs at the same time which showed  a ABI at 0.55 on the right and a TBI of 0.38. He had a TBI of 0.43 on the left they could not do an ABI because he could not tolerate the pressure cuff. He has not followed up. The patient is apparently a diabetic although he has not been on any treatment in years and its not clear that he is even followed up. Other past medical history includes coronary artery disease, ischemic cardiomyopathy status post pacemaker, he is on Plavix, He did not cooperate with our attempt to do ABIs on the right leg in our clinic 5/5; patient presents for follow-up. Last week he was started on Iodosorb under Kerlix/Coban. He has no issues or complaints today. He has his venous and arterial studies scheduled for next week. He currently denies signs of infection. 5/16; patient presents for follow-up. Last week he was started on Hydrofera Blue and gentamicin and mupirocin ointment under Kerlix/Coban. He tolerated this well. He had a PCR culture that reported high levels of Streptococcus agalactiae, Pseudomonas aeruginosa and Enterococcus bacillus. Keystone antibiotics were ordered and he has this however did not bring it today. He had ABIs with TBI's and venous reflux studies. He had evidence of reflux throughout the right and left venous system. His ABIs on the right was noncompressible with a TBI of 0.19. He currently denies systemic signs of infection. 5/23; patient presents for follow-up. He has been using Keystone antibiotic to the wound bed without issues. He currently denies signs of infection. He has not heard back from Dr. Kennon Holter office. 6/8; patient is back his wound is somewhat better with less  undermining but still the same basic surface area. The wound surface looks better in terms of nonviable material. But the other issue that concerns me is this patient probably has some minimal distance claudication and may even have claudication at rest at night in the right leg. Referencing my notes from his admission he has had previous bilateral common iliac artery stenting and angioplasties in 2019. For some reason I do not know that he ever had any follow-up. 6/19; patient presents for follow-up. He saw Dr. Luan Pulling on 6/16 that recommended a CT angiogram to evaluate iliac stents and right common femoral disease. Patient is using Keystone antibiotic with [facility] without issues. He denies signs of infection. 6/26; patient presents for follow-up. He had a CT angiogram that showed a patent common iliac artery. There is advanced right femoral popliteal disease including SFA stenosis with plaque extending into the SFA and profunda femoris. 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 He has an appointment scheduled tomorrow with Dr. Roselie Awkward to review these results. 7/10; patient presents for follow-up. He is scheduled for right femoral endarterectomy on 7/13 with Dr. Roselie Awkward. He has developed a smaller wound more proximal to the original wound. He is not sure how this started. He has been using Keystone antibiotic and Hydrofera Blue to the wound bed. He denies signs of infection. 7/18; patient presents for follow-up. On 7/13 he had a right lower extremity angiogram with right common femoral and profunda femoris artery angioplasty and stenting. The previous plan for femoral endarterectomy was canceled. He has been using Keystone and [facility] to the wound bed. He currently denies signs of infection. 7/25; patient presents for follow-up. He has been using Keystone and [facility] to the wound bed. He reports improvement in his chronic  pain and wound healing. 7/31; patient presents for follow-up. He has been  using Keystone and Hydrofera Blue to the wound bed. He has no issues or complaints today. 8/8; patient presents for follow-up. He has been using Keystone and [facility] to the wound bed. The blue dressing was stuck to the wound bed today. 8/21; patient presents for follow-up. Patient has been using Medihoney to the wound bed for the past week. He reports improvement in wound healing. He has no issues or complaints today. 8/28; patient presents for follow-up. We have been using collagen to the wound bed for the past week. He reports improvement in wound healing. He has no issues or complaints today. 9/14; apparently the patient was healed since his last visit here but he ended up traumatizing roughly the same area on his walker and he is back in for review today with an open area on the right anterior lower leg. He does not have any form of protective stockings. He has known significant PAD status post revascularization by vein and vascular with angioplasty and stenting of the right common femoral and profunda femoris artery 10/2; patient presents for follow-up. He has been using collagen to the wound bed. The original wound has healed. He is unable to tolerate compression stockings. Patient History Information obtained from Patient, Chart. Family History Cancer - Siblings, Kidney Disease - Father. Social History Former smoker - quit 1985, Marital Status - Married, Alcohol Use - Never, Drug Use - No History, Caffeine Use - Daily. Medical History Eyes Patient has history of Cataracts - Extraction 2018 Hematologic/Lymphatic Patient has history of Anemia Respiratory Patient has history of Asthma, Chronic Obstructive Pulmonary Disease (COPD) Cardiovascular Patient has history of Congestive Heart Failure, Coronary Artery Disease, Hypertension, Myocardial Infarction - 2009, Peripheral Arterial  Disease Endocrine Patient has history of Type II Diabetes - Diet Controlled-does not take any medications Hospitalization/Surgery History - angioplasty stent- 2019 dr.Clark. - carotid stents. Medical A Surgical History Notes nd Eyes thyroid eye disease, hypertrophia of right eye, hypotropia of left eye, diplopia- corrected per daughter Cardiovascular Pacemaker/defibrillator Endocrine Thyroiditis, Hypothyroidism Objective Constitutional respirations regular, non-labored and within target range for patient.. Vitals Time Taken: 3:03 PM, Height: 69 in, Weight: 168 lbs, BMI: 24.8, Temperature: 97.9 F, Pulse: 54 bpm, Respiratory Rate: 20 breaths/min, Blood Pressure: 144/64 mmHg. Cardiovascular 2+ dorsalis pedis/posterior tibialis pulses. Psychiatric pleasant and cooperative. General Notes: Right lower extremity: Epithelization to the previous wound site. 2+ pitting edema to the knee. Integumentary (Hair, Skin) Wound #1 status is Healed - Epithelialized. Original cause of wound was Trauma. The date acquired was: 07/02/2021. The wound has been in treatment 22 weeks. The wound is located on the Right,Anterior Lower Leg. The wound measures 0cm length x 0cm width x 0cm depth; 0cm^2 area and 0cm^3 volume. There is Fat Layer (Subcutaneous Tissue) exposed. There is no tunneling or undermining noted. There is a small amount of serosanguineous drainage noted. The wound margin is well defined and not attached to the wound base. There is large (67-100%) red granulation within the wound bed. There is no necrotic tissue within the wound bed. The periwound skin appearance had no abnormalities noted for texture. The periwound skin appearance had no abnormalities noted for moisture. The periwound skin appearance had no abnormalities noted for color. Periwound temperature was noted as No Abnormality. Assessment Active Problems ICD-10 Contusion of right lower leg, subsequent encounter Non-pressure chronic  ulcer of other part of right lower leg with other specified severity Chronic venous hypertension (idiopathic) with ulcer of right lower extremity Atherosclerosis of native arteries of right leg  with ulceration of other part of lower leg Patient has done well with collagen. The original wound has healed. He has a tiny area that is limited to skin breakdown where he scrapped his leg. This appears almost epithelialized. He can use antibiotic ointment here. He would like to follow-up as needed if this becomes an issue. Plan Discharge From [facility] Services: Discharge from [place] - Call if any future wound care needs. Apply any antibiotic ointment and Band-Aid to any scraped areas. 1. Discharge from wound care center 2. Follow-up as needed Electronic Signature(s) Signed: 03/02/2022 4:32:49 PM By: Kalman Shan DO Entered By: Kalman Shan on 03/02/2022 15:51:24 -------------------------------------------------------------------------------- HxROS Details Patient Name: Date of Service: [name]. 03/02/2022 3:00 PM Medical Record Number: [id number] Patient Account Number: [id number] Date of Birth/Sex: Treating RN: 11-26-1933 (88 y.o. [name], Meta.Reding Primary Care Provider: [name] Other Clinician: Referring Provider: Treating Provider/Extender: [name] in Treatment: 54 Information Obtained From Patient Chart Eyes Medical History: Positive for: Cataracts - Extraction 2018 Past Medical History Notes: thyroid eye disease, hypertrophia of right eye, hypotropia of left eye, diplopia- corrected per daughter Hematologic/Lymphatic Medical History: Positive for: Anemia Respiratory Medical History: Positive for: Asthma; Chronic Obstructive Pulmonary Disease (COPD) Cardiovascular Medical History: Positive for: Congestive Heart Failure; Coronary Artery Disease; Hypertension; Myocardial Infarction - 2009; Peripheral Arterial Disease Past  Medical History Notes: Pacemaker/defibrillator Endocrine Medical History: Positive for: Type II Diabetes - Diet Controlled-does not take any medications Past Medical History Notes: Thyroiditis, Hypothyroidism Time with diabetes: 2019 Treated with: Diet Blood sugar tested every day: No HBO Extended History Items Eyes: Cataracts Immunizations Pneumococcal Vaccine: Received Pneumococcal Vaccination: No Implantable Devices Yes Hospitalization / Surgery History Type of Hospitalization/Surgery angioplasty stent- 2019 dr.Clark carotid stents Family and Social History Cancer: Yes - Siblings; Kidney Disease: Yes - Father; Former smoker - quit 1985; Marital Status - Married; Alcohol Use: Never; Drug Use: No History; Caffeine Use: Daily; Financial Concerns: No; Food, Clothing or Shelter Needs: No; Support System Lacking: No; Transportation Concerns: No Electronic Signature(s) Signed: 03/02/2022 4:32:49 PM By: Kalman Shan DO Signed: 03/02/2022 4:42:08 PM By: Deon Pilling RN, BSN Entered By: Kalman Shan on 03/02/2022 15:49:23 -------------------------------------------------------------------------------- [place] Details Patient Name: Date of Service: [name]. 03/02/2022 Medical Record Number: [id number] Patient Account Number: [id number] Date of Birth/Sex: Treating RN: [birth year] (88 y.o. [name], [name] Primary Care Provider: [name] Other Clinician: Referring Provider: Treating Provider/Extender: [name] in Treatment: 22 Diagnosis Coding ICD-10 Codes Code Description S80.11XD Contusion of right lower leg, subsequent encounter L97.818 Non-pressure chronic ulcer of other part of right lower leg with other specified severity I87.311 Chronic venous hypertension (idiopathic) with ulcer of right lower extremity I70.238 Atherosclerosis of native arteries of right leg with ulceration of other part of lower leg Facility  Procedures CPT4 Code: [id number] Description: 71062 - WOUND CARE VISIT-LEV 3 EST PT Modifier: Quantity: 1 Physician Procedures Electronic Signature(s) Signed: 03/02/2022 4:32:49 PM By: Kalman Shan DO Entered By: Kalman Shan on 03/02/2022 15:51:39

## 2022-03-02 NOTE — Progress Notes (Signed)
JESPER, STIREWALT ([id number]) Visit Report for 03/02/2022 Arrival Information Details Patient Name: Date of Service: [name], [name] 03/02/2022 3:00 PM Medical Record Number: [id number] Patient Account Number: [id number] Date of Birth/Sex: Treating RN: 02-09-1934 (88 y.o. [name], [name] Primary Care Coretta Leisey: [name] Other Clinician: Referring Isaly Fasching: Treating Adalbert Alberto/Extender: [name] in Treatment: 75 Visit Information History Since Last Visit Added or deleted any medications: No Patient Arrived: Cane Any new allergies or adverse reactions: No Arrival Time: 15:04 Had a fall or experienced change in No Accompanied By: Daughter activities of daily living that may affect Transfer Assistance: None risk of falls: Patient Identification Verified: Yes Signs or symptoms of abuse/neglect since last visito No Secondary Verification Process Completed: Yes Hospitalized since last visit: No Patient Requires Transmission-Based Precautions: No Implantable device outside of the clinic excluding No Patient Has Alerts: Yes cellular tissue based products placed in the center Patient Alerts: DGL:OVFIEP to obtain;pain since last visit: Has Dressing in Place as Prescribed: Yes Has Compression in Place as Prescribed: No Pain Present Now: No Electronic Signature(s) Signed: 03/02/2022 4:42:08 PM By: Deon Pilling RN, BSN Entered By: Deon Pilling on 03/02/2022 15:04:16 -------------------------------------------------------------------------------- Clinic Level of Care Assessment Details Patient Name: Date of Service: [name], [name]. 03/02/2022 3:00 PM Medical Record Number: [id number] Patient Account Number: [id number] Date of Birth/Sex: Treating RN: 1933-09-10 (88 y.o. [name], [name] Primary Care Satori Krabill: [name] Other Clinician: Referring Nawal Burling: Treating Harlo Fabela/Extender: [name] in Treatment: 22 Clinic Level  of Care Assessment Items TOOL 4 Quantity Score X- 1 0 Use when only an EandM is performed on FOLLOW-UP visit ASSESSMENTS - Nursing Assessment / Reassessment X- 1 10 Reassessment of Co-morbidities (includes updates in patient status) X- 1 5 Reassessment of Adherence to Treatment Plan ASSESSMENTS - Wound and Skin A ssessment / Reassessment X - Simple Wound Assessment / Reassessment - one wound 1 5 '[]'$  - 0 Complex Wound Assessment / Reassessment - multiple wounds '[]'$  - 0 Dermatologic / Skin Assessment (not related to wound area) ASSESSMENTS - Focused Assessment X- 1 5 Circumferential Edema Measurements - multi extremities '[]'$  - 0 Nutritional Assessment / Counseling / Intervention '[]'$  - 0 Lower Extremity Assessment (monofilament, tuning fork, pulses) '[]'$  - 0 Peripheral Arterial Disease Assessment (using hand held doppler) ASSESSMENTS - Ostomy and/or Continence Assessment and Care '[]'$  - 0 Incontinence Assessment and Management '[]'$  - 0 Ostomy Care Assessment and Management (repouching, etc.) PROCESS - Coordination of Care X - Simple Patient / Family Education for ongoing care 1 15 '[]'$  - 0 Complex (extensive) Patient / Family Education for ongoing care X- 1 10 Staff obtains Programmer, systems, Records, T Results / Process Orders est '[]'$  - 0 Staff telephones HHA, Nursing Homes / Clarify orders / etc '[]'$  - 0 Routine Transfer to another Facility (non-emergent condition) '[]'$  - 0 Routine Hospital Admission (non-emergent condition) '[]'$  - 0 New Admissions / Biomedical engineer / Ordering NPWT Apligraf, etc. , '[]'$  - 0 Emergency Hospital Admission (emergent condition) X- 1 10 Simple Discharge Coordination '[]'$  - 0 Complex (extensive) Discharge Coordination PROCESS - Special Needs '[]'$  - 0 Pediatric / Minor Patient Management '[]'$  - 0 Isolation Patient Management '[]'$  - 0 Hearing / Language / Visual special needs '[]'$  - 0 Assessment of Community assistance (transportation, D/C planning, etc.) '[]'$  -  0 Additional assistance / Altered mentation '[]'$  - 0 Support Surface(s) Assessment (bed, cushion, seat, etc.) INTERVENTIONS - Wound Cleansing / Measurement X - Simple Wound Cleansing - one wound 1  5 '[]'$  - 0 Complex Wound Cleansing - multiple wounds X- 1 5 Wound Imaging (photographs - any number of wounds) '[]'$  - 0 Wound Tracing (instead of photographs) X- 1 5 Simple Wound Measurement - one wound '[]'$  - 0 Complex Wound Measurement - multiple wounds INTERVENTIONS - Wound Dressings X - Small Wound Dressing one or multiple wounds 1 10 '[]'$  - 0 Medium Wound Dressing one or multiple wounds '[]'$  - 0 Large Wound Dressing one or multiple wounds '[]'$  - 0 Application of Medications - topical '[]'$  - 0 Application of Medications - injection INTERVENTIONS - Miscellaneous '[]'$  - 0 External ear exam '[]'$  - 0 Specimen Collection (cultures, biopsies, blood, body fluids, etc.) '[]'$  - 0 Specimen(s) / Culture(s) sent or taken to Lab for analysis '[]'$  - 0 Patient Transfer (multiple staff / Civil Service fast streamer / Similar devices) '[]'$  - 0 Simple Staple / Suture removal (25 or less) '[]'$  - 0 Complex Staple / Suture removal (26 or more) '[]'$  - 0 Hypo / Hyperglycemic Management (close monitor of Blood Glucose) '[]'$  - 0 Ankle / Brachial Index (ABI) - do not check if billed separately X- 1 5 Vital Signs Has the patient been seen at the hospital within the last three years: Yes Total Score: 90 Level Of Care: New/Established - Level 3 Electronic Signature(s) Signed: 03/02/2022 4:42:08 PM By: Deon Pilling RN, BSN Entered By: Deon Pilling on 03/02/2022 15:25:15 -------------------------------------------------------------------------------- Encounter Discharge Information Details Patient Name: Date of Service: [name]. 03/02/2022 3:00 PM Medical Record Number: [id number] Patient Account Number: [id number] Date of Birth/Sex: Treating RN: 12/20/1933 (88 y.o. [name] Primary Care Bristol Soy: [name] Other  Clinician: Referring Lenon Kuennen: Treating Hisao Doo/Extender: [name] in Treatment: 22 Encounter Discharge Information Items Discharge Condition: Stable Ambulatory Status: Cane Discharge Destination: Home Transportation: Private Auto Accompanied By: daughter Schedule Follow-up Appointment: No Clinical Summary of Care: Notes antibiotic ointment and bandage applied to closed area. Electronic Signature(s) Signed: 03/02/2022 4:42:08 PM By: Deon Pilling RN, BSN Entered By: Deon Pilling on 03/02/2022 15:26:23 -------------------------------------------------------------------------------- Lower Extremity Assessment Details Patient Name: Date of Service: [name]. 03/02/2022 3:00 PM Medical Record Number: [id number] Patient Account Number: [id number] Date of Birth/Sex: Treating RN: Feb 06, 1934 (88 y.o. [name] Primary Care Addalynn Kumari: [name] Other Clinician: Referring Lai Hendriks: Treating Khadejah Son/Extender: [name] in Treatment: 22 Edema Assessment Assessed: [name]: No] [name]: Yes] Edema: [Left: Ye] [Right: s] Calf Left: Right: Point of Measurement: 31 cm From Medial Instep 32 cm Ankle Left: Right: Point of Measurement: 7 cm From Medial Instep 24 cm Vascular Assessment Pulses: Dorsalis Pedis Palpable: [Right:Yes] Electronic Signature(s) Signed: 03/02/2022 4:42:08 PM By: Deon Pilling RN, BSN Entered By: Deon Pilling on 03/02/2022 15:04:00 -------------------------------------------------------------------------------- Multi Wound Chart Details Patient Name: Date of Service: [name]. 03/02/2022 3:00 PM Medical Record Number: [id number] Patient Account Number: [id number] Date of Birth/Sex: Treating RN: 1933-06-25 (88 y.o. 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 Secondary Etiology: Extremity Cataracts, Anemia, Asthma, Chronic N/A N/A Comorbid History: Obstructive Pulmonary Disease (COPD), Congestive Heart Failure, Coronary Artery Disease, Hypertension, Myocardial Infarction, Peripheral Arterial Disease, Type II Diabetes 07/02/2021 N/A  N/A Date Acquired: 58 N/A N/A Weeks of Treatment: Healed - Epithelialized N/A N/A Wound Status: No N/A N/A Wound Recurrence: Yes N/A N/A Clustered Wound: 1 N/A N/A Clustered Quantity: 0x0x0 N/A N/A Measurements L x W x D (cm) 0 N/A N/A A (cm) : rea 0 N/A N/A Volume (cm) : 100.00% N/A N/A % Reduction in Area: 100.00% N/A N/A % Reduction in Volume: Full Thickness Without Exposed N/A N/A Classification: Support Structures Small N/A N/A Exudate Amount: Serosanguineous N/A N/A Exudate Type: red, brown N/A N/A Exudate Color: Well defined, not attached N/A N/A Wound Margin: Large (67-100%) N/A N/A Granulation Amount: Red N/A N/A Granulation Quality: None Present (0%) N/A N/A Necrotic Amount: Fat Layer (Subcutaneous Tissue): Yes N/A N/A Exposed Structures: Fascia: No Tendon: No Muscle: No Joint: No Bone: No Large (67-100%) N/A N/A Epithelialization: Excoriation: No N/A N/A Periwound Skin Texture: Induration: No Callus: No Crepitus: No Rash: No Scarring: No Maceration: No N/A N/A Periwound Skin Moisture: Dry/Scaly: No Atrophie Blanche: No N/A N/A Periwound Skin Color: Cyanosis: No Ecchymosis: No Erythema: No Hemosiderin Staining: No Mottled: No Pallor: No Rubor: No No Abnormality N/A N/A Temperature: Treatment  Notes Electronic Signature(s) Signed: 03/02/2022 4:32:49 PM By: Kalman Shan DO Signed: 03/02/2022 4:42:08 PM By: Deon Pilling RN, BSN Entered By: Kalman Shan on 03/02/2022 15:48:32 -------------------------------------------------------------------------------- Multi-Disciplinary Care Plan Details Patient Name: Date of Service: [name]. 03/02/2022 3:00 PM Medical Record Number: [id number] Patient Account Number: [id number] Date of Birth/Sex: Treating RN: [birth year] (88 y.o. [name] Primary Care Tamir Wallman: [name] Other Clinician: Referring Rigel Filsinger: Treating Simrit Gohlke/Extender: [name] in Treatment: 22 Active Inactive Electronic Signature(s) Signed: 03/02/2022 4:42:08 PM By: Deon Pilling RN, BSN Entered By: Deon Pilling on 03/02/2022 15:26:46 -------------------------------------------------------------------------------- Pain Assessment Details Patient Name: Date of Service: [name]. 03/02/2022 3:00 PM Medical Record Number: [id number] Patient Account Number: [id number] Date of Birth/Sex: Treating RN: 11-03-1933 (88 y.o. [name] Primary Care Raena Pau: [name] Other Clinician: Referring Prarthana Parlin: Treating Rashi Granier/Extender: [name] in Treatment: 22 Active Problems Location of Pain Severity and Description of Pain Patient Has Paino No Site Locations Rate the pain. Current Pain Level: 0 Pain Management and Medication Current Pain Management: Medication: No Cold Application: No Rest: No Massage: No Activity: No T.E.N.S.: No Heat Application: No Leg drop or elevation: No Is the Current Pain Management Adequate: Adequate How does your wound impact your activities of daily livingo Sleep: No Bathing: No Appetite: No Relationship With Others: No Bladder Continence: No Emotions: No Bowel Continence: No Work: No Toileting: No Drive: No Dressing:  No Hobbies: No Electronic Signature(s) Signed: 03/02/2022 4:42:08 PM By: Deon Pilling RN, BSN Entered By: Deon Pilling on 03/02/2022 15:04:27 -------------------------------------------------------------------------------- Patient/Caregiver Education Details Patient Name: Date of Service: [name] 10/2/2023andnbsp3:00 PM Medical Record Number: [id number] Patient Account Number: [id number] Date of Birth/Gender: Treating RN: 19-May-1934 (88 y.o. [name] Primary Care Physician: [name] Other Clinician: Referring Physician: Treating Physician/Extender: [name] in Treatment: 98 Education Assessment Education Provided To: Patient Education Topics Provided Wound/Skin Impairment: Handouts: Skin Care Do's and Dont's Methods: Explain/Verbal Responses: Reinforcements needed Electronic Signature(s) Signed: 03/02/2022 4:42:08 PM By: Deon Pilling RN, BSN Signed: 03/02/2022 4:42:08 PM By: Deon Pilling RN, BSN Entered By: Deon Pilling on 03/02/2022 14:38:11 -------------------------------------------------------------------------------- Wound Assessment Details Patient Name: Date of Service: [name]. 03/02/2022 3:00 PM Medical Record Number: [id number] Patient Account Number: [id number] Date of Birth/Sex: Treating RN: 04-24-1934 (88 y.o.  [name], Meta.Reding Primary Care Chirstopher Iovino: [name] Other Clinician: Referring Tiger Spieker: Treating Rozalynn Buege/Extender: [name] in Treatment: 22 Wound Status Wound Number: 1 Primary Abrasion Etiology: Wound Location: Right, Anterior Lower Leg Secondary Diabetic Wound/Ulcer of the Lower Extremity Wounding Event: Trauma Etiology: Date Acquired: 07/02/2021 Wound Healed - Epithelialized Weeks Of Treatment: 22 Status: Clustered Wound: Yes Comorbid Cataracts, Anemia, Asthma, Chronic Obstructive Pulmonary History: Disease (COPD), Congestive Heart Failure, Coronary  Artery Disease, Hypertension, Myocardial Infarction, Peripheral Arterial Disease, Type II Diabetes Photos Wound Measurements Length: (cm) Width: (cm) Depth: (cm) Clustered Quantity: Area: (cm) Volume: (cm) 0 % Reduction in Area: 100% 0 % Reduction in Volume: 100% 0 Epithelialization: Large (67-100%) 1 Tunneling: No 0 Undermining: No 0 Wound Description Classification: Full Thickness Without Exposed Support Structures Wound Margin: Well defined, not attached Exudate Amount: Small Exudate Type: Serosanguineous Exudate Color: red, brown Foul Odor After Cleansing: No Slough/Fibrino Yes Wound Bed Granulation Amount: Large (67-100%) Exposed Structure Granulation Quality: Red Fascia Exposed: No Necrotic Amount: None Present (0%) Fat Layer (Subcutaneous Tissue) Exposed: Yes Tendon Exposed: No Muscle Exposed: No Joint Exposed: No Bone Exposed: No Periwound Skin Texture Texture Color No Abnormalities Noted: Yes No Abnormalities Noted: Yes Moisture Temperature / Pain No Abnormalities Noted: Yes Temperature: No Abnormality Electronic Signature(s) Signed: 03/02/2022 4:42:08 PM By: Deon Pilling RN, BSN Entered By: Deon Pilling on 03/02/2022 15:21:52 -------------------------------------------------------------------------------- Vitals Details Patient Name: Date of Service: [name]. 03/02/2022 3:00 PM Medical Record Number: [id number] Patient Account Number: [id number] Date of Birth/Sex: Treating RN: 06-05-1933 (88 y.o. [name], [name] Primary Care Jetaun Colbath: [name] Other Clinician: Referring Ahmed Inniss: Treating Avnoor Koury/Extender: [name] in Treatment: 22 Vital Signs Time Taken: 15:03 Temperature (F): 97.9 Height (in): 69 Pulse (bpm): 54 Weight (lbs): 168 Respiratory Rate (breaths/min): 20 Body Mass Index (BMI): 24.8 Blood Pressure (mmHg): 144/64 Reference Range: 80 - 120 mg / dl Electronic Signature(s) Signed:  03/02/2022 4:42:08 PM By: Deon Pilling RN, BSN Entered By: Deon Pilling on 03/02/2022 15:06:17

## 2022-03-04 ENCOUNTER — Telehealth: Payer: Self-pay | Admitting: Internal Medicine

## 2022-03-04 NOTE — Telephone Encounter (Signed)
Patient's daughter called back, they do not wish to proceed with this.

## 2022-03-04 NOTE — Telephone Encounter (Signed)
Left message for patient to call back and schedule Medicare Annual Wellness Visit (AWV).   Please offer to do virtually or by telephone.  Left office number and my jabber #[phone].  AWVI eligible as of  06/01/2009  Please schedule at anytime with Nurse Health Advisor.   

## 2022-05-06 DIAGNOSIS — R0902 Hypoxemia: Secondary | ICD-10-CM | POA: Diagnosis not present

## 2022-05-06 DIAGNOSIS — G4733 Obstructive sleep apnea (adult) (pediatric): Secondary | ICD-10-CM | POA: Diagnosis not present

## 2022-05-06 DIAGNOSIS — J449 Chronic obstructive pulmonary disease, unspecified: Secondary | ICD-10-CM | POA: Diagnosis not present

## 2022-05-06 DIAGNOSIS — R0602 Shortness of breath: Secondary | ICD-10-CM | POA: Diagnosis not present

## 2022-05-06 DIAGNOSIS — J90 Pleural effusion, not elsewhere classified: Secondary | ICD-10-CM | POA: Diagnosis not present

## 2022-05-14 NOTE — Progress Notes (Signed)
Cardiology Office Note:    Date:  05/15/2022   ID:  [name], DOB 09/20/1933, MRN [id number]  PCP:  Janith Lima, MD   [facility] Prov[facility] Cardiologist:  [name], MD Electrophysiologist:  Cristopher Peru, MD     Referring MD: Janith Lima, MD   Chief Complaint  Patient presents with   Follow-up    CHF    History of Present Illness:    [name] is a 88 y.o. male with a hx of coronary artery disease and ischemic cardiomyopathy.  Recent EF 2018 was lower than previous at 25% and Entresto was titrated.  Unfortunately  he developed hypotension and entresto was D/C'ed. He also has not tolerated spironolactone or ARBs. Echo unchanged at LVEF25% and CRT-D upgrade was completed.  Last stress test in 2013 with large fixed scar.  He was last seen by Dr. Percival Spanish 10/2021.  He has a component of renal insufficiency and doesn't like taking lasix every day. He is unable to wear compression stockings due to prior wound. He underwent right common femoral and profunda femoris artery angioplasty and stenting 12/11/21 with [facility].   He presents today for routine follow up. He is here with his daughter. He is not eating well for fear of stool incontinence - he has dementia and does not have good bowel control. He is losing weight, but drinking protein shakes. He is not seeing GI. Daughter also describes trouble seeing and rapidly progressing dementia.   Main complaint is orthostatic dizziness, at times can't get out of the car. Med list reviewed and he is on high dose toprol. These episodes have subsided for the time being. I will hold off on BB changes for now but they are seeing Dr. [name] in Jan and will ask about reducing BB at that time.    Past Medical History:  Diagnosis Date   Atrial flutter ([place])    (I could not find documentation of this rhythm.)   AUTOMATIC IMPLANTABLE CARDIAC DEFIBRILLATOR SITU    Automatic implantable cardioverter-defibrillator in situ  12/05/2009   Qualifier: Diagnosis of  By: [name], MD, [facility], Binnie Kand    CAD (coronary artery disease) 05/22/2011   Carotid stenosis 05/22/2011   CHF CONGESTIVE HEART FAILURE    45% by echo 2015   COPD 12/01/2007   Qualifier: Diagnosis of  By: [name] NP, [name]     Dementia ([place])    per pt's stepdaughter   DM2 (diabetes mellitus, type 2) ([place])    pt's stepdaughter said he is no longer Diabetic, and does not take medication for it   DYSLIPIDEMIA    DYSPNEA    Edema    Essential hypertension 11/17/2007   Qualifier: Diagnosis of  By: [name]     GERD (gastroesophageal reflux disease)    Gout    HYPERTENSION    HYPOTHYROIDISM    MYOCARDIAL INFARCTION    MI 1999, stent x 2 to RCA, 70% circ, 30 and 40 % LADs   S/P updgrade to CRT-D 11/23/2020   WEIGHT GAIN, ABNORMAL     Past Surgical History:  Procedure Laterality Date   ANGIOPLASTY     stent   APPENDECTOMY  1957   BIV UPGRADE N/A 11/22/2020   Procedure: BIV ICD UPGRADE;  Surgeon: Evans Lance, MD;  Location: [place] [facility];  Service: Cardiovascular;  Laterality: N/A;   [place]  INSERTION OF ILIAC STENT N/A 12/11/2021   Procedure: LOWER EXTREMITY ANGIOGRAM WITH COMMON FEMORAL ARTERY AND PROFUNDA  ARTERY STENTING;  Surgeon: Cherre Robins, MD;  Location: [place];  Service: Vascular;  Laterality: N/A;   LOWER EXTREMITY ANGIOGRAPHY N/A 05/04/2018   Procedure: LOWER EXTREMITY ANGIOGRAPHY;  Surgeon: [name], MD;  Location: [place] [facility];  Service: Cardiovascular;  Laterality: N/A;   PERIPHERAL VASCULAR INTERVENTION Bilateral 05/04/2018   Procedure: PERIPHERAL VASCULAR INTERVENTION;  Surgeon: [name], MD;  Location: [place] [facility];  Service: Cardiovascular;  Laterality: Bilateral;  [place]   ULTRASOUND GUIDANCE FOR VASCULAR ACCESS Left 12/11/2021   Procedure: ULTRASOUND GUIDANCE FOR VASCULAR ACCESS;  Surgeon: Cherre Robins, MD;  [facility];  Service: Vascular;  Laterality: Left;    Current Medications: Current Meds  Medication Sig   acetaminophen (TYLENOL) 650 MG CR tablet Take 650 mg by mouth every 8 (eight) hours as needed for pain.   aspirin EC 81 MG tablet Take 81 mg by mouth in the morning. Swallow whole.   atorvastatin (LIPITOR) 40 MG tablet TAKE 1 TABLET (40 MG TOTAL) BY MOUTH IN THE MORNING.   clopidogrel (PLAVIX) 75 MG tablet TAKE 1 TABLET (75 MG TOTAL) BY MOUTH IN THE MORNING   famotidine (PEPCID) 40 MG tablet Take 40 mg by mouth 2 (two) times daily.   FLOVENT HFA 110 MCG/ACT inhaler Inhale 2 puffs into the lungs 2 (two) times daily.   furosemide (LASIX) 20 MG tablet Take 1 tablet (20 mg total) by mouth in the morning.   hydrALAZINE (APRESOLINE) 10 MG tablet Take 1 tablet (10 mg total) by mouth in the morning and at bedtime.   metoprolol (TOPROL-XL) 200 MG 24 hr tablet Take 0.5 tablets (100 mg total) by mouth in the morning and at bedtime.   mirtazapine (REMERON) 30 MG tablet TAKE 1 TABLET BY MOUTH EVERY DAY   montelukast (SINGULAIR) 10 MG tablet TAKE 1 TABLET BY MOUTH EVERY DAY   pantoprazole (PROTONIX) 40 MG tablet Take 40 mg by mouth 2 (two) times daily. Morning & Evening   Polyethyl Glycol-Propyl Glycol (SYSTANE OP) Place 1 drop into both eyes in the morning and at bedtime.   SYNTHROID 200 MCG tablet TAKE 1 TABLET (200 MCG TOTAL) BY MOUTH IN THE MORNING.     Allergies:   Prednisone and Sulfa antibiotics   Social History   Socioeconomic History   Marital status: Married    Spouse name: Ila   Number of children: 2   Years of education: Not on file   Highest education level: Not on file  Occupational History   Not on file  Tobacco Use   Smoking status: Former    Types: Cigarettes    Quit date: 12/10/1983    Years since quitting: 38.4   Smokeless tobacco: Never   Tobacco comments:    quit 1985  Vaping Use   Vaping Use: Never used  Substance and Sexual Activity   Alcohol use:  No   Drug use: No   Sexual activity: Not on file  Other Topics Concern   Not on file  Social History Narrative   1 stepdaughter, Mardene Celeste. 1 son who is not around and not in contact with his father. Pt's wife has alzheimers   Social Determinants of Radio broadcast assistant Strain: Not on file  Food Insecurity: Not on file  Transportation Needs: Not on file  Physical Activity: Not on file  Stress: Not on  file  Social Connections: Not on file     Family History: The patient's family history includes Cancer in his brother; Kidney disease in his father; Other in his mother.  ROS:   Please see the history of present illness.     All other systems reviewed and are negative.  EKGs/Labs/Other Studies Reviewed:    The following studies were reviewed today:  Echo 2019 Study Conclusions   - Left ventricle: The cavity size was mildly dilated. Systolic    function was severely reduced. The estimated ejection fraction    was in the range of 25% to 30%. Severe diffuse hypokinesis with    distinct regional wall motion abnormalities. There is akinesis of    the basalinferoseptal myocardium. There is akinesis of the    inferior myocardium. There was an increased relative contribution    of atrial contraction to ventricular filling. Doppler parameters    are consistent with abnormal left ventricular relaxation (grade 1    diastolic dysfunction).  - Ventricular septum: Septal motion showed moderate paradox. These    changes are consistent with intraventricular conduction delay.  - Aortic valve: Trileaflet; mildly thickened, moderately calcified    leaflets.  - Mitral valve: There was trivial regurgitation.  - Left atrium: The atrium was mildly dilated.  - Right ventricle: Pacer wire or catheter noted in right ventricle.  - Tricuspid valve: There was trivial regurgitation.   EKG:  EKG is not ordered today.    Recent Labs: 11/28/2021: ALT 8; BUN 27; Creatinine, Ser 1.71; Hemoglobin  9.2; Platelets 247; Potassium 4.6; Sodium 140  Recent Lipid Panel    Component Value Date/Time   CHOL 112 04/30/2021 1304   TRIG 69 04/30/2021 1304   HDL 41 04/30/2021 1304   CHOLHDL 2.7 04/30/2021 1304   CHOLHDL 4 06/22/2011 0925   VLDL 25.0 06/22/2011 0925   LDLCALC 56 04/30/2021 1304     Risk Assessment/Calculations:                Physical Exam:    VS:  BP (!) 130/49   Pulse 65   Ht '5\' 7"'$  (1.702 m)   Wt 152 lb 6.4 oz (69.1 kg)   BMI 23.87 kg/m     Wt Readings from Last 3 Encounters:  05/15/22 152 lb 6.4 oz (69.1 kg)  01/13/22 152 lb (68.9 kg)  12/11/21 155 lb (70.3 kg)     GEN:  Thin frail male in NAD HEENT: Normal NECK: No JVD; No carotid bruits LYMPHATICS: No lymphadenopathy CARDIAC: RRR, no murmurs, rubs, gallops RESPIRATORY:  Clear to auscultation without rales, wheezing or rhonchi  ABDOMEN: Soft, non-tender, non-distended MUSCULOSKELETAL:  B LE edema; No deformity  SKIN: Warm and dry NEUROLOGIC:  Alert and oriented x 3 PSYCHIATRIC:  Normal affect   ASSESSMENT:    1. Coronary artery disease involving native coronary artery of native heart without angina pectoris   2. Essential hypertension   3. Incontinence of feces, unspecified fecal incontinence type   4. Hyperlipidemia with target LDL less than 70   5. PAD (peripheral artery disease) ([place])   6. Orthostatic dizziness   7. Weight loss    PLAN:    In order of problems listed above:  CAD Hyperlipidemia ASA, plavix, toprol 100 mg BID, imdur, hydralazine   Chronic systolic heart failure / ischemic cardiomyopathy Hypertension CKD stage II-III GDMT complicated by hypotension No medication changes   Orthostatic dizziness Daughter says this has been better lately Would have a low threshold to  reduce toprol dose from 100 mg BID to 50 mg BID vs 100 mg daily - they will discuss with Dr. [name] in Jan   PAD Intervention 11/2021 [facility]   Poor vision Seeing eye doctor in  [place]   Weight loss Stool incontinence Will refer to [facility] for evaluation   Follow up in 6 months.   Medication Adjustments/Labs and Tests Ordered: Current medicines are reviewed at length with the patient today.  Concerns regarding medicines are outlined above.  Orders Placed This Encounter  Procedures   Ambulatory referral to Gastroenterology   No orders of the defined types were placed in this encounter.   Patient Instructions  Medication Instructions:  Continue same medications *If you need a refill on your cardiac medications before your next appointment, please call your pharmacy*   Lab Work: None ordered   Testing/Procedures: None ordered   Follow-Up: At [facility], you and your health needs are our priority.  As part of our continuing mission to provide you with exceptional heart care, we have created designated Provider Care Teams.  These Care Teams include your primary Cardiologist (physician) and Advanced Practice Providers (APPs -  Physician Assistants and Nurse Practitioners) who all work together to provide you with the care you need, when you need it.  We recommend signing up for the patient portal called "MyChart".  Sign up information is provided on this After Visit Summary.  MyChart is used to connect with patients for Virtual Visits (Telemedicine).  Patients are able to view lab/test results, encounter notes, upcoming appointments, etc.  Non-urgent messages can be sent to your provider as well.   To learn more about what you can do with MyChart, go to [email or web address].    Your next appointment:  6 months    Call in March to schedule June appointment     The format for your next appointment: Office     Provider:  Dr.Hochrein  [facility] will call with appointment  Check with Dr.Taylor about Metoprolol dose   Important Information About Sugar         Signed, Ledora Bottcher, PA  05/15/2022 3:42 PM    [place]

## 2022-05-15 ENCOUNTER — Encounter: Payer: Self-pay | Admitting: Physician Assistant

## 2022-05-15 ENCOUNTER — Ambulatory Visit: Payer: Medicare Other | Attending: Physician Assistant | Admitting: Physician Assistant

## 2022-05-15 VITALS — BP 130/49 | HR 65 | Ht 67.0 in | Wt 152.4 lb

## 2022-05-15 DIAGNOSIS — I2510 Atherosclerotic heart disease of native coronary artery without angina pectoris: Secondary | ICD-10-CM | POA: Diagnosis not present

## 2022-05-15 DIAGNOSIS — I10 Essential (primary) hypertension: Secondary | ICD-10-CM

## 2022-05-15 DIAGNOSIS — R634 Abnormal weight loss: Secondary | ICD-10-CM | POA: Diagnosis not present

## 2022-05-15 DIAGNOSIS — E785 Hyperlipidemia, unspecified: Secondary | ICD-10-CM

## 2022-05-15 DIAGNOSIS — R42 Dizziness and giddiness: Secondary | ICD-10-CM

## 2022-05-15 DIAGNOSIS — R159 Full incontinence of feces: Secondary | ICD-10-CM | POA: Diagnosis not present

## 2022-05-15 DIAGNOSIS — I739 Peripheral vascular disease, unspecified: Secondary | ICD-10-CM | POA: Diagnosis not present

## 2022-05-15 NOTE — Patient Instructions (Signed)
Medication Instructions:  Continue same medications *If you need a refill on your cardiac medications before your next appointment, please call your pharmacy*   Lab Work: None ordered   Testing/Procedures: None ordered   Follow-Up: At [facility], you and your health needs are our priority.  As part of our continuing mission to provide you with exceptional heart care, we have created designated Provider Care Teams.  These Care Teams include your primary Cardiologist (physician) and Advanced Practice Providers (APPs -  Physician Assistants and Nurse Practitioners) who all work together to provide you with the care you need, when you need it.  We recommend signing up for the patient portal called "MyChart".  Sign up information is provided on this After Visit Summary.  MyChart is used to connect with patients for Virtual Visits (Telemedicine).  Patients are able to view lab/test results, encounter notes, upcoming appointments, etc.  Non-urgent messages can be sent to your provider as well.   To learn more about what you can do with MyChart, go to [email or web address].    Your next appointment:  6 months    Call in March to schedule June appointment     The format for your next appointment: Office     Provider:  Dr.Hochrein  [facility] will call with appointment  Check with Dr.Taylor about Metoprolol dose   Important Information About Sugar

## 2022-05-16 ENCOUNTER — Other Ambulatory Visit: Payer: Self-pay | Admitting: Internal Medicine

## 2022-05-16 DIAGNOSIS — E038 Other specified hypothyroidism: Secondary | ICD-10-CM

## 2022-05-18 ENCOUNTER — Other Ambulatory Visit: Payer: Self-pay | Admitting: Internal Medicine

## 2022-05-18 DIAGNOSIS — E038 Other specified hypothyroidism: Secondary | ICD-10-CM

## 2022-05-22 ENCOUNTER — Ambulatory Visit (INDEPENDENT_AMBULATORY_CARE_PROVIDER_SITE_OTHER): Payer: Medicare Other

## 2022-05-22 DIAGNOSIS — I255 Ischemic cardiomyopathy: Secondary | ICD-10-CM

## 2022-05-22 LAB — CUP PACEART REMOTE DEVICE CHECK
Battery Remaining Longevity: 102 mo
Battery Remaining Percentage: 100 %
Brady Statistic RA Percent Paced: 2 %
Brady Statistic RV Percent Paced: 100 %
Date Time Interrogation Session: 20231222024200
HighPow Impedance: 44 Ohm
Implantable Lead Connection Status: 753985
Implantable Lead Connection Status: 753985
Implantable Lead Connection Status: 753985
Implantable Lead Connection Status: 753985
Implantable Lead Implant Date: 20020206
Implantable Lead Implant Date: 20071005
Implantable Lead Implant Date: 20220624
Implantable Lead Implant Date: 20220624
Implantable Lead Location: 753858
Implantable Lead Location: 753859
Implantable Lead Location: 753860
Implantable Lead Location: 753860
Implantable Lead Model: 148
Implantable Lead Model: 4088
Implantable Lead Model: 4677
Implantable Lead Model: 7841
Implantable Lead Serial Number: 1073796
Implantable Lead Serial Number: 109512
Implantable Lead Serial Number: 224392
Implantable Lead Serial Number: 824442
Implantable Pulse Generator Implant Date: 20220624
Lead Channel Impedance Value: 459 Ohm
Lead Channel Impedance Value: 575 Ohm
Lead Channel Impedance Value: 673 Ohm
Lead Channel Pacing Threshold Amplitude: 0.7 V
Lead Channel Pacing Threshold Pulse Width: 0.4 ms
Lead Channel Setting Pacing Amplitude: 2 V
Lead Channel Setting Pacing Amplitude: 2.5 V
Lead Channel Setting Pacing Amplitude: 3 V
Lead Channel Setting Pacing Pulse Width: 0.4 ms
Lead Channel Setting Pacing Pulse Width: 1.2 ms
Lead Channel Setting Sensing Sensitivity: 0.5 mV
Lead Channel Setting Sensing Sensitivity: 1 mV
Pulse Gen Serial Number: 385552

## 2022-06-07 DIAGNOSIS — S41112A Laceration without foreign body of left upper arm, initial encounter: Secondary | ICD-10-CM | POA: Diagnosis not present

## 2022-06-07 DIAGNOSIS — W1809XA Striking against other object with subsequent fall, initial encounter: Secondary | ICD-10-CM | POA: Diagnosis not present

## 2022-06-07 DIAGNOSIS — R58 Hemorrhage, not elsewhere classified: Secondary | ICD-10-CM | POA: Diagnosis not present

## 2022-06-07 DIAGNOSIS — Z743 Need for continuous supervision: Secondary | ICD-10-CM | POA: Diagnosis not present

## 2022-06-07 DIAGNOSIS — S51812A Laceration without foreign body of left forearm, initial encounter: Secondary | ICD-10-CM | POA: Diagnosis not present

## 2022-06-07 DIAGNOSIS — E119 Type 2 diabetes mellitus without complications: Secondary | ICD-10-CM | POA: Diagnosis not present

## 2022-06-07 DIAGNOSIS — Z23 Encounter for immunization: Secondary | ICD-10-CM | POA: Diagnosis not present

## 2022-06-07 DIAGNOSIS — D649 Anemia, unspecified: Secondary | ICD-10-CM | POA: Diagnosis not present

## 2022-06-11 ENCOUNTER — Encounter (HOSPITAL_COMMUNITY): Payer: Self-pay

## 2022-06-11 ENCOUNTER — Other Ambulatory Visit: Payer: Self-pay

## 2022-06-11 ENCOUNTER — Inpatient Hospital Stay (HOSPITAL_COMMUNITY)
Admission: EM | Admit: 2022-06-11 | Discharge: 2022-06-13 | DRG: 812 | Disposition: A | Discharge status: Home / Self Care | Payer: Medicare Other | Source: Ambulatory Visit | Attending: Internal Medicine | Admitting: Internal Medicine

## 2022-06-11 ENCOUNTER — Telehealth: Payer: Self-pay

## 2022-06-11 ENCOUNTER — Ambulatory Visit (INDEPENDENT_AMBULATORY_CARE_PROVIDER_SITE_OTHER): Payer: Medicare Other | Admitting: Family Medicine

## 2022-06-11 VITALS — BP 128/70 | HR 70 | Temp 97.6°F | Ht 67.0 in | Wt 152.0 lb

## 2022-06-11 DIAGNOSIS — R58 Hemorrhage, not elsewhere classified: Secondary | ICD-10-CM

## 2022-06-11 DIAGNOSIS — Z79899 Other long term (current) drug therapy: Secondary | ICD-10-CM | POA: Diagnosis not present

## 2022-06-11 DIAGNOSIS — D649 Anemia, unspecified: Secondary | ICD-10-CM | POA: Diagnosis not present

## 2022-06-11 DIAGNOSIS — E038 Other specified hypothyroidism: Secondary | ICD-10-CM

## 2022-06-11 DIAGNOSIS — I4891 Unspecified atrial fibrillation: Secondary | ICD-10-CM | POA: Diagnosis not present

## 2022-06-11 DIAGNOSIS — Z808 Family history of malignant neoplasm of other organs or systems: Secondary | ICD-10-CM

## 2022-06-11 DIAGNOSIS — J4489 Other specified chronic obstructive pulmonary disease: Secondary | ICD-10-CM | POA: Diagnosis present

## 2022-06-11 DIAGNOSIS — E119 Type 2 diabetes mellitus without complications: Secondary | ICD-10-CM | POA: Diagnosis not present

## 2022-06-11 DIAGNOSIS — Z66 Do not resuscitate: Secondary | ICD-10-CM | POA: Diagnosis not present

## 2022-06-11 DIAGNOSIS — Z87891 Personal history of nicotine dependence: Secondary | ICD-10-CM | POA: Diagnosis not present

## 2022-06-11 DIAGNOSIS — K219 Gastro-esophageal reflux disease without esophagitis: Secondary | ICD-10-CM | POA: Diagnosis present

## 2022-06-11 DIAGNOSIS — F03A3 Unspecified dementia, mild, with mood disturbance: Secondary | ICD-10-CM | POA: Diagnosis present

## 2022-06-11 DIAGNOSIS — I6529 Occlusion and stenosis of unspecified carotid artery: Secondary | ICD-10-CM | POA: Diagnosis not present

## 2022-06-11 DIAGNOSIS — T39015A Adverse effect of aspirin, initial encounter: Secondary | ICD-10-CM | POA: Diagnosis not present

## 2022-06-11 DIAGNOSIS — I252 Old myocardial infarction: Secondary | ICD-10-CM | POA: Diagnosis not present

## 2022-06-11 DIAGNOSIS — I5042 Chronic combined systolic (congestive) and diastolic (congestive) heart failure: Secondary | ICD-10-CM | POA: Diagnosis not present

## 2022-06-11 DIAGNOSIS — I959 Hypotension, unspecified: Secondary | ICD-10-CM | POA: Diagnosis not present

## 2022-06-11 DIAGNOSIS — E039 Hypothyroidism, unspecified: Secondary | ICD-10-CM | POA: Diagnosis not present

## 2022-06-11 DIAGNOSIS — I5022 Chronic systolic (congestive) heart failure: Secondary | ICD-10-CM | POA: Diagnosis present

## 2022-06-11 DIAGNOSIS — H9190 Unspecified hearing loss, unspecified ear: Secondary | ICD-10-CM | POA: Diagnosis not present

## 2022-06-11 DIAGNOSIS — Z95820 Peripheral vascular angioplasty status with implants and grafts: Secondary | ICD-10-CM | POA: Diagnosis not present

## 2022-06-11 DIAGNOSIS — E785 Hyperlipidemia, unspecified: Secondary | ICD-10-CM | POA: Diagnosis not present

## 2022-06-11 DIAGNOSIS — N179 Acute kidney failure, unspecified: Secondary | ICD-10-CM | POA: Diagnosis present

## 2022-06-11 DIAGNOSIS — I4892 Unspecified atrial flutter: Secondary | ICD-10-CM | POA: Diagnosis not present

## 2022-06-11 DIAGNOSIS — I951 Orthostatic hypotension: Secondary | ICD-10-CM | POA: Diagnosis present

## 2022-06-11 DIAGNOSIS — R42 Dizziness and giddiness: Secondary | ICD-10-CM

## 2022-06-11 DIAGNOSIS — I110 Hypertensive heart disease with heart failure: Secondary | ICD-10-CM | POA: Diagnosis not present

## 2022-06-11 DIAGNOSIS — I1A0 Resistant hypertension: Secondary | ICD-10-CM | POA: Diagnosis not present

## 2022-06-11 DIAGNOSIS — K5900 Constipation, unspecified: Secondary | ICD-10-CM | POA: Diagnosis present

## 2022-06-11 DIAGNOSIS — R001 Bradycardia, unspecified: Secondary | ICD-10-CM | POA: Diagnosis not present

## 2022-06-11 DIAGNOSIS — T45525A Adverse effect of antithrombotic drugs, initial encounter: Secondary | ICD-10-CM | POA: Diagnosis not present

## 2022-06-11 DIAGNOSIS — Z888 Allergy status to other drugs, medicaments and biological substances status: Secondary | ICD-10-CM

## 2022-06-11 DIAGNOSIS — M7989 Other specified soft tissue disorders: Secondary | ICD-10-CM | POA: Diagnosis present

## 2022-06-11 DIAGNOSIS — Z79890 Hormone replacement therapy (postmenopausal): Secondary | ICD-10-CM

## 2022-06-11 DIAGNOSIS — D500 Iron deficiency anemia secondary to blood loss (chronic): Secondary | ICD-10-CM | POA: Diagnosis not present

## 2022-06-11 DIAGNOSIS — I459 Conduction disorder, unspecified: Secondary | ICD-10-CM | POA: Diagnosis present

## 2022-06-11 DIAGNOSIS — Z841 Family history of disorders of kidney and ureter: Secondary | ICD-10-CM

## 2022-06-11 DIAGNOSIS — Z882 Allergy status to sulfonamides status: Secondary | ICD-10-CM

## 2022-06-11 DIAGNOSIS — I6523 Occlusion and stenosis of bilateral carotid arteries: Secondary | ICD-10-CM

## 2022-06-11 DIAGNOSIS — S51802D Unspecified open wound of left forearm, subsequent encounter: Secondary | ICD-10-CM

## 2022-06-11 DIAGNOSIS — Z95810 Presence of automatic (implantable) cardiac defibrillator: Secondary | ICD-10-CM

## 2022-06-11 DIAGNOSIS — I739 Peripheral vascular disease, unspecified: Secondary | ICD-10-CM

## 2022-06-11 DIAGNOSIS — J45909 Unspecified asthma, uncomplicated: Secondary | ICD-10-CM | POA: Diagnosis present

## 2022-06-11 DIAGNOSIS — I10 Essential (primary) hypertension: Secondary | ICD-10-CM | POA: Diagnosis present

## 2022-06-11 DIAGNOSIS — Z955 Presence of coronary angioplasty implant and graft: Secondary | ICD-10-CM

## 2022-06-11 DIAGNOSIS — I2510 Atherosclerotic heart disease of native coronary artery without angina pectoris: Secondary | ICD-10-CM | POA: Diagnosis present

## 2022-06-11 DIAGNOSIS — Z7902 Long term (current) use of antithrombotics/antiplatelets: Secondary | ICD-10-CM

## 2022-06-11 DIAGNOSIS — M109 Gout, unspecified: Secondary | ICD-10-CM | POA: Diagnosis present

## 2022-06-11 DIAGNOSIS — E063 Autoimmune thyroiditis: Secondary | ICD-10-CM | POA: Diagnosis not present

## 2022-06-11 DIAGNOSIS — Z7982 Long term (current) use of aspirin: Secondary | ICD-10-CM

## 2022-06-11 LAB — CBC WITH DIFFERENTIAL/PLATELET
Abs Immature Granulocytes: 0.07 10*3/uL (ref 0.00–0.07)
Basophils Absolute: 0.1 10*3/uL (ref 0.0–0.1)
Basophils Absolute: 0.1 10*3/uL (ref 0.0–0.1)
Basophils Relative: 2.6 % (ref 0.0–3.0)
Basophils Relative: 1 %
Eosinophils Absolute: 0.3 10*3/uL (ref 0.0–0.7)
Eosinophils Absolute: 0.2 10*3/uL (ref 0.0–0.5)
Eosinophils Relative: 4.5 % (ref 0.0–5.0)
Eosinophils Relative: 3 %
HCT: 24.3 % — ABNORMAL LOW (ref 39.0–52.0)
HCT: 24.8 % — ABNORMAL LOW (ref 39.0–52.0)
Hemoglobin: 7.9 g/dL — CL (ref 13.0–17.0)
Hemoglobin: 7.4 g/dL — ABNORMAL LOW (ref 13.0–17.0)
Immature Granulocytes: 1 %
Lymphocytes Relative: 34.7 % (ref 12.0–46.0)
Lymphocytes Relative: 33 %
Lymphs Abs: 2 10*3/uL (ref 0.7–4.0)
Lymphs Abs: 1.9 10*3/uL (ref 0.7–4.0)
MCH: 28.2 pg (ref 26.0–34.0)
MCHC: 32.5 g/dL (ref 30.0–36.0)
MCHC: 29.8 g/dL — ABNORMAL LOW (ref 30.0–36.0)
MCV: 89.9 fl (ref 78.0–100.0)
MCV: 94.7 fL (ref 80.0–100.0)
Monocytes Absolute: 0.3 10*3/uL (ref 0.1–1.0)
Monocytes Absolute: 0.3 10*3/uL (ref 0.1–1.0)
Monocytes Relative: 6 % (ref 3.0–12.0)
Monocytes Relative: 4 %
Neutro Abs: 3 10*3/uL (ref 1.4–7.7)
Neutro Abs: 3.4 10*3/uL (ref 1.7–7.7)
Neutrophils Relative %: 52.2 % (ref 43.0–77.0)
Neutrophils Relative %: 58 %
Platelets: 322 10*3/uL (ref 150.0–400.0)
Platelets: 300 10*3/uL (ref 150–400)
RBC: 2.7 Mil/uL — ABNORMAL LOW (ref 4.22–5.81)
RBC: 2.62 MIL/uL — ABNORMAL LOW (ref 4.22–5.81)
RDW: 24.6 % — ABNORMAL HIGH (ref 11.5–15.5)
RDW: 23.6 % — ABNORMAL HIGH (ref 11.5–15.5)
WBC: 5.7 10*3/uL (ref 4.0–10.5)
WBC: 5.9 10*3/uL (ref 4.0–10.5)
nRBC: 0 % (ref 0.0–0.2)

## 2022-06-11 LAB — COMPREHENSIVE METABOLIC PANEL
ALT: 8 U/L (ref 0–44)
AST: 19 U/L (ref 15–41)
Albumin: 3.6 g/dL (ref 3.5–5.0)
Alkaline Phosphatase: 44 U/L (ref 38–126)
Anion gap: 9 (ref 5–15)
BUN: 35 mg/dL — ABNORMAL HIGH (ref 8–23)
CO2: 24 mmol/L (ref 22–32)
Calcium: 8.6 mg/dL — ABNORMAL LOW (ref 8.9–10.3)
Chloride: 103 mmol/L (ref 98–111)
Creatinine, Ser: 2.21 mg/dL — ABNORMAL HIGH (ref 0.61–1.24)
GFR, Estimated: 28 mL/min — ABNORMAL LOW (ref >60)
Glucose, Bld: 104 mg/dL — ABNORMAL HIGH (ref 70–99)
Potassium: 4.2 mmol/L (ref 3.5–5.1)
Sodium: 136 mmol/L (ref 135–145)
Total Bilirubin: 0.9 mg/dL (ref 0.3–1.2)
Total Protein: 7.9 g/dL (ref 6.5–8.1)

## 2022-06-11 LAB — PROTIME-INR
INR: 1.3 ratio — ABNORMAL HIGH (ref 0.8–1.0)
Prothrombin Time: 14.3 s — ABNORMAL HIGH (ref 9.6–13.1)

## 2022-06-11 LAB — PREPARE RBC (CROSSMATCH)

## 2022-06-11 LAB — APTT: aPTT: 32.4 s (ref 25.4–36.8)

## 2022-06-11 MED ORDER — SODIUM CHLORIDE 0.9 % IV BOLUS
500.0000 mL | Freq: Once | INTRAVENOUS | Status: AC
  Administered 2022-06-11: 500 mL via INTRAVENOUS

## 2022-06-11 MED ORDER — MONTELUKAST SODIUM 10 MG PO TABS
10.0000 mg | ORAL_TABLET | Freq: Every day | ORAL | Status: DC
  Administered 2022-06-11 – 2022-06-12 (×2): 10 mg via ORAL
  Filled 2022-06-11 (×2): qty 1

## 2022-06-11 MED ORDER — HYDRALAZINE HCL 10 MG PO TABS
10.0000 mg | ORAL_TABLET | Freq: Two times a day (BID) | ORAL | Status: DC
  Administered 2022-06-11: 10 mg via ORAL
  Filled 2022-06-11: qty 1

## 2022-06-11 MED ORDER — SODIUM CHLORIDE 0.45 % IV SOLN: INTRAVENOUS | Status: DC

## 2022-06-11 MED ORDER — FAMOTIDINE 20 MG PO TABS
20.0000 mg | ORAL_TABLET | Freq: Every day | ORAL | Status: DC
  Administered 2022-06-11 – 2022-06-12 (×2): 20 mg via ORAL
  Filled 2022-06-11 (×2): qty 1

## 2022-06-11 MED ORDER — ORAL CARE MOUTH RINSE: 15.0000 mL | OROMUCOSAL | Status: DC | PRN

## 2022-06-11 MED ORDER — SYNTHROID 200 MCG PO TABS: 200.0000 ug | ORAL_TABLET | Freq: Every morning | ORAL | 0 refills | Status: DC

## 2022-06-11 MED ORDER — SODIUM CHLORIDE 0.9% IV SOLUTION: Freq: Once | INTRAVENOUS | Status: AC

## 2022-06-11 MED ORDER — MIRTAZAPINE 15 MG PO TABS
30.0000 mg | ORAL_TABLET | Freq: Every day | ORAL | Status: DC
  Administered 2022-06-12: 30 mg via ORAL
  Filled 2022-06-11: qty 2

## 2022-06-11 MED ORDER — FUROSEMIDE 20 MG PO TABS: 20.0000 mg | ORAL_TABLET | Freq: Every morning | ORAL | Status: DC

## 2022-06-11 MED ORDER — ISOSORBIDE MONONITRATE ER 30 MG PO TB24
30.0000 mg | ORAL_TABLET | Freq: Every day | ORAL | Status: DC
  Administered 2022-06-11 – 2022-06-13 (×3): 30 mg via ORAL
  Filled 2022-06-11 (×3): qty 1

## 2022-06-11 MED ORDER — LEVOTHYROXINE SODIUM 100 MCG PO TABS
200.0000 ug | ORAL_TABLET | Freq: Every morning | ORAL | Status: DC
  Administered 2022-06-12: 200 ug via ORAL
  Filled 2022-06-11 (×2): qty 2

## 2022-06-11 MED ORDER — ATORVASTATIN CALCIUM 40 MG PO TABS
40.0000 mg | ORAL_TABLET | Freq: Every day | ORAL | Status: DC
  Administered 2022-06-12 – 2022-06-13 (×2): 40 mg via ORAL
  Filled 2022-06-11 (×2): qty 1

## 2022-06-11 MED ORDER — PANTOPRAZOLE SODIUM 40 MG PO TBEC
40.0000 mg | DELAYED_RELEASE_TABLET | Freq: Two times a day (BID) | ORAL | Status: DC
  Administered 2022-06-11 – 2022-06-13 (×4): 40 mg via ORAL
  Filled 2022-06-11 (×4): qty 1

## 2022-06-11 MED ORDER — METOPROLOL SUCCINATE ER 50 MG PO TB24
100.0000 mg | ORAL_TABLET | Freq: Two times a day (BID) | ORAL | Status: DC
  Administered 2022-06-11 – 2022-06-12 (×2): 100 mg via ORAL
  Filled 2022-06-11 (×2): qty 2

## 2022-06-11 NOTE — Progress Notes (Signed)
Subjective:     Patient ID: [name], male    DOB: 08-Sep-1933, 89 y.o.   MRN: [id number]  Chief Complaint  Patient presents with   Laceration    2 weeks ago, cut his arm and was unable to stop bleeding so went to ED on Sunday in [place], had to [place] a vein. Is anemic so is worried about the loss of blood and effects from this. Has been very dizzy since then but really bad within the last week.    HPI Patient is in today for bleeding from his left arm x 2 weeks. States he hit him arm on a butcher block in his kitchen.  C/o dizziness, fatigue, shortness of breath.  Family member reports patient went by EMS to [facility] ED for severe bleeding. Left arm was cauterized. Since then, there has been a slower bleed but patient has become more symptomatic.   Taking Plavix. Aspirin has been held per family but gave it last night.    Daughter reports patient has been out of levothyroxine x 1 wk.   Health Maintenance Due  Topic Date Due   Medicare Annual Wellness (AWV)  Never done   HEMOGLOBIN A1C  06/16/1934   FOOT EXAM  Never done   OPHTHALMOLOGY EXAM  Never done   Zoster Vaccines- Shingrix (1 of 2) Never done   COVID-19 Vaccine (5 - 2023-24 season) 01/30/2022    Past Medical History:  Diagnosis Date   Atrial flutter ([place])    (I could not find documentation of this rhythm.)   AUTOMATIC IMPLANTABLE CARDIAC DEFIBRILLATOR SITU    Automatic implantable cardioverter-defibrillator in situ 12/05/2009   Qualifier: Diagnosis of  By: [name], MD, [facility], Binnie Kand    CAD (coronary artery disease) 05/22/2011   Carotid stenosis 05/22/2011   CHF CONGESTIVE HEART FAILURE    45% by echo 2015   COPD 12/01/2007   Qualifier: Diagnosis of  By: [name] NP, [name]     Dementia ([place])    per pt's stepdaughter   DM2 (diabetes mellitus, type 2) ([place])    pt's stepdaughter said he is no longer Diabetic, and does not take medication for it   DYSLIPIDEMIA    DYSPNEA    Edema    Essential  hypertension 11/17/2007   Qualifier: Diagnosis of  By: [name]     GERD (gastroesophageal reflux disease)    Gout    HYPERTENSION    HYPOTHYROIDISM    MYOCARDIAL INFARCTION    MI 1999, stent x 2 to RCA, 70% circ, 30 and 40 % LADs   S/P updgrade to CRT-D 11/23/2020   WEIGHT GAIN, ABNORMAL     Past Surgical History:  Procedure Laterality Date   ANGIOPLASTY     stent   APPENDECTOMY  1957   BIV UPGRADE N/A 11/22/2020   Procedure: BIV ICD UPGRADE;  Surgeon: Evans Lance, MD;  Location: [place] [facility];  Service: Cardiovascular;  Laterality: N/A;   CARDIAC DEFIBRILLATOR PLACEMENT     CAROTID STENT     EYE SURGERY  1985   INSERTION OF ILIAC STENT N/A 12/11/2021   Procedure: LOWER EXTREMITY ANGIOGRAM WITH COMMON FEMORAL ARTERY AND PROFUNDA  ARTERY STENTING;  Surgeon: Cherre Robins, MD;  Location: [facility];  Service: Vascular;  Laterality: N/A;   LOWER EXTREMITY ANGIOGRAPHY N/A 05/04/2018   Procedure: LOWER EXTREMITY ANGIOGRAPHY;  Surgeon: [name], MD;  Location: [place] [facility];  Service: Cardiovascular;  Laterality: N/A;  PERIPHERAL VASCULAR INTERVENTION Bilateral 05/04/2018   Procedure: PERIPHERAL VASCULAR INTERVENTION;  Surgeon: [name], MD;  Location: [place] [facility];  Service: Cardiovascular;  Laterality: Bilateral;  [place]   ULTRASOUND GUIDANCE FOR VASCULAR ACCESS Left 12/11/2021   Procedure: ULTRASOUND GUIDANCE FOR VASCULAR ACCESS;  Surgeon: Cherre Robins, MD;  [facility]: [facility];  Service: Vascular;  Laterality: Left;    Family History  Problem Relation Age of Onset   Other Mother        natural causes   Kidney disease Father    Cancer Brother        throat cancer    Social History   Socioeconomic History   Marital status: Married    Spouse name: Ila   Number of children: 2   Years of education: Not on file   Highest education level: Not on file  Occupational History   Not on file  Tobacco Use   Smoking status: Former     Types: Cigarettes    Quit date: 12/10/1983    Years since quitting: 38.5   Smokeless tobacco: Never   Tobacco comments:    quit 1985  Vaping Use   Vaping Use: Never used  Substance and Sexual Activity   Alcohol use: No   Drug use: No   Sexual activity: Not on file  Other Topics Concern   Not on file  Social History Narrative   1 stepdaughter, Mardene Celeste. 1 son who is not around and not in contact with his father. Pt's wife has alzheimers   Social Determinants of Radio broadcast assistant Strain: Not on file  Food Insecurity: Not on file  Transportation Needs: Not on file  Physical Activity: Not on file  Stress: Not on file  Social Connections: Not on file  Intimate Partner Violence: Not on file    Outpatient Medications Prior to Visit  Medication Sig Dispense Refill   acetaminophen (TYLENOL) 650 MG CR tablet Take 650 mg by mouth every 8 (eight) hours as needed for pain.     aspirin EC 81 MG tablet Take 81 mg by mouth in the morning. Swallow whole.     atorvastatin (LIPITOR) 40 MG tablet TAKE 1 TABLET (40 MG TOTAL) BY MOUTH IN THE MORNING. 90 tablet 3   clopidogrel (PLAVIX) 75 MG tablet TAKE 1 TABLET (75 MG TOTAL) BY MOUTH IN THE MORNING 90 tablet 1   famotidine (PEPCID) 40 MG tablet Take 40 mg by mouth 2 (two) times daily.     FLOVENT HFA 110 MCG/ACT inhaler Inhale 2 puffs into the lungs 2 (two) times daily.  3   furosemide (LASIX) 20 MG tablet Take 1 tablet (20 mg total) by mouth in the morning. 90 tablet 3   hydrALAZINE (APRESOLINE) 10 MG tablet Take 1 tablet (10 mg total) by mouth in the morning and at bedtime. 180 tablet 3   metoprolol (TOPROL-XL) 200 MG 24 hr tablet Take 0.5 tablets (100 mg total) by mouth in the morning and at bedtime. 90 tablet 3   mirtazapine (REMERON) 30 MG tablet TAKE 1 TABLET BY MOUTH EVERY DAY 90 tablet 1   montelukast (SINGULAIR) 10 MG tablet TAKE 1 TABLET BY MOUTH EVERY DAY 90 tablet 1   pantoprazole (PROTONIX) 40 MG tablet Take 40 mg by mouth 2  (two) times daily. Morning & Evening     Polyethyl Glycol-Propyl Glycol (SYSTANE OP) Place 1 drop into both eyes in the morning and at bedtime.     SYNTHROID 200  MCG tablet TAKE 1 TABLET (200 MCG TOTAL) BY MOUTH IN THE MORNING. 90 tablet 0   isosorbide mononitrate (IMDUR) 30 MG 24 hr tablet Take 1 tablet (30 mg total) by mouth daily. 90 tablet 3   No facility-administered medications prior to visit.    Allergies  Allergen Reactions   Prednisone Rash   Sulfa Antibiotics Other (See Comments)    Hyper     ROS     Objective:    Physical Exam Constitutional:      General: He is not in acute distress.    Appearance: He is ill-appearing.  HENT:     Mouth/Throat:     Mouth: Mucous membranes are moist.     Pharynx: Oropharynx is clear.  Eyes:     Extraocular Movements: Extraocular movements intact.     Conjunctiva/sclera: Conjunctivae normal.  Cardiovascular:     Rate and Rhythm: Normal rate.     Pulses: Normal pulses.  Pulmonary:     Effort: Pulmonary effort is normal.     Breath sounds: Normal breath sounds.  Musculoskeletal:     Left forearm: Laceration present.     Comments: Laceration/skin tear to left forearm with bleeding. Pressure applied with nonadherent dressing and ace wrap.  Left forearm, wrist and hand with bruising and swelling  Skin:    General: Skin is dry.     Capillary Refill: Capillary refill takes less than 2 seconds.     Coloration: Skin is pale.  Neurological:     General: No focal deficit present.     Mental Status: He is alert.     BP 128/70 (BP Location: Right Arm, Patient Position: Sitting, Cuff Size: Large)   Pulse 70   Temp 97.6 F (36.4 C) (Temporal)   Ht '5\' 7"'$  (1.702 m)   Wt 152 lb (68.9 kg)   SpO2 98%   BMI 23.81 kg/m  Wt Readings from Last 3 Encounters:  06/11/22 152 lb (68.9 kg)  05/15/22 152 lb 6.4 oz (69.1 kg)  01/13/22 152 lb (68.9 kg)   Repeat BP 108/48.        Assessment & Plan:   Problem List Items Addressed This  Visit       Endocrine   Hypothyroidism   Other Visit Diagnoses     Symptomatic anemia    -  Primary   Relevant Orders   CBC with Differential/Platelet (Completed)   APTT (Completed)   Protime-INR (Completed)   Bleeding       Relevant Orders   CBC with Differential/Platelet (Completed)   APTT (Completed)   Protime-INR (Completed)   Dizziness       Relevant Orders   CBC with Differential/Platelet (Completed)      2 wk hx of bleeding from left forearm. Daughter reports he was seen in [facility] ED and had cauterization of bleeding site done. He has since been bleeding and now symptomatic with dizziness, shortness of breath and generalized weakness.  Hgb 7.9 in office. Normal platelets and Coags unremarkable.  He is taking Plavix. Has internal defibrillator.  EMS called to transport patient to ED for possible admission.   Will attempt to add on TSH to labs from today and refill levothyroxine. He will need a 4 wk f/u with PCP.   I am having Darreon W. Shrestha maintain his Flovent HFA, Polyethyl Glycol-Propyl Glycol (SYSTANE OP), pantoprazole, aspirin EC, acetaminophen, metoprolol, hydrALAZINE, isosorbide mononitrate, furosemide, atorvastatin, famotidine, mirtazapine, montelukast, clopidogrel, and Synthroid.  No orders of the defined  types were placed in this encounter.

## 2022-06-11 NOTE — Telephone Encounter (Signed)
CRITICAL VALUE STICKER  CRITICAL VALUE: hemoglobin 7.9  RECEIVER (on-site recipient of call): shirron  DATE & TIME NOTIFIED: 06/11/22 9:48  MESSENGER (representative from lab):karen  MD NOTIFIED: vickie henson, np-c  TIME OF NOTIFICATION: 9:48 am  RESPONSE:  orthostatic vitals

## 2022-06-11 NOTE — Addendum Note (Signed)
Addended by: Rossie Muskrat on: 06/11/2022 03:19 PM   Modules accepted: Orders

## 2022-06-11 NOTE — ED Provider Notes (Addendum)
[facility] DEPT Provider Note   CSN: [id number] Arrival date & time: 06/11/22  1131     History  Chief Complaint  Patient presents with   Dizziness    [name] is a 89 y.o. male.  Patient has a history of atrial flutter and coronary artery disease.  About 10 days ago he got a cut on his arm and has had some bleeding from from that.  He is on Plavix.  Patient complains of dizziness weakness and some dyspnea on exertion  The history is provided by the patient and medical records.  Dizziness Quality:  Lightheadedness Severity:  Mild Onset quality:  Gradual Timing:  Intermittent Progression:  Waxing and waning Chronicity:  New Context: not when bending over   Relieved by:  Nothing Worsened by:  Nothing Ineffective treatments:  None tried Associated symptoms: no chest pain, no diarrhea and no headaches        Home Medications Prior to Admission medications   Medication Sig Start Date End Date Taking? Authorizing Provider  acetaminophen (TYLENOL) 650 MG CR tablet Take 650 mg by mouth every 8 (eight) hours as needed for pain.    [provider]  aspirin EC 81 MG tablet Take 81 mg by mouth in the morning. Swallow whole.    [provider]  atorvastatin (LIPITOR) 40 MG tablet TAKE 1 TABLET (40 MG TOTAL) BY MOUTH IN THE MORNING. 10/17/21   [name], MD  clopidogrel (PLAVIX) 75 MG tablet TAKE 1 TABLET (75 MG TOTAL) BY MOUTH IN THE MORNING 01/24/22   Janith Lima, MD  famotidine (PEPCID) 40 MG tablet Take 40 mg by mouth 2 (two) times daily.    [provider]  FLOVENT HFA 110 MCG/ACT inhaler Inhale 2 puffs into the lungs 2 (two) times daily. 03/25/15   [provider]  furosemide (LASIX) 20 MG tablet Take 1 tablet (20 mg total) by mouth in the morning. 09/15/21 09/10/22  Horald Pollen, MD  hydrALAZINE (APRESOLINE) 10 MG tablet Take 1 tablet (10 mg total) by mouth in the morning and at bedtime.  08/01/21   [name], MD  isosorbide mononitrate (IMDUR) 30 MG 24 hr tablet Take 1 tablet (30 mg total) by mouth daily. 08/01/21 11/28/21  [name], MD  metoprolol (TOPROL-XL) 200 MG 24 hr tablet Take 0.5 tablets (100 mg total) by mouth in the morning and at bedtime. 01/24/21   [name], NP  mirtazapine (REMERON) 30 MG tablet TAKE 1 TABLET BY MOUTH EVERY DAY 01/12/22   Janith Lima, MD  montelukast (SINGULAIR) 10 MG tablet TAKE 1 TABLET BY MOUTH EVERY DAY 01/16/22   Janith Lima, MD  pantoprazole (PROTONIX) 40 MG tablet Take 40 mg by mouth 2 (two) times daily. Morning & Evening 08/04/18   [provider]  Polyethyl Glycol-Propyl Glycol (SYSTANE OP) Place 1 drop into both eyes in the morning and at bedtime.    [provider]  SYNTHROID 200 MCG tablet TAKE 1 TABLET (200 MCG TOTAL) BY MOUTH IN THE MORNING. 02/15/22   Janith Lima, MD      Allergies    Prednisone and Sulfa antibiotics    Review of Systems   Review of Systems  Constitutional:  Negative for appetite change and fatigue.  HENT:  Negative for congestion, ear discharge and sinus pressure.   Eyes:  Negative for discharge.  Respiratory:  Negative for cough.   Cardiovascular:  Negative for chest pain.  Gastrointestinal:  Negative for abdominal pain and diarrhea.  Genitourinary:  Negative for frequency and hematuria.  Musculoskeletal:  Negative for back pain.  Skin:  Negative for rash.  Neurological:  Positive for dizziness. Negative for seizures and headaches.  Psychiatric/Behavioral:  Negative for hallucinations.     Physical Exam Updated Vital Signs BP (!) 147/60   Pulse (!) 57   Temp 97.9 F (36.6 C) (Oral)   Resp 15   Ht '5\' 7"'$  (1.702 m)   Wt 68.9 kg   SpO2 95%   BMI 23.81 kg/m  Physical Exam Vitals and nursing note reviewed.  Constitutional:      Appearance: He is well-developed.  HENT:     Head: Normocephalic.     Nose: Nose normal.  Eyes:     General: No scleral  icterus.    Conjunctiva/sclera: Conjunctivae normal.  Neck:     Thyroid: No thyromegaly.  Cardiovascular:     Rate and Rhythm: Normal rate and regular rhythm.     Heart sounds: No murmur heard.    No friction rub. No gallop.  Pulmonary:     Breath sounds: No stridor. No wheezing or rales.  Chest:     Chest wall: No tenderness.  Abdominal:     General: There is no distension.     Tenderness: There is no abdominal tenderness. There is no rebound.  Musculoskeletal:        General: Normal range of motion.     Cervical back: Neck supple.  Lymphadenopathy:     Cervical: No cervical adenopathy.  Skin:    Findings: No erythema or rash.  Neurological:     Mental Status: He is alert and oriented to person, place, and time.     Motor: No abnormal muscle tone.     Coordination: Coordination normal.  Psychiatric:        Behavior: Behavior normal.     ED Results / Procedures / Treatments   Labs (all labs ordered are listed, but only abnormal results are displayed) Labs Reviewed  CBC WITH DIFFERENTIAL/PLATELET - Abnormal; Notable for the following components:      Result Value   RBC 2.62 (*)    Hemoglobin 7.4 (*)    HCT 24.8 (*)    MCHC 29.8 (*)    RDW 23.6 (*)    All other components within normal limits  COMPREHENSIVE METABOLIC PANEL - Abnormal; Notable for the following components:   Glucose, Bld 104 (*)    BUN 35 (*)    Creatinine, Ser 2.21 (*)    Calcium 8.6 (*)    GFR, Estimated 28 (*)    All other components within normal limits  TYPE AND SCREEN  PREPARE RBC (CROSSMATCH)    EKG EKG Interpretation  Date/Time:  Thursday June 11 2022 12:08:37 EST Ventricular Rate:  59 PR Interval:  160 QRS Duration: 161 QT Interval:  509 QTC Calculation: 500 R Axis:   -58 Text Interpretation: Sinus rhythm Nonspecific IVCD with LAD LVH with secondary repolarization abnormality Probable inferior infarct, recent Anterior Q waves, possibly due to LVH Lateral leads are also involved  Confirmed by Milton Ferguson [phone]) on 06/11/2022 2:40:49 PM  Radiology No results found.  Procedures Procedures    Medications Ordered in ED Medications  0.9 %  sodium chloride infusion (Manually program via Guardrails IV Fluids) (has no administration in time range)  sodium chloride 0.9 % bolus 500 mL (0 mLs Intravenous Stopped 06/11/22 1300)    ED Course/ Medical Decision Making/ A&P  CRITICAL CARE Performed by: Milton Ferguson Total critical care time: 35 minutes Critical care time was exclusive of separately billable procedures and treating other patients. Critical care was necessary to treat or prevent imminent or life-threatening deterioration. Critical care was time spent personally by me on the following activities: development of treatment plan with patient and/or surrogate as well as nursing, discussions with consultants, evaluation of patient's response to treatment, examination of patient, obtaining history from patient or surrogate, ordering and performing treatments and interventions, ordering and review of laboratory studies, ordering and review of radiographic studies, pulse oximetry and re-evaluation of patient's condition.                          Medical Decision Making Amount and/or Complexity of Data Reviewed Labs: ordered. ECG/medicine tests: ordered.  Risk Prescription drug management. Decision regarding hospitalization.    This patient presents to the ED for concern of anemia and weakness, this involves an extensive number of treatment options, and is a complaint that carries with it a high risk of complications and morbidity.  The differential diagnosis includes anemia and coronary artery disease   Co morbidities that complicate the patient evaluation  CAD   Additional history obtained:  Additional history obtained from patient External records from outside source obtained and reviewed including hospital records   Lab Tests:  I Ordered, and  personally interpreted labs.  The pertinent results include: Hemoglobin, 7.4   Imaging Studies ordered:  No imaging Cardiac Monitoring: / EKG:  The patient was maintained on a cardiac monitor.  I personally viewed and interpreted the cardiac monitored which showed an underlying rhythm of: Normal sinus rhythm   Consultations Obtained:  I requested consultation with the hospitalist,  and discussed lab and imaging findings as well as pertinent plan - they recommend: Admit and transfuse   Problem List / ED Course / Critical interventions / Medication management  Coronary artery disease and anemia I ordered medication including packed red blood cells Reevaluation of the patient after these medicines showed that the patient improved I have reviewed the patients home medicines and have made adjustments as needed   Social Determinants of Health:  None   Test / Admission - Considered:  None   Patient with symptomatic anemia.  He will be admitted to medicine and transfused 1 unit of packed red blood cells        Final Clinical Impression(s) / ED Diagnoses Final diagnoses:  Dizziness    Rx / DC Orders ED Discharge Orders     None         Milton Ferguson, MD 06/13/22 [id number]    Milton Ferguson, MD 06/13/22 [phone]

## 2022-06-11 NOTE — Progress Notes (Signed)
Remote ICD transmission.   

## 2022-06-11 NOTE — ED Triage Notes (Signed)
Patient coming from dr's office with c/o dizziness x2-3 days. Patient reports dizziness with movement.,  denies SOB and chest pain. Negative orthostatic vital signs with EMS. Pt states they have had left forearm bleeding x2 weeks which they got cauterized last week. Patient still reports some bleeding. Pt on Plavix. Hx anemia.

## 2022-06-11 NOTE — H&P (Signed)
History and Physical    Patient: [name] ZLD:[id number] DOB: 11-28-1933 DOA: 06/11/2022 DOS: the patient was seen and examined on 06/11/2022 PCP: Janith Lima, MD  Patient coming from: Home  Chief Complaint:  Chief Complaint  Patient presents with   Dizziness   HPI: [name] is a 89 y.o. male with medical history significant of atrial flutter, CAD, history of MI, history of stent placement x 2, chronic systolic heart failure, mild dementia per patient's stepdaughter, type 2 diabetes, hyperlipidemia, essential hypertension, GERD, gout, hypothyroidism who presented to the emergency department with dizziness after sustaining a wound in his left forearm about 11 days ago that has been oozing since then.  The patient is taking DAPT for CAD.  He stopped aspirin last night, but he has continued taking clopidogrel.  Wound was still oozing in the emergency department and then it was rewrapped. He denied fever, chills, rhinorrhea, sore throat, wheezing or hemoptysis.  He has been dyspneic and lightheaded, particularly with position changes.  No chest pain, palpitations, diaphoresis, PND, orthopnea or pitting edema of the lower extremities.  No abdominal pain, nausea, emesis, diarrhea, melena or hematochezia.  He gets occasionally constipated.  No flank pain, dysuria, frequency or hematuria.  No polyuria, polydipsia, polyphagia or blurred vision.   ED course: Initial vital signs were temperature  97.9 F, pulse 59, respiration 14, BP 136/50 mmHg O2 sat 97% on room air.  The patient received 500 mL normal saline bolus and a PRBC transfusion was ordered.  Lab work: CBC showed white count of 5.9, hemoglobin 7.4 g/dL platelets 300.  CMP with normal electrolytes after calcium correction, glucose 104, BUN 35 and creatinine 2.21 mg/dL.  His previous creatinine level was 1.71 mg/dL.  LFTs were normal.   Review of Systems: As mentioned in the history of present illness. All other systems reviewed and  are negative.  Past Medical History:  Diagnosis Date   Atrial flutter ([place])    (I could not find documentation of this rhythm.)   AUTOMATIC IMPLANTABLE CARDIAC DEFIBRILLATOR SITU    Automatic implantable cardioverter-defibrillator in situ 12/05/2009   Qualifier: Diagnosis of  By: [name], MD, [facility], Binnie Kand    CAD (coronary artery disease) 05/22/2011   Carotid stenosis 05/22/2011   CHF CONGESTIVE HEART FAILURE    45% by echo 2015   COPD 12/01/2007   Qualifier: Diagnosis of  By: [name] NP, [name]     Dementia ([place])    per pt's stepdaughter   DM2 (diabetes mellitus, type 2) ([place])    pt's stepdaughter said he is no longer Diabetic, and does not take medication for it   DYSLIPIDEMIA    DYSPNEA    Edema    Essential hypertension 11/17/2007   Qualifier: Diagnosis of  By: [name]     GERD (gastroesophageal reflux disease)    Gout    HYPERTENSION    HYPOTHYROIDISM    MYOCARDIAL INFARCTION    MI 1999, stent x 2 to RCA, 70% circ, 30 and 40 % LADs   S/P updgrade to CRT-D 11/23/2020   WEIGHT GAIN, ABNORMAL    Past Surgical History:  Procedure Laterality Date   ANGIOPLASTY     stent   APPENDECTOMY  1957   BIV UPGRADE N/A 11/22/2020   Procedure: BIV ICD UPGRADE;  Surgeon: Evans Lance, MD;  Location: [facility];  Service: Cardiovascular;  Laterality: N/A;   CARDIAC DEFIBRILLATOR PLACEMENT     CAROTID STENT  EYE SURGERY  1985   INSERTION OF ILIAC STENT N/A 12/11/2021   Procedure: LOWER EXTREMITY ANGIOGRAM WITH COMMON FEMORAL ARTERY AND PROFUNDA  ARTERY STENTING;  Surgeon: Cherre Robins, MD;  Location: [facility];  Service: Vascular;  Laterality: N/A;   LOWER EXTREMITY ANGIOGRAPHY N/A 05/04/2018   Procedure: LOWER EXTREMITY ANGIOGRAPHY;  Surgeon: [name], MD;  Location: [facility];  Service: Cardiovascular;  Laterality: N/A;   PERIPHERAL VASCULAR INTERVENTION Bilateral 05/04/2018   Procedure: PERIPHERAL VASCULAR INTERVENTION;  Surgeon: [name], MD;  Location: [facility];  Service: Cardiovascular;  Laterality: Bilateral;  [place]   ULTRASOUND GUIDANCE FOR VASCULAR ACCESS Left 12/11/2021   Procedure: ULTRASOUND GUIDANCE FOR VASCULAR ACCESS;  Surgeon: Cherre Robins, MD;  Location: [facility];  Service: Vascular;  Laterality: Left;   Social History:  reports that he quit smoking about 38 years ago. His smoking use included cigarettes. He has never used smokeless tobacco. He reports that he does not drink alcohol and does not use drugs.  Allergies  Allergen Reactions   Prednisone Rash   Sulfa Antibiotics Other (See Comments)    Hyper     Family History  Problem Relation Age of Onset   Other Mother        natural causes   Kidney disease Father    Cancer Brother        throat cancer    Prior to Admission medications   Medication Sig Start Date End Date Taking? Authorizing Provider  acetaminophen (TYLENOL) 650 MG CR tablet Take 650 mg by mouth every 8 (eight) hours as needed for pain.    [provider]  aspirin EC 81 MG tablet Take 81 mg by mouth in the morning. Swallow whole.    [provider]  atorvastatin (LIPITOR) 40 MG tablet TAKE 1 TABLET (40 MG TOTAL) BY MOUTH IN THE MORNING. 10/17/21   [name], MD  clopidogrel (PLAVIX) 75 MG tablet TAKE 1 TABLET (75 MG TOTAL) BY MOUTH IN THE MORNING 01/24/22   Janith Lima, MD  famotidine (PEPCID) 40 MG tablet Take 40 mg by mouth 2 (two) times daily.    [provider]  FLOVENT HFA 110 MCG/ACT inhaler Inhale 2 puffs into the lungs 2 (two) times daily. 03/25/15   [provider]  furosemide (LASIX) 20 MG tablet Take 1 tablet (20 mg total) by mouth in the morning. 09/15/21 09/10/22  Horald Pollen, MD  hydrALAZINE (APRESOLINE) 10 MG tablet Take 1 tablet (10 mg total) by mouth in the morning and at bedtime. 08/01/21   [name], MD  isosorbide mononitrate (IMDUR) 30 MG 24 hr tablet Take 1 tablet (30 mg total) by mouth  daily. 08/01/21 11/28/21  [name], MD  metoprolol (TOPROL-XL) 200 MG 24 hr tablet Take 0.5 tablets (100 mg total) by mouth in the morning and at bedtime. 01/24/21   [name], NP  mirtazapine (REMERON) 30 MG tablet TAKE 1 TABLET BY MOUTH EVERY DAY 01/12/22   Janith Lima, MD  montelukast (SINGULAIR) 10 MG tablet TAKE 1 TABLET BY MOUTH EVERY DAY 01/16/22   Janith Lima, MD  pantoprazole (PROTONIX) 40 MG tablet Take 40 mg by mouth 2 (two) times daily. Morning & Evening 08/04/18   [provider]  Polyethyl Glycol-Propyl Glycol (SYSTANE OP) Place 1 drop into both eyes in the morning and at bedtime.    [provider]  SYNTHROID 200 MCG tablet TAKE 1 TABLET (200  MCG TOTAL) BY MOUTH IN THE MORNING. 02/15/22   Janith Lima, MD    Physical Exam: Vitals:   06/11/22 1140 06/11/22 1144 06/11/22 1300 06/11/22 1400  BP: (!) 136/50  (!) 155/52 (!) 147/60  Pulse: (!) 59  62 (!) 57  Resp: '14  20 15  '$ Temp: 97.9 F (36.6 C)     TempSrc: Oral     SpO2: 97%  95% 95%  Weight:  68.9 kg    Height:  '5\' 7"'$  (1.702 m)     Physical Exam Vitals and nursing note reviewed.  Constitutional:      Appearance: Normal appearance.  HENT:     Head: Normocephalic.     Mouth/Throat:     Mouth: Mucous membranes are moist.  Eyes:     General: No scleral icterus.    Pupils: Pupils are equal, round, and reactive to light.  Cardiovascular:     Rate and Rhythm: Normal rate and regular rhythm.  Pulmonary:     Effort: Pulmonary effort is normal.     Breath sounds: Normal breath sounds.  Abdominal:     General: Bowel sounds are normal. There is no distension.     Palpations: Abdomen is soft.     Tenderness: There is no abdominal tenderness. There is no guarding.  Musculoskeletal:     Cervical back: Neck supple.     Right lower leg: Edema present.     Left lower leg: Edema present.  Skin:    General: Skin is warm and dry.  Neurological:     General: No focal deficit present.      Mental Status: He is alert and oriented to person, place, and time.  Psychiatric:        Mood and Affect: Mood normal.        Behavior: Behavior normal.     Data Reviewed:  There are no new results to review at this time.  03/08/2018 --------  LV EF: 25% -   30%   -------------------------------------------------------------------  Indications:     Decreased Ejection Fraction (R93.1).   -------------------------------------------------------------------  History:  PMH:  Dizziness.  Coronary artery disease.  Risk  factors:  Hypertension. Dyslipidemia.   -------------------------------------------------------------------  Study Conclusions   - Left ventricle: The cavity size was mildly dilated. Systolic    function was severely reduced. The estimated ejection fraction    was in the range of 25% to 30%. Severe diffuse hypokinesis with    distinct regional wall motion abnormalities. There is akinesis of    the basalinferoseptal myocardium. There is akinesis of the    inferior myocardium. There was an increased relative contribution    of atrial contraction to ventricular filling. Doppler parameters    are consistent with abnormal left ventricular relaxation (grade 1    diastolic dysfunction).  - Ventricular septum: Septal motion showed moderate paradox. These    changes are consistent with intraventricular conduction delay.  - Aortic valve: Trileaflet; mildly thickened, moderately calcified    leaflets.  - Mitral valve: There was trivial regurgitation.  - Left atrium: The atrium was mildly dilated.  - Right ventricle: Pacer wire or catheter noted in right ventricle.  - Tricuspid valve: There was trivial regurgitation.   Assessment and Plan: Principal Problem:   Symptomatic anemia Observation/telemetry. Hold aspirin. Hold clopidogrel for now. Transfuse 1 unit of PRBC. Monitor hematocrit and hemoglobin. Transfuse further as needed.  Active Problems:   AKI (acute kidney  injury) ([place])  Received IV fluids in ED.  Will be getting PRBC. Hold diuretic. Avoid hypotension. Avoid nephrotoxins. Monitor intake and output. Monitor renal function electrolytes. Further IV fluids in AM as needed. Check echocardiogram.    Hypothyroidism Continue Synthroid 200 mg p.o. daily.    Chronic combined systolic and diastolic congestive heart failure (HCC)   No signs of decompensation. Continue furosemide 20 mg p.o. daily. Continue metoprolol succinate 100 mg p.o. twice daily. Continue hydralazine 10 mg p.o. daily. Continue Imdur 30 mg p.o. daily. Check echocardiogram.    Other specified chronic obstructive pulmonary disease   Extrinsic asthma Continue Flovent inhaler. Continue Singulair 10 mg p.o. at bedtime. Supplemental oxygen as needed. SABA as needed.    Coronary artery disease Holding DAPT. Continue beta-blocker and statin. Continue slow release nitrates.    Hypertension Continue diuretic, beta-blockers, vasodilators and nitrates. Monitor blood pressure, heart rate, renal function electrolytes.      Advance Care Planning:   Code Status: Full Code   Consults:   Family Communication: His granddaughter was present in his room.  Severity of Illness: The appropriate patient status for this patient is OBSERVATION. Observation status is judged to be reasonable and necessary in order to provide the required intensity of service to ensure the patient's safety. The patient's presenting symptoms, physical exam findings, and initial radiographic and laboratory data in the context of their medical condition is felt to place them at decreased risk for further clinical deterioration. Furthermore, it is anticipated that the patient will be medically stable for discharge from the hospital within 2 midnights of admission.   Author: Reubin Milan, MD 06/11/2022 3:16 PM  For on call review [email or web address].   This document was prepared using Dragon voice  recognition software and may contain some unintended transcription errors.

## 2022-06-12 ENCOUNTER — Observation Stay (HOSPITAL_COMMUNITY): Payer: Medicare Other

## 2022-06-12 DIAGNOSIS — I4892 Unspecified atrial flutter: Secondary | ICD-10-CM | POA: Diagnosis present

## 2022-06-12 DIAGNOSIS — H9190 Unspecified hearing loss, unspecified ear: Secondary | ICD-10-CM | POA: Diagnosis present

## 2022-06-12 DIAGNOSIS — I252 Old myocardial infarction: Secondary | ICD-10-CM | POA: Diagnosis not present

## 2022-06-12 DIAGNOSIS — F03A3 Unspecified dementia, mild, with mood disturbance: Secondary | ICD-10-CM | POA: Diagnosis present

## 2022-06-12 DIAGNOSIS — I110 Hypertensive heart disease with heart failure: Secondary | ICD-10-CM | POA: Diagnosis present

## 2022-06-12 DIAGNOSIS — D500 Iron deficiency anemia secondary to blood loss (chronic): Secondary | ICD-10-CM | POA: Diagnosis present

## 2022-06-12 DIAGNOSIS — K219 Gastro-esophageal reflux disease without esophagitis: Secondary | ICD-10-CM | POA: Diagnosis present

## 2022-06-12 DIAGNOSIS — Z66 Do not resuscitate: Secondary | ICD-10-CM | POA: Diagnosis present

## 2022-06-12 DIAGNOSIS — R001 Bradycardia, unspecified: Secondary | ICD-10-CM | POA: Diagnosis present

## 2022-06-12 DIAGNOSIS — I5042 Chronic combined systolic (congestive) and diastolic (congestive) heart failure: Secondary | ICD-10-CM | POA: Diagnosis present

## 2022-06-12 DIAGNOSIS — D649 Anemia, unspecified: Secondary | ICD-10-CM | POA: Diagnosis present

## 2022-06-12 DIAGNOSIS — E785 Hyperlipidemia, unspecified: Secondary | ICD-10-CM | POA: Diagnosis present

## 2022-06-12 DIAGNOSIS — E119 Type 2 diabetes mellitus without complications: Secondary | ICD-10-CM | POA: Diagnosis present

## 2022-06-12 DIAGNOSIS — Z95820 Peripheral vascular angioplasty status with implants and grafts: Secondary | ICD-10-CM | POA: Diagnosis not present

## 2022-06-12 DIAGNOSIS — Z79899 Other long term (current) drug therapy: Secondary | ICD-10-CM | POA: Diagnosis not present

## 2022-06-12 DIAGNOSIS — I1A0 Resistant hypertension: Secondary | ICD-10-CM | POA: Diagnosis present

## 2022-06-12 DIAGNOSIS — I6529 Occlusion and stenosis of unspecified carotid artery: Secondary | ICD-10-CM | POA: Diagnosis present

## 2022-06-12 DIAGNOSIS — T39015A Adverse effect of aspirin, initial encounter: Secondary | ICD-10-CM | POA: Diagnosis present

## 2022-06-12 DIAGNOSIS — Z95810 Presence of automatic (implantable) cardiac defibrillator: Secondary | ICD-10-CM | POA: Diagnosis not present

## 2022-06-12 DIAGNOSIS — N179 Acute kidney failure, unspecified: Secondary | ICD-10-CM | POA: Diagnosis present

## 2022-06-12 DIAGNOSIS — I4891 Unspecified atrial fibrillation: Secondary | ICD-10-CM | POA: Diagnosis present

## 2022-06-12 DIAGNOSIS — I951 Orthostatic hypotension: Secondary | ICD-10-CM | POA: Diagnosis present

## 2022-06-12 DIAGNOSIS — Z87891 Personal history of nicotine dependence: Secondary | ICD-10-CM | POA: Diagnosis not present

## 2022-06-12 DIAGNOSIS — T45525A Adverse effect of antithrombotic drugs, initial encounter: Secondary | ICD-10-CM | POA: Diagnosis present

## 2022-06-12 DIAGNOSIS — E039 Hypothyroidism, unspecified: Secondary | ICD-10-CM | POA: Diagnosis present

## 2022-06-12 LAB — CBC WITH DIFFERENTIAL/PLATELET
Abs Immature Granulocytes: 0.07 10*3/uL (ref 0.00–0.07)
Basophils Absolute: 0.1 10*3/uL (ref 0.0–0.1)
Basophils Relative: 2 %
Eosinophils Absolute: 0.4 10*3/uL (ref 0.0–0.5)
Eosinophils Relative: 7 %
HCT: 26.5 % — ABNORMAL LOW (ref 39.0–52.0)
Hemoglobin: 8.1 g/dL — ABNORMAL LOW (ref 13.0–17.0)
Immature Granulocytes: 1 %
Lymphocytes Relative: 36 %
Lymphs Abs: 2 10*3/uL (ref 0.7–4.0)
MCH: 28.7 pg (ref 26.0–34.0)
MCHC: 30.6 g/dL (ref 30.0–36.0)
MCV: 94 fL (ref 80.0–100.0)
Monocytes Absolute: 0.4 10*3/uL (ref 0.1–1.0)
Monocytes Relative: 6 %
Neutro Abs: 2.7 10*3/uL (ref 1.7–7.7)
Neutrophils Relative %: 48 %
Platelets: 282 10*3/uL (ref 150–400)
RBC: 2.82 MIL/uL — ABNORMAL LOW (ref 4.22–5.81)
RDW: 21.8 % — ABNORMAL HIGH (ref 11.5–15.5)
WBC: 5.5 10*3/uL (ref 4.0–10.5)
nRBC: 0.4 % — ABNORMAL HIGH (ref 0.0–0.2)

## 2022-06-12 LAB — CBC
HCT: 30 % — ABNORMAL LOW (ref 39.0–52.0)
Hemoglobin: 9.2 g/dL — ABNORMAL LOW (ref 13.0–17.0)
MCH: 28.9 pg (ref 26.0–34.0)
MCHC: 30.7 g/dL (ref 30.0–36.0)
MCV: 94.3 fL (ref 80.0–100.0)
Platelets: 301 10*3/uL (ref 150–400)
RBC: 3.18 MIL/uL — ABNORMAL LOW (ref 4.22–5.81)
RDW: 21.8 % — ABNORMAL HIGH (ref 11.5–15.5)
WBC: 6.6 10*3/uL (ref 4.0–10.5)
nRBC: 0.6 % — ABNORMAL HIGH (ref 0.0–0.2)

## 2022-06-12 LAB — TYPE AND SCREEN
ABO/RH(D): A POS
Antibody Screen: NEGATIVE
Unit division: 0

## 2022-06-12 LAB — BASIC METABOLIC PANEL
Anion gap: 6 (ref 5–15)
BUN: 29 mg/dL — ABNORMAL HIGH (ref 8–23)
CO2: 23 mmol/L (ref 22–32)
Calcium: 8.1 mg/dL — ABNORMAL LOW (ref 8.9–10.3)
Chloride: 110 mmol/L (ref 98–111)
Creatinine, Ser: 1.87 mg/dL — ABNORMAL HIGH (ref 0.61–1.24)
GFR, Estimated: 35 mL/min — ABNORMAL LOW (ref >60)
Glucose, Bld: 92 mg/dL (ref 70–99)
Potassium: 5.1 mmol/L (ref 3.5–5.1)
Sodium: 139 mmol/L (ref 135–145)

## 2022-06-12 LAB — T4, FREE: Free T4: 0.54 ng/dL — ABNORMAL LOW (ref 0.61–1.12)

## 2022-06-12 LAB — BPAM RBC
Blood Product Expiration Date: 202402092359
ISSUE DATE / TIME: 202401111752
Unit Type and Rh: 6200

## 2022-06-12 LAB — IRON AND TIBC
Iron: 71 ug/dL (ref 45–182)
Saturation Ratios: 25 % (ref 17.9–39.5)
TIBC: 286 ug/dL (ref 250–450)
UIBC: 215 ug/dL

## 2022-06-12 LAB — VITAMIN B12: Vitamin B-12: 603 pg/mL (ref 180–914)

## 2022-06-12 LAB — TSH: TSH: 28.746 u[IU]/mL — ABNORMAL HIGH (ref 0.350–4.500)

## 2022-06-12 MED ORDER — MIRTAZAPINE 15 MG PO TABS: 30.0000 mg | ORAL_TABLET | Freq: Every day | ORAL | Status: DC

## 2022-06-12 MED ORDER — ACETAMINOPHEN 650 MG RE SUPP: 650.0000 mg | Freq: Four times a day (QID) | RECTAL | Status: DC | PRN

## 2022-06-12 MED ORDER — DOCUSATE SODIUM 100 MG PO CAPS: 100.0000 mg | ORAL_CAPSULE | Freq: Two times a day (BID) | ORAL | Status: DC | PRN

## 2022-06-12 MED ORDER — FUROSEMIDE 20 MG PO TABS
20.0000 mg | ORAL_TABLET | Freq: Every day | ORAL | Status: DC
  Administered 2022-06-13: 20 mg via ORAL
  Filled 2022-06-12: qty 1

## 2022-06-12 MED ORDER — ONDANSETRON HCL 4 MG/2ML IJ SOLN: 4.0000 mg | Freq: Four times a day (QID) | INTRAMUSCULAR | Status: DC | PRN

## 2022-06-12 MED ORDER — DOCUSATE SODIUM 100 MG PO CAPS: 100.0000 mg | ORAL_CAPSULE | Freq: Two times a day (BID) | ORAL | Status: DC

## 2022-06-12 MED ORDER — ACETAMINOPHEN 325 MG PO TABS
650.0000 mg | ORAL_TABLET | Freq: Four times a day (QID) | ORAL | Status: DC | PRN
  Administered 2022-06-12: 650 mg via ORAL
  Filled 2022-06-12: qty 2

## 2022-06-12 MED ORDER — MOMETASONE FURO-FORMOTEROL FUM 200-5 MCG/ACT IN AERO
2.0000 | INHALATION_SPRAY | Freq: Two times a day (BID) | RESPIRATORY_TRACT | Status: DC
  Administered 2022-06-13: 2 via RESPIRATORY_TRACT
  Filled 2022-06-12: qty 8.8

## 2022-06-12 MED ORDER — ONDANSETRON HCL 4 MG PO TABS: 4.0000 mg | ORAL_TABLET | Freq: Four times a day (QID) | ORAL | Status: DC | PRN

## 2022-06-12 MED ORDER — POLYVINYL ALCOHOL 1.4 % OP SOLN
1.0000 [drp] | Freq: Every day | OPHTHALMIC | Status: DC
  Administered 2022-06-12 – 2022-06-13 (×2): 1 [drp] via OPHTHALMIC
  Filled 2022-06-12: qty 15

## 2022-06-12 MED ORDER — FOLIC ACID 1 MG PO TABS
1.0000 mg | ORAL_TABLET | Freq: Every day | ORAL | Status: DC
  Administered 2022-06-12 – 2022-06-13 (×2): 1 mg via ORAL
  Filled 2022-06-12 (×2): qty 1

## 2022-06-12 MED ORDER — METOPROLOL SUCCINATE ER 50 MG PO TB24
50.0000 mg | ORAL_TABLET | Freq: Two times a day (BID) | ORAL | Status: DC
  Administered 2022-06-12: 50 mg via ORAL
  Filled 2022-06-12: qty 1

## 2022-06-12 NOTE — Hospital Course (Signed)
PMH of A-fib/a flutter, CAD, PVD, chronic HFrEF SP AICD implant, HTN, GERD, type II DM, HLD present to the hospital with complaints of dizziness.  Patient sustained injury to his elbow 2 weeks ago with significant bleeding.  Reportedly ED there in [place], cauterized a vein.  Started to have worsening fatigue and shortness of breath.  Found to have symptomatic anemia.  Also AKI.

## 2022-06-12 NOTE — Progress Notes (Signed)
[facility] Progress Note Patient: [name] ZOX:[id number] DOB: 09-11-1933 DOA: 06/11/2022  DOS: the patient was seen and examined on 06/12/2022  Brief hospital course: PMH of A-fib/a flutter, CAD, PVD, chronic HFrEF SP AICD implant, HTN, GERD, type II DM, HLD present to the hospital with complaints of dizziness.  Patient sustained injury to his elbow 2 weeks ago with significant bleeding.  Reportedly ED there in [place], cauterized a vein.  Started to have worsening fatigue and shortness of breath.  Found to have symptomatic anemia.  Also AKI. Assessment and Plan: Symptomatic anemia. From ongoing use of aspirin and Plavix in the setting of recent injury. Last use of aspirin was a few days ago. Last use of Plavix was Wednesday. Currently holding both of them. SP 1 PRBC transfusion with adequate hemoglobin response. For now we will monitor. Iron level normal.  B12 level normal. Wound does not appear to be bleeding anymore.  Acute kidney injury. Baseline serum creatinine around 1.4. Last serum creatinine in June was 1.7. On admission serum creatinine 2.2.  Now improving to 1.87.  Monitor.  Orthostatic hypotension. Sinus bradycardia. Patient has been having frequent episodes of resistant hypertension. Reassuringly his blood pressure is soft here but not orthostatic. Currently holding his hydralazine. Also reducing the dose of the Toprol-XL from 100 twice daily to 50 twice daily per recent plan from cardiology with the evidence of bradycardia with heart rate in 40s and 50s in the hospital.  Atrial flutter. Not on any long-term anticoagulation. On metoprolol 100 mg twice daily.  Currently reducing to 50 twice daily.  Hypothyroidism. On Synthroid 200 mcg. TSH still significantly elevated. Will monitor free T4 results.  CAD. PAD. Complex case. Follows up with vascular surgery. Patient is on dual antiplatelet therapy. Currently holding both of them in the setting of  active bleeding concerns. Likely will resume aspirin on discharge.  HLD. Continue statin.  Mood disorder. Patient is on Remeron 30 mg daily takes during the daytime. Recommend to switch it to nighttime.  Dementia. Appears to be progressively worsening. Will monitor. At risk for sundowning in the hospital.  GERD. Continuing PPI.  Bilateral leg swelling. Improving per family. Will try Unna boots.  Goals of care conversation. Patient is developing dementia and also hard of hearing. Wife also has Alzheimer's dementia per daughter. Daughter is going to be HCPOA and next of kin as well as primary caretaker. Per daughter patient would not like to be resuscitated and kept alive on machines. Based on this conversation will order DNR/DNI. Patient has an ICD which should be kept functioning.   Subjective: No nausea no vomiting no fever no chills.  Significant pain and swelling of his legs.  Physical Exam: General: in Mild distress, No Rash Cardiovascular: S1 and S2 Present, No Murmur Respiratory: Good respiratory effort, Bilateral Air entry present. No Crackles, No wheezes Abdomen: Bowel Sound present, No tenderness Extremities: Chronic bilateral edema Neuro: Alert and oriented x3, no new focal deficit   Data Reviewed: I have Reviewed nursing notes, Vitals, and Lab results. Since last encounter, pertinent lab results CBC and BMP   . I have ordered test including CBC and BMP  .   Disposition: Status is: Inpatient Remains inpatient appropriate because: Need close observation.  Need further titration of his cardiac medication  SCDs Start: 06/11/22 1516   Family Communication: Daughter at bedside Level of care: Telemetry continue telemetry due to bradycardia. Vitals:   06/12/22 0136 06/12/22 0355 06/12/22 0459 06/12/22 1358  BP: (!) 120/52 Marland Kitchen)  113/53  (!) 118/48  Pulse: (!) 54 (!) 47 (!) 56 (!) 51  Resp: '18 18  16  '$ Temp: (!) 97.4 F (36.3 C) 98.8 F (37.1 C)  97.6 F  (36.4 C)  TempSrc: Oral Oral  Oral  SpO2: 96% 96%  97%  Weight:      Height:         Author: Berle Mull, MD 06/12/2022 6:37 PM  Please look on [email or web address] to find out who is on call.

## 2022-06-12 NOTE — Progress Notes (Signed)
Orthopedic Tech Progress Note Patient Details:  [name] [birth year] [id number]  Ortho Devices Type of Ortho Device: Haematologist Ortho Device/Splint Location: Bi LE Ortho Device/Splint Interventions: Application   Post Interventions Patient Tolerated: Well  [name] 06/12/2022, 4:10 PM

## 2022-06-13 DIAGNOSIS — D649 Anemia, unspecified: Secondary | ICD-10-CM | POA: Diagnosis not present

## 2022-06-13 LAB — BASIC METABOLIC PANEL
Anion gap: 7 (ref 5–15)
BUN: 24 mg/dL — ABNORMAL HIGH (ref 8–23)
CO2: 23 mmol/L (ref 22–32)
Calcium: 8.1 mg/dL — ABNORMAL LOW (ref 8.9–10.3)
Chloride: 105 mmol/L (ref 98–111)
Creatinine, Ser: 1.63 mg/dL — ABNORMAL HIGH (ref 0.61–1.24)
GFR, Estimated: 41 mL/min — ABNORMAL LOW (ref >60)
Glucose, Bld: 122 mg/dL — ABNORMAL HIGH (ref 70–99)
Potassium: 4 mmol/L (ref 3.5–5.1)
Sodium: 135 mmol/L (ref 135–145)

## 2022-06-13 LAB — CBC
HCT: 28.2 % — ABNORMAL LOW (ref 39.0–52.0)
Hemoglobin: 8.6 g/dL — ABNORMAL LOW (ref 13.0–17.0)
MCH: 28.8 pg (ref 26.0–34.0)
MCHC: 30.5 g/dL (ref 30.0–36.0)
MCV: 94.3 fL (ref 80.0–100.0)
Platelets: 254 10*3/uL (ref 150–400)
RBC: 2.99 MIL/uL — ABNORMAL LOW (ref 4.22–5.81)
RDW: 21.2 % — ABNORMAL HIGH (ref 11.5–15.5)
WBC: 4.8 10*3/uL (ref 4.0–10.5)
nRBC: 0 % (ref 0.0–0.2)

## 2022-06-13 LAB — MAGNESIUM: Magnesium: 1.7 mg/dL (ref 1.7–2.4)

## 2022-06-13 MED ORDER — FOLIC ACID 1 MG PO TABS: 1.0000 mg | ORAL_TABLET | Freq: Every day | ORAL | 0 refills | Status: DC

## 2022-06-13 MED ORDER — LEVOTHYROXINE SODIUM 75 MCG PO TABS: 225.0000 ug | ORAL_TABLET | Freq: Every morning | ORAL | 0 refills | Status: DC

## 2022-06-13 MED ORDER — ADULT MULTIVITAMIN W/MINERALS CH
1.0000 | ORAL_TABLET | Freq: Every day | ORAL | Status: DC
  Administered 2022-06-13: 1 via ORAL
  Filled 2022-06-13: qty 1

## 2022-06-13 MED ORDER — ADULT MULTIVITAMIN W/MINERALS CH: 1.0000 | ORAL_TABLET | Freq: Every day | ORAL | 0 refills | Status: DC

## 2022-06-13 MED ORDER — DOCUSATE SODIUM 100 MG PO CAPS: 100.0000 mg | ORAL_CAPSULE | Freq: Two times a day (BID) | ORAL | 0 refills | Status: DC | PRN

## 2022-06-13 MED ORDER — MIRTAZAPINE 30 MG PO TABS: 30.0000 mg | ORAL_TABLET | Freq: Every day | ORAL | 1 refills | Status: DC

## 2022-06-13 MED ORDER — METOPROLOL SUCCINATE ER 50 MG PO TB24: 50.0000 mg | ORAL_TABLET | Freq: Two times a day (BID) | ORAL | 0 refills | Status: DC

## 2022-06-13 MED ORDER — CLOPIDOGREL BISULFATE 75 MG PO TABS: 75.0000 mg | ORAL_TABLET | Freq: Every morning | ORAL | 1 refills | Status: DC

## 2022-06-13 MED ORDER — LEVOTHYROXINE SODIUM 75 MCG PO TABS
225.0000 ug | ORAL_TABLET | Freq: Every morning | ORAL | Status: DC
  Administered 2022-06-13: 225 ug via ORAL
  Filled 2022-06-13: qty 1

## 2022-06-13 MED ORDER — VASELINE PETROLATUM GAUZE EX PADS: MEDICATED_PAD | Freq: Every day | CUTANEOUS | 0 refills | Status: DC

## 2022-06-13 MED ORDER — ASPIRIN EC 81 MG PO TBEC: 81.0000 mg | DELAYED_RELEASE_TABLET | Freq: Every morning | ORAL | 11 refills | Status: DC

## 2022-06-13 NOTE — Discharge Instructions (Signed)
[place]  [facility]  [phone]

## 2022-06-13 NOTE — Progress Notes (Signed)
CSWhad been informed that the [facility] can not take the patient due to address. Patients daughter will be giving the number to [place] [facility] home care to see about private pay for a home health aid.

## 2022-06-13 NOTE — Evaluation (Signed)
Physical Therapy Evaluation Patient Details Name: [name] MRN: [id number] DOB: 1934-01-26 Today's Date: 06/13/2022  History of Present Illness  89 yo male H/O  of A-fib/a flutter, CAD, PVD, chronic HFrEF SP AICD implant, HTN, GERD, type II DM, HLD present to the hospital1/11/24  with complaints of dizziness.  Patient sustained injury to his elbow 2 weeks ago with significant bleeding.  Reportedly ED there in [place], cauterized a vein.  Started to have worsening fatigue and shortness of breath.  Found to have symptomatic anemia.  Also AKI.  Clinical Impression  Pt admitted with above diagnosis.  Pt currently with functional limitations due to the deficits listed below (see PT Problem List). Pt will benefit from skilled PT to increase their independence and safety with mobility to allow discharge to the venue listed below.     Patient is eager to return home. Patient ambulated x 190' with Rw and min guard/supervision. Did  demonstrate taking steps with no Rw and  was steady.  BP sitting 100/51, after ambulation-114/45. Patient reported vision was "Fuzzy" initially, Not dizziness.  Daughter present, agreeable for patient to return home as patient's wife home recovering from a fractured humerus and requires assistance.   Recommendations for follow up therapy are one component of a multi-disciplinary discharge planning process, led by the attending physician.  Recommendations may be updated based on patient status, additional functional criteria and insurance authorization.  Follow Up Recommendations Home health PT      Assistance Recommended at Discharge Set up Supervision/Assistance  Patient can return home with the following  A lot of help with bathing/dressing/bathroom;Assistance with cooking/housework;Assist for transportation;Help with stairs or ramp for entrance    Equipment Recommendations None recommended by PT  Recommendations for Other Services       Functional Status  Assessment Patient has had a recent decline in their functional status and demonstrates the ability to make significant improvements in function in a reasonable and predictable amount of time.     Precautions / Restrictions Precautions Precautions: Fall Precaution Comments: chronic leg wounds, LUE forearm wound      Mobility  Bed Mobility               General bed mobility comments: in recliner    Transfers Overall transfer level: Needs assistance Equipment used: Rolling walker (2 wheels) Transfers: Sit to/from Stand Sit to Stand: Min assist           General transfer comment: asked to pull up  on therapist's hand    Ambulation/Gait Ambulation/Gait assistance: Min guard Gait Distance (Feet): 190 Feet Assistive device: Rolling walker (2 wheels) Gait Pattern/deviations: Step-through pattern Gait velocity: decr Gait velocity interpretation: <1.31 ft/sec, indicative of household ambulator   General Gait Details: gait slow, steady, cues for safety around objects  Stairs            Wheelchair Mobility    Modified Rankin (Stroke Patients Only)       Balance Overall balance assessment: Mild deficits observed, not formally tested                                           Pertinent Vitals/Pain Pain Assessment Pain Assessment: No/denies pain    Home Living Family/patient expects to be discharged to:: Private residence Living Arrangements: Spouse/significant other;Children;Other relatives Available Help at Discharge: Available 24 hours/day;Family Type of Home: House Home Access: Stairs to  enter Entrance Stairs-Rails: Right;Left Entrance Stairs-Number of Steps: 3   Home Layout: One level Home Equipment: Conservation officer, nature (2 wheels);Rollator (4 wheels);Cane - single point      Prior Function Prior Level of Function : Independent/Modified Independent             Mobility Comments: in home did not use AD, recently since fall, using  rollator ADLs Comments: supervision/set up     Hand Dominance   Dominant Hand: Right    Extremity/Trunk Assessment   Upper Extremity Assessment Upper Extremity Assessment: Overall WFL for tasks assessed    Lower Extremity Assessment Lower Extremity Assessment: Generalized weakness    Cervical / Trunk Assessment Cervical / Trunk Assessment: Kyphotic  Communication   Communication: No difficulties;HOH  Cognition Arousal/Alertness: Awake/alert Behavior During Therapy: WFL for tasks assessed/performed Overall Cognitive Status: Within Functional Limits for tasks assessed                                          General Comments      Exercises     Assessment/Plan    PT Assessment Patient needs continued PT services  PT Problem List Decreased strength;Decreased mobility;Decreased safety awareness;Decreased activity tolerance;Decreased knowledge of use of DME       PT Treatment Interventions DME instruction;Therapeutic activities;Gait training;Therapeutic exercise;Patient/family education;Functional mobility training;Balance training    PT Goals (Current goals can be found in the Care Plan section)  Acute Rehab PT Goals Patient Stated Goal: go home PT Goal Formulation: With patient/family Time For Goal Achievement: 06/27/22 Potential to Achieve Goals: Good    Frequency Min 3X/week     Co-evaluation               AM-PAC PT "6 Clicks" Mobility  Outcome Measure Help needed turning from your back to your side while in a flat bed without using bedrails?: A Little Help needed moving from lying on your back to sitting on the side of a flat bed without using bedrails?: A Little Help needed moving to and from a bed to a chair (including a wheelchair)?: A Little Help needed standing up from a chair using your arms (e.g., wheelchair or bedside chair)?: A Little Help needed to walk in hospital room?: A Little Help needed climbing 3-5 steps with a  railing? : A Little 6 Click Score: 18    End of Session Equipment Utilized During Treatment: Gait belt Activity Tolerance: Patient tolerated treatment well Patient left: in chair;with call bell/phone within reach;with chair alarm set;with family/visitor present Nurse Communication: Mobility status PT Visit Diagnosis: Unsteadiness on feet (R26.81);Difficulty in walking, not elsewhere classified (R26.2)    Time: 5284-1324 PT Time Calculation (min) (ACUTE ONLY): 24 min   Charges:   PT Evaluation $PT Eval Low Complexity: 1 Low PT Treatments $Gait Training: 8-22 mins        [place] [facility] [phone] Weekend [phone]   Claretha Cooper 06/13/2022, 11:51 AM

## 2022-06-13 NOTE — TOC Progression Note (Signed)
Transition of Care [facility]) - Progression Note    Patient Details  Name: [name] MRN: [id number] Date of Birth: 1933/11/08  [facility] CM/SW Contact  Rodney Booze, [place] Phone Number: 06/13/2022, 12:00 PM  Clinical Narrative:     CSW spoke with the patient's daughter about HHPT, as this CSW was able to set up [facility] with [facility]. The daughter also stated that she needed more of a home health aid. This CSW advised the daughter that when the [facility] agency came out to ask let about the other needs that she has concerns about to see if they can help with those resources in the community. At this time there are no other TOC needs, however TOC will continue to follow for safe organized DC.        Expected Discharge Plan and Services         Expected Discharge Date: 06/13/22                                     Social Determinants of Health (SDOH) Interventions SDOH Screenings   Food Insecurity: No Food Insecurity (06/11/2022)  Housing: Low Risk  (06/11/2022)  Transportation Needs: No Transportation Needs (06/11/2022)  Utilities: Not At Risk (06/11/2022)  Depression (PHQ2-9): Medium Risk (06/11/2022)  Tobacco Use: Medium Risk (06/11/2022)    Readmission Risk Interventions    06/12/2022    3:15 PM  Readmission Risk Prevention Plan  Transportation Screening Complete  PCP or Specialist Appt within 5-7 Days Complete  Home Care Screening Complete  Medication Review (RN CM) Complete

## 2022-06-15 ENCOUNTER — Telehealth: Payer: Self-pay | Admitting: *Deleted

## 2022-06-15 ENCOUNTER — Encounter: Payer: Self-pay | Admitting: *Deleted

## 2022-06-15 NOTE — Discharge Summary (Signed)
Physician Discharge Summary   Patient: [name] MRN: [id number] DOB: [birth year]  Admit date:     06/11/2022  Discharge date: 06/13/2022  Discharge Physician: Berle Mull  PCP: Janith Lima, MD  Recommendations at discharge:  Follow up with PCP in 1 week   Follow-up Information     Janith Lima, MD. Schedule an appointment as soon as possible for a visit in 1 week(s).   Specialty: Internal Medicine Contact information: [place] [address] [phone]                Discharge Diagnoses: Principal Problem:   Symptomatic anemia Active Problems:   Hypothyroidism   Chronic systolic congestive heart failure (HCC)   Other specified chronic obstructive pulmonary disease   Coronary artery disease   Hypertension   Extrinsic asthma   AKI (acute kidney injury) [facility])  Hospital Course: PMH of A-fib/a flutter, CAD, PVD, chronic HFrEF SP AICD implant, HTN, GERD, type II DM, HLD present to the hospital with complaints of dizziness.  Patient sustained injury to his elbow 2 weeks ago with significant bleeding.  Reportedly ED there in [place], cauterized a vein.  Started to have worsening fatigue and shortness of breath.  Found to have symptomatic anemia.  Also AKI.  Assessment and Plan  Symptomatic anemia. From ongoing use of aspirin and Plavix in the setting of recent injury. Last use of aspirin was a few days ago. Last use of Plavix was Wednesday. We held both of them with plans to resume both of them gradually laer SP 1 PRBC transfusion with adequate hemoglobin response. Iron level normal.  B12 level normal. Wound does not appear to be bleeding anymore.   Acute kidney injury. Baseline serum creatinine around 1.4. Last serum creatinine in June was 1.7. On admission serum creatinine 2.2.  Now improving   Orthostatic hypotension. Sinus bradycardia. Patient has been having frequent episodes of orthostatic hypotension. Reassuringly his blood  pressure is soft here but not orthostatic. Reducing the dose of the Toprol-XL from 100 twice daily to 50 twice daily per recent plan from cardiology with the evidence of bradycardia with heart rate in 40s and 50s in the hospital.   Atrial flutter. Not on any long-term anticoagulation. On metoprolol 100 mg twice daily.  Currently reducing to 50 twice daily.   Hypothyroidism. On Synthroid 200 mcg. TSH still significantly elevated. free T4 low.  Increase the dose to 225 mcg daily.    CAD. PAD. Complex case. Follows up with vascular surgery. Patient is on dual antiplatelet therapy.   HLD. Continue statin.   Mood disorder. Patient is on Remeron 30 mg daily takes during the daytime. Recommend to switch it to nighttime.   Dementia. Appears to be progressively worsening.   GERD. Continuing PPI.   Bilateral leg swelling. Improving per family. Will try Unna boots.   Goals of care conversation. Patient is developing dementia and also hard of hearing. Wife also has Alzheimer's dementia per daughter. Daughter is going to be HCPOA and next of kin as well as primary caretaker. Per daughter patient would not like to be resuscitated and kept alive on machines. Based on this conversation will order DNR/DNI. Patient has an ICD which should be kept functioning.   Consultants:  none  Procedures performed:  non  DISCHARGE MEDICATION: Allergies as of 06/13/2022       Reactions   Tape Other (See Comments)   SKIN IS VERY DELICATE AND WILL TEAR EASILY!!!!   Prednisone  Rash   Sulfa Antibiotics Other (See Comments)   "Hyper"        Medication List     TAKE these medications    acetaminophen 650 MG CR tablet Commonly known as: TYLENOL Take 650 mg by mouth every 8 (eight) hours as needed for pain.   aspirin EC 81 MG tablet Take 1 tablet (81 mg total) by mouth in the morning. Swallow whole.   atorvastatin 40 MG tablet Commonly known as: LIPITOR TAKE 1 TABLET (40 MG TOTAL)  BY MOUTH IN THE MORNING.   clopidogrel 75 MG tablet Commonly known as: PLAVIX Take 1 tablet (75 mg total) by mouth in the morning. Start taking on: June 22, 2022 What changed: These instructions start on June 22, 2022. If you are unsure what to do until then, ask your doctor or other care provider.   docusate sodium 100 MG capsule Commonly known as: COLACE Take 1 capsule (100 mg total) by mouth 2 (two) times daily as needed for mild constipation.   famotidine 40 MG tablet Commonly known as: PEPCID Take 40 mg by mouth 2 (two) times daily.   folic acid 1 MG tablet Commonly known as: FOLVITE Take 1 tablet (1 mg total) by mouth daily.   furosemide 20 MG tablet Commonly known as: LASIX Take 1 tablet (20 mg total) by mouth in the morning.   hydrALAZINE 10 MG tablet Commonly known as: APRESOLINE Take 1 tablet (10 mg total) by mouth in the morning and at bedtime.   isosorbide mononitrate 30 MG 24 hr tablet Commonly known as: IMDUR Take 1 tablet (30 mg total) by mouth daily.   levothyroxine 75 MCG tablet Commonly known as: Synthroid Take 3 tablets (225 mcg total) by mouth in the morning. What changed:  medication strength how much to take   metoprolol succinate 50 MG 24 hr tablet Commonly known as: TOPROL-XL Take 1 tablet (50 mg total) by mouth 2 (two) times daily. Take with or immediately following a meal. What changed:  medication strength how much to take when to take this additional instructions   mirtazapine 30 MG tablet Commonly known as: REMERON Take 1 tablet (30 mg total) by mouth at bedtime. What changed: when to take this   montelukast 10 MG tablet Commonly known as: SINGULAIR TAKE 1 TABLET BY MOUTH EVERY DAY   multivitamin with minerals Tabs tablet Take 1 tablet by mouth daily.   pantoprazole 40 MG tablet Commonly known as: PROTONIX Take 40 mg by mouth in the morning and at bedtime.   Refresh Optive PF 0.5-0.9 % Soln Generic drug:  Carboxymethylcell-Glycerin PF Place 1 drop into both eyes daily.   Tums E-X 750 750 MG chewable tablet Generic drug: calcium carbonate Chew 1-4 tablets by mouth daily.   Vaseline Petrolatum Gauze Apply topically daily.   Wixela Inhub 250-50 MCG/ACT Aepb Generic drug: fluticasone-salmeterol Inhale 1 puff into the lungs in the morning and at bedtime.               Discharge Care Instructions  (From admission, onward)           Start     Ordered   06/13/22 0000  Discharge wound care:       Comments: Cleanse wound bed with normal saline.  Apply petroleum gauze to wound.  Apply dry gauze on top of petroleum gauze and wrap with kerlex and then ace wrap daily   06/13/22 1147           Disposition:  Home Diet recommendation: Cardiac diet  Discharge Exam: Vitals:   06/12/22 1926 06/12/22 2010 06/13/22 0544 06/13/22 1332  BP: (!) 148/61  (!) 112/49 (!) 128/53  Pulse: (!) 57  (!) 47 (!) 52  Resp: '18 16 17 18  '$ Temp: (!) 97.5 F (36.4 C)  98.6 F (37 C) 98 F (36.7 C)  TempSrc: Oral  Oral Oral  SpO2: 98%  92% 96%  Weight:      Height:       General: Appear in no distress; no visible Abnormal Neck Mass Or lumps, Conjunctiva normal Cardiovascular: S1 and S2 Present, no Murmur, Respiratory: good respiratory effort, Bilateral Air entry present and CTA, no Crackles, no wheezes Abdomen: Bowel Sound present, Non tender  Extremities: no Pedal edema Neurology: alert and oriented to place and person  Filed Weights   06/11/22 1144  Weight: 68.9 kg   Condition at discharge: stable  The results of significant diagnostics from this hospitalization (including imaging, microbiology, ancillary and laboratory) are listed below for reference.   Imaging Studies: CUP PACEART REMOTE DEVICE CHECK  Result Date: 05/22/2022 Scheduled remote reviewed. Normal device function.  Next remote 91 days. [place]   Microbiology: Results for orders placed or performed during the hospital  encounter of 11/28/21  Surgical pcr screen     Status: None   Collection Time: 11/28/21 11:59 AM   Specimen: Nasal Mucosa; Nasal Swab  Result Value Ref Range Status   MRSA, PCR NEGATIVE NEGATIVE Final   Staphylococcus aureus NEGATIVE NEGATIVE Final    Comment: (NOTE) The Xpert SA Assay (FDA approved for NASAL specimens in patients 56 years of age and older), is one component of a comprehensive surveillance program. It is not intended to diagnose infection nor to guide or monitor treatment. Performed at [facility], [place] [address], [place], [place] [address]    Labs: CBC: Recent Labs  Lab 06/11/22 0901 06/11/22 1159 06/12/22 0809 06/12/22 1626 06/13/22 0908  WBC 5.7 5.9 5.5 6.6 4.8  NEUTROABS 3.0 3.4 2.7  --   --   HGB 7.9 Repeated and verified X2.* 7.4* 8.1* 9.2* 8.6*  HCT 24.3* 24.8* 26.5* 30.0* 28.2*  MCV 89.9 94.7 94.0 94.3 94.3  PLT 322.0 300 282 301 993   Basic Metabolic Panel: Recent Labs  Lab 06/11/22 1159 06/12/22 0809 06/13/22 0908  NA 136 139 135  K 4.2 5.1 4.0  CL 103 110 105  CO2 '24 23 23  '$ GLUCOSE 104* 92 122*  BUN 35* 29* 24*  CREATININE 2.21* 1.87* 1.63*  CALCIUM 8.6* 8.1* 8.1*  MG  --   --  1.7   Liver Function Tests: Recent Labs  Lab 06/11/22 1159  AST 19  ALT 8  ALKPHOS 44  BILITOT 0.9  PROT 7.9  ALBUMIN 3.6   CBG: No results for input(s): "GLUCAP" in the last 168 hours.  Discharge time spent: greater than 30 minutes.  Signed: Berle Mull, MD [facility] 06/13/2022

## 2022-06-15 NOTE — Patient Outreach (Addendum)
Care Coordination [facility] Note Transition Care Management Follow-up Telephone Call Date of discharge and from where: Saturday 06/13/22, [name]; dizziness/ symptomatic anemia; AKI How have you been since you were released from the hospital? Per daughter Mardene Celeste, on [place]: "He is doing well; better than I though he would... overall, I am helping him with everything he needs help with, like I always do-- I take care of both of my parents, and am currently at the doctors office now with my mother.  I have someone staying at the house with Dad right now.  I'd like to get him in with Dr. Ronnald Ramp quicker than I have an appointment-- I'd like to get him in this week, as they recommended when they released him from the hospital-- yes, please have someone call me to help me get that scheduled, I have some questions about resuming some of his medications" Any questions or concerns? Yes- has questions about medications post-hospital discharge-- declines medication review, as she is out of her home currently with her mother at doctor appointment- she wishes to schedule PCP hospital follow up visit for this week as advised by hospital discharge doctor-- sent scheduling care guide team request to contact daughter to facilitate  Items Reviewed: Did the pt receive and understand the discharge instructions provided? Yes - thoroughly reviewed with caregiver/ daughter Medications obtained and verified? No - daughter unable-- she is not near medications-- she is at doctor appointment with her mother (patient's spouse); daughter reports she manages all aspects of medication administration for patient; reports she has non-urgent questions about current medications and would like sooner hospital follow up office visit scheduled Other? No  Any new allergies since your discharge? No  Dietary orders reviewed? Yes Do you have support at home? Yes  daughter/ caregiver provides assistance with all care needs as indicated/  needed; caregiver resides with patient permanently  Home Care and Equipment/Supplies: Were home health services ordered? yes If so, what is the name of the agency? [facility]  Has the agency set up a time to come to the patient's home? No-- [facility] ended up not accepting home health referral due to location of patient's home Were any new equipment or medical supplies ordered?  No What is the name of the medical supply agency? N/A Were you able to get the supplies/equipment? not applicable Do you have any questions related to the use of the equipment or supplies? No N/A  Functional Questionnaire: (I = Independent and D = Dependent) ADLs: I  caregiver provides assistance with all care needs as indicated  Bathing/Dressing- I  caregiver provides assistance with all care needs as indicated  Meal Prep- D daughter prepares all meals  Eating- I  Maintaining continence- I  caregiver provides assistance with all care needs as indicated  Transferring/Ambulation- I  caregiver provides assistance with all care needs as indicated  Managing Meds- D daughter/ caregiver manages all aspects of medication administration  Follow up appointments reviewed:  PCP Hospital f/u appt confirmed? Yes  Scheduled to see Dr. Ronnald Ramp PCP on Tuesday, 06/23/22 @ 10:40 am-- as above, daughter wishes to have sooner appointment-- request for facilitation of scheduling sooner appointment sent to scheduling care guide team [facility] f/u appt confirmed? No  Scheduled to see - on - @ - Are transportation arrangements needed? No  If their condition worsens, is the pt aware to call PCP or go to the Emergency Dept.? Yes Was the patient provided with contact information for the PCP's office or  ED? No daughter reports already has contact information for all care providers Was to pt encouraged to call back with questions or concerns? Yes  SDOH assessments and interventions completed:   Yes SDOH Interventions Today     Flowsheet Row Most Recent Value  SDOH Interventions   Food Insecurity Interventions Intervention Not Indicated  Transportation Interventions Intervention Not Indicated  [daughter provides transportation]      Care Coordination Interventions:  PCP follow up appointment requested Provided general information/ guidance/ advice around non-urgent medication questions; care coordination outreach sent to scheduling care guide to offer follow up telephone outreach from [place] Coordinator, given that caregiver is not able to talk for long today during TOC call     Encounter Outcome:  Pt. Visit Completed    Oneta Rack, RN, BSN, CCRN Alumnus RN CM Care Coordination/ Transition of [place] Management [phone]: direct office

## 2022-06-18 ENCOUNTER — Encounter: Payer: Self-pay | Admitting: Family Medicine

## 2022-06-18 ENCOUNTER — Ambulatory Visit (INDEPENDENT_AMBULATORY_CARE_PROVIDER_SITE_OTHER): Payer: Medicare Other | Admitting: Family Medicine

## 2022-06-18 ENCOUNTER — Telehealth: Payer: Self-pay

## 2022-06-18 VITALS — BP 142/66 | HR 66 | Temp 97.8°F | Ht 67.0 in | Wt 157.0 lb

## 2022-06-18 DIAGNOSIS — I10 Essential (primary) hypertension: Secondary | ICD-10-CM | POA: Diagnosis not present

## 2022-06-18 DIAGNOSIS — N189 Chronic kidney disease, unspecified: Secondary | ICD-10-CM | POA: Diagnosis not present

## 2022-06-18 DIAGNOSIS — Z09 Encounter for follow-up examination after completed treatment for conditions other than malignant neoplasm: Secondary | ICD-10-CM | POA: Insufficient documentation

## 2022-06-18 DIAGNOSIS — D649 Anemia, unspecified: Secondary | ICD-10-CM | POA: Diagnosis not present

## 2022-06-18 DIAGNOSIS — R42 Dizziness and giddiness: Secondary | ICD-10-CM | POA: Diagnosis not present

## 2022-06-18 DIAGNOSIS — Z9289 Personal history of other medical treatment: Secondary | ICD-10-CM

## 2022-06-18 DIAGNOSIS — E039 Hypothyroidism, unspecified: Secondary | ICD-10-CM | POA: Diagnosis not present

## 2022-06-18 LAB — BASIC METABOLIC PANEL
BUN: 25 mg/dL — ABNORMAL HIGH (ref 6–23)
CO2: 27 mEq/L (ref 19–32)
Calcium: 9.9 mg/dL (ref 8.4–10.5)
Chloride: 101 mEq/L (ref 96–112)
Creatinine, Ser: 1.57 mg/dL — ABNORMAL HIGH (ref 0.40–1.50)
GFR: 39.66 mL/min — ABNORMAL LOW (ref >60.00)
Glucose, Bld: 90 mg/dL (ref 70–99)
Potassium: 5.1 mEq/L (ref 3.5–5.1)
Sodium: 135 mEq/L (ref 135–145)

## 2022-06-18 LAB — CBC WITH DIFFERENTIAL/PLATELET
Basophils Absolute: 0.1 10*3/uL (ref 0.0–0.1)
Basophils Relative: 1.2 % (ref 0.0–3.0)
Eosinophils Absolute: 0.2 10*3/uL (ref 0.0–0.7)
Eosinophils Relative: 2.5 % (ref 0.0–5.0)
HCT: 29.4 % — ABNORMAL LOW (ref 39.0–52.0)
Hemoglobin: 9.3 g/dL — ABNORMAL LOW (ref 13.0–17.0)
Lymphocytes Relative: 23.6 % (ref 12.0–46.0)
Lymphs Abs: 1.9 10*3/uL (ref 0.7–4.0)
MCHC: 31.8 g/dL (ref 30.0–36.0)
MCV: 91.6 fl (ref 78.0–100.0)
Monocytes Absolute: 0.4 10*3/uL (ref 0.1–1.0)
Monocytes Relative: 4.5 % (ref 3.0–12.0)
Neutro Abs: 5.5 10*3/uL (ref 1.4–7.7)
Neutrophils Relative %: 68.2 % (ref 43.0–77.0)
Platelets: 254 10*3/uL (ref 150.0–400.0)
RBC: 3.21 Mil/uL — ABNORMAL LOW (ref 4.22–5.81)
RDW: 22.1 % — ABNORMAL HIGH (ref 11.5–15.5)
WBC: 8 10*3/uL (ref 4.0–10.5)

## 2022-06-18 LAB — TSH: TSH: 46.12 u[IU]/mL — ABNORMAL HIGH (ref 0.35–5.50)

## 2022-06-18 NOTE — Progress Notes (Signed)
Subjective:     Patient ID: [name], male    DOB: 05/15/1934, 89 y.o.   MRN: [id number]  Chief Complaint  Patient presents with   Hospitalization Follow-up    Still very dizzy since hospital, especially in the morning. Gets dizzy just sitting down.     HPI Patient is in today for hospital discharge follow up visit.    Caregiver/family member states they stopped all of his medications and he has not taken any of them since being discharged home. States she was confused on which medications and doses to give him.   Aspirin and Plavix are being held until 06/23/2022 per discharge summary.   Patient was dizzy this morning but not currently. Ambulating with a cane.   Bleeding from left arm has stopped.   Received 1 unit of packed RBCs in hospital.  Anemia improved.   TSH was elevated - has not been taking levothyroxine.   Kidney injury while hospitalized and improving upon discharge.   Denies any new concerns.   Denies fever, chills, chest pain, palpitations, shortness of breath, abdominal pain, N/V/D.      Health Maintenance Due  Topic Date Due   Medicare Annual Wellness (AWV)  Never done   HEMOGLOBIN A1C  06/16/1934   FOOT EXAM  Never done   OPHTHALMOLOGY EXAM  Never done   Zoster Vaccines- Shingrix (1 of 2) Never done   COVID-19 Vaccine (5 - 2023-24 season) 01/30/2022    Past Medical History:  Diagnosis Date   Atrial flutter ([place])    (I could not find documentation of this rhythm.)   AUTOMATIC IMPLANTABLE CARDIAC DEFIBRILLATOR SITU    Automatic implantable cardioverter-defibrillator in situ 12/05/2009   Qualifier: Diagnosis of  By: [name], MD, [facility], Binnie Kand    CAD (coronary artery disease) 05/22/2011   Carotid stenosis 05/22/2011   CHF CONGESTIVE HEART FAILURE    45% by echo 2015   COPD 12/01/2007   Qualifier: Diagnosis of  By: [name] NP, [name]     Dementia ([place])    per pt's stepdaughter   DM2 (diabetes mellitus, type 2) ([place])    pt's  stepdaughter said he is no longer Diabetic, and does not take medication for it   DYSLIPIDEMIA    DYSPNEA    Edema    Essential hypertension 11/17/2007   Qualifier: Diagnosis of  By: [name]     GERD (gastroesophageal reflux disease)    Gout    HYPERTENSION    HYPOTHYROIDISM    MYOCARDIAL INFARCTION    MI 1999, stent x 2 to RCA, 70% circ, 30 and 40 % LADs   S/P updgrade to CRT-D 11/23/2020   WEIGHT GAIN, ABNORMAL     Past Surgical History:  Procedure Laterality Date   ANGIOPLASTY     stent   APPENDECTOMY  1957   BIV UPGRADE N/A 11/22/2020   Procedure: BIV ICD UPGRADE;  Surgeon: Evans Lance, MD;  Location: [place] [facility];  Service: Cardiovascular;  Laterality: N/A;   CARDIAC DEFIBRILLATOR PLACEMENT     CAROTID STENT     EYE SURGERY  1985   INSERTION OF ILIAC STENT N/A 12/11/2021   Procedure: LOWER EXTREMITY ANGIOGRAM WITH COMMON FEMORAL ARTERY AND PROFUNDA  ARTERY STENTING;  Surgeon: Cherre Robins, MD;  Location: [place];  Service: Vascular;  Laterality: N/A;   LOWER EXTREMITY ANGIOGRAPHY N/A 05/04/2018   Procedure: LOWER EXTREMITY ANGIOGRAPHY;  Surgeon: [name], MD;  Location: [place] [facility];  Service: Cardiovascular;  Laterality: N/A;   PERIPHERAL VASCULAR INTERVENTION Bilateral 05/04/2018   Procedure: PERIPHERAL VASCULAR INTERVENTION;  Surgeon: [name], MD;  Location: [place] [facility];  Service: Cardiovascular;  Laterality: Bilateral;  [place]   ULTRASOUND GUIDANCE FOR VASCULAR ACCESS Left 12/11/2021   Procedure: ULTRASOUND GUIDANCE FOR VASCULAR ACCESS;  Surgeon: Cherre Robins, MD;  Location[facility];  Service: Vascular;  Laterality: Left;    Family History  Problem Relation Age of Onset   Other Mother        natural causes   Kidney disease Father    Cancer Brother        throat cancer    Social History   Socioeconomic History   Marital status: Married    Spouse name: Ila   Number of children: 2   Years of education: Not  on file   Highest education level: Not on file  Occupational History   Not on file  Tobacco Use   Smoking status: Former    Types: Cigarettes    Quit date: 12/10/1983    Years since quitting: 38.5   Smokeless tobacco: Never   Tobacco comments:    quit 1985  Vaping Use   Vaping Use: Never used  Substance and Sexual Activity   Alcohol use: No   Drug use: No   Sexual activity: Not on file  Other Topics Concern   Not on file  Social History Narrative   1 stepdaughter, Mardene Celeste. 1 son who is not around and not in contact with his father. Pt's wife has alzheimers   Social Determinants of Radio broadcast assistant Strain: Not on file  Food Insecurity: No Food Insecurity (06/15/2022)   Hunger Vital Sign    Worried About Running Out of Food in the Last Year: Never true    Ran Out of Food in the Last Year: Never true  Transportation Needs: No Transportation Needs (06/15/2022)   PRAPARE - Hydrologist (Medical): No    Lack of Transportation (Non-Medical): No  Physical Activity: Not on file  Stress: Not on file  Social Connections: Not on file  Intimate Partner Violence: Not At Risk (06/11/2022)   Humiliation, Afraid, Rape, and Kick questionnaire    Fear of Current or Ex-Partner: No    Emotionally Abused: No    Physically Abused: No    Sexually Abused: No    Outpatient Medications Prior to Visit  Medication Sig Dispense Refill   acetaminophen (TYLENOL) 650 MG CR tablet Take 650 mg by mouth every 8 (eight) hours as needed for pain. (Patient not taking: Reported on 06/18/2022)     aspirin EC 81 MG tablet Take 1 tablet (81 mg total) by mouth in the morning. Swallow whole. (Patient not taking: Reported on 06/18/2022) 30 tablet 11   atorvastatin (LIPITOR) 40 MG tablet TAKE 1 TABLET (40 MG TOTAL) BY MOUTH IN THE MORNING. (Patient not taking: Reported on 06/18/2022) 90 tablet 3   [START ON 06/22/2022] clopidogrel (PLAVIX) 75 MG tablet Take 1 tablet (75 mg total)  by mouth in the morning. (Patient not taking: Reported on 06/18/2022) 90 tablet 1   docusate sodium (COLACE) 100 MG capsule Take 1 capsule (100 mg total) by mouth 2 (two) times daily as needed for mild constipation. (Patient not taking: Reported on 06/18/2022) 10 capsule 0   famotidine (PEPCID) 40 MG tablet Take 40 mg by mouth 2 (two) times daily. (Patient not taking: Reported on 06/11/2022)  folic acid (FOLVITE) 1 MG tablet Take 1 tablet (1 mg total) by mouth daily. (Patient not taking: Reported on 06/18/2022) 30 tablet 0   furosemide (LASIX) 20 MG tablet Take 1 tablet (20 mg total) by mouth in the morning. (Patient not taking: Reported on 06/18/2022) 90 tablet 3   hydrALAZINE (APRESOLINE) 10 MG tablet Take 1 tablet (10 mg total) by mouth in the morning and at bedtime. (Patient not taking: Reported on 06/18/2022) 180 tablet 3   isosorbide mononitrate (IMDUR) 30 MG 24 hr tablet Take 1 tablet (30 mg total) by mouth daily. 90 tablet 3   levothyroxine (SYNTHROID) 75 MCG tablet Take 3 tablets (225 mcg total) by mouth in the morning. (Patient not taking: Reported on 06/18/2022) 90 tablet 0   metoprolol succinate (TOPROL-XL) 50 MG 24 hr tablet Take 1 tablet (50 mg total) by mouth 2 (two) times daily. Take with or immediately following a meal. (Patient not taking: Reported on 06/18/2022) 60 tablet 0   mirtazapine (REMERON) 30 MG tablet Take 1 tablet (30 mg total) by mouth at bedtime. (Patient not taking: Reported on 06/18/2022) 90 tablet 1   montelukast (SINGULAIR) 10 MG tablet TAKE 1 TABLET BY MOUTH EVERY DAY (Patient not taking: Reported on 06/18/2022) 90 tablet 1   Multiple Vitamin (MULTIVITAMIN WITH MINERALS) TABS tablet Take 1 tablet by mouth daily. (Patient not taking: Reported on 06/18/2022) 30 tablet 0   pantoprazole (PROTONIX) 40 MG tablet Take 40 mg by mouth in the morning and at bedtime. (Patient not taking: Reported on 06/18/2022)     REFRESH OPTIVE PF 0.5-0.9 % SOLN Place 1 drop into both eyes daily.  (Patient not taking: Reported on 06/18/2022)     TUMS E-X 750 750 MG chewable tablet Chew 1-4 tablets by mouth daily. (Patient not taking: Reported on 06/18/2022)     Vaseline Petrolatum Gauze (VASELINE GAUZE) Apply topically daily. (Patient not taking: Reported on 06/18/2022) 15 each 0   WIXELA INHUB 250-50 MCG/ACT AEPB Inhale 1 puff into the lungs in the morning and at bedtime. (Patient not taking: Reported on 06/18/2022)     No facility-administered medications prior to visit.    Allergies  Allergen Reactions   Tape Other (See Comments)    SKIN IS VERY DELICATE AND WILL TEAR EASILY!!!!   Prednisone Rash   Sulfa Antibiotics Other (See Comments)    "Hyper"    ROS     Objective:    Physical Exam Constitutional:      General: He is not in acute distress.    Appearance: He is not ill-appearing.  HENT:     Mouth/Throat:     Mouth: Mucous membranes are moist.  Cardiovascular:     Rate and Rhythm: Normal rate.  Pulmonary:     Effort: Pulmonary effort is normal.  Skin:    General: Skin is warm and dry.     Comments: Dressing on left forearm that is clean and dry.   Neurological:     Mental Status: He is alert. Mental status is at baseline.  Psychiatric:        Mood and Affect: Mood normal.        Behavior: Behavior normal.     BP (!) 142/66 (BP Location: Right Arm, Patient Position: Sitting, Cuff Size: Large)   Pulse 66   Temp 97.8 F (36.6 C) (Temporal)   Ht '5\' 7"'$  (1.702 m)   Wt 157 lb (71.2 kg)   SpO2 96%   BMI 24.59 kg/m  Wt  Readings from Last 3 Encounters:  06/18/22 157 lb (71.2 kg)  06/11/22 152 lb (68.9 kg)  06/11/22 152 lb (68.9 kg)       Assessment & Plan:   Problem List Items Addressed This Visit       Cardiovascular and Mediastinum   Essential hypertension     Endocrine   Hypothyroidism   Relevant Orders   TSH (Completed)     Other   Dizziness   Relevant Orders   CBC with Differential/Platelet (Completed)   Basic metabolic panel (Completed)    History of recent blood transfusion   Hospital discharge follow-up - Primary   Symptomatic anemia   Relevant Orders   CBC with Differential/Platelet (Completed)   Other Visit Diagnoses     Chronic kidney disease, unspecified CKD stage       Relevant Orders   Basic metabolic panel (Completed)      He is seeing me today for a hospital discharge follow up for asymptomatic anemia and bleeding from forearm. Bleeding has stopped.  Reviewed hospital discharge summary.  Patient's caregiver/family member reports that patient has not received any medications since being discharged home due to not knowing which medications to take.  Medications discussed and advised to start on medications as recommended at discharge. Recheck labs.  Follow up as scheduled with PCP next week.   I am having Aras W. Vint maintain his pantoprazole, acetaminophen, hydrALAZINE, isosorbide mononitrate, furosemide, atorvastatin, famotidine, montelukast, Tums E-X 750, Refresh Optive PF, Wixela Inhub, aspirin EC, clopidogrel, docusate sodium, folic acid, metoprolol succinate, mirtazapine, multivitamin with minerals, levothyroxine, and Vaseline Petrolatum Gauze.  No orders of the defined types were placed in this encounter.

## 2022-06-18 NOTE — Telephone Encounter (Signed)
        Patient  visited [facility] on 1/7     Telephone encounter attempt : 2nd   A HIPAA compliant voice message was left requesting a return call.  Instructed patient to call back .    [facility], Care Management  [phone] [address], [place], [place] [address] Phone: [phone] Email: [email or web address]

## 2022-06-18 NOTE — Progress Notes (Signed)
His thyroid function is abnormal (much worse) and he needs to start back on his levothyroxine. Otherwise, his labs are stable. Anemia has improved.

## 2022-06-18 NOTE — Patient Instructions (Signed)
Please go downstairs for labs.   We will be in touch with your results.   Start back on medications as recommended by hospital discharge provider.  Let us know if you have questions.   Follow up as scheduled next week with Dr. Ronnald Ramp.

## 2022-06-23 ENCOUNTER — Encounter: Payer: Self-pay | Admitting: Internal Medicine

## 2022-06-23 ENCOUNTER — Ambulatory Visit (INDEPENDENT_AMBULATORY_CARE_PROVIDER_SITE_OTHER): Payer: Medicare Other | Admitting: Internal Medicine

## 2022-06-23 ENCOUNTER — Ambulatory Visit: Payer: Medicare Other | Attending: Internal Medicine | Admitting: Internal Medicine

## 2022-06-23 VITALS — BP 136/64 | HR 69 | Temp 98.2°F | Ht 67.0 in | Wt 157.0 lb

## 2022-06-23 VITALS — BP 134/48 | HR 51 | Ht 67.0 in | Wt 156.8 lb

## 2022-06-23 DIAGNOSIS — I5022 Chronic systolic (congestive) heart failure: Secondary | ICD-10-CM

## 2022-06-23 DIAGNOSIS — Z95810 Presence of automatic (implantable) cardiac defibrillator: Secondary | ICD-10-CM

## 2022-06-23 DIAGNOSIS — N1832 Chronic kidney disease, stage 3b: Secondary | ICD-10-CM | POA: Diagnosis not present

## 2022-06-23 DIAGNOSIS — I2510 Atherosclerotic heart disease of native coronary artery without angina pectoris: Secondary | ICD-10-CM | POA: Diagnosis not present

## 2022-06-23 DIAGNOSIS — D538 Other specified nutritional anemias: Secondary | ICD-10-CM | POA: Diagnosis not present

## 2022-06-23 DIAGNOSIS — D539 Nutritional anemia, unspecified: Secondary | ICD-10-CM

## 2022-06-23 DIAGNOSIS — E039 Hypothyroidism, unspecified: Secondary | ICD-10-CM

## 2022-06-23 DIAGNOSIS — I10 Essential (primary) hypertension: Secondary | ICD-10-CM

## 2022-06-23 LAB — FOLATE: Folate: 16 ng/mL (ref >5.9)

## 2022-06-23 LAB — CBC WITH DIFFERENTIAL/PLATELET
Basophils Absolute: 0.1 10*3/uL (ref 0.0–0.1)
Basophils Relative: 1.2 % (ref 0.0–3.0)
Eosinophils Absolute: 0.3 10*3/uL (ref 0.0–0.7)
Eosinophils Relative: 4.5 % (ref 0.0–5.0)
HCT: 29 % — ABNORMAL LOW (ref 39.0–52.0)
Hemoglobin: 9.1 g/dL — ABNORMAL LOW (ref 13.0–17.0)
Lymphocytes Relative: 34.5 % (ref 12.0–46.0)
Lymphs Abs: 2.1 10*3/uL (ref 0.7–4.0)
MCHC: 31.5 g/dL (ref 30.0–36.0)
MCV: 92 fl (ref 78.0–100.0)
Monocytes Absolute: 0.3 10*3/uL (ref 0.1–1.0)
Monocytes Relative: 4.9 % (ref 3.0–12.0)
Neutro Abs: 3.3 10*3/uL (ref 1.4–7.7)
Neutrophils Relative %: 54.9 % (ref 43.0–77.0)
Platelets: 244 10*3/uL (ref 150.0–400.0)
RBC: 3.15 Mil/uL — ABNORMAL LOW (ref 4.22–5.81)
RDW: 22.9 % — ABNORMAL HIGH (ref 11.5–15.5)
WBC: 6 10*3/uL (ref 4.0–10.5)

## 2022-06-23 NOTE — Progress Notes (Signed)
Subjective:  Patient ID: [name], male    DOB: 10/02/1933  Age: 89 y.o. MRN: [id number]  CC: Anemia, Hypothyroidism, and Congestive Heart Failure   HPI Matix Henshaw Ohm presents for f/up -    He was recently admitted for symptomatic anemia and acute kidney injury.  He tells me he has improved since discharge with an increased appetite.  His lower extremity edema has improved.  He complains of constipation but is getting adequate symptom relief with over-the-counter remedies.  According to his daughter he is taking the thyroid supplement.   Patient: [name] MRN: [id number] DOB: 1933-11-15  Admit date:     06/11/2022  Discharge date: 06/13/2022  Discharge Physician: Berle Mull  PCP: Janith Lima, MD   Recommendations at discharge:  Follow up with PCP in 1 week     Follow-up Information       Janith Lima, MD. Schedule an appointment as soon as possible for a visit in 1 week(s).   Specialty: Internal Medicine Contact information: [place] [id number] [phone]                        Discharge Diagnoses: Principal Problem:   Symptomatic anemia Active Problems:   Hypothyroidism   Chronic systolic congestive heart failure (HCC)   Other specified chronic obstructive pulmonary disease   Coronary artery disease   Hypertension   Extrinsic asthma   AKI (acute kidney injury) [facility])   Hospital Course: PMH of A-fib/a flutter, CAD, PVD, chronic HFrEF SP AICD implant, HTN, GERD, type II DM, HLD present to the hospital with complaints of dizziness.  Patient sustained injury to his elbow 2 weeks ago with significant bleeding.  Reportedly ED there in [place], cauterized a vein.  Started to have worsening fatigue and shortness of breath.  Found to have symptomatic anemia.  Also AKI.   Assessment and Plan  Symptomatic anemia. From ongoing use of aspirin and Plavix in the setting of recent injury. Last use of aspirin was a few days  ago. Last use of Plavix was Wednesday. We held both of them with plans to resume both of them gradually laer SP 1 PRBC transfusion with adequate hemoglobin response. Iron level normal.  B12 level normal. Wound does not appear to be bleeding anymore.   Acute kidney injury. Baseline serum creatinine around 1.4. Last serum creatinine in June was 1.7. On admission serum creatinine 2.2.  Now improving   Orthostatic hypotension. Sinus bradycardia. Patient has been having frequent episodes of orthostatic hypotension. Reassuringly his blood pressure is soft here but not orthostatic. Reducing the dose of the Toprol-XL from 100 twice daily to 50 twice daily per recent plan from cardiology with the evidence of bradycardia with heart rate in 40s and 50s in the hospital.   Atrial flutter. Not on any long-term anticoagulation. On metoprolol 100 mg twice daily.  Currently reducing to 50 twice daily.   Hypothyroidism. On Synthroid 200 mcg. TSH still significantly elevated. free T4 low.  Increase the dose to 225 mcg daily.     Outpatient Medications Prior to Visit  Medication Sig Dispense Refill   acetaminophen (TYLENOL) 650 MG CR tablet Take 650 mg by mouth every 8 (eight) hours as needed for pain.     aspirin EC 81 MG tablet Take 1 tablet (81 mg total) by mouth in the morning. Swallow whole. 30 tablet 11   atorvastatin (LIPITOR) 40 MG tablet TAKE 1 TABLET (  40 MG TOTAL) BY MOUTH IN THE MORNING. 90 tablet 3   clopidogrel (PLAVIX) 75 MG tablet Take 1 tablet (75 mg total) by mouth in the morning. 90 tablet 1   docusate sodium (COLACE) 100 MG capsule Take 1 capsule (100 mg total) by mouth 2 (two) times daily as needed for mild constipation. 10 capsule 0   famotidine (PEPCID) 40 MG tablet Take 40 mg by mouth 2 (two) times daily.     folic acid (FOLVITE) 1 MG tablet Take 1 tablet (1 mg total) by mouth daily. 30 tablet 0   furosemide (LASIX) 20 MG tablet Take 1 tablet (20 mg total) by mouth in the  morning. 90 tablet 3   hydrALAZINE (APRESOLINE) 10 MG tablet Take 1 tablet (10 mg total) by mouth in the morning and at bedtime. 180 tablet 3   levothyroxine (SYNTHROID) 75 MCG tablet Take 3 tablets (225 mcg total) by mouth in the morning. 90 tablet 0   metoprolol succinate (TOPROL-XL) 50 MG 24 hr tablet Take 1 tablet (50 mg total) by mouth 2 (two) times daily. Take with or immediately following a meal. 60 tablet 0   mirtazapine (REMERON) 30 MG tablet Take 1 tablet (30 mg total) by mouth at bedtime. 90 tablet 1   montelukast (SINGULAIR) 10 MG tablet TAKE 1 TABLET BY MOUTH EVERY DAY 90 tablet 1   Multiple Vitamin (MULTIVITAMIN WITH MINERALS) TABS tablet Take 1 tablet by mouth daily. 30 tablet 0   pantoprazole (PROTONIX) 40 MG tablet Take 40 mg by mouth in the morning and at bedtime.     REFRESH OPTIVE PF 0.5-0.9 % SOLN Place 1 drop into both eyes daily.     TUMS E-X 750 750 MG chewable tablet Chew 1-4 tablets by mouth daily.     Vaseline Petrolatum Gauze (VASELINE GAUZE) Apply topically daily. 15 each 0   WIXELA INHUB 250-50 MCG/ACT AEPB Inhale 1 puff into the lungs in the morning and at bedtime.     isosorbide mononitrate (IMDUR) 30 MG 24 hr tablet Take 1 tablet (30 mg total) by mouth daily. 90 tablet 3   No facility-administered medications prior to visit.    ROS Review of Systems  Constitutional:  Positive for fatigue. Negative for appetite change, chills, diaphoresis and unexpected weight change.  HENT: Negative.    Eyes: Negative.   Respiratory:  Negative for cough, chest tightness, shortness of breath and wheezing.   Cardiovascular:  Negative for chest pain, palpitations and leg swelling.  Gastrointestinal:  Positive for constipation. Negative for abdominal pain, blood in stool, diarrhea, nausea and vomiting.  Endocrine: Negative.   Genitourinary: Negative.  Negative for difficulty urinating.  Musculoskeletal: Negative.   Skin:  Positive for pallor.  Neurological: Negative.   Negative for dizziness, weakness, light-headedness and numbness.  Hematological:  Negative for adenopathy. Does not bruise/bleed easily.  Psychiatric/Behavioral: Negative.      Objective:  BP 136/64 (BP Location: Right Arm, Patient Position: Sitting, Cuff Size: Large)   Pulse 69   Temp 98.2 F (36.8 C) (Oral)   Ht '5\' 7"'$  (1.702 m)   Wt 157 lb (71.2 kg)   SpO2 94%   BMI 24.59 kg/m   BP Readings from Last 3 Encounters:  06/23/22 (!) 134/48  06/23/22 136/64  06/18/22 (!) 142/66    Wt Readings from Last 3 Encounters:  06/23/22 156 lb 12.8 oz (71.1 kg)  06/23/22 157 lb (71.2 kg)  06/18/22 157 lb (71.2 kg)    Physical Exam Vitals reviewed.  Constitutional:      General: He is not in acute distress.    Appearance: He is ill-appearing. He is not toxic-appearing or diaphoretic.  HENT:     Nose: Nose normal.     Mouth/Throat:     Mouth: Mucous membranes are moist.  Eyes:     General: No scleral icterus.    Pupils: Pupils are equal, round, and reactive to light.  Cardiovascular:     Rate and Rhythm: Normal rate and regular rhythm.     Pulses: Normal pulses.     Heart sounds: No murmur heard.    No friction rub. No gallop.  Pulmonary:     Effort: Pulmonary effort is normal.     Breath sounds: No stridor. No wheezing, rhonchi or rales.  Abdominal:     General: Abdomen is flat.     Palpations: There is no mass.     Tenderness: There is no abdominal tenderness. There is no guarding.     Hernia: No hernia is present.  Musculoskeletal:        General: Normal range of motion.     Cervical back: Neck supple.     Right lower leg: 1+ Pitting Edema present.     Left lower leg: 1+ Pitting Edema present.  Skin:    General: Skin is warm.     Coloration: Skin is pale.     Findings: No erythema.  Neurological:     General: No focal deficit present.     Mental Status: He is alert.  Psychiatric:        Mood and Affect: Mood normal.        Behavior: Behavior normal.     Lab  Results  Component Value Date   WBC 6.0 06/23/2022   HGB 9.1 (L) 06/23/2022   HCT 29.0 (L) 06/23/2022   PLT 244.0 06/23/2022   GLUCOSE 90 06/18/2022   CHOL 112 04/30/2021   TRIG 69 04/30/2021   HDL 41 04/30/2021   LDLCALC 56 04/30/2021   ALT 8 06/11/2022   AST 19 06/11/2022   NA 135 06/18/2022   K 5.1 06/18/2022   CL 101 06/18/2022   CREATININE 1.57 (H) 06/18/2022   BUN 25 (H) 06/18/2022   CO2 27 06/18/2022   TSH 46.12 (H) 06/18/2022   INR 1.3 (H) 06/11/2022    No results found.  Assessment & Plan:   Keavon was seen today for anemia, hypothyroidism and congestive heart failure.  Diagnoses and all orders for this visit:  Deficiency anemia- Will evaluate for vitamin deficiencies. -     CBC with Differential/Platelet; Future -     Reticulocytes; Future -     Vitamin B1; Future -     Zinc; Future -     Folate; Future -     [id number] -     Zinc -     Vitamin B1 -     Reticulocytes -     CBC with Differential/Platelet  Acquired hypothyroidism- He is compliant with T4.  Essential hypertension- His blood pressure is adequately well-controlled.  Stage 3b chronic kidney disease ([place])- His renal function is stable.  Anemia due to zinc deficiency -     zinc gluconate 50 MG tablet; Take 1 tablet (50 mg total) by mouth daily.   I am having Olivier W. Motta start on zinc gluconate. I am also having him maintain his pantoprazole, acetaminophen, hydrALAZINE, isosorbide mononitrate, furosemide, atorvastatin, famotidine, montelukast, Tums E-X 750, Refresh Optive PF, Wixela Inhub, aspirin  EC, clopidogrel, docusate sodium, folic acid, metoprolol succinate, mirtazapine, multivitamin with minerals, levothyroxine, and Vaseline Petrolatum Gauze.  Meds ordered this encounter  Medications   zinc gluconate 50 MG tablet    Sig: Take 1 tablet (50 mg total) by mouth daily.    Dispense:  90 tablet    Refill:  1     Follow-up: Return in about 3 months (around 09/22/2022).  [name], MD

## 2022-06-23 NOTE — Patient Instructions (Signed)

## 2022-06-23 NOTE — Progress Notes (Signed)
HPI [name] returns today for followup. He is a pleasant 89 yo man with a h/o CAD, chronic systolic heart failure who underwent ICD insertion almost 20 years ago. He had 2 ICD shock with his first device, none since. He has developed worsening CHF. Attempts at uptitration of medical therapy with entresto resulted in hypotension. He underwent biv ICD insertion about 6 months ago. He has had problems with peripheral edema. He has a chronically elevated RV pacing threshold. Allergies  Allergen Reactions   Tape Other (See Comments)    SKIN IS VERY DELICATE AND WILL TEAR EASILY!!!!   Prednisone Rash   Sulfa Antibiotics Other (See Comments)    "Hyper"     Current Outpatient Medications  Medication Sig Dispense Refill   acetaminophen (TYLENOL) 650 MG CR tablet Take 650 mg by mouth every 8 (eight) hours as needed for pain.     aspirin EC 81 MG tablet Take 1 tablet (81 mg total) by mouth in the morning. Swallow whole. 30 tablet 11   atorvastatin (LIPITOR) 40 MG tablet TAKE 1 TABLET (40 MG TOTAL) BY MOUTH IN THE MORNING. 90 tablet 3   clopidogrel (PLAVIX) 75 MG tablet Take 1 tablet (75 mg total) by mouth in the morning. 90 tablet 1   docusate sodium (COLACE) 100 MG capsule Take 1 capsule (100 mg total) by mouth 2 (two) times daily as needed for mild constipation. 10 capsule 0   famotidine (PEPCID) 40 MG tablet Take 40 mg by mouth 2 (two) times daily.     folic acid (FOLVITE) 1 MG tablet Take 1 tablet (1 mg total) by mouth daily. 30 tablet 0   furosemide (LASIX) 20 MG tablet Take 1 tablet (20 mg total) by mouth in the morning. 90 tablet 3   hydrALAZINE (APRESOLINE) 10 MG tablet Take 1 tablet (10 mg total) by mouth in the morning and at bedtime. 180 tablet 3   levothyroxine (SYNTHROID) 75 MCG tablet Take 3 tablets (225 mcg total) by mouth in the morning. 90 tablet 0   metoprolol succinate (TOPROL-XL) 50 MG 24 hr tablet Take 1 tablet (50 mg total) by mouth 2 (two) times daily. Take with or  immediately following a meal. 60 tablet 0   mirtazapine (REMERON) 30 MG tablet Take 1 tablet (30 mg total) by mouth at bedtime. 90 tablet 1   montelukast (SINGULAIR) 10 MG tablet TAKE 1 TABLET BY MOUTH EVERY DAY 90 tablet 1   Multiple Vitamin (MULTIVITAMIN WITH MINERALS) TABS tablet Take 1 tablet by mouth daily. 30 tablet 0   pantoprazole (PROTONIX) 40 MG tablet Take 40 mg by mouth in the morning and at bedtime.     REFRESH OPTIVE PF 0.5-0.9 % SOLN Place 1 drop into both eyes daily.     TUMS E-X 750 750 MG chewable tablet Chew 1-4 tablets by mouth daily.     Vaseline Petrolatum Gauze (VASELINE GAUZE) Apply topically daily. 15 each 0   WIXELA INHUB 250-50 MCG/ACT AEPB Inhale 1 puff into the lungs in the morning and at bedtime.     isosorbide mononitrate (IMDUR) 30 MG 24 hr tablet Take 1 tablet (30 mg total) by mouth daily. 90 tablet 3   No current facility-administered medications for this visit.     Past Medical History:  Diagnosis Date   Atrial flutter ([place])    (I could not find documentation of this rhythm.)   AUTOMATIC IMPLANTABLE CARDIAC DEFIBRILLATOR SITU    Automatic implantable cardioverter-defibrillator in  situ 12/05/2009   Qualifier: Diagnosis of  By: [name], MD, [facility], Binnie Kand    CAD (coronary artery disease) 05/22/2011   Carotid stenosis 05/22/2011   CHF CONGESTIVE HEART FAILURE    45% by echo 2015   COPD 12/01/2007   Qualifier: Diagnosis of  By: [name] NP, [name]     Dementia ([place])    per pt's stepdaughter   DM2 (diabetes mellitus, type 2) ([place])    pt's stepdaughter said he is no longer Diabetic, and does not take medication for it   DYSLIPIDEMIA    DYSPNEA    Edema    Essential hypertension 11/17/2007   Qualifier: Diagnosis of  By: [name]     GERD (gastroesophageal reflux disease)    Gout    HYPERTENSION    HYPOTHYROIDISM    MYOCARDIAL INFARCTION    MI 1999, stent x 2 to RCA, 70% circ, 30 and 40 % LADs   S/P updgrade to CRT-D 11/23/2020   WEIGHT  GAIN, ABNORMAL     ROS:   All systems reviewed and negative except as noted in the HPI.   Past Surgical History:  Procedure Laterality Date   ANGIOPLASTY     stent   APPENDECTOMY  1957   BIV UPGRADE N/A 11/22/2020   Procedure: BIV ICD UPGRADE;  Surgeon: Evans Lance, MD;  Location: [facility];  Service: Cardiovascular;  Laterality: N/A;   CARDIAC DEFIBRILLATOR PLACEMENT     CAROTID STENT     EYE SURGERY  1985   INSERTION OF ILIAC STENT N/A 12/11/2021   Procedure: LOWER EXTREMITY ANGIOGRAM WITH COMMON FEMORAL ARTERY AND PROFUNDA  ARTERY STENTING;  Surgeon: Cherre Robins, MD;  Location: [facility];  Service: Vascular;  Laterality: N/A;   LOWER EXTREMITY ANGIOGRAPHY N/A 05/04/2018   Procedure: LOWER EXTREMITY ANGIOGRAPHY;  Surgeon: [name], MD;  Location: [facility];  Service: Cardiovascular;  Laterality: N/A;   PERIPHERAL VASCULAR INTERVENTION Bilateral 05/04/2018   Procedure: PERIPHERAL VASCULAR INTERVENTION;  Surgeon: [name], MD;  Location: [facility];  Service: Cardiovascular;  Laterality: Bilateral;  [place]   ULTRASOUND GUIDANCE FOR VASCULAR ACCESS Left 12/11/2021   Procedure: ULTRASOUND GUIDANCE FOR VASCULAR ACCESS;  Surgeon: Cherre Robins, MD;  Location: [facility];  Service: Vascular;  Laterality: Left;     Family History  Problem Relation Age of Onset   Other Mother        natural causes   Kidney disease Father    Cancer Brother        throat cancer     Social History   Socioeconomic History   Marital status: Married    Spouse name: Ila   Number of children: 2   Years of education: Not on file   Highest education level: Not on file  Occupational History   Not on file  Tobacco Use   Smoking status: Former    Types: Cigarettes    Quit date: 12/10/1983    Years since quitting: 38.5   Smokeless tobacco: Never   Tobacco comments:    quit 1985  Vaping Use   Vaping Use: Never used  Substance and Sexual Activity    Alcohol use: No   Drug use: No   Sexual activity: Not on file  Other Topics Concern   Not on file  Social History Narrative   1 stepdaughter, Mardene Celeste. 1 son who is not around and not in contact with his father. Pt's wife has alzheimers  Social Determinants of Health   Financial Resource Strain: Not on file  Food Insecurity: No Food Insecurity (06/15/2022)   Hunger Vital Sign    Worried About Running Out of Food in the Last Year: Never true    Ran Out of Food in the Last Year: Never true  Transportation Needs: No Transportation Needs (06/15/2022)   PRAPARE - Hydrologist (Medical): No    Lack of Transportation (Non-Medical): No  Physical Activity: Not on file  Stress: Not on file  Social Connections: Not on file  Intimate Partner Violence: Not At Risk (06/11/2022)   Humiliation, Afraid, Rape, and Kick questionnaire    Fear of Current or Ex-Partner: No    Emotionally Abused: No    Physically Abused: No    Sexually Abused: No     BP (!) 134/48   Pulse (!) 51   Ht '5\' 7"'$  (1.702 m)   Wt 156 lb 12.8 oz (71.1 kg)   SpO2 98%   BMI 24.56 kg/m   Physical Exam:  Well appearing NAD HEENT: Unremarkable Neck:  No JVD, no thyromegally Lymphatics:  No adenopathy Back:  No CVA tenderness Lungs:  Clear HEART:  Regular rate rhythm, no murmurs, no rubs, no clicks Abd:  soft, positive bowel sounds, no organomegally, no rebound, no guarding Ext:  2 plus pulses, 2+ edema, no cyanosis, no clubbing Skin:  No rashes no nodules Neuro:  CN II through XII intact, motor grossly intact   DEVICE  Normal device function.  See PaceArt for details.   Assess/Plan: 1. ICD - he is s/p biv ICD upgrade and appears to be doing well. He has had no ICD therapies. We reprogrammed his device to pace LV only. 2. Chronic systolic heart failure - his symptoms are improved,  class 2A. He is encouraged to continue guideline directed medical therapy. I would like to have him take  more lasix but am concerned about his renal function. 3. CAD - he denies anginal symptoms. He has severe LV dysfunction. 4. Dyslipidemia - he will continue lipitor.   Carleene Overlie Vikki Gains,MD

## 2022-06-23 NOTE — Patient Instructions (Signed)
Medication Instructions:  Your physician recommends that you continue on your current medications as directed. Please refer to the Current Medication list given to you today.  *If you need a refill on your cardiac medications before your next appointment, please call your pharmacy*  Lab Work: None ordered.  If you have labs (blood work) drawn today and your tests are completely normal, you will receive your results only by: [place] (if you have MyChart) OR A paper copy in the mail If you have any lab test that is abnormal or we need to change your treatment, we will call you to review the results.  Testing/Procedures: None ordered.  Follow-Up: At [facility], you and your health needs are our priority.  As part of our continuing mission to provide you with exceptional heart care, we have created designated Provider Care Teams.  These Care Teams include your primary Cardiologist (physician) and Advanced Practice Providers (APPs -  Physician Assistants and Nurse Practitioners) who all work together to provide you with the care you need, when you need it.  We recommend signing up for the patient portal called "MyChart".  Sign up information is provided on this After Visit Summary.  MyChart is used to connect with patients for Virtual Visits (Telemedicine).  Patients are able to view lab/test results, encounter notes, upcoming appointments, etc.  Non-urgent messages can be sent to your provider as well.   To learn more about what you can do with MyChart, go to [email or web address].    Your next appointment:   1 year(s)  The format for your next appointment:   In Person  Provider:   Cristopher Peru, MD{or one of the following Advanced Practice Providers on your designated Care Team:   Tommye Standard, [place] Legrand Como "Jonni Sanger" Chalmers Cater, [place]  Remote monitoring is used to monitor your ICD from home. This monitoring reduces the number of office visits required to check your device to one  time per year. It allows [place] to keep an eye on the functioning of your device to ensure it is working properly. You are scheduled for a device check from home on 08/21/22. You may send your transmission at any time that day. If you have a wireless device, the transmission will be sent automatically. After your physician reviews your transmission, you will receive a postcard with your next transmission date.  Important Information About Sugar

## 2022-06-26 LAB — RETICULOCYTES
ABS Retic: 34430 cells/uL (ref 25000–90000)
Retic Ct Pct: 1.1 %

## 2022-06-26 LAB — ZINC: Zinc: 46 ug/dL — ABNORMAL LOW (ref 60–130)

## 2022-06-26 LAB — VITAMIN B1: Vitamin B1 (Thiamine): 11 nmol/L (ref 8–30)

## 2022-06-29 ENCOUNTER — Telehealth: Payer: Self-pay | Admitting: Internal Medicine

## 2022-06-29 DIAGNOSIS — D538 Other specified nutritional anemias: Secondary | ICD-10-CM | POA: Insufficient documentation

## 2022-06-29 MED ORDER — ZINC GLUCONATE 50 MG PO TABS: 50.0000 mg | ORAL_TABLET | Freq: Every day | ORAL | 1 refills | Status: DC

## 2022-06-29 NOTE — Telephone Encounter (Signed)
Patients daughter called wanting to know the results from patients lab work, DPR is signed and it is okay to speak with daughter, she can be reached at [phone].

## 2022-06-30 NOTE — Telephone Encounter (Signed)
PT's daughter calls today in regards to this. Relayed back ot PT information left in result note.  PT's daughter stated this was fine but there might be trouble convincing PT to start taking this. (PT's daughter stated when she brought up the possibility of him starting this medication he had shown fear of it interacting with other meds.)  CB: [phone]

## 2022-06-30 NOTE — Telephone Encounter (Signed)
See result note for documentation

## 2022-07-03 ENCOUNTER — Telehealth: Payer: Self-pay | Admitting: Gastroenterology

## 2022-07-03 ENCOUNTER — Ambulatory Visit: Payer: Medicare Other | Admitting: Gastroenterology

## 2022-07-03 NOTE — Telephone Encounter (Signed)
Good Morning Dr. Loletha Carrow,    Patients daughter called stating that she needed to reschedule patients appointment with you this afternoon at 1:40 due to patient being sick.   Patient was rescheduled for 3/4 at 3:20.

## 2022-07-13 ENCOUNTER — Other Ambulatory Visit: Payer: Self-pay | Admitting: Internal Medicine

## 2022-07-13 DIAGNOSIS — J4530 Mild persistent asthma, uncomplicated: Secondary | ICD-10-CM

## 2022-07-19 ENCOUNTER — Other Ambulatory Visit: Payer: Self-pay | Admitting: Cardiology

## 2022-07-27 ENCOUNTER — Other Ambulatory Visit: Payer: Self-pay | Admitting: Internal Medicine

## 2022-07-27 DIAGNOSIS — E038 Other specified hypothyroidism: Secondary | ICD-10-CM

## 2022-07-27 DIAGNOSIS — I739 Peripheral vascular disease, unspecified: Secondary | ICD-10-CM

## 2022-07-27 DIAGNOSIS — I6523 Occlusion and stenosis of bilateral carotid arteries: Secondary | ICD-10-CM

## 2022-07-28 ENCOUNTER — Other Ambulatory Visit: Payer: Self-pay | Admitting: Internal Medicine

## 2022-07-28 ENCOUNTER — Telehealth: Payer: Self-pay

## 2022-07-28 DIAGNOSIS — I6523 Occlusion and stenosis of bilateral carotid arteries: Secondary | ICD-10-CM

## 2022-07-28 DIAGNOSIS — I739 Peripheral vascular disease, unspecified: Secondary | ICD-10-CM

## 2022-07-28 MED ORDER — CLOPIDOGREL BISULFATE 75 MG PO TABS: 75.0000 mg | ORAL_TABLET | Freq: Every morning | ORAL | 0 refills | Status: DC

## 2022-07-28 MED ORDER — PANTOPRAZOLE SODIUM 40 MG PO TBEC: 40.0000 mg | DELAYED_RELEASE_TABLET | Freq: Two times a day (BID) | ORAL | 0 refills | Status: DC

## 2022-07-28 MED ORDER — FUROSEMIDE 20 MG PO TABS: 20.0000 mg | ORAL_TABLET | Freq: Every morning | ORAL | 0 refills | Status: DC

## 2022-07-28 NOTE — Addendum Note (Signed)
Addended by: Hinda Kehr on: 07/28/2022 04:43 PM   Modules accepted: Orders

## 2022-07-28 NOTE — Telephone Encounter (Signed)
  pantoprazole (PROTONIX) 40 MG tablet    furosemide (LASIX) 20 MG tablet   Pt is needing a rx refill for the above medication.

## 2022-07-31 NOTE — Progress Notes (Signed)
[facility] Consult Note:  History: [name] 08/03/2022  Referring provider: Janith Lima, MD  Reason for consult/chief complaint: No chief complaint on file.   Subjective  HPI: Patient has had an ED visit on 06/11/22. Per that note, he has a medical history significant of atrial flutter, CAD, history of MI, history of stent placement x 2, chronic systolic heart failure, mild dementia per patient's stepdaughter, type 2 diabetes, hyperlipidemia, essential hypertension, GERD, gout, hypothyroidism.     Patient presents to clinic today for an evaluation of GERD per the request of Dr. [name].   ***   ROS: Review of Systems   Past Medical History: Past Medical History:  Diagnosis Date   Atrial flutter ([place])    (I could not find documentation of this rhythm.)   AUTOMATIC IMPLANTABLE CARDIAC DEFIBRILLATOR SITU    Automatic implantable cardioverter-defibrillator in situ 12/05/2009   Qualifier: Diagnosis of  By: [name], MD, [facility], Binnie Kand    CAD (coronary artery disease) 05/22/2011   Carotid stenosis 05/22/2011   CHF CONGESTIVE HEART FAILURE    45% by echo 2015   COPD 12/01/2007   Qualifier: Diagnosis of  By: [name] NP, [name]     Dementia ([place])    per pt's stepdaughter   DM2 (diabetes mellitus, type 2) ([place])    pt's stepdaughter said he is no longer Diabetic, and does not take medication for it   DYSLIPIDEMIA    DYSPNEA    Edema    Essential hypertension 11/17/2007   Qualifier: Diagnosis of  By: [name]     GERD (gastroesophageal reflux disease)    Gout    HYPERTENSION    HYPOTHYROIDISM    MYOCARDIAL INFARCTION    MI 1999, stent x 2 to RCA, 70% circ, 30 and 40 % LADs   S/P updgrade to CRT-D 11/23/2020   WEIGHT GAIN, ABNORMAL      Past Surgical History: Past Surgical History:  Procedure Laterality Date   ANGIOPLASTY     stent   APPENDECTOMY  1957   BIV UPGRADE N/A 11/22/2020   Procedure: BIV ICD UPGRADE;  Surgeon:  Evans Lance, MD;  Location: [facility];  Service: Cardiovascular;  Laterality: N/A;   CARDIAC DEFIBRILLATOR PLACEMENT     CAROTID STENT     EYE SURGERY  1985   INSERTION OF ILIAC STENT N/A 12/11/2021   Procedure: LOWER EXTREMITY ANGIOGRAM WITH COMMON FEMORAL ARTERY AND PROFUNDA  ARTERY STENTING;  Surgeon: Cherre Robins, MD;  Location: [facility];  Service: Vascular;  Laterality: N/A;   LOWER EXTREMITY ANGIOGRAPHY N/A 05/04/2018   Procedure: LOWER EXTREMITY ANGIOGRAPHY;  Surgeon: [name], MD;  Location: [facility];  Service: Cardiovascular;  Laterality: N/A;   PERIPHERAL VASCULAR INTERVENTION Bilateral 05/04/2018   Procedure: PERIPHERAL VASCULAR INTERVENTION;  Surgeon: [name], MD;  Location: [facility];  Service: Cardiovascular;  Laterality: Bilateral;  [place]   ULTRASOUND GUIDANCE FOR VASCULAR ACCESS Left 12/11/2021   Procedure: ULTRASOUND GUIDANCE FOR VASCULAR ACCESS;  Surgeon: Cherre Robins, MD;  Location: [facility];  Service: Vascular;  Laterality: Left;     Family History: Family History  Problem Relation Age of Onset   Other Mother        natural causes   Kidney disease Father    Cancer Brother        throat cancer    Social History: Social History   Socioeconomic History   Marital status:  Married    Spouse name: Ila   Number of children: 2   Years of education: Not on file   Highest education level: Not on file  Occupational History   Not on file  Tobacco Use   Smoking status: Former    Types: Cigarettes    Quit date: 12/10/1983    Years since quitting: 38.6   Smokeless tobacco: Never   Tobacco comments:    quit 1985  Vaping Use   Vaping Use: Never used  Substance and Sexual Activity   Alcohol use: No   Drug use: No   Sexual activity: Not on file  Other Topics Concern   Not on file  Social History Narrative   1 stepdaughter, Mardene Celeste. 1 son who is not around and not in contact with his father. Pt's wife has  alzheimers   Social Determinants of Radio broadcast assistant Strain: Not on file  Food Insecurity: No Food Insecurity (06/15/2022)   Hunger Vital Sign    Worried About Running Out of Food in the Last Year: Never true    Ran Out of Food in the Last Year: Never true  Transportation Needs: No Transportation Needs (06/15/2022)   PRAPARE - Hydrologist (Medical): No    Lack of Transportation (Non-Medical): No  Physical Activity: Not on file  Stress: Not on file  Social Connections: Not on file    Allergies: Allergies  Allergen Reactions   Tape Other (See Comments)    SKIN IS VERY DELICATE AND WILL TEAR EASILY!!!!   Prednisone Rash   Sulfa Antibiotics Other (See Comments)    "Hyper"    Outpatient Meds: Current Outpatient Medications  Medication Sig Dispense Refill   acetaminophen (TYLENOL) 650 MG CR tablet Take 650 mg by mouth every 8 (eight) hours as needed for pain.     aspirin EC 81 MG tablet Take 1 tablet (81 mg total) by mouth in the morning. Swallow whole. 30 tablet 11   atorvastatin (LIPITOR) 40 MG tablet TAKE 1 TABLET (40 MG TOTAL) BY MOUTH IN THE MORNING. 90 tablet 3   clopidogrel (PLAVIX) 75 MG tablet Take 1 tablet (75 mg total) by mouth in the morning. 90 tablet 0   docusate sodium (COLACE) 100 MG capsule Take 1 capsule (100 mg total) by mouth 2 (two) times daily as needed for mild constipation. 10 capsule 0   famotidine (PEPCID) 40 MG tablet Take 40 mg by mouth 2 (two) times daily.     folic acid (FOLVITE) 1 MG tablet Take 1 tablet (1 mg total) by mouth daily. 30 tablet 0   furosemide (LASIX) 20 MG tablet Take 1 tablet (20 mg total) by mouth in the morning. 90 tablet 0   hydrALAZINE (APRESOLINE) 10 MG tablet TAKE 1 TABLET (10 MG TOTAL) BY MOUTH IN THE MORNING AND AT BEDTIME 180 tablet 1   isosorbide mononitrate (IMDUR) 30 MG 24 hr tablet TAKE 1 TABLET BY MOUTH EVERY DAY 90 tablet 1   levothyroxine (SYNTHROID) 75 MCG tablet Take 3 tablets  (225 mcg total) by mouth in the morning. 90 tablet 0   metoprolol succinate (TOPROL-XL) 50 MG 24 hr tablet Take 1 tablet (50 mg total) by mouth 2 (two) times daily. Take with or immediately following a meal. 60 tablet 0   mirtazapine (REMERON) 30 MG tablet TAKE 1 TABLET BY MOUTH EVERY DAY 90 tablet 1   montelukast (SINGULAIR) 10 MG tablet TAKE 1 TABLET BY MOUTH EVERY DAY  90 tablet 1   Multiple Vitamin (MULTIVITAMIN WITH MINERALS) TABS tablet Take 1 tablet by mouth daily. 30 tablet 0   pantoprazole (PROTONIX) 40 MG tablet Take 1 tablet (40 mg total) by mouth in the morning and at bedtime. Take 40 mg by mouth in the morning and at bedtime. 180 tablet 0   REFRESH OPTIVE PF 0.5-0.9 % SOLN Place 1 drop into both eyes daily.     TUMS E-X 750 750 MG chewable tablet Chew 1-4 tablets by mouth daily.     Vaseline Petrolatum Gauze (VASELINE GAUZE) Apply topically daily. 15 each 0   WIXELA INHUB 250-50 MCG/ACT AEPB Inhale 1 puff into the lungs in the morning and at bedtime.     zinc gluconate 50 MG tablet Take 1 tablet (50 mg total) by mouth daily. 90 tablet 1   No current facility-administered medications for this visit.      ___________________________________________________________________ Objective   Exam:  There were no vitals taken for this visit. Wt Readings from Last 3 Encounters:  06/23/22 156 lb 12.8 oz (71.1 kg)  06/23/22 157 lb (71.2 kg)  06/18/22 157 lb (71.2 kg)    General: well-appearing ***  Eyes: sclera anicteric, no redness ENT: oral mucosa moist without lesions, no cervical or supraclavicular lymphadenopathy CV: ***, no JVD, no peripheral edema Resp: clear to auscultation bilaterally, normal RR and effort noted GI: soft, *** tenderness, with active bowel sounds. No guarding or palpable organomegaly noted. Skin; warm and dry, no rash or jaundice noted Neuro: awake, alert and oriented x 3. Normal gross motor function and fluent speech  Labs:    Latest Ref Rng & Units  06/23/2022   11:06 AM 06/18/2022    2:01 PM 06/13/2022    9:08 AM  CBC  WBC 4.0 - 10.5 K/uL 6.0  8.0  4.8   Hemoglobin 13.0 - 17.0 g/dL 9.1  9.3  8.6   Hematocrit 39.0 - 52.0 % 29.0  29.4  28.2   Platelets 150.0 - 400.0 K/uL 244.0  254.0  254       Latest Ref Rng & Units 06/18/2022    2:01 PM 06/13/2022    9:08 AM 06/12/2022    8:09 AM  CMP  Glucose 70 - 99 mg/dL 90  122  92   BUN 6 - 23 mg/dL '25  24  29   '$ Creatinine 0.40 - 1.50 mg/dL 1.57  1.63  1.87   Sodium 135 - 145 mEq/L 135  135  139   Potassium 3.5 - 5.1 mEq/L 5.1  4.0  5.1   Chloride 96 - 112 mEq/L 101  105  110   CO2 19 - 32 mEq/L '27  23  23   '$ Calcium 8.4 - 10.5 mg/dL 9.9  8.1  8.1      Radiologic Studies:  EXAM: 11/19/2021 CT ANGIOGRAPHY OF ABDOMINAL AORTA WITH ILIOFEMORAL RUNOFF   TECHNIQUE: Multidetector CT imaging of the abdomen, pelvis and lower extremities was performed using the standard protocol during bolus administration of intravenous contrast. Multiplanar CT image reconstructions and MIPs were obtained to evaluate the vascular anatomy.   RADIATION DOSE REDUCTION: This exam was performed according to the departmental dose-optimization program which includes automated exposure control, adjustment of the mA and/or kV according to patient size and/or use of iterative reconstruction technique.   CONTRAST:  145m OMNIPAQUE IOHEXOL 350 MG/ML SOLN   COMPARISON:  12/09/2006   FINDINGS: VASCULAR   Aorta: Mild atherosclerosis of the lower thoracic aorta.  Unremarkable diameter at the hiatus. No aneurysm of the abdominal aorta. No dissection, ulcerated plaque. No wall thickening or periaortic fluid.   Celiac: Mild atherosclerotic changes at the celiac artery origin without evidence of high-grade stenosis or occlusion. Branches are patent.   SMA: No significant atherosclerotic changes at the SMA origin. Branches are patent.   Renals:   - Right: Single right renal artery. Mild atherosclerosis  without evidence of high-grade stenosis or occlusion   - Left: Single left renal artery. Mild atherosclerosis without high-grade stenosis or occlusion   IMA: Inferior mesenteric artery is patent. Rim calcified soft tissue focus along the course of the IMA is favored to represent the remnants of a remote torsed appendage. This finding was present within the peritoneum on the remote comparison CT, within the right abdomen and now migrated.   Right lower extremity:   Patent common iliac artery stent. Moderate atherosclerotic changes of the common iliac artery and external iliac artery without evidence of high-grade stenosis or occlusion. Hypogastric artery is patent with mild atherosclerosis.   Calcified Coral reef plaque of the right common femoral artery with at least 50% narrowing.   Atherosclerotic calcifications extend into the proximal SFA and the profunda femoris.   Profunda femoris and the thigh branches are patent.   Advanced atherosclerosis of the right SFA, including disease proximally and calcified disease within the adductor canal contributing to chronic total occlusion in the adductor canal.   Reconstitution of distal SFA and popliteal artery via collateral flow.   Trifurcation is patent.   Circumferential calcifications of the tibial arteries somewhat limits evaluation. There appears to be a short segment occlusion in the anterior tibial artery. Posterior tibial artery and peroneal artery appear patent from the origin to the ankle.   Left lower extremity:   Patent left common iliac stent. Moderate atherosclerosis of the distal common iliac artery and external iliac artery without high-grade stenosis or occlusion. Hypogastric artery is patent with moderate atherosclerosis.   Common femoral artery patent with mild atherosclerosis.   Profunda femoris and thigh branches patent.   SFA demonstrates moderate atherosclerotic changes including mixed calcified  and soft plaque in the adductor canal contributing likely to a high-grade stenosis.   Popliteal artery patent.   Trifurcation is patent.   Circumferential calcifications somewhat limited evaluation of the tibial arteries. The anterior tibial artery, peroneal artery, and posterior tibial artery appear patent from the origin to the ankle.   The lateral plantar artery appears occluded   Veins: Unremarkable appearance of the venous system.   Review of the MIP images confirms the above findings.   NON-VASCULAR   Lower chest: Right greater than left pleural effusion. Associated atelectasis. Cardiac pacer is partially imaged.   Hepatobiliary: Unremarkable appearance of the liver. Cholecystectomy   Pancreas: Unremarkable.   Spleen: Unremarkable.   Adrenals/Urinary Tract:   - Right adrenal gland: Unremarkable   - Left adrenal gland: Unremarkable.   - Right kidney: No hydronephrosis, nephrolithiasis, inflammation, or ureteral dilation. Small low-density cortical lesion in the lower pole of the right kidney, too small to characterize.   - Left Kidney: Hydronephrosis and hydroureter, new from the comparison CT. No nephrolithiasis. No inflammatory changes. Bosniak 1 cyst in the lower left renal cortex measures 2.8 cm. There is a smaller nonspecific cystic structure measuring 1 cm on the lateral cortex which has grown 3 mm since the prior of 2008 and is most likely benign. The left ureter may have ectopic insertion, above the level of the right  insertion based on the CT images.   - Urinary Bladder: Urinary bladder significantly distended.   Stomach/Bowel:   - Stomach: Unremarkable.   - Small bowel: Unremarkable   - Appendix: Appendix is not visualized, however, no inflammatory changes are present adjacent to the cecum to indicate an appendicitis.   - Colon: Mild colonic diverticular disease. No inflammatory changes.   Lymphatic: No adenopathy.   Mesenteric: No free  fluid or air. No mesenteric adenopathy.   Reproductive: Prostatomegaly with a transverse dimension of the prostate 3.8 cm.   Other: No hernia.   Musculoskeletal: No displaced fracture. Degenerative changes of the visualized spine. No aggressive appearing sclerotic or lucent lesions.   IMPRESSION: Multilevel PA D, including:   -aortic atherosclerosis Aortic Atherosclerosis (ICD10-I70.0).   -bilateral iliac arterial disease, with patent bilateral CIA stent system   -advanced right femoropopliteal disease, including CFA stenosis secondary to coral reef plaque extending into the SFA and profunda femoris origin, chronic CTO of the mid/distal SFA, and reconstitution of the popliteal artery   -right tibial arterial disease with calcifications limiting the evaluation, however, apparent right AT occlusion distally   -moderate left femoropopliteal disease, with likely high-grade stenosis of the SFA within the adductor canal secondary to mixed calcified and soft plaque and patency of the popliteal artery   -left tibial arterial disease, with calcifications somewhat limiting the evaluation though apparent patency of all 3 tibial arteries to the ankle.   Mild mesenteric and renal artery disease   Significant distention of urinary bladder.   Left-sided hydronephrosis and ureteral distension, potentially related to the urinary bladder distension. Questionable ectopic left ureterovesical junction. Referral to Urology may be useful.   Right greater than left pleural effusion and associated atelectasis.    Assessment: No diagnosis found.  ***   Plan: ***   Thank you for the courtesy of this consult.  Please call me with any questions or concerns.  Rutherford Limerick  CC: Referring provider noted above   I,Safa M Kadhim,acting as a scribe for Nelida Meuse III, MD.,have documented all relevant documentation on the behalf of Doran Stabler, MD,as directed by  Doran Stabler, MD while in the presence of Doran Stabler, MD.

## 2022-08-03 ENCOUNTER — Encounter: Payer: Self-pay | Admitting: Gastroenterology

## 2022-08-03 ENCOUNTER — Ambulatory Visit: Payer: Medicare Other | Admitting: Gastroenterology

## 2022-08-03 VITALS — BP 122/82 | HR 62 | Ht 67.0 in | Wt 160.0 lb

## 2022-08-03 DIAGNOSIS — R142 Eructation: Secondary | ICD-10-CM | POA: Diagnosis not present

## 2022-08-03 DIAGNOSIS — R1319 Other dysphagia: Secondary | ICD-10-CM | POA: Diagnosis not present

## 2022-08-03 DIAGNOSIS — R159 Full incontinence of feces: Secondary | ICD-10-CM | POA: Diagnosis not present

## 2022-08-03 DIAGNOSIS — R152 Fecal urgency: Secondary | ICD-10-CM

## 2022-08-03 DIAGNOSIS — R6881 Early satiety: Secondary | ICD-10-CM | POA: Diagnosis not present

## 2022-08-03 NOTE — Patient Instructions (Signed)
You have been scheduled for an Upper GI Series at [facility] . Your appointment is on 08/10/2022 at 11:00AM. Please arrive 30 minutes prior to your test for registration. Make sure not to eat or drink anything after midnight on the night before your test. If you need to reschedule, please call radiology at [phone]. ________________________________________________________________ An upper GI series uses x rays to help diagnose problems of the upper GI tract, which includes the esophagus, stomach, and duodenum. The duodenum is the first part of the small intestine. An upper GI series is conducted by a radiology technologist or a radiologist--a doctor who specializes in x-ray imaging--at a hospital or outpatient center. While sitting or standing in front of an x-ray machine, the patient drinks barium liquid, which is often white and has a chalky consistency and taste. The barium liquid coats the lining of the upper GI tract and makes signs of disease show up more clearly on x rays. X-ray video, called fluoroscopy, is used to view the barium liquid moving through the esophagus, stomach, and duodenum. Additional x rays and fluoroscopy are performed while the patient lies on an x-ray table. To fully coat the upper GI tract with barium liquid, the technologist or radiologist may press on the abdomen or ask the patient to change position. Patients hold still in various positions, allowing the technologist or radiologist to take x rays of the upper GI tract at different angles. If a technologist conducts the upper GI series, a radiologist will later examine the images to look for problems.  This test typically takes about 1 hour to complete. __________________________________________________________________    _______________________________________________________  If your blood pressure at your visit was 140/90 or greater, please contact your primary care physician to follow up on  this.  _______________________________________________________  If you are age 10 or older, your body mass index should be between 23-30. Your Body mass index is 25.06 kg/m. If this is out of the aforementioned range listed, please consider follow up with your Primary Care Provider.  If you are age 59 or younger, your body mass index should be between 19-25. Your Body mass index is 25.06 kg/m. If this is out of the aformentioned range listed, please consider follow up with your Primary Care Provider.   ________________________________________________________  The [facility] providers would like to encourage you to use [facility] to communicate with providers for non-urgent requests or questions.  Due to long hold times on the telephone, sending your provider a message by [facility] may be a faster and more efficient way to get a response.  Please allow 48 business hours for a response.  Please remember that this is for non-urgent requests.  _______________________________________________________ It was a pleasure to see you today!  Thank you for trusting me with your gastrointestinal care!

## 2022-08-09 ENCOUNTER — Other Ambulatory Visit: Payer: Self-pay | Admitting: Internal Medicine

## 2022-08-09 ENCOUNTER — Other Ambulatory Visit: Payer: Self-pay | Admitting: Adult Health

## 2022-08-10 ENCOUNTER — Inpatient Hospital Stay (HOSPITAL_COMMUNITY): Admission: RE | Admit: 2022-08-10 | Payer: Medicare Other | Source: Ambulatory Visit

## 2022-08-10 DIAGNOSIS — L570 Actinic keratosis: Secondary | ICD-10-CM | POA: Diagnosis not present

## 2022-08-21 ENCOUNTER — Ambulatory Visit (INDEPENDENT_AMBULATORY_CARE_PROVIDER_SITE_OTHER): Payer: Medicare Other

## 2022-08-21 DIAGNOSIS — I255 Ischemic cardiomyopathy: Secondary | ICD-10-CM

## 2022-08-21 LAB — CUP PACEART REMOTE DEVICE CHECK
Battery Remaining Longevity: 126 mo
Battery Remaining Percentage: 100 %
Brady Statistic RA Percent Paced: 98 %
Brady Statistic RV Percent Paced: 14 %
Date Time Interrogation Session: 20240322024100
HighPow Impedance: 43 Ohm
Implantable Lead Connection Status: 753985
Implantable Lead Connection Status: 753985
Implantable Lead Connection Status: 753985
Implantable Lead Connection Status: 753985
Implantable Lead Implant Date: 20020206
Implantable Lead Implant Date: 20071005
Implantable Lead Implant Date: 20220624
Implantable Lead Implant Date: 20220624
Implantable Lead Location: 753858
Implantable Lead Location: 753859
Implantable Lead Location: 753860
Implantable Lead Location: 753860
Implantable Lead Model: 148
Implantable Lead Model: 4088
Implantable Lead Model: 4677
Implantable Lead Model: 7841
Implantable Lead Serial Number: 1073796
Implantable Lead Serial Number: 109512
Implantable Lead Serial Number: 224392
Implantable Lead Serial Number: 824442
Implantable Pulse Generator Implant Date: 20220624
Lead Channel Impedance Value: 465 Ohm
Lead Channel Impedance Value: 539 Ohm
Lead Channel Impedance Value: 675 Ohm
Lead Channel Pacing Threshold Amplitude: 0.6 V
Lead Channel Pacing Threshold Pulse Width: 0.4 ms
Lead Channel Setting Pacing Amplitude: 2 V
Lead Channel Setting Pacing Amplitude: 2.5 V
Lead Channel Setting Pacing Amplitude: 3.5 V
Lead Channel Setting Pacing Pulse Width: 0.4 ms
Lead Channel Setting Pacing Pulse Width: 1.2 ms
Lead Channel Setting Sensing Sensitivity: 0.5 mV
Lead Channel Setting Sensing Sensitivity: 1 mV
Pulse Gen Serial Number: 385552

## 2022-08-25 ENCOUNTER — Telehealth: Payer: Self-pay

## 2022-08-25 DIAGNOSIS — I5022 Chronic systolic (congestive) heart failure: Secondary | ICD-10-CM

## 2022-08-25 DIAGNOSIS — I472 Ventricular tachycardia, unspecified: Secondary | ICD-10-CM

## 2022-08-25 MED ORDER — METOPROLOL SUCCINATE ER 50 MG PO TB24: 50.0000 mg | ORAL_TABLET | Freq: Two times a day (BID) | ORAL | 5 refills | Status: DC

## 2022-08-25 NOTE — Telephone Encounter (Signed)
Called [name] back to make her aware of the medication refill that [facility] [place].    Toprol XL 50 mg BID, is what the Pt was taking, not a higher amount per my prior note.  This was confirmed with Pt daughter [name].    I was unable to speak with Pt daughter [name], but did speak with her mother, Ms. Rolin, making them aware that the Toprol XL script was sent in, and should be ready for pick up tonight.  Ms. Ouellette stated she would make her daughter aware of this.

## 2022-08-25 NOTE — Telephone Encounter (Signed)
Pt requesting refill for Metoprolol.  Someone denied it stating it had been taken care of, but it hasn't.  However, the dose that was on Anige Duke, PA-C's viist in December is different, and I can't find where it was changed.  Pt was on 200 mg Metoprolol taking 1/2 tablet bid.  Can you verify what dose pt is to be on, as pt is out of medication and needs a refill.

## 2022-08-25 NOTE — Telephone Encounter (Signed)
Call received from Pt daughter, regarding how much Toprol XL the Pt is currently taking.    After speaking to Pt daughter, They are have been quartering the Toprol XL tablet, and only giving him 50 mg in the morning and 50 mg at night... as ordered and listed on Pt MAR.   Dr. [name] is not available today to obtain a refill order;  I will reach out to Dr Percival Spanish to verify that refilling the ER Toprol XL order from Dr. Posey Pronto is ok.  Follow up required.

## 2022-08-25 NOTE — Telephone Encounter (Signed)
   Pt's daughter Mardene Celeste calling back. She would like to speak with RN Merrilee Seashore regarding pt's Metoprolol

## 2022-08-25 NOTE — Telephone Encounter (Signed)
*  STAT* If patient is at the pharmacy, call can be transferred to refill team.   1. Which medications need to be refilled? (please list name of each medication and dose if known)  new prescription for Metoprolol  2. Which pharmacy/location (including street and city if local pharmacy) is medication to be sent to? [facility] [place]  3. Do they need a 30 day or 90 day supply? 90 days and refills- please call in today- completely out

## 2022-08-25 NOTE — Telephone Encounter (Signed)
Message received for refill of medication Toprol XL 200 mg; Per refill team.    Per ER documentation / Dr. [name] clinic visit on 06/23/2022, pt should be taking Toprol XL 50 mg, 1 tablet 50 mg, PO BID.    Per message from refill team Pt taking: 200 mg Metoprolol taking 1/2 tablet bid.   Tried to contact Pt, left a voicemail message for Pt to call me back.    Contacted Pt spouse, [name], attempted to explain call, and Pt spouse hung up on me.  I will wait for Pt to call back to investigate this further.    I will also forward this request to Dr. [name].

## 2022-09-12 ENCOUNTER — Other Ambulatory Visit: Payer: Self-pay | Admitting: Internal Medicine

## 2022-09-12 DIAGNOSIS — E038 Other specified hypothyroidism: Secondary | ICD-10-CM

## 2022-09-15 DIAGNOSIS — Z9622 Myringotomy tube(s) status: Secondary | ICD-10-CM | POA: Diagnosis not present

## 2022-09-15 DIAGNOSIS — H9202 Otalgia, left ear: Secondary | ICD-10-CM | POA: Diagnosis not present

## 2022-09-22 ENCOUNTER — Encounter: Payer: Self-pay | Admitting: Internal Medicine

## 2022-09-22 ENCOUNTER — Ambulatory Visit (INDEPENDENT_AMBULATORY_CARE_PROVIDER_SITE_OTHER): Payer: Medicare Other | Admitting: Internal Medicine

## 2022-09-22 VITALS — BP 136/64 | HR 70 | Temp 98.1°F | Ht 67.0 in | Wt 158.0 lb

## 2022-09-22 DIAGNOSIS — E785 Hyperlipidemia, unspecified: Secondary | ICD-10-CM | POA: Insufficient documentation

## 2022-09-22 DIAGNOSIS — D538 Other specified nutritional anemias: Secondary | ICD-10-CM

## 2022-09-22 DIAGNOSIS — D520 Dietary folate deficiency anemia: Secondary | ICD-10-CM

## 2022-09-22 DIAGNOSIS — L602 Onychogryphosis: Secondary | ICD-10-CM | POA: Diagnosis not present

## 2022-09-22 DIAGNOSIS — E039 Hypothyroidism, unspecified: Secondary | ICD-10-CM | POA: Diagnosis not present

## 2022-09-22 DIAGNOSIS — N1832 Chronic kidney disease, stage 3b: Secondary | ICD-10-CM

## 2022-09-22 LAB — CBC WITH DIFFERENTIAL/PLATELET
Basophils Absolute: 0.1 10*3/uL (ref 0.0–0.1)
Basophils Relative: 2 % (ref 0.0–3.0)
Eosinophils Absolute: 0.3 10*3/uL (ref 0.0–0.7)
Eosinophils Relative: 5 % (ref 0.0–5.0)
HCT: 27.9 % — ABNORMAL LOW (ref 39.0–52.0)
Hemoglobin: 9 g/dL — ABNORMAL LOW (ref 13.0–17.0)
Lymphocytes Relative: 37.3 % (ref 12.0–46.0)
Lymphs Abs: 2.1 10*3/uL (ref 0.7–4.0)
MCHC: 32.2 g/dL (ref 30.0–36.0)
MCV: 94.4 fl (ref 78.0–100.0)
Monocytes Absolute: 0.3 10*3/uL (ref 0.1–1.0)
Monocytes Relative: 6.1 % (ref 3.0–12.0)
Neutro Abs: 2.9 10*3/uL (ref 1.4–7.7)
Neutrophils Relative %: 49.6 % (ref 43.0–77.0)
Platelets: 217 10*3/uL (ref 150.0–400.0)
RBC: 2.96 Mil/uL — ABNORMAL LOW (ref 4.22–5.81)
RDW: 22.3 % — ABNORMAL HIGH (ref 11.5–15.5)
WBC: 5.7 10*3/uL (ref 4.0–10.5)

## 2022-09-22 LAB — BASIC METABOLIC PANEL
BUN: 30 mg/dL — ABNORMAL HIGH (ref 6–23)
CO2: 28 mEq/L (ref 19–32)
Calcium: 9.7 mg/dL (ref 8.4–10.5)
Chloride: 103 mEq/L (ref 96–112)
Creatinine, Ser: 1.89 mg/dL — ABNORMAL HIGH (ref 0.40–1.50)
GFR: 31.69 mL/min — ABNORMAL LOW (ref >60.00)
Glucose, Bld: 92 mg/dL (ref 70–99)
Potassium: 4.7 mEq/L (ref 3.5–5.1)
Sodium: 137 mEq/L (ref 135–145)

## 2022-09-22 LAB — LIPID PANEL
Cholesterol: 113 mg/dL (ref 0–200)
HDL: 39.9 mg/dL (ref >39.00)
LDL Cholesterol: 57 mg/dL (ref 0–99)
NonHDL: 73.31
Total CHOL/HDL Ratio: 3
Triglycerides: 83 mg/dL (ref 0.0–149.0)
VLDL: 16.6 mg/dL (ref 0.0–40.0)

## 2022-09-22 LAB — TSH: TSH: 58.02 u[IU]/mL — ABNORMAL HIGH (ref 0.35–5.50)

## 2022-09-22 MED ORDER — UNITHROID 125 MCG PO TABS
125.0000 ug | ORAL_TABLET | Freq: Every day | ORAL | 0 refills | Status: DC
Start: 2022-09-22 — End: 2022-10-20

## 2022-09-22 MED ORDER — ZINC GLUCONATE 50 MG PO TABS
50.0000 mg | ORAL_TABLET | Freq: Every day | ORAL | 1 refills | Status: DC
Start: 2022-09-22 — End: 2023-04-27

## 2022-09-22 MED ORDER — FOLIC ACID 1 MG PO TABS: 1.0000 mg | ORAL_TABLET | Freq: Every day | ORAL | 0 refills | Status: DC

## 2022-09-22 NOTE — Progress Notes (Signed)
Remote ICD transmission.   

## 2022-09-22 NOTE — Patient Instructions (Signed)

## 2022-09-22 NOTE — Progress Notes (Signed)
Subjective:  Patient ID: [name], male    DOB: 1934-04-10  Age: 89 y.o. MRN: [id number]  CC: Anemia, Hypothyroidism, and Hyperlipidemia   HPI [place] Toure presents for f/up ---  He continues to complain of indigestion but tells me his symptoms are well-controlled with Pepcid and Tums.  He denies odynophagia, dysphagia, chest pain, or weight loss.  He continues to complain of fatigue and lower extremity edema.  His family member today tells me he is taking the T4 supplement.  Outpatient Medications Prior to Visit  Medication Sig Dispense Refill   acetaminophen (TYLENOL) 650 MG CR tablet Take 650 mg by mouth every 8 (eight) hours as needed for pain.     aspirin EC 81 MG tablet Take 1 tablet (81 mg total) by mouth in the morning. Swallow whole. 30 tablet 11   atorvastatin (LIPITOR) 40 MG tablet TAKE 1 TABLET (40 MG TOTAL) BY MOUTH IN THE MORNING. 90 tablet 3   clopidogrel (PLAVIX) 75 MG tablet Take 1 tablet (75 mg total) by mouth in the morning. 90 tablet 0   docusate sodium (COLACE) 100 MG capsule Take 1 capsule (100 mg total) by mouth 2 (two) times daily as needed for mild constipation. 10 capsule 0   famotidine (PEPCID) 40 MG tablet Take 40 mg by mouth 2 (two) times daily.     furosemide (LASIX) 20 MG tablet Take 1 tablet (20 mg total) by mouth in the morning. 90 tablet 0   hydrALAZINE (APRESOLINE) 10 MG tablet TAKE 1 TABLET (10 MG TOTAL) BY MOUTH IN THE MORNING AND AT BEDTIME 180 tablet 1   isosorbide mononitrate (IMDUR) 30 MG 24 hr tablet TAKE 1 TABLET BY MOUTH EVERY DAY 90 tablet 1   metoprolol succinate (TOPROL-XL) 50 MG 24 hr tablet Take 1 tablet (50 mg total) by mouth 2 (two) times daily. Take with or immediately following a meal. 60 tablet 5   mirtazapine (REMERON) 30 MG tablet TAKE 1 TABLET BY MOUTH EVERY DAY 90 tablet 1   montelukast (SINGULAIR) 10 MG tablet TAKE 1 TABLET BY MOUTH EVERY DAY 90 tablet 1   Multiple Vitamin (MULTIVITAMIN WITH MINERALS) TABS tablet Take 1  tablet by mouth daily. 30 tablet 0   pantoprazole (PROTONIX) 40 MG tablet TAKE 1 TABLET (40 MG TOTAL) BY MOUTH IN THE MORNING AND AT BEDTIME. 180 tablet 0   REFRESH OPTIVE PF 0.5-0.9 % SOLN Place 1 drop into both eyes daily.     TUMS E-X 750 750 MG chewable tablet Chew 1-4 tablets by mouth daily.     Vaseline Petrolatum Gauze (VASELINE GAUZE) Apply topically daily. 15 each 0   WIXELA INHUB 250-50 MCG/ACT AEPB Inhale 1 puff into the lungs in the morning and at bedtime.     folic acid (FOLVITE) 1 MG tablet Take 1 tablet (1 mg total) by mouth daily. 30 tablet 0   levothyroxine (SYNTHROID) 75 MCG tablet Take 3 tablets (225 mcg total) by mouth in the morning. 90 tablet 0   zinc gluconate 50 MG tablet Take 1 tablet (50 mg total) by mouth daily. 90 tablet 1   No facility-administered medications prior to visit.    ROS Review of Systems  Constitutional:  Positive for fatigue. Negative for diaphoresis and unexpected weight change.  Respiratory:  Negative for chest tightness, shortness of breath and wheezing.   Cardiovascular:  Positive for leg swelling. Negative for chest pain and palpitations.  Gastrointestinal: Negative.  Negative for abdominal distention, constipation, diarrhea, nausea  and vomiting.  Genitourinary: Negative.  Negative for difficulty urinating.  Musculoskeletal: Negative.   Skin:  Positive for pallor.  Neurological: Negative.  Negative for dizziness and weakness.  Hematological:  Negative for adenopathy. Does not bruise/bleed easily.  Psychiatric/Behavioral: Negative.      Objective:  BP 136/64 (BP Location: Right Arm, Patient Position: Sitting, Cuff Size: Large)   Pulse 70   Temp 98.1 F (36.7 C) (Oral)   Ht 5\' 7"  (1.702 m)   Wt 158 lb (71.7 kg)   SpO2 90%   BMI 24.75 kg/m   BP Readings from Last 3 Encounters:  09/22/22 136/64  08/03/22 122/82  06/23/22 (!) 134/48    Wt Readings from Last 3 Encounters:  09/22/22 158 lb (71.7 kg)  08/03/22 160 lb (72.6 kg)   06/23/22 156 lb 12.8 oz (71.1 kg)    Physical Exam Constitutional:      General: He is not in acute distress.    Appearance: He is ill-appearing. He is not toxic-appearing or diaphoretic.  HENT:     Nose: Nose normal.     Mouth/Throat:     Mouth: Mucous membranes are moist.  Eyes:     General: No scleral icterus.    Conjunctiva/sclera: Conjunctivae normal.  Cardiovascular:     Rate and Rhythm: Normal rate and regular rhythm.     Pulses:          Dorsalis pedis pulses are 0 on the right side and 0 on the left side.       Posterior tibial pulses are 0 on the right side and 0 on the left side.     Heart sounds: No murmur heard.    No gallop.  Pulmonary:     Effort: Pulmonary effort is normal.     Breath sounds: Examination of the right-upper field reveals decreased breath sounds. Examination of the left-upper field reveals decreased breath sounds. Examination of the right-middle field reveals decreased breath sounds. Examination of the left-middle field reveals decreased breath sounds. Examination of the right-lower field reveals decreased breath sounds. Examination of the left-lower field reveals decreased breath sounds. Decreased breath sounds present. No wheezing, rhonchi or rales.  Abdominal:     General: Bowel sounds are decreased. There is no distension.     Palpations: Abdomen is soft. There is no hepatomegaly, splenomegaly or mass.     Tenderness: There is no abdominal tenderness.  Musculoskeletal:     Cervical back: Neck supple.     Right lower leg: 3+ Edema present.     Left lower leg: 3+ Edema present.  Feet:     Right foot:     Skin integrity: Skin integrity normal.     Toenail Condition: Right toenails are abnormally thick and long. Fungal disease present.    Left foot:     Skin integrity: Skin integrity normal.     Toenail Condition: Left toenails are abnormally thick and long. Fungal disease present. Lymphadenopathy:     Cervical: No cervical adenopathy.   Skin:    General: Skin is warm and dry.  Neurological:     General: No focal deficit present.     Mental Status: He is alert. Mental status is at baseline.  Psychiatric:        Mood and Affect: Mood normal.        Behavior: Behavior normal.     Lab Results  Component Value Date   WBC 5.7 09/22/2022   HGB 9.0 (L) 09/22/2022   HCT 27.9 (  L) 09/22/2022   PLT 217.0 09/22/2022   GLUCOSE 92 09/22/2022   CHOL 113 09/22/2022   TRIG 83.0 09/22/2022   HDL 39.90 09/22/2022   LDLCALC 57 09/22/2022   ALT 8 06/11/2022   AST 19 06/11/2022   NA 137 09/22/2022   K 4.7 09/22/2022   CL 103 09/22/2022   CREATININE 1.89 (H) 09/22/2022   BUN 30 (H) 09/22/2022   CO2 28 09/22/2022   TSH 58.02 (H) 09/22/2022   INR 1.3 (H) 06/11/2022    No results found.  Assessment & Plan:   Anemia due to zinc deficiency -     CBC with Differential/Platelet; Future -     Zinc Gluconate; Take 1 tablet (50 mg total) by mouth daily.  Dispense: 90 tablet; Refill: 1  Acquired hypothyroidism- His TSH is 58.  Will increase the T4 dosage. -     TSH; Future -     Unithroid; Take 1 tablet (125 mcg total) by mouth daily before breakfast.  Dispense: 90 tablet; Refill: 0  Stage 3b chronic kidney disease (HCC)- His renal function is stable. -     Basic metabolic panel; Future  Dyslipidemia, goal LDL below 100 - LDL goal achieved. Doing well on the statin  -     Lipid panel; Future  Long toenail -     Ambulatory referral to Podiatry  Dietary folate deficiency anemia -     Folic Acid; Take 1 tablet (1 mg total) by mouth daily.  Dispense: 90 tablet; Refill: 0     Follow-up: Return in about 6 months (around 03/24/2023).  Sanda Linger, MD

## 2022-10-01 DIAGNOSIS — Z9622 Myringotomy tube(s) status: Secondary | ICD-10-CM | POA: Diagnosis not present

## 2022-10-04 IMAGING — CR DG CHEST 2V
2 series · 2 of 2 positions shown · non-contrast
Comparison: 05/21/2015

CLINICAL DATA: ICD placement

EXAM:
CHEST - 2 VIEW

[chest lat]
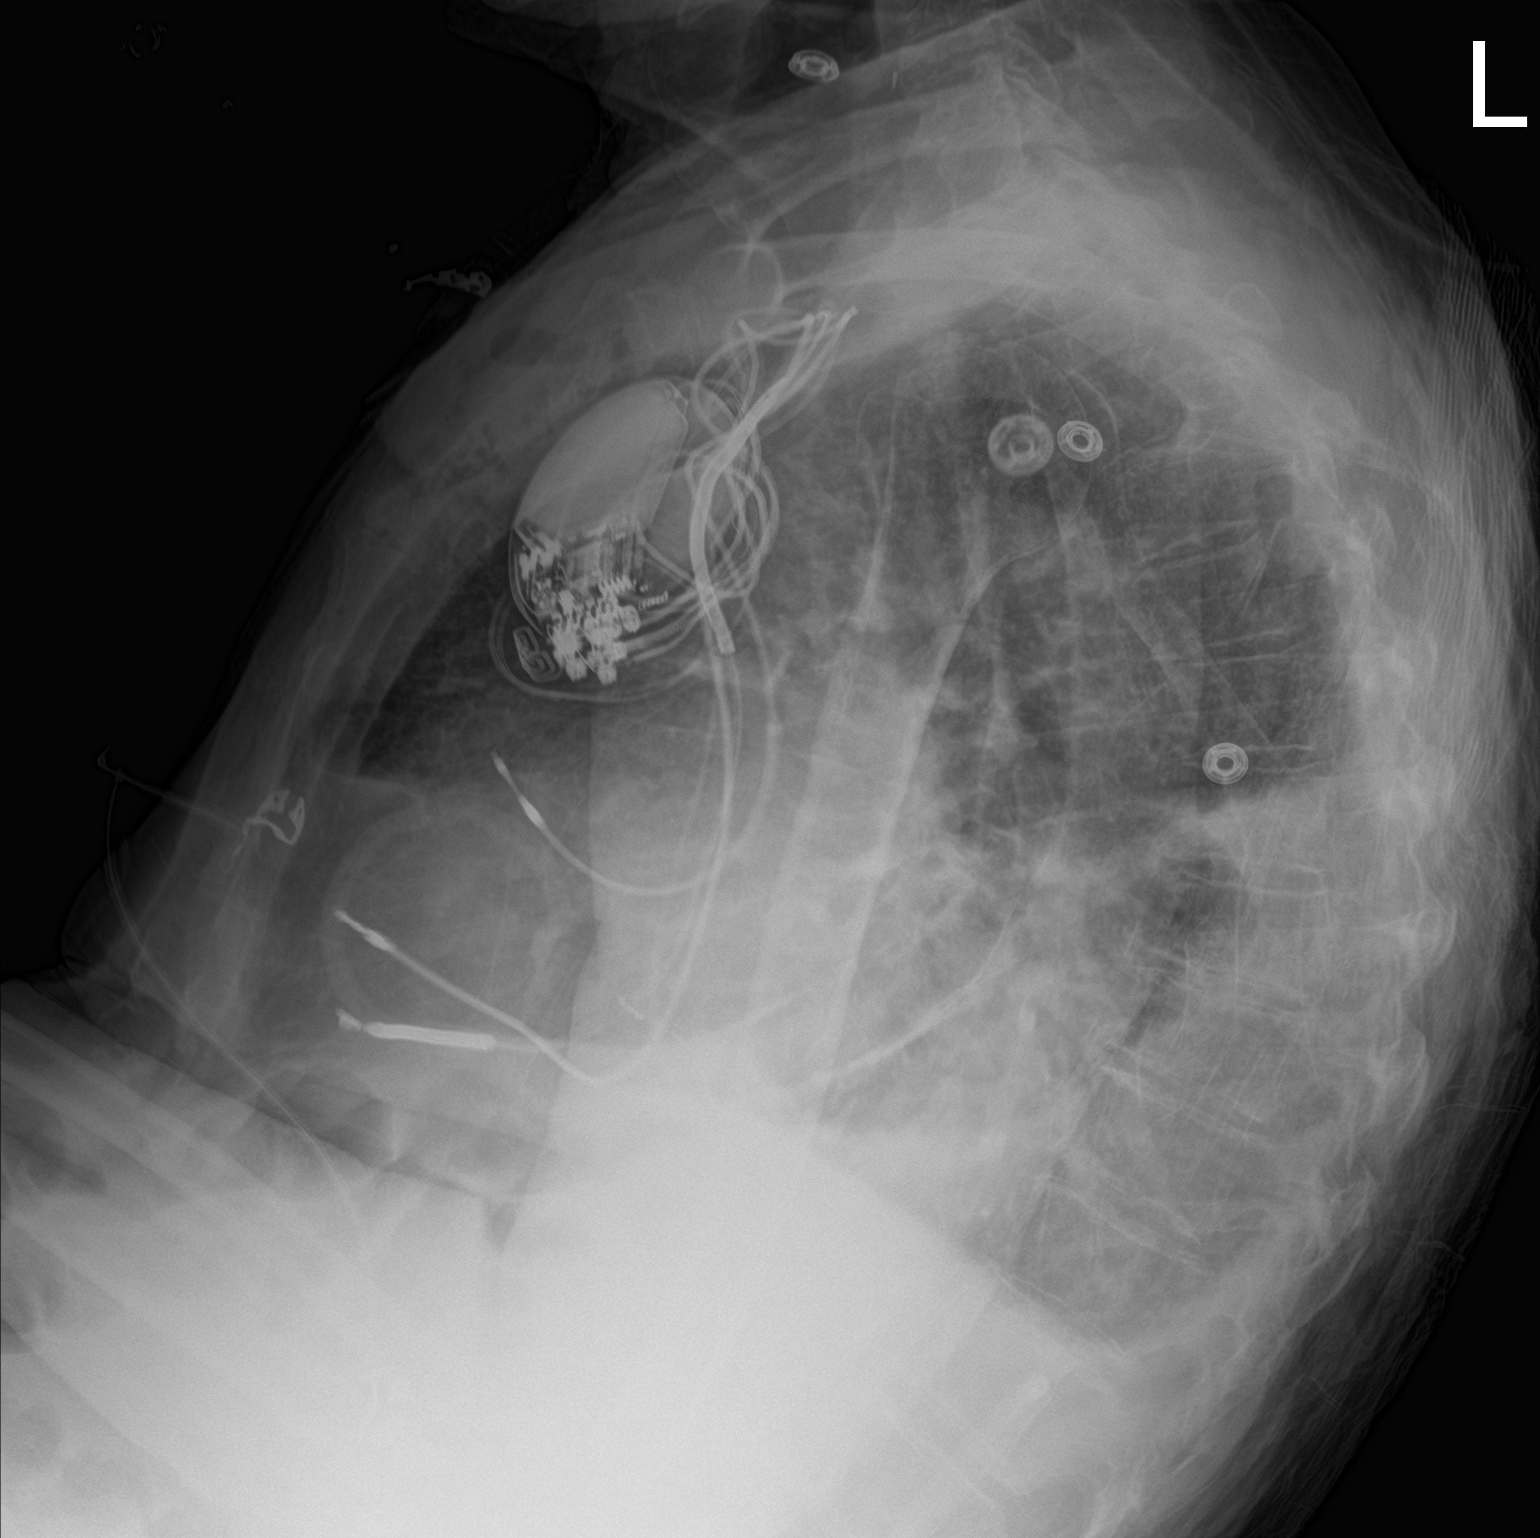

[chest ap]
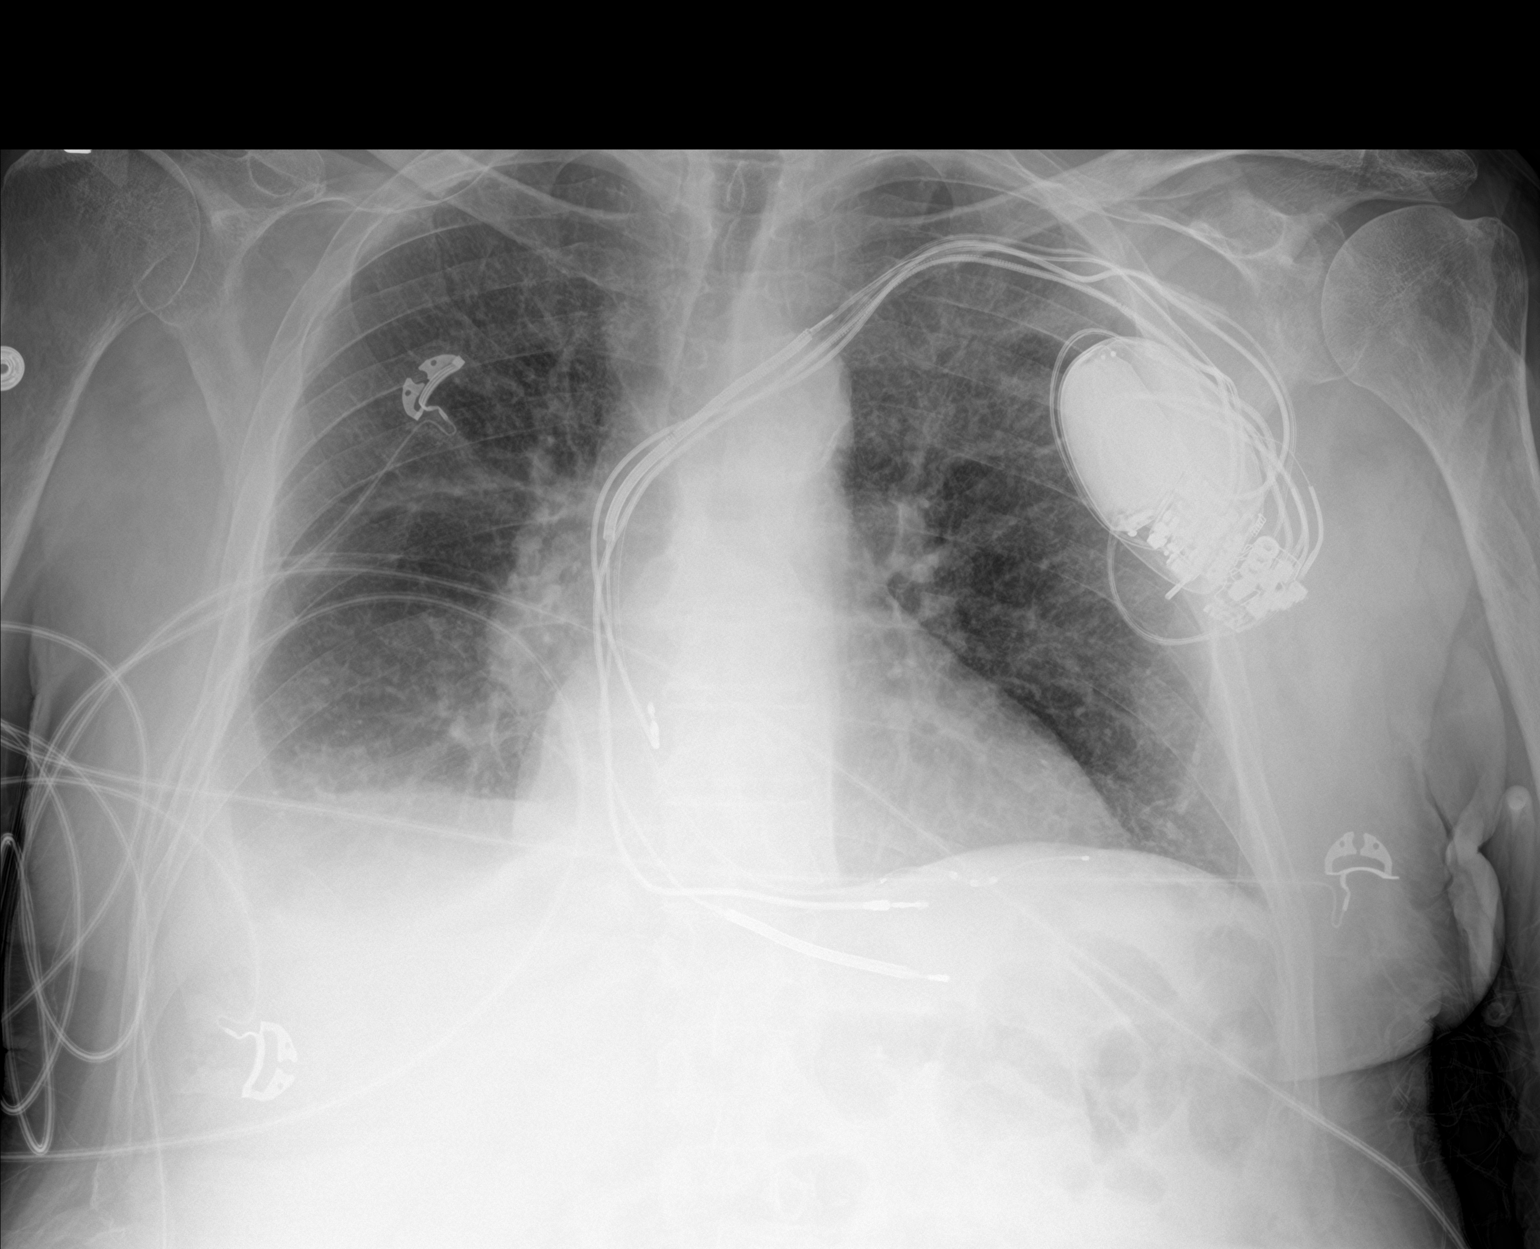

[2 of 2 positions shown; findings below may reference images not displayed]

FINDINGS: Left subclavian pacemaker defibrillator has been modified with
multiple leads seen within the right atrium, right ventricle, and
expected left ventricular venous outflow. Mild cardiogenic failure
is present with mild interstitial pulmonary edema and small to
moderate bilateral pleural effusions, right greater than left.
Cardiac size within normal limits. No pneumothorax. The central
pulmonary arteries are enlarged in keeping with changes of pulmonary
arterial hypertension.
IMPRESSION: Interval left subclavian pacemaker defibrillator revision. No
pneumothorax.

Mild cardiogenic failure with bilateral pleural effusions, right
greater than left.

Stable enlargement of the central pulmonary arteries.

## 2022-10-07 ENCOUNTER — Ambulatory Visit: Payer: Medicare Other | Admitting: Podiatry

## 2022-10-07 DIAGNOSIS — B351 Tinea unguium: Secondary | ICD-10-CM | POA: Diagnosis not present

## 2022-10-07 DIAGNOSIS — M79674 Pain in right toe(s): Secondary | ICD-10-CM

## 2022-10-07 DIAGNOSIS — M79675 Pain in left toe(s): Secondary | ICD-10-CM | POA: Diagnosis not present

## 2022-10-07 DIAGNOSIS — L84 Corns and callosities: Secondary | ICD-10-CM

## 2022-10-07 DIAGNOSIS — I739 Peripheral vascular disease, unspecified: Secondary | ICD-10-CM | POA: Diagnosis not present

## 2022-10-07 NOTE — Progress Notes (Signed)
       Subjective:  Patient ID: [name], male    DOB: 26-Sep-1933,  MRN: [id number]  [name] presents with his daughter to clinic today for:  Chief Complaint  Patient presents with   Nail Problem    Routine Foot Care   PCP: Sanda Linger MD Last visit with PCP: January 2024   Callouses    Callus trim bilateral feet lateral aspect near 5th toe.   . Patient notes nails are thick and elongated, causing pain in shoe gear when ambulating.  Patient's daughter notes that he also has painful calluses near the fifth toe on the ball of the foot bilateral.  Denies any opening or drainage.  She does note that if he experiences a cut or scrape from an injury it often bleeds easily and profusely and has resulted in hospital ER visits to be able to clot the blood.  PCP is Etta Grandchild, MD.  Last seen 09/22/22 is just this holding is killing and will be unchanged history of so Allergies  Allergen Reactions   Tape Other (See Comments)    SKIN IS VERY DELICATE AND WILL TEAR EASILY!!!!   Prednisone Rash   Sulfa Antibiotics Other (See Comments)    "Hyper"    ounds good Objective:  There were no vitals filed for this visit.  [name] is a pleasant 89 y.o. male in NAD. AAO x 3.  Vascular Examination: Patient has palpable DP pulse, absent PT pulse bilateral.  Delayed capillary refill bilateral toes.  Sparse digital hair bilateral.  Proximal to distal cooling WNL bilateral.    Dermatological Examination: Interspaces are clear with no open lesions noted bilateral.  Nails are 3-32mm thick, with yellowish/brown discoloration, subungual debris and distal onycholysis x10.  There is pain with compression of nails x10.  There are hyperkeratotic lesions noted submet 5 bilateral with pain on palpation.  No surrounding erythema and no ulceration noted..  Neurological Examination: Protective sensation intact with Semmes-Weinstein 10 gram monofilament b/l LE. Vibratory sensation intact b/l  LE.  Musculoskeletal Examination: Muscle strength 5/5 to all LE muscle groups b/l.   Patient qualifies for at-risk foot care because of PVD, pain and nails (Q8).  Assessment/Plan: 1. Dermatophytosis of nail   2. Corns   3. PVD (peripheral vascular disease) (HCC)   4. Pain in toes of both feet     Discussed clinical findings with the patient and his daughter today.  All questions were answered.  The mycotic toenails x 10 were debrided with sterile nail nippers and a power debriding bur.  The hyperkeratotic lesions were shaved with a sterile #312 blade uneventfully.  Recommend that the patient get on a regular 61-month schedule with [place] for at risk footcare due to peripheral vascular disease and being on anticoagulant therapy.  He is at risk for infection or possible amputation if he cuts himself trying to trim his own nails at home.   Return in about 3 months (around 01/07/2023) for RFC.   Clerance Lav, DPM, FACFAS [facility]     [address] [place], [place] [address]                Office [phone]  Fax [phone]

## 2022-10-17 ENCOUNTER — Other Ambulatory Visit: Payer: Self-pay | Admitting: Family Medicine

## 2022-10-17 DIAGNOSIS — E038 Other specified hypothyroidism: Secondary | ICD-10-CM

## 2022-10-19 ENCOUNTER — Other Ambulatory Visit: Payer: Self-pay | Admitting: Family Medicine

## 2022-10-19 ENCOUNTER — Telehealth: Payer: Self-pay | Admitting: Internal Medicine

## 2022-10-19 DIAGNOSIS — E038 Other specified hypothyroidism: Secondary | ICD-10-CM

## 2022-10-19 DIAGNOSIS — C44222 Squamous cell carcinoma of skin of right ear and external auricular canal: Secondary | ICD-10-CM | POA: Diagnosis not present

## 2022-10-19 DIAGNOSIS — L570 Actinic keratosis: Secondary | ICD-10-CM | POA: Diagnosis not present

## 2022-10-19 NOTE — Telephone Encounter (Signed)
Patient's daughter called and said he was prescribed Synthroid by Hetty Blend when he saw her in January. They were trying to get it refilled, but it is no longer on the current medication list. Can this get refilled? His last OV with Dr. Yetta Barre was 09/22/2022. Best callback is [phone].

## 2022-10-19 NOTE — Telephone Encounter (Signed)
Patient was her in April.  Needs refill

## 2022-10-20 ENCOUNTER — Other Ambulatory Visit: Payer: Self-pay | Admitting: Internal Medicine

## 2022-10-20 DIAGNOSIS — E039 Hypothyroidism, unspecified: Secondary | ICD-10-CM

## 2022-10-20 MED ORDER — LEVOTHYROXINE SODIUM 125 MCG PO TABS: 125.0000 ug | ORAL_TABLET | Freq: Every day | ORAL | 0 refills | Status: DC

## 2022-10-23 ENCOUNTER — Telehealth: Payer: Self-pay

## 2022-10-23 NOTE — Patient Outreach (Signed)
  Care Coordination   10/23/2022 Name: [name] MRN: [id number] DOB: January 19, 1934   Care Coordination Outreach Attempts:  An unsuccessful telephone outreach was attempted today to offer the patient information about available care coordination services.  Follow Up Plan:  Additional outreach attempts will be made to offer the patient care coordination information and services.   Encounter Outcome:  No Answer   Care Coordination Interventions:  No, not indicated    Rowe Pavy, RN, BSN, [facility] [phone]

## 2022-10-24 ENCOUNTER — Other Ambulatory Visit: Payer: Self-pay | Admitting: Internal Medicine

## 2022-11-06 ENCOUNTER — Telehealth: Payer: Self-pay

## 2022-11-06 NOTE — Telephone Encounter (Signed)
Pt daughter has called and asked that Dr. Yetta Barre please order pts labs so he can them done at [facility] in [place] on Monday.

## 2022-11-09 DIAGNOSIS — I739 Peripheral vascular disease, unspecified: Secondary | ICD-10-CM | POA: Diagnosis not present

## 2022-11-10 LAB — BUN+CREAT
BUN/Creatinine Ratio: 20 (ref 10–24)
BUN: 36 mg/dL — ABNORMAL HIGH (ref 8–27)
Creatinine, Ser: 1.78 mg/dL — ABNORMAL HIGH (ref 0.76–1.27)
eGFR: 37 mL/min/{1.73_m2} — ABNORMAL LOW (ref >59)

## 2022-11-20 ENCOUNTER — Ambulatory Visit: Payer: Medicare Other

## 2022-11-20 DIAGNOSIS — I472 Ventricular tachycardia, unspecified: Secondary | ICD-10-CM

## 2022-11-20 DIAGNOSIS — I5022 Chronic systolic (congestive) heart failure: Secondary | ICD-10-CM

## 2022-11-20 LAB — CUP PACEART REMOTE DEVICE CHECK
Battery Remaining Longevity: 120 mo
Battery Remaining Percentage: 100 %
Brady Statistic RA Percent Paced: 93 %
Brady Statistic RV Percent Paced: 35 %
Date Time Interrogation Session: 20240621024100
HighPow Impedance: 41 Ohm
Implantable Lead Connection Status: 753985
Implantable Lead Connection Status: 753985
Implantable Lead Connection Status: 753985
Implantable Lead Connection Status: 753985
Implantable Lead Implant Date: 20020206
Implantable Lead Implant Date: 20071005
Implantable Lead Implant Date: 20220624
Implantable Lead Implant Date: 20220624
Implantable Lead Location: 753858
Implantable Lead Location: 753859
Implantable Lead Location: 753860
Implantable Lead Location: 753860
Implantable Lead Model: 148
Implantable Lead Model: 4088
Implantable Lead Model: 4677
Implantable Lead Model: 7841
Implantable Lead Serial Number: 1073796
Implantable Lead Serial Number: 109512
Implantable Lead Serial Number: 224392
Implantable Lead Serial Number: 824442
Implantable Pulse Generator Implant Date: 20220624
Lead Channel Impedance Value: 445 Ohm
Lead Channel Impedance Value: 532 Ohm
Lead Channel Impedance Value: 624 Ohm
Lead Channel Pacing Threshold Amplitude: 0.7 V
Lead Channel Pacing Threshold Pulse Width: 0.4 ms
Lead Channel Setting Pacing Amplitude: 2 V
Lead Channel Setting Pacing Amplitude: 2.5 V
Lead Channel Setting Pacing Amplitude: 3.5 V
Lead Channel Setting Pacing Pulse Width: 0.4 ms
Lead Channel Setting Pacing Pulse Width: 1.2 ms
Lead Channel Setting Sensing Sensitivity: 0.5 mV
Lead Channel Setting Sensing Sensitivity: 1 mV
Pulse Gen Serial Number: 385552

## 2022-11-22 ENCOUNTER — Other Ambulatory Visit: Payer: Self-pay | Admitting: Cardiology

## 2022-11-22 NOTE — Progress Notes (Unsigned)
  Cardiology Office Note:   Date:  11/24/2022  ID:  [name], DOB 21-Apr-1934, MRN [id number] PCP: Etta Grandchild, MD  [facility] Providers Cardiologist:  [name], MD Electrophysiologist:  Lewayne Bunting, MD {  History of Present Illness:   [name] is a 89 y.o. male who presents for followup of his coronary disease and cardiomyopathy.   I was not able to titrate Entresto because of hypotension.  He has ejection fraction 25%.  He has had CRT-D upgrade.   He comes back today and he has not been eating.  He has no new shortness of breath, PND or orthopnea.  He has no new palpitations, presyncope or syncope.  He has no chest pressure, neck or arm discomfort.  He has been progressively weaker particularly in the morning.  He has had chronic lower extremity swelling and does try to keep his feet elevated.  He does not want to wear compression stockings however.  His blood pressure has been running lower.  He has been having frequent loose stools and is due to see his primary provider soon.  He is not eating very much.   ROS: As stated in the HPI and negative for all other systems.  Studies Reviewed:    EKG:   NA  Risk Assessment/Calculations:              Physical Exam:   VS:  BP (!) 108/50   Pulse 70   Ht 5\' 7"  (1.702 m)   Wt 156 lb 3.2 oz (70.9 kg)   BMI 24.46 kg/m    Wt Readings from Last 3 Encounters:  11/24/22 156 lb 3.2 oz (70.9 kg)  09/22/22 158 lb (71.7 kg)  08/03/22 160 lb (72.6 kg)     GEN: Well nourished, well developed in no acute distress NECK: No JVD; No carotid bruits CARDIAC: RRR, no murmurs, rubs, gallops RESPIRATORY:  Clear to auscultation without rales, wheezing or rhonchi  ABDOMEN: Soft, non-tender, non-distended EXTREMITIES: Moderate bilateral lower extremity edema; No deformity   ASSESSMENT AND PLAN:   CAD:  The patient has no new sypmtoms.  No further cardiovascular testing is indicated.  We will continue with aggressive risk  reduction and meds as listed.   HTN:    His blood pressure is low and he is feeling weak.  He has not tolerated med titration. I am going to make changes as below.     DYSLIPIDEMIA:    His LDL was 57.  No change in therapy.     CARDIOMYOPATHY:    He cannot tolerate compression stockings.  He keeps his feet elevated.  He watches his salt and fluid.   Given his weakness and with desire for more conservative therapies I will stop his hydralazine and Imdur.   CRT D: He is up-to-date with follow-up.  CKD II:   Last creatinine was 1.89.  No change in therapy.  This is up slightly with his diarrhea.      Follow up me in one year.   Signed, [name], MD

## 2022-11-24 ENCOUNTER — Encounter: Payer: Self-pay | Admitting: Cardiology

## 2022-11-24 ENCOUNTER — Ambulatory Visit: Payer: Medicare Other | Attending: Cardiology | Admitting: Cardiology

## 2022-11-24 VITALS — BP 108/50 | HR 70 | Ht 67.0 in | Wt 156.2 lb

## 2022-11-24 DIAGNOSIS — I2510 Atherosclerotic heart disease of native coronary artery without angina pectoris: Secondary | ICD-10-CM | POA: Diagnosis not present

## 2022-11-24 DIAGNOSIS — E785 Hyperlipidemia, unspecified: Secondary | ICD-10-CM | POA: Diagnosis not present

## 2022-11-24 DIAGNOSIS — I10 Essential (primary) hypertension: Secondary | ICD-10-CM | POA: Diagnosis not present

## 2022-11-24 DIAGNOSIS — N182 Chronic kidney disease, stage 2 (mild): Secondary | ICD-10-CM | POA: Diagnosis not present

## 2022-11-24 NOTE — Patient Instructions (Signed)
Medication Instructions:   STOP HYDRALAZINE   STOP ISOSORBIDE  *If you need a refill on your cardiac medications before your next appointment, please call your pharmacy*   Follow-Up: At [facility], you and your health needs are our priority.  As part of our continuing mission to provide you with exceptional heart care, we have created designated Provider Care Teams.  These Care Teams include your primary Cardiologist (physician) and Advanced Practice Providers (APPs -  Physician Assistants and Nurse Practitioners) who all work together to provide you with the care you need, when you need it.  We recommend signing up for the patient portal called "MyChart".  Sign up information is provided on this After Visit Summary.  MyChart is used to connect with patients for Virtual Visits (Telemedicine).  Patients are able to view lab/test results, encounter notes, upcoming appointments, etc.  Non-urgent messages can be sent to your provider as well.   To learn more about what you can do with MyChart, go to [email or web address].    Your next appointment:   12 month(s)  Provider:   [name], MD

## 2022-12-08 NOTE — Progress Notes (Signed)
Remote ICD transmission.   

## 2022-12-12 ENCOUNTER — Other Ambulatory Visit: Payer: Self-pay | Admitting: Internal Medicine

## 2022-12-12 DIAGNOSIS — I6523 Occlusion and stenosis of bilateral carotid arteries: Secondary | ICD-10-CM

## 2022-12-12 DIAGNOSIS — I739 Peripheral vascular disease, unspecified: Secondary | ICD-10-CM

## 2022-12-22 ENCOUNTER — Ambulatory Visit (INDEPENDENT_AMBULATORY_CARE_PROVIDER_SITE_OTHER): Payer: Medicare Other | Admitting: Internal Medicine

## 2022-12-22 ENCOUNTER — Encounter: Payer: Self-pay | Admitting: Internal Medicine

## 2022-12-22 VITALS — BP 118/78 | HR 79 | Temp 97.8°F | Ht 67.0 in | Wt 151.0 lb

## 2022-12-22 DIAGNOSIS — N1832 Chronic kidney disease, stage 3b: Secondary | ICD-10-CM | POA: Diagnosis not present

## 2022-12-22 DIAGNOSIS — E039 Hypothyroidism, unspecified: Secondary | ICD-10-CM

## 2022-12-22 DIAGNOSIS — D538 Other specified nutritional anemias: Secondary | ICD-10-CM | POA: Diagnosis not present

## 2022-12-22 LAB — CBC WITH DIFFERENTIAL/PLATELET
Basophils Absolute: 0.1 10*3/uL (ref 0.0–0.1)
Basophils Relative: 1.4 % (ref 0.0–3.0)
Eosinophils Absolute: 0.3 10*3/uL (ref 0.0–0.7)
Eosinophils Relative: 4 % (ref 0.0–5.0)
HCT: 28.2 % — ABNORMAL LOW (ref 39.0–52.0)
Hemoglobin: 9.1 g/dL — ABNORMAL LOW (ref 13.0–17.0)
Lymphocytes Relative: 26.8 % (ref 12.0–46.0)
Lymphs Abs: 1.8 10*3/uL (ref 0.7–4.0)
MCHC: 32.2 g/dL (ref 30.0–36.0)
MCV: 95.9 fl (ref 78.0–100.0)
Monocytes Absolute: 0.4 10*3/uL (ref 0.1–1.0)
Monocytes Relative: 5.4 % (ref 3.0–12.0)
Neutro Abs: 4.3 10*3/uL (ref 1.4–7.7)
Neutrophils Relative %: 62.4 % (ref 43.0–77.0)
Platelets: 231 10*3/uL (ref 150.0–400.0)
RBC: 2.94 Mil/uL — ABNORMAL LOW (ref 4.22–5.81)
RDW: 23.5 % — ABNORMAL HIGH (ref 11.5–15.5)
WBC: 6.9 10*3/uL (ref 4.0–10.5)

## 2022-12-22 LAB — TSH: TSH: 18.67 u[IU]/mL — ABNORMAL HIGH (ref 0.35–5.50)

## 2022-12-22 MED ORDER — LEVOTHYROXINE SODIUM 150 MCG PO TABS
150.0000 ug | ORAL_TABLET | Freq: Every day | ORAL | 1 refills | Status: DC
Start: 2022-12-22 — End: 2023-03-18

## 2022-12-22 NOTE — Progress Notes (Signed)
Subjective:  Patient ID: [name], male    DOB: April 17, 1934  Age: 89 y.o. MRN: [id number]  CC: Hypothyroidism and Anemia   HPI Ariel Wingrove Grigg presents for f/up -  He complains of chronic, unchanged dizziness and lower extremity edema.  Outpatient Medications Prior to Visit  Medication Sig Dispense Refill   acetaminophen (TYLENOL) 650 MG CR tablet Take 650 mg by mouth every 8 (eight) hours as needed for pain.     aspirin EC 81 MG tablet Take 1 tablet (81 mg total) by mouth in the morning. Swallow whole. 30 tablet 11   atorvastatin (LIPITOR) 40 MG tablet TAKE 1 TABLET (40 MG TOTAL) BY MOUTH IN THE MORNING 90 tablet 3   clopidogrel (PLAVIX) 75 MG tablet TAKE 1 TABLET (75 MG TOTAL) BY MOUTH IN THE MORNING 90 tablet 0   docusate sodium (COLACE) 100 MG capsule Take 1 capsule (100 mg total) by mouth 2 (two) times daily as needed for mild constipation. 10 capsule 0   famotidine (PEPCID) 40 MG tablet Take 40 mg by mouth 2 (two) times daily.     folic acid (FOLVITE) 1 MG tablet Take 1 tablet (1 mg total) by mouth daily. 90 tablet 0   furosemide (LASIX) 20 MG tablet TAKE 1 TABLET (20 MG TOTAL) BY MOUTH IN THE MORNING 90 tablet 0   metoprolol succinate (TOPROL-XL) 50 MG 24 hr tablet Take 1 tablet (50 mg total) by mouth 2 (two) times daily. Take with or immediately following a meal. 60 tablet 5   mirtazapine (REMERON) 30 MG tablet TAKE 1 TABLET BY MOUTH EVERY DAY 90 tablet 1   montelukast (SINGULAIR) 10 MG tablet TAKE 1 TABLET BY MOUTH EVERY DAY 90 tablet 1   Multiple Vitamin (MULTIVITAMIN WITH MINERALS) TABS tablet Take 1 tablet by mouth daily. 30 tablet 0   pantoprazole (PROTONIX) 40 MG tablet TAKE 1 TABLET (40 MG TOTAL) BY MOUTH IN THE MORNING AND AT BEDTIME. 180 tablet 0   REFRESH OPTIVE PF 0.5-0.9 % SOLN Place 1 drop into both eyes daily.     TUMS E-X 750 750 MG chewable tablet Chew 1-4 tablets by mouth daily.     Vaseline Petrolatum Gauze (VASELINE GAUZE) Apply topically daily. 15 each  0   WIXELA INHUB 250-50 MCG/ACT AEPB Inhale 1 puff into the lungs in the morning and at bedtime.     zinc gluconate 50 MG tablet Take 1 tablet (50 mg total) by mouth daily. 90 tablet 1   levothyroxine (SYNTHROID) 125 MCG tablet Take 1 tablet (125 mcg total) by mouth daily before breakfast. 90 tablet 0   No facility-administered medications prior to visit.    ROS Review of Systems  Constitutional:  Positive for fatigue. Negative for diaphoresis and unexpected weight change.  HENT: Negative.    Eyes: Negative.   Respiratory:  Negative for cough, chest tightness, shortness of breath and wheezing.   Cardiovascular:  Positive for leg swelling. Negative for chest pain and palpitations.  Gastrointestinal:  Negative for abdominal pain, diarrhea and rectal pain.  Genitourinary: Negative.  Negative for difficulty urinating.  Musculoskeletal: Negative.   Skin: Negative.   Neurological:  Positive for dizziness. Negative for weakness and numbness.  Hematological:  Does not bruise/bleed easily.  Psychiatric/Behavioral: Negative.      Objective:  BP 118/78 (BP Location: Right Arm, Patient Position: Sitting, Cuff Size: Large)   Pulse 79   Temp 97.8 F (36.6 C) (Oral)   Ht 5\' 7"  (1.702 m)  Wt 151 lb (68.5 kg)   SpO2 92%   BMI 23.65 kg/m   BP Readings from Last 3 Encounters:  12/22/22 118/78  11/24/22 (!) 108/50  09/22/22 136/64    Wt Readings from Last 3 Encounters:  12/22/22 151 lb (68.5 kg)  11/24/22 156 lb 3.2 oz (70.9 kg)  09/22/22 158 lb (71.7 kg)    Physical Exam Vitals reviewed.  Constitutional:      General: He is not in acute distress.    Appearance: He is ill-appearing. He is not toxic-appearing or diaphoretic.  HENT:     Nose: Nose normal.     Mouth/Throat:     Mouth: Mucous membranes are moist.  Eyes:     General: No scleral icterus.    Conjunctiva/sclera: Conjunctivae normal.  Cardiovascular:     Rate and Rhythm: Normal rate and regular rhythm.     Pulses:  Normal pulses.     Heart sounds: No murmur heard.    No friction rub. No gallop.  Pulmonary:     Effort: Pulmonary effort is normal.     Breath sounds: No wheezing, rhonchi or rales.  Abdominal:     General: Abdomen is flat.     Palpations: There is no mass.     Tenderness: There is no abdominal tenderness. There is no guarding.     Hernia: No hernia is present.  Musculoskeletal:     Cervical back: Neck supple.     Right lower leg: 2+ Pitting Edema present.     Left lower leg: 2+ Pitting Edema present.  Skin:    Coloration: Skin is pale.  Neurological:     Mental Status: Mental status is at baseline.  Psychiatric:        Mood and Affect: Mood normal.     Lab Results  Component Value Date   WBC 6.9 12/22/2022   HGB 9.1 (L) 12/22/2022   HCT 28.2 (L) 12/22/2022   PLT 231.0 12/22/2022   GLUCOSE 92 09/22/2022   CHOL 113 09/22/2022   TRIG 83.0 09/22/2022   HDL 39.90 09/22/2022   LDLCALC 57 09/22/2022   ALT 8 06/11/2022   AST 19 06/11/2022   NA 137 09/22/2022   K 4.7 09/22/2022   CL 103 09/22/2022   CREATININE 1.78 (H) 11/09/2022   BUN 36 (H) 11/09/2022   CO2 28 09/22/2022   TSH 18.67 (H) 12/22/2022   INR 1.3 (H) 06/11/2022    No results found.  Assessment & Plan:   Anemia due to zinc deficiency- Will continue the zinc supplement. -     CBC with Differential/Platelet; Future  Acquired hypothyroidism- His TSH is nearly 19.  Will increase the T4 dosage. -     TSH; Future -     Levothyroxine Sodium; Take 1 tablet (150 mcg total) by mouth daily before breakfast.  Dispense: 90 tablet; Refill: 1  Stage 3b chronic kidney disease (HCC)- His renal function has been stable.     Follow-up: Return in about 6 months (around 06/24/2023).  Sanda Linger, MD

## 2022-12-22 NOTE — Patient Instructions (Signed)

## 2022-12-24 ENCOUNTER — Other Ambulatory Visit: Payer: Self-pay | Admitting: Internal Medicine

## 2022-12-24 DIAGNOSIS — E039 Hypothyroidism, unspecified: Secondary | ICD-10-CM

## 2022-12-24 DIAGNOSIS — D520 Dietary folate deficiency anemia: Secondary | ICD-10-CM

## 2023-01-05 ENCOUNTER — Ambulatory Visit: Payer: Medicare Other | Admitting: Family Medicine

## 2023-01-06 ENCOUNTER — Ambulatory Visit: Payer: Medicare Other | Admitting: Podiatry

## 2023-01-08 ENCOUNTER — Other Ambulatory Visit: Payer: Self-pay | Admitting: Internal Medicine

## 2023-01-08 DIAGNOSIS — J4530 Mild persistent asthma, uncomplicated: Secondary | ICD-10-CM

## 2023-01-12 ENCOUNTER — Telehealth: Payer: Self-pay | Admitting: Internal Medicine

## 2023-01-12 NOTE — Telephone Encounter (Signed)
Patient states that he gets dizzy every morning when taking his medicaiton - what should he do?  Please call [phone]

## 2023-01-13 ENCOUNTER — Other Ambulatory Visit: Payer: Self-pay | Admitting: Cardiology

## 2023-01-15 ENCOUNTER — Telehealth: Payer: Self-pay | Admitting: Internal Medicine

## 2023-01-15 NOTE — Telephone Encounter (Signed)
 Patient states that he gets dizzy every morning when taking his medicaiton - what should he do?  Please call [phone]

## 2023-01-27 ENCOUNTER — Ambulatory Visit: Payer: Medicare Other | Admitting: Podiatry

## 2023-02-03 ENCOUNTER — Ambulatory Visit: Payer: Medicare Other | Admitting: Podiatry

## 2023-02-12 ENCOUNTER — Ambulatory Visit (HOSPITAL_COMMUNITY)
Admission: RE | Admit: 2023-02-12 | Discharge: 2023-02-12 | Disposition: A | Discharge status: Home / Self Care | Payer: Medicare Other | Source: Ambulatory Visit | Attending: Adult Health | Admitting: Adult Health

## 2023-02-12 DIAGNOSIS — I6523 Occlusion and stenosis of bilateral carotid arteries: Secondary | ICD-10-CM | POA: Diagnosis not present

## 2023-02-12 DIAGNOSIS — I779 Disorder of arteries and arterioles, unspecified: Secondary | ICD-10-CM | POA: Insufficient documentation

## 2023-02-16 ENCOUNTER — Other Ambulatory Visit: Payer: Self-pay | Admitting: Internal Medicine

## 2023-02-16 ENCOUNTER — Telehealth: Payer: Self-pay

## 2023-02-16 DIAGNOSIS — I472 Ventricular tachycardia, unspecified: Secondary | ICD-10-CM

## 2023-02-16 NOTE — Telephone Encounter (Addendum)
Called patient regarding results. Spoke with patients daughter  [name] . Patients daughter had understanding of results.----- Message from Joni Reining sent at 02/14/2023 11:32 AM EDT ----- I have reviewed the carotid ultrasound results. There are no blockages in the carotid arteries that are of concern causing his symptoms of dizziness and blurred vision.  Good report. Will need to follow up with PCP for further testing.    KL

## 2023-02-17 ENCOUNTER — Ambulatory Visit: Payer: Medicare Other | Admitting: Podiatry

## 2023-02-19 ENCOUNTER — Ambulatory Visit (INDEPENDENT_AMBULATORY_CARE_PROVIDER_SITE_OTHER): Payer: Medicare Other

## 2023-02-19 DIAGNOSIS — I255 Ischemic cardiomyopathy: Secondary | ICD-10-CM

## 2023-02-26 ENCOUNTER — Other Ambulatory Visit: Payer: Self-pay | Admitting: Internal Medicine

## 2023-02-26 DIAGNOSIS — D520 Dietary folate deficiency anemia: Secondary | ICD-10-CM

## 2023-03-01 NOTE — Progress Notes (Signed)
Remote ICD transmission.   

## 2023-03-10 ENCOUNTER — Other Ambulatory Visit: Payer: Self-pay | Admitting: Internal Medicine

## 2023-03-10 DIAGNOSIS — I739 Peripheral vascular disease, unspecified: Secondary | ICD-10-CM

## 2023-03-10 DIAGNOSIS — I6523 Occlusion and stenosis of bilateral carotid arteries: Secondary | ICD-10-CM

## 2023-03-12 ENCOUNTER — Encounter (HOSPITAL_COMMUNITY): Payer: Self-pay

## 2023-03-12 ENCOUNTER — Inpatient Hospital Stay (HOSPITAL_COMMUNITY)
Admission: EM | Admit: 2023-03-12 | Discharge: 2023-03-18 | DRG: 640 | Disposition: A | Discharge status: Hospice | Payer: Medicare Other | Attending: Internal Medicine | Admitting: Internal Medicine

## 2023-03-12 ENCOUNTER — Emergency Department (HOSPITAL_COMMUNITY): Payer: Medicare Other

## 2023-03-12 ENCOUNTER — Other Ambulatory Visit: Payer: Self-pay

## 2023-03-12 DIAGNOSIS — N179 Acute kidney failure, unspecified: Secondary | ICD-10-CM | POA: Diagnosis present

## 2023-03-12 DIAGNOSIS — E860 Dehydration: Secondary | ICD-10-CM | POA: Diagnosis not present

## 2023-03-12 DIAGNOSIS — I4729 Other ventricular tachycardia: Secondary | ICD-10-CM | POA: Diagnosis not present

## 2023-03-12 DIAGNOSIS — R627 Adult failure to thrive: Secondary | ICD-10-CM

## 2023-03-12 DIAGNOSIS — Z043 Encounter for examination and observation following other accident: Secondary | ICD-10-CM | POA: Diagnosis not present

## 2023-03-12 DIAGNOSIS — Y92009 Unspecified place in unspecified non-institutional (private) residence as the place of occurrence of the external cause: Secondary | ICD-10-CM

## 2023-03-12 DIAGNOSIS — F03A0 Unspecified dementia, mild, without behavioral disturbance, psychotic disturbance, mood disturbance, and anxiety: Secondary | ICD-10-CM | POA: Diagnosis present

## 2023-03-12 DIAGNOSIS — I252 Old myocardial infarction: Secondary | ICD-10-CM

## 2023-03-12 DIAGNOSIS — K7460 Unspecified cirrhosis of liver: Secondary | ICD-10-CM | POA: Diagnosis not present

## 2023-03-12 DIAGNOSIS — Z515 Encounter for palliative care: Secondary | ICD-10-CM

## 2023-03-12 DIAGNOSIS — K219 Gastro-esophageal reflux disease without esophagitis: Secondary | ICD-10-CM | POA: Diagnosis not present

## 2023-03-12 DIAGNOSIS — I472 Ventricular tachycardia, unspecified: Secondary | ICD-10-CM | POA: Diagnosis present

## 2023-03-12 DIAGNOSIS — I255 Ischemic cardiomyopathy: Secondary | ICD-10-CM | POA: Diagnosis present

## 2023-03-12 DIAGNOSIS — W07XXXA Fall from chair, initial encounter: Secondary | ICD-10-CM | POA: Diagnosis present

## 2023-03-12 DIAGNOSIS — D631 Anemia in chronic kidney disease: Secondary | ICD-10-CM | POA: Diagnosis present

## 2023-03-12 DIAGNOSIS — R571 Hypovolemic shock: Secondary | ICD-10-CM | POA: Diagnosis not present

## 2023-03-12 DIAGNOSIS — Z7901 Long term (current) use of anticoagulants: Secondary | ICD-10-CM

## 2023-03-12 DIAGNOSIS — Z7982 Long term (current) use of aspirin: Secondary | ICD-10-CM

## 2023-03-12 DIAGNOSIS — I5042 Chronic combined systolic (congestive) and diastolic (congestive) heart failure: Secondary | ICD-10-CM | POA: Diagnosis not present

## 2023-03-12 DIAGNOSIS — I130 Hypertensive heart and chronic kidney disease with heart failure and stage 1 through stage 4 chronic kidney disease, or unspecified chronic kidney disease: Secondary | ICD-10-CM | POA: Diagnosis not present

## 2023-03-12 DIAGNOSIS — R338 Other retention of urine: Secondary | ICD-10-CM | POA: Diagnosis not present

## 2023-03-12 DIAGNOSIS — E871 Hypo-osmolality and hyponatremia: Secondary | ICD-10-CM | POA: Diagnosis not present

## 2023-03-12 DIAGNOSIS — I6782 Cerebral ischemia: Secondary | ICD-10-CM | POA: Diagnosis not present

## 2023-03-12 DIAGNOSIS — K760 Fatty (change of) liver, not elsewhere classified: Secondary | ICD-10-CM | POA: Diagnosis not present

## 2023-03-12 DIAGNOSIS — R531 Weakness: Secondary | ICD-10-CM

## 2023-03-12 DIAGNOSIS — R42 Dizziness and giddiness: Principal | ICD-10-CM

## 2023-03-12 DIAGNOSIS — R404 Transient alteration of awareness: Secondary | ICD-10-CM | POA: Diagnosis not present

## 2023-03-12 DIAGNOSIS — Z7902 Long term (current) use of antithrombotics/antiplatelets: Secondary | ICD-10-CM

## 2023-03-12 DIAGNOSIS — Z888 Allergy status to other drugs, medicaments and biological substances status: Secondary | ICD-10-CM

## 2023-03-12 DIAGNOSIS — E785 Hyperlipidemia, unspecified: Secondary | ICD-10-CM | POA: Diagnosis present

## 2023-03-12 DIAGNOSIS — N1832 Chronic kidney disease, stage 3b: Secondary | ICD-10-CM | POA: Diagnosis present

## 2023-03-12 DIAGNOSIS — Z7189 Other specified counseling: Secondary | ICD-10-CM

## 2023-03-12 DIAGNOSIS — R0989 Other specified symptoms and signs involving the circulatory and respiratory systems: Secondary | ICD-10-CM | POA: Diagnosis not present

## 2023-03-12 DIAGNOSIS — E162 Hypoglycemia, unspecified: Principal | ICD-10-CM | POA: Diagnosis present

## 2023-03-12 DIAGNOSIS — Z7401 Bed confinement status: Secondary | ICD-10-CM | POA: Diagnosis not present

## 2023-03-12 DIAGNOSIS — R55 Syncope and collapse: Secondary | ICD-10-CM | POA: Diagnosis not present

## 2023-03-12 DIAGNOSIS — Z9049 Acquired absence of other specified parts of digestive tract: Secondary | ICD-10-CM

## 2023-03-12 DIAGNOSIS — E039 Hypothyroidism, unspecified: Secondary | ICD-10-CM | POA: Diagnosis present

## 2023-03-12 DIAGNOSIS — I2510 Atherosclerotic heart disease of native coronary artery without angina pectoris: Secondary | ICD-10-CM | POA: Diagnosis present

## 2023-03-12 DIAGNOSIS — Z79899 Other long term (current) drug therapy: Secondary | ICD-10-CM

## 2023-03-12 DIAGNOSIS — E161 Other hypoglycemia: Secondary | ICD-10-CM | POA: Diagnosis not present

## 2023-03-12 DIAGNOSIS — I5043 Acute on chronic combined systolic (congestive) and diastolic (congestive) heart failure: Secondary | ICD-10-CM

## 2023-03-12 DIAGNOSIS — J90 Pleural effusion, not elsewhere classified: Secondary | ICD-10-CM | POA: Diagnosis not present

## 2023-03-12 DIAGNOSIS — N401 Enlarged prostate with lower urinary tract symptoms: Secondary | ICD-10-CM | POA: Diagnosis present

## 2023-03-12 DIAGNOSIS — R9431 Abnormal electrocardiogram [ECG] [EKG]: Secondary | ICD-10-CM | POA: Diagnosis not present

## 2023-03-12 DIAGNOSIS — I890 Lymphedema, not elsewhere classified: Secondary | ICD-10-CM | POA: Diagnosis present

## 2023-03-12 DIAGNOSIS — Z91048 Other nonmedicinal substance allergy status: Secondary | ICD-10-CM

## 2023-03-12 DIAGNOSIS — I959 Hypotension, unspecified: Secondary | ICD-10-CM | POA: Diagnosis not present

## 2023-03-12 DIAGNOSIS — I672 Cerebral atherosclerosis: Secondary | ICD-10-CM | POA: Diagnosis not present

## 2023-03-12 DIAGNOSIS — W19XXXA Unspecified fall, initial encounter: Secondary | ICD-10-CM

## 2023-03-12 DIAGNOSIS — I4892 Unspecified atrial flutter: Secondary | ICD-10-CM | POA: Diagnosis not present

## 2023-03-12 DIAGNOSIS — Z87891 Personal history of nicotine dependence: Secondary | ICD-10-CM

## 2023-03-12 DIAGNOSIS — E43 Unspecified severe protein-calorie malnutrition: Secondary | ICD-10-CM | POA: Diagnosis not present

## 2023-03-12 DIAGNOSIS — E861 Hypovolemia: Secondary | ICD-10-CM | POA: Diagnosis present

## 2023-03-12 DIAGNOSIS — Z66 Do not resuscitate: Secondary | ICD-10-CM | POA: Diagnosis not present

## 2023-03-12 DIAGNOSIS — R932 Abnormal findings on diagnostic imaging of liver and biliary tract: Secondary | ICD-10-CM | POA: Diagnosis not present

## 2023-03-12 DIAGNOSIS — Z882 Allergy status to sulfonamides status: Secondary | ICD-10-CM

## 2023-03-12 DIAGNOSIS — I4510 Unspecified right bundle-branch block: Secondary | ICD-10-CM | POA: Diagnosis present

## 2023-03-12 DIAGNOSIS — Z955 Presence of coronary angioplasty implant and graft: Secondary | ICD-10-CM

## 2023-03-12 DIAGNOSIS — J4489 Other specified chronic obstructive pulmonary disease: Secondary | ICD-10-CM | POA: Diagnosis present

## 2023-03-12 DIAGNOSIS — Z95810 Presence of automatic (implantable) cardiac defibrillator: Secondary | ICD-10-CM | POA: Diagnosis present

## 2023-03-12 DIAGNOSIS — Z79890 Hormone replacement therapy (postmenopausal): Secondary | ICD-10-CM

## 2023-03-12 DIAGNOSIS — Z6822 Body mass index (BMI) 22.0-22.9, adult: Secondary | ICD-10-CM

## 2023-03-12 LAB — MAGNESIUM: Magnesium: 1.8 mg/dL (ref 1.7–2.4)

## 2023-03-12 LAB — COMPREHENSIVE METABOLIC PANEL
ALT: 11 U/L (ref 0–44)
AST: 23 U/L (ref 15–41)
Albumin: 3.6 g/dL (ref 3.5–5.0)
Alkaline Phosphatase: 46 U/L (ref 38–126)
Anion gap: 10 (ref 5–15)
BUN: 73 mg/dL — ABNORMAL HIGH (ref 8–23)
CO2: 24 mmol/L (ref 22–32)
Calcium: 9 mg/dL (ref 8.9–10.3)
Chloride: 100 mmol/L (ref 98–111)
Creatinine, Ser: 2.81 mg/dL — ABNORMAL HIGH (ref 0.61–1.24)
GFR, Estimated: 21 mL/min — ABNORMAL LOW (ref >60)
Glucose, Bld: 29 mg/dL — CL (ref 70–99)
Potassium: 4.2 mmol/L (ref 3.5–5.1)
Sodium: 134 mmol/L — ABNORMAL LOW (ref 135–145)
Total Bilirubin: 0.6 mg/dL (ref 0.3–1.2)
Total Protein: 9.1 g/dL — ABNORMAL HIGH (ref 6.5–8.1)

## 2023-03-12 LAB — I-STAT CG4 LACTIC ACID, ED
Lactic Acid, Venous: 1 mmol/L (ref 0.5–1.9)
Lactic Acid, Venous: 1.3 mmol/L (ref 0.5–1.9)

## 2023-03-12 LAB — URINALYSIS, ROUTINE W REFLEX MICROSCOPIC
Bilirubin Urine: NEGATIVE
Glucose, UA: NEGATIVE mg/dL
Hgb urine dipstick: NEGATIVE
Ketones, ur: NEGATIVE mg/dL
Leukocytes,Ua: NEGATIVE
Nitrite: NEGATIVE
Protein, ur: NEGATIVE mg/dL
Specific Gravity, Urine: 1.011 (ref 1.005–1.030)
pH: 5 (ref 5.0–8.0)

## 2023-03-12 LAB — CBG MONITORING, ED
Glucose-Capillary: 14 mg/dL — CL (ref 70–99)
Glucose-Capillary: 20 mg/dL — CL (ref 70–99)
Glucose-Capillary: 41 mg/dL — CL (ref 70–99)
Glucose-Capillary: 333 mg/dL — ABNORMAL HIGH (ref 70–99)
Glucose-Capillary: 38 mg/dL — CL (ref 70–99)
Glucose-Capillary: 58 mg/dL — ABNORMAL LOW (ref 70–99)
Glucose-Capillary: 81 mg/dL (ref 70–99)
Glucose-Capillary: 114 mg/dL — ABNORMAL HIGH (ref 70–99)
Glucose-Capillary: 71 mg/dL (ref 70–99)
Glucose-Capillary: 100 mg/dL — ABNORMAL HIGH (ref 70–99)
Glucose-Capillary: 59 mg/dL — ABNORMAL LOW (ref 70–99)
Glucose-Capillary: 77 mg/dL (ref 70–99)
Glucose-Capillary: 80 mg/dL (ref 70–99)

## 2023-03-12 LAB — CBC WITH DIFFERENTIAL/PLATELET
Abs Immature Granulocytes: 0.05 10*3/uL (ref 0.00–0.07)
Basophils Absolute: 0 10*3/uL (ref 0.0–0.1)
Basophils Relative: 0 %
Eosinophils Absolute: 0.1 10*3/uL (ref 0.0–0.5)
Eosinophils Relative: 1 %
HCT: 31.6 % — ABNORMAL LOW (ref 39.0–52.0)
Hemoglobin: 9.9 g/dL — ABNORMAL LOW (ref 13.0–17.0)
Immature Granulocytes: 1 %
Lymphocytes Relative: 15 %
Lymphs Abs: 1.6 10*3/uL (ref 0.7–4.0)
MCH: 33 pg (ref 26.0–34.0)
MCHC: 31.3 g/dL (ref 30.0–36.0)
MCV: 105.3 fL — ABNORMAL HIGH (ref 80.0–100.0)
Monocytes Absolute: 0.5 10*3/uL (ref 0.1–1.0)
Monocytes Relative: 4 %
Neutro Abs: 8.1 10*3/uL — ABNORMAL HIGH (ref 1.7–7.7)
Neutrophils Relative %: 79 %
Platelets: 269 10*3/uL (ref 150–400)
RBC: 3 MIL/uL — ABNORMAL LOW (ref 4.22–5.81)
RDW: 21.1 % — ABNORMAL HIGH (ref 11.5–15.5)
WBC: 10.3 10*3/uL (ref 4.0–10.5)
nRBC: 0.2 % (ref 0.0–0.2)

## 2023-03-12 LAB — I-STAT CHEM 8, ED
BUN: 67 mg/dL — ABNORMAL HIGH (ref 8–23)
Calcium, Ion: 1.2 mmol/L (ref 1.15–1.40)
Chloride: 102 mmol/L (ref 98–111)
Creatinine, Ser: 3.1 mg/dL — ABNORMAL HIGH (ref 0.61–1.24)
Glucose, Bld: 25 mg/dL — CL (ref 70–99)
HCT: 30 % — ABNORMAL LOW (ref 39.0–52.0)
Hemoglobin: 10.2 g/dL — ABNORMAL LOW (ref 13.0–17.0)
Potassium: 4.5 mmol/L (ref 3.5–5.1)
Sodium: 138 mmol/L (ref 135–145)
TCO2: 25 mmol/L (ref 22–32)

## 2023-03-12 LAB — PROTIME-INR
INR: 1.3 — ABNORMAL HIGH (ref 0.8–1.2)
Prothrombin Time: 16.1 s — ABNORMAL HIGH (ref 11.4–15.2)

## 2023-03-12 LAB — TROPONIN I (HIGH SENSITIVITY)
Troponin I (High Sensitivity): 17 ng/L (ref <18)
Troponin I (High Sensitivity): 18 ng/L — ABNORMAL HIGH (ref <18)

## 2023-03-12 LAB — APTT: aPTT: 37 s — ABNORMAL HIGH (ref 24–36)

## 2023-03-12 MED ORDER — DEXTROSE 50 % IV SOLN
INTRAVENOUS | Status: AC
  Administered 2023-03-12: 25 g via INTRAVENOUS
  Filled 2023-03-12: qty 50

## 2023-03-12 MED ORDER — HEPARIN SODIUM (PORCINE) 5000 UNIT/ML IJ SOLN
5000.0000 [IU] | Freq: Three times a day (TID) | INTRAMUSCULAR | Status: DC
  Administered 2023-03-13 – 2023-03-18 (×12): 5000 [IU] via SUBCUTANEOUS
  Filled 2023-03-12 (×13): qty 1

## 2023-03-12 MED ORDER — DEXTROSE 50 % IV SOLN
1.0000 | Freq: Once | INTRAVENOUS | Status: AC
  Administered 2023-03-12: 50 mL via INTRAVENOUS
  Filled 2023-03-12: qty 50

## 2023-03-12 MED ORDER — FOLIC ACID 1 MG PO TABS
1.0000 mg | ORAL_TABLET | Freq: Every day | ORAL | Status: DC
  Administered 2023-03-13 – 2023-03-18 (×6): 1 mg via ORAL
  Filled 2023-03-12 (×6): qty 1

## 2023-03-12 MED ORDER — ASPIRIN 81 MG PO TBEC: 81.0000 mg | DELAYED_RELEASE_TABLET | Freq: Every morning | ORAL | Status: DC

## 2023-03-12 MED ORDER — DEXTROSE 50 % IV SOLN: 25.0000 g | Freq: Once | INTRAVENOUS | Status: AC

## 2023-03-12 MED ORDER — LEVOTHYROXINE SODIUM 25 MCG PO TABS
125.0000 ug | ORAL_TABLET | Freq: Every day | ORAL | Status: DC
  Filled 2023-03-12: qty 1

## 2023-03-12 MED ORDER — POLYETHYLENE GLYCOL 3350 17 G PO PACK: 17.0000 g | PACK | Freq: Every day | ORAL | Status: DC | PRN

## 2023-03-12 MED ORDER — ACETAMINOPHEN 325 MG PO TABS
650.0000 mg | ORAL_TABLET | ORAL | Status: DC | PRN
  Administered 2023-03-16: 650 mg via ORAL
  Filled 2023-03-12 (×2): qty 2

## 2023-03-12 MED ORDER — DEXTROSE 10 % IV SOLN: INTRAVENOUS | Status: DC

## 2023-03-12 MED ORDER — DOCUSATE SODIUM 100 MG PO CAPS: 100.0000 mg | ORAL_CAPSULE | Freq: Two times a day (BID) | ORAL | Status: DC | PRN

## 2023-03-12 MED ORDER — CALCIUM CHLORIDE 10 % IV SOLN: 1.0000 g | Freq: Once | INTRAVENOUS | Status: DC

## 2023-03-12 MED ORDER — IPRATROPIUM-ALBUTEROL 0.5-2.5 (3) MG/3ML IN SOLN: 3.0000 mL | RESPIRATORY_TRACT | Status: DC | PRN

## 2023-03-12 MED ORDER — MOMETASONE FURO-FORMOTEROL FUM 200-5 MCG/ACT IN AERO
2.0000 | INHALATION_SPRAY | Freq: Two times a day (BID) | RESPIRATORY_TRACT | Status: DC
  Administered 2023-03-13 – 2023-03-18 (×8): 2 via RESPIRATORY_TRACT
  Filled 2023-03-12 (×2): qty 8.8

## 2023-03-12 MED ORDER — ATORVASTATIN CALCIUM 40 MG PO TABS
40.0000 mg | ORAL_TABLET | Freq: Every morning | ORAL | Status: DC
  Administered 2023-03-13 – 2023-03-18 (×6): 40 mg via ORAL
  Filled 2023-03-12 (×6): qty 1

## 2023-03-12 MED ORDER — NOREPINEPHRINE 4 MG/250ML-% IV SOLN
2.0000 ug/min | INTRAVENOUS | Status: DC
  Administered 2023-03-12: 2 ug/min via INTRAVENOUS
  Administered 2023-03-13: 3 ug/min via INTRAVENOUS
  Filled 2023-03-12: qty 250

## 2023-03-12 MED ORDER — LACTATED RINGERS IV BOLUS: 1000.0000 mL | Freq: Once | INTRAVENOUS | Status: DC

## 2023-03-12 MED ORDER — PANTOPRAZOLE SODIUM 40 MG PO TBEC
40.0000 mg | DELAYED_RELEASE_TABLET | Freq: Two times a day (BID) | ORAL | Status: DC
  Administered 2023-03-13 – 2023-03-18 (×11): 40 mg via ORAL
  Filled 2023-03-12 (×12): qty 1

## 2023-03-12 MED ORDER — SODIUM CHLORIDE 0.9 % IV SOLN: 250.0000 mL | INTRAVENOUS | Status: DC

## 2023-03-12 MED ORDER — MONTELUKAST SODIUM 10 MG PO TABS
10.0000 mg | ORAL_TABLET | Freq: Every day | ORAL | Status: DC
  Administered 2023-03-13 – 2023-03-17 (×5): 10 mg via ORAL
  Filled 2023-03-12 (×6): qty 1

## 2023-03-12 NOTE — H&P (Incomplete)
NAME:  ZENO MATERNA, MRN:  [id number], DOB:  [birth year], LOS: 0 ADMISSION DATE:  03/12/2023, CONSULTATION DATE:  10/11 REFERRING MD:  Dr. Suezanne Jacquet, CHIEF COMPLAINT:  hypoglycemia   History of Present Illness:  Patient is a 89 yo M w/ pertinent PMH aflutter w/ ICD in place, CAD s/p stent x2, chronic systolic chf (EF 25-36% w/ grade one diastolic dysfx), mild dementia, PAD s/p b/l common iliac stenting 2019 COPD, asthma, t2dm, hld, htn, hypothyroidism present to [facility] ED on 10/11 after lightheadedness/fall.  Patient last seen by cardiology on June 2024. BP borderline low but kept on metoprolol. Per family patient has poor appetite. Having several episodes of lightheadedness when he gets up. On 10/11 patient became dizzy when getting up from chair and fell on his right elbow. Denies any LOC. Patient daughter heard fall and ems transported to [facility] ED. On arrival BP 125/60 and afebrile. Given IV fluids. CBG 29 given dextrose and started on d10 drip. CT head no acute abnormality. CXR w/ small right pleural effusion. Elbow xray negative for acute fracture and gauze dressing placed over skin tear. Creat 2.81 (baseline 1.8). EKG w/ paced rhythm w/ RBBB. Cardiology consulted suspect related to LV pacing. Believe patient's dizziness is related to dehydration and orthostatic hypotension. Recommend transfer to [facility]. Patient remained hypoglycemic despite being on d10 drip and bp becoming more hypotensive w/ maps in 60s. PCCM consulted for icu admission.   Pertinent  Medical History   Past Medical History:  Diagnosis Date  . Atrial flutter (HCC)    (I could not find documentation of this rhythm.)  . AUTOMATIC IMPLANTABLE CARDIAC DEFIBRILLATOR SITU   . Automatic implantable cardioverter-defibrillator in situ 12/05/2009   Qualifier: Diagnosis of  By: Ladona Ridgel, MD, Jerrell Mylar   . CAD (coronary artery disease) 05/22/2011  . Carotid stenosis 05/22/2011  . CHF CONGESTIVE HEART FAILURE    45% by echo 2015  .  COPD 12/01/2007   Qualifier: Diagnosis of  By: Clent Ridges NP, [name]    . Dementia (HCC)    per pt's stepdaughter  . DM2 (diabetes mellitus, type 2) (HCC)    pt's stepdaughter said he is no longer Diabetic, and does not take medication for it  . DYSLIPIDEMIA   . DYSPNEA   . Edema   . Essential hypertension 11/17/2007   Qualifier: Diagnosis of  By: Vernie Murders    . GERD (gastroesophageal reflux disease)   . Gout   . HYPERTENSION   . HYPOTHYROIDISM   . MYOCARDIAL INFARCTION    MI 1999, stent x 2 to RCA, 70% circ, 30 and 40 % LADs  . S/P updgrade to CRT-D 11/23/2020  . WEIGHT GAIN, ABNORMAL      Significant Hospital Events: Including procedures, antibiotic start and stop dates in addition to other pertinent events   10/11 admitted after fall; found to be hypoglycemic; bp soft  Interim History / Subjective:  See above  Objective   Blood pressure (!) 106/57, pulse 75, temperature 98.5 F (36.9 C), temperature source Oral, resp. rate 18, SpO2 100%.        Intake/Output Summary (Last 24 hours) at 03/12/2023 2305 Last data filed at 03/12/2023 1918 Gross per 24 hour  Intake 33.14 ml  Output --  Net 33.14 ml   There were no vitals filed for this visit.  Examination: General:  elderly male in NAD HEENT: MM pink/moist Neuro: Aox3; MAE CV: s1s2, paced rhythm w/ rates 80s, no m/r/g PULM:  dim clear BS bilaterally  GI: soft, bsx4 active  Extremities: warm/dry, ble edema  Skin: no rashes or lesions    Resolved Hospital Problem list     Assessment & Plan:   Hypoglycemia T2DM Plan: -cont d10 drip -advance diet as tolerated -cbg monitoring  Hypotension -appears to have soft bp on last cardiology visit in June 2024 and remains on metoprolol; patient has had poor po intake; likely dehdration Plan: -will start on peripheral levo for sbp goal >90  -hold home anti-hypertensive's -  Dizziness Fall on right shoulder -likely related to hypoglycemia and orthostatic  hypotension from dehydration -CT head and shoulder xray negative Plan: -continue dressing on   Aflutter w/ ICD in place Chronic systolic chf HTN HLD CAD  Plan: -resume asa and statin -hold plavix for now -  AKI on CKD 3b Plan: -Trend BMP / urinary output -Replace electrolytes as indicated -Avoid nephrotoxic agents, ensure adequate renal perfusion   COPD  Asthma Plan: -cont dulera -prn duoneb  Hypothyroidism Plan: -tsh -synthroid  Mild dementia Plan: -supportive care   Best Practice (right click and "Reselect all SmartList Selections" daily)   Diet/type: full liquids  DVT prophylaxis: prophylactic heparin  GI prophylaxis: PPI Lines: N/A Foley:  N/A Code Status:  full code Last date of multidisciplinary goals of care discussion [10/11 attempted to reach daughter and wife over phone but no answer.]  Labs   CBC: Recent Labs  Lab 03/12/23 1606 03/12/23 1620  WBC 10.3  --   NEUTROABS 8.1*  --   HGB 9.9* 10.2*  HCT 31.6* 30.0*  MCV 105.3*  --   PLT 269  --     Basic Metabolic Panel: Recent Labs  Lab 03/12/23 1606 03/12/23 1620  NA 134* 138  K 4.2 4.5  CL 100 102  CO2 24  --   GLUCOSE 29* 25*  BUN 73* 67*  CREATININE 2.81* 3.10*  CALCIUM 9.0  --   MG 1.8  --    GFR: CrCl cannot be calculated (Unknown ideal weight.). Recent Labs  Lab 03/12/23 1606 03/12/23 1622 03/12/23 1853  WBC 10.3  --   --   LATICACIDVEN  --  1.0 1.3    Liver Function Tests: Recent Labs  Lab 03/12/23 1606  AST 23  ALT 11  ALKPHOS 46  BILITOT 0.6  PROT 9.1*  ALBUMIN 3.6   No results for input(s): "LIPASE", "AMYLASE" in the last 168 hours. No results for input(s): "AMMONIA" in the last 168 hours.  ABG    Component Value Date/Time   TCO2 25 03/12/2023 1620     Coagulation Profile: Recent Labs  Lab 03/12/23 1606  INR 1.3*    Cardiac Enzymes: No results for input(s): "CKTOTAL", "CKMB", "CKMBINDEX", "TROPONINI" in the last 168  hours.  HbA1C: No results found for: "HGBA1C"  CBG: Recent Labs  Lab 03/12/23 1955 03/12/23 2108 03/12/23 2144 03/12/23 2206 03/12/23 2247  GLUCAP 114* 71 59* 100* 80    Review of Systems:   ***  Past Medical History:  He,  has a past medical history of Atrial flutter (HCC), AUTOMATIC IMPLANTABLE CARDIAC DEFIBRILLATOR SITU, Automatic implantable cardioverter-defibrillator in situ (12/05/2009), CAD (coronary artery disease) (05/22/2011), Carotid stenosis (05/22/2011), CHF CONGESTIVE HEART FAILURE, COPD (12/01/2007), Dementia (HCC), DM2 (diabetes mellitus, type 2) (HCC), DYSLIPIDEMIA, DYSPNEA, Edema, Essential hypertension (11/17/2007), GERD (gastroesophageal reflux disease), Gout, HYPERTENSION, HYPOTHYROIDISM, MYOCARDIAL INFARCTION, S/P updgrade to CRT-D (11/23/2020), and WEIGHT GAIN, ABNORMAL.   Surgical History:   Past Surgical History:  Procedure Laterality Date  . ANGIOPLASTY  stent  . APPENDECTOMY  1957  . BIV UPGRADE N/A 11/22/2020   Procedure: BIV ICD UPGRADE;  Surgeon: Marinus Maw, MD;  [facility]VAS[facility] LAB;  Service: Cardiovascular;  Laterality: N/A;  . CARDIAC DEFIBRILLATOR PLACEMENT    . CAROTID STENT    . EYE SURGERY  1985  . INSERTION OF ILIAC STENT N/A 12/11/2021   Procedure: LOWER EXTREMITY ANGIOGRAM WITH COMMON FEMORAL ARTERY AND PROFUNDA  ARTERY STENTING;  Surgeon: Leonie Douglas, MD;  Location: [facility] OR;  Service: Vascular;  Laterality: N/A;  . LOWER EXTREMITY ANGIOGRAPHY N/A 05/04/2018   Procedure: LOWER EXTREMITY ANGIOGRAPHY;  Surgeon: Cephus Shelling, MD;  Location: [facility] INVASIVE CV LAB;  Service: Cardiovascular;  Laterality: N/A;  . PERIPHERAL VASCULAR INTERVENTION Bilateral 05/04/2018   Procedure: PERIPHERAL VASCULAR INTERVENTION;  Surgeon: Cephus Shelling, MD;  Location: [facility] INVASIVE CV LAB;  Service: Cardiovascular;  Laterality: Bilateral;  [place]  . ULTRASOUND GUIDANCE FOR VASCULAR ACCESS Left 12/11/2021   Procedure: ULTRASOUND  GUIDANCE FOR VASCULAR ACCESS;  Surgeon: Leonie Douglas, MD;  Location: [facility] OR;  Service: Vascular;  Laterality: Left;     Social History:   reports that he quit smoking about 39 years ago. His smoking use included cigarettes. He has never used smokeless tobacco. He reports that he does not drink alcohol and does not use drugs.   Family History:  His family history includes Cancer in his brother; Kidney disease in his father; Other in his mother. There is no history of Stomach cancer, Colon cancer, or Esophageal cancer.   Allergies Allergies  Allergen Reactions  . Tape Other (See Comments)    SKIN IS VERY DELICATE AND WILL TEAR EASILY!!!!  . Prednisone Rash  . Sulfa Antibiotics Other (See Comments)    "Hyper"     Home Medications  Prior to Admission medications   Medication Sig Start Date End Date Taking? Authorizing Provider  aspirin EC 81 MG tablet Take 1 tablet (81 mg total) by mouth in the morning. Swallow whole. 06/15/22  Yes Rolly Salter, MD  atorvastatin (LIPITOR) 40 MG tablet TAKE 1 TABLET (40 MG TOTAL) BY MOUTH IN THE MORNING 11/23/22  Yes [name], MD  clopidogrel (PLAVIX) 75 MG tablet TAKE 1 TABLET (75 MG TOTAL) BY MOUTH IN THE MORNING 03/10/23  Yes Etta Grandchild, MD  docusate sodium (COLACE) 100 MG capsule Take 1 capsule (100 mg total) by mouth 2 (two) times daily as needed for mild constipation. 06/13/22  Yes Rolly Salter, MD  famotidine (PEPCID) 40 MG tablet Take 40 mg by mouth in the morning.   Yes [provider]  folic acid (FOLVITE) 1 MG tablet TAKE 1 TABLET BY MOUTH EVERY DAY Patient taking differently: Take 1 mg by mouth at bedtime. 02/26/23  Yes Etta Grandchild, MD  furosemide (LASIX) 20 MG tablet TAKE 1 TABLET (20 MG TOTAL) BY MOUTH IN THE MORNING Patient taking differently: Take 20 mg by mouth at bedtime. 12/24/22 12/19/23 Yes Etta Grandchild, MD  levothyroxine (SYNTHROID) 125 MCG tablet TAKE 1 TABLET BY MOUTH DAILY BEFORE BREAKFAST. 12/24/22  Yes  Etta Grandchild, MD  metoprolol succinate (TOPROL-XL) 50 MG 24 hr tablet TAKE 1 TABLET (50 MG TOTAL) BY MOUTH TWICE A DAY .TAKE WITH OR IMMEDIATELY FOLLOWING A MEAL Patient taking differently: Take 50 mg by mouth 2 (two) times daily after a meal. 02/16/23  Yes [name], MD  mirtazapine (REMERON) 30 MG tablet TAKE 1 TABLET BY MOUTH EVERY DAY  Patient taking differently: Take 30 mg by mouth at bedtime. 01/08/23  Yes Etta Grandchild, MD  montelukast (SINGULAIR) 10 MG tablet TAKE 1 TABLET BY MOUTH EVERY DAY Patient taking differently: Take 10 mg by mouth at bedtime. 01/08/23  Yes Etta Grandchild, MD  Multiple Vitamin (MULTIVITAMIN WITH MINERALS) TABS tablet Take 1 tablet by mouth daily. 06/14/22  Yes Rolly Salter, MD  pantoprazole (PROTONIX) 40 MG tablet TAKE 1 TABLET (40 MG TOTAL) BY MOUTH IN THE MORNING AND AT BEDTIME Patient taking differently: Take 40 mg by mouth in the morning and at bedtime. 12/24/22  Yes Etta Grandchild, MD  REFRESH OPTIVE PF 0.5-0.9 % SOLN Place 1 drop into both eyes 3 (three) times daily as needed (for dryness).   Yes [provider]  TUMS E-X 750 750 MG chewable tablet Chew 1 tablet by mouth 4 (four) times daily - after meals and at bedtime.   Yes [provider]  TYLENOL 500 MG tablet Take 500 mg by mouth every 6 (six) hours as needed for mild pain or headache.   Yes [provider]  Vaseline Petrolatum Gauze (VASELINE GAUZE) Apply topically daily. Patient taking differently: Apply 1 each topically See admin instructions. Apply to wound sites on both arms, after cleansing/drying, every other day 06/13/22  Yes Rolly Salter, MD  Monte Fantasia INHUB 250-50 MCG/ACT AEPB Inhale 1 puff into the lungs in the morning and at bedtime.   Yes [provider]  zinc gluconate 50 MG tablet Take 1 tablet (50 mg total) by mouth daily. 09/22/22  Yes Etta Grandchild, MD  levothyroxine (UNITHROID) 150 MCG tablet Take 1 tablet (150 mcg total) by mouth daily before  breakfast. Patient not taking: Reported on 03/12/2023 12/22/22   Etta Grandchild, MD     Critical care time: 45 minutes    JD Daryel [facility] 03/12/2023, 11:05 PM  Please see [email or web address] for pager details.  From 7A-7P if no response, please call [phone]. After hours, please call ELink [phone].

## 2023-03-12 NOTE — H&P (Addendum)
NAME:  [name], MRN:  [id number], DOB:  1934-04-12, LOS: 0 ADMISSION DATE:  03/12/2023, CONSULTATION DATE:  10/11 REFERRING MD:  Dr. Suezanne Jacquet, CHIEF COMPLAINT:  hypoglycemia   History of Present Illness:  Patient is a 89 yo M w/ pertinent PMH aflutter w/ ICD in place, CAD s/p stent x2, chronic systolic chf (EF 40-98% w/ grade one diastolic dysfx), mild dementia, PAD s/p b/l common iliac stenting 2019 COPD, asthma, t2dm, hld, htn, hypothyroidism present to [facility] ED on 10/11 after lightheadedness/fall.  Patient last seen by cardiology on June 2024. BP borderline low but kept on metoprolol. Per family patient has poor appetite. Having several episodes of lightheadedness when he gets up. On 10/11 patient became dizzy when getting up from chair and fell on his right elbow. Denies any LOC. Patient daughter heard fall and ems transported to [facility] ED. On arrival BP 125/60 and afebrile. Given IV fluids. CBG 29 given dextrose and started on d10 drip. CT head no acute abnormality. CXR w/ small right pleural effusion. Elbow xray negative for acute fracture and gauze dressing placed over skin tear. Creat 2.81 (baseline 1.8). EKG w/ paced rhythm w/ RBBB. Cardiology consulted suspect related to LV pacing. Believe patient's dizziness is related to dehydration and orthostatic hypotension. Recommend transfer to [facility]. Patient remained hypoglycemic despite being on d10 drip and bp becoming more hypotensive w/ maps in 60s. PCCM consulted for icu admission.   Pertinent  Medical History   Past Medical History:  Diagnosis Date   Atrial flutter (HCC)    (I could not find documentation of this rhythm.)   AUTOMATIC IMPLANTABLE CARDIAC DEFIBRILLATOR SITU    Automatic implantable cardioverter-defibrillator in situ 12/05/2009   Qualifier: Diagnosis of  By: Ladona Ridgel, MD, [facility], Vergia Alcon    CAD (coronary artery disease) 05/22/2011   Carotid stenosis 05/22/2011   CHF CONGESTIVE HEART FAILURE    45% by echo 2015   COPD  12/01/2007   Qualifier: Diagnosis of  By: Clent Ridges NP, [name]     Dementia (HCC)    per pt's stepdaughter   DM2 (diabetes mellitus, type 2) (HCC)    pt's stepdaughter said he is no longer Diabetic, and does not take medication for it   DYSLIPIDEMIA    DYSPNEA    Edema    Essential hypertension 11/17/2007   Qualifier: Diagnosis of  By: Vernie Murders     GERD (gastroesophageal reflux disease)    Gout    HYPERTENSION    HYPOTHYROIDISM    MYOCARDIAL INFARCTION    MI 1999, stent x 2 to RCA, 70% circ, 30 and 40 % LADs   S/P updgrade to CRT-D 11/23/2020   WEIGHT GAIN, ABNORMAL      Significant Hospital Events: Including procedures, antibiotic start and stop dates in addition to other pertinent events   10/11 admitted after fall; found to be hypoglycemic; bp soft  Interim History / Subjective:  See above  Objective   Blood pressure (!) 106/57, pulse 75, temperature 98.5 F (36.9 C), temperature source Oral, resp. rate 18, SpO2 100%.        Intake/Output Summary (Last 24 hours) at 03/12/2023 2305 Last data filed at 03/12/2023 1918 Gross per 24 hour  Intake 33.14 ml  Output --  Net 33.14 ml   There were no vitals filed for this visit.  Examination: General:  elderly male in NAD HEENT: MM pink/moist Neuro: Aox3; MAE CV: s1s2, paced rhythm w/ rates 80s, no m/r/g PULM:  dim clear BS bilaterally  GI: soft, bsx4 active  Extremities: warm/dry, ble edema  Skin: no rashes or lesions    Resolved Hospital Problem list     Assessment & Plan:   Hypoglycemia T2DM Plan: -cont d10 drip -advance diet as tolerated -cbg monitoring -a1c  Hypotension -appears to have soft bp on last cardiology visit in June 2024 and remains on metoprolol; patient has had poor po intake; likely dehydration Plan: -will start on peripheral levo for sbp goal >90  -hold home anti-hypertensive's -will give gentle iv fluids -no evidence of infection on cxr and ua; will check pct if elevated  consider cultures and starting abx  Dizziness -likely related to hypoglycemia and hypotension likely orthostatic from dehydration -CT head negative Plan: -treatment as above  Fall on right shoulder -shoulder xray negative Plan: -continue dressing on right shoulder  Aflutter w/ ICD in place Chronic systolic chf HTN HLD CAD  Plan: -cards following; appreciate recs -resume statin -hold plavix and asa for now -daily weights; strict I/o's  AKI on CKD 3b Plan: -gentle fluids -Trend BMP / urinary output -Replace electrolytes as indicated -Avoid nephrotoxic agents, ensure adequate renal perfusion  COPD  Asthma Plan: -cont dulera -prn duoneb  Hypothyroidism Plan: -tsh -synthroid  Mild dementia Plan: -supportive care  GERD Plan: -PPI   Best Practice (right click and "Reselect all SmartList Selections" daily)   Diet/type: full liquids  DVT prophylaxis: prophylactic heparin  GI prophylaxis: PPI Lines: N/A Foley:  N/A Code Status:  full code Last date of multidisciplinary goals of care discussion [10/11 attempted to reach daughter and wife over phone but no answer. Patient has been a DNR before on last admission 06/2022. Patient states he is unsure if he is DNR but to do everything for now. Will need to revisit conversation w/ family.]  Labs   CBC: Recent Labs  Lab 03/12/23 1606 03/12/23 1620  WBC 10.3  --   NEUTROABS 8.1*  --   HGB 9.9* 10.2*  HCT 31.6* 30.0*  MCV 105.3*  --   PLT 269  --     Basic Metabolic Panel: Recent Labs  Lab 03/12/23 1606 03/12/23 1620  NA 134* 138  K 4.2 4.5  CL 100 102  CO2 24  --   GLUCOSE 29* 25*  BUN 73* 67*  CREATININE 2.81* 3.10*  CALCIUM 9.0  --   MG 1.8  --    GFR: CrCl cannot be calculated (Unknown ideal weight.). Recent Labs  Lab 03/12/23 1606 03/12/23 1622 03/12/23 1853  WBC 10.3  --   --   LATICACIDVEN  --  1.0 1.3    Liver Function Tests: Recent Labs  Lab 03/12/23 1606  AST 23  ALT 11   ALKPHOS 46  BILITOT 0.6  PROT 9.1*  ALBUMIN 3.6   No results for input(s): "LIPASE", "AMYLASE" in the last 168 hours. No results for input(s): "AMMONIA" in the last 168 hours.  ABG    Component Value Date/Time   TCO2 25 03/12/2023 1620     Coagulation Profile: Recent Labs  Lab 03/12/23 1606  INR 1.3*    Cardiac Enzymes: No results for input(s): "CKTOTAL", "CKMB", "CKMBINDEX", "TROPONINI" in the last 168 hours.  HbA1C: No results found for: "HGBA1C"  CBG: Recent Labs  Lab 03/12/23 1955 03/12/23 2108 03/12/23 2144 03/12/23 2206 03/12/23 2247  GLUCAP 114* 71 59* 100* 80    Review of Systems:   Review of Systems  Constitutional:  Negative for fever.  Respiratory:  Negative for shortness of breath.  Cardiovascular:  Negative for chest pain.  Gastrointestinal:  Negative for abdominal pain, nausea and vomiting.  Neurological:  Positive for dizziness. Negative for loss of consciousness.     Past Medical History:  He,  has a past medical history of Atrial flutter (HCC), AUTOMATIC IMPLANTABLE CARDIAC DEFIBRILLATOR SITU, Automatic implantable cardioverter-defibrillator in situ (12/05/2009), CAD (coronary artery disease) (05/22/2011), Carotid stenosis (05/22/2011), CHF CONGESTIVE HEART FAILURE, COPD (12/01/2007), Dementia (HCC), DM2 (diabetes mellitus, type 2) (HCC), DYSLIPIDEMIA, DYSPNEA, Edema, Essential hypertension (11/17/2007), GERD (gastroesophageal reflux disease), Gout, HYPERTENSION, HYPOTHYROIDISM, MYOCARDIAL INFARCTION, S/P updgrade to CRT-D (11/23/2020), and WEIGHT GAIN, ABNORMAL.   Surgical History:   Past Surgical History:  Procedure Laterality Date   ANGIOPLASTY     stent   APPENDECTOMY  1957   BIV UPGRADE N/A 11/22/2020   Procedure: BIV ICD UPGRADE;  Surgeon: Marinus Maw, MD;  Location: [facility] INVASIVE CV LAB;  Service: Cardiovascular;  Laterality: N/A;   CARDIAC DEFIBRILLATOR PLACEMENT     CAROTID STENT     EYE SURGERY  1985   INSERTION OF ILIAC  STENT N/A 12/11/2021   Procedure: LOWER EXTREMITY ANGIOGRAM WITH COMMON FEMORAL ARTERY AND PROFUNDA  ARTERY STENTING;  Surgeon: Leonie Douglas, MD;  Location: [facility] OR;  Service: Vascular;  Laterality: N/A;   LOWER EXTREMITY ANGIOGRAPHY N/A 05/04/2018   Procedure: LOWER EXTREMITY ANGIOGRAPHY;  Surgeon: Cephus Shelling, MD;  Location: [facility] INVASIVE CV LAB;  Service: Cardiovascular;  Laterality: N/A;   PERIPHERAL VASCULAR INTERVENTION Bilateral 05/04/2018   Procedure: PERIPHERAL VASCULAR INTERVENTION;  Surgeon: Cephus Shelling, MD;  Location: [facility] INVASIVE CV LAB;  Service: Cardiovascular;  Laterality: Bilateral;  Iliacs   ULTRASOUND GUIDANCE FOR VASCULAR ACCESS Left 12/11/2021   Procedure: ULTRASOUND GUIDANCE FOR VASCULAR ACCESS;  Surgeon: Leonie Douglas, MD;  Location: [facility] OR;  Service: Vascular;  Laterality: Left;     Social History:   reports that he quit smoking about 39 years ago. His smoking use included cigarettes. He has never used smokeless tobacco. He reports that he does not drink alcohol and does not use drugs.   Family History:  His family history includes Cancer in his brother; Kidney disease in his father; Other in his mother. There is no history of Stomach cancer, Colon cancer, or Esophageal cancer.   Allergies Allergies  Allergen Reactions   Tape Other (See Comments)    SKIN IS VERY DELICATE AND WILL TEAR EASILY!!!!   Prednisone Rash   Sulfa Antibiotics Other (See Comments)    "Hyper"     Home Medications  Prior to Admission medications   Medication Sig Start Date End Date Taking? Authorizing Provider  aspirin EC 81 MG tablet Take 1 tablet (81 mg total) by mouth in the morning. Swallow whole. 06/15/22  Yes Rolly Salter, MD  atorvastatin (LIPITOR) 40 MG tablet TAKE 1 TABLET (40 MG TOTAL) BY MOUTH IN THE MORNING 11/23/22  Yes [name], MD  clopidogrel (PLAVIX) 75 MG tablet TAKE 1 TABLET (75 MG TOTAL) BY MOUTH IN THE MORNING 03/10/23  Yes Etta Grandchild, MD   docusate sodium (COLACE) 100 MG capsule Take 1 capsule (100 mg total) by mouth 2 (two) times daily as needed for mild constipation. 06/13/22  Yes Rolly Salter, MD  famotidine (PEPCID) 40 MG tablet Take 40 mg by mouth in the morning.   Yes [provider]  folic acid (FOLVITE) 1 MG tablet TAKE 1 TABLET BY MOUTH EVERY DAY Patient taking differently: Take 1 mg by mouth at bedtime.  02/26/23  Yes Etta Grandchild, MD  furosemide (LASIX) 20 MG tablet TAKE 1 TABLET (20 MG TOTAL) BY MOUTH IN THE MORNING Patient taking differently: Take 20 mg by mouth at bedtime. 12/24/22 12/19/23 Yes Etta Grandchild, MD  levothyroxine (SYNTHROID) 125 MCG tablet TAKE 1 TABLET BY MOUTH DAILY BEFORE BREAKFAST. 12/24/22  Yes Etta Grandchild, MD  metoprolol succinate (TOPROL-XL) 50 MG 24 hr tablet TAKE 1 TABLET (50 MG TOTAL) BY MOUTH TWICE A DAY .TAKE WITH OR IMMEDIATELY FOLLOWING A MEAL Patient taking differently: Take 50 mg by mouth 2 (two) times daily after a meal. 02/16/23  Yes Hochrein, Fayrene Fearing, MD  mirtazapine (REMERON) 30 MG tablet TAKE 1 TABLET BY MOUTH EVERY DAY Patient taking differently: Take 30 mg by mouth at bedtime. 01/08/23  Yes Etta Grandchild, MD  montelukast (SINGULAIR) 10 MG tablet TAKE 1 TABLET BY MOUTH EVERY DAY Patient taking differently: Take 10 mg by mouth at bedtime. 01/08/23  Yes Etta Grandchild, MD  Multiple Vitamin (MULTIVITAMIN WITH MINERALS) TABS tablet Take 1 tablet by mouth daily. 06/14/22  Yes Rolly Salter, MD  pantoprazole (PROTONIX) 40 MG tablet TAKE 1 TABLET (40 MG TOTAL) BY MOUTH IN THE MORNING AND AT BEDTIME Patient taking differently: Take 40 mg by mouth in the morning and at bedtime. 12/24/22  Yes Etta Grandchild, MD  REFRESH OPTIVE PF 0.5-0.9 % SOLN Place 1 drop into both eyes 3 (three) times daily as needed (for dryness).   Yes [provider]  TUMS E-X 750 750 MG chewable tablet Chew 1 tablet by mouth 4 (four) times daily - after meals and at bedtime.   Yes [provider]  TYLENOL 500 MG tablet Take 500 mg by mouth every 6 (six) hours as needed for mild pain or headache.   Yes [provider]  Vaseline Petrolatum Gauze (VASELINE GAUZE) Apply topically daily. Patient taking differently: Apply 1 each topically See admin instructions. Apply to wound sites on both arms, after cleansing/drying, every other day 06/13/22  Yes Rolly Salter, MD  Monte Fantasia INHUB 250-50 MCG/ACT AEPB Inhale 1 puff into the lungs in the morning and at bedtime.   Yes [provider]  zinc gluconate 50 MG tablet Take 1 tablet (50 mg total) by mouth daily. 09/22/22  Yes Etta Grandchild, MD  levothyroxine (UNITHROID) 150 MCG tablet Take 1 tablet (150 mcg total) by mouth daily before breakfast. Patient not taking: Reported on 03/12/2023 12/22/22   Etta Grandchild, MD     Critical care time: 45 minutes    JD Daryel [facility] 03/12/2023, 11:05 PM  Please see [email or web address] for pager details.  From 7A-7P if no response, please call [phone]. After hours, please call ELink [phone].

## 2023-03-12 NOTE — ED Notes (Signed)
Messaged Dr. Aron Baba stating that interrogation had been faxed. We would be sending over shortly

## 2023-03-12 NOTE — ED Notes (Signed)
Glucose 333

## 2023-03-12 NOTE — Consult Note (Signed)
Cardiology Consultation   Patient ID: [name] MRN: [id number]; DOB: March 09, 1934  Admit date: 03/12/2023 Date of Consult: 03/12/2023  PCP:  Etta Grandchild, MD    [facility] Providers Cardiologist:  [name], MD  Electrophysiologist:  Lewayne Bunting, MD       Patient Profile:   [name] is a 89 y.o. male with a hx of CAD, ischemic cardiomyopathy s/p [employer] biventricular ICD (RV lead turned off, pacing from LV lead only), hypertension, hyperlipidemia, DM2, COPD, PAD s/p bilateral common iliac stenting 2019 and dementia who is being seen 03/12/2023 for the evaluation of wide complex rhythm on EKG at the request of Dr. Suezanne Jacquet.  History of Present Illness:   [name] is a 89 year old veteran with past medical history of CAD, ischemic cardiomyopathy s/p [employer] biventricular ICD (RV lead turned off, pacing from LV lead only), hypertension, hyperlipidemia, DM2, COPD, PAD s/p bilateral common iliac stenting 2019 and dementia.  He has lower extremity PAD issue is being followed by vascular surgery, last intervention was in 2019.  Based on previous serial echocardiogram, EF has been as low as 20 to 25% dating back to 2009.  Although ejection fraction improved to 40 to 45% on echocardiogram in November 2015, however repeat echocardiogram in 2018 and 2019 showed EF has came back down to 25%.  Last echocardiogram obtained on 03/08/2018 showed EF 25 to 30%, severe diffuse hypokinesis with distinct regional wall motion abnormality, akinesis of the basal inferior septal myocardium, akinesis of the inferior myocardium, grade 1 DD, moderate Paradox in the ventricular septum consistent with intraventricular conduction delay, trivial MR.  He had 2 ICD shocks was his first single-chamber ICD in the past, around June 2022, he was upgraded to [employer] biventricular ICD.  He has chronically elevated RV pacing threshold, patient was last seen by Dr.  Ladona Ridgel in January 2024, his device was reprogrammed to be LV paced only.  He was last seen by Dr. Antoine Poche in June 2024 at which time he was doing okay.  His blood pressure has been borderline low.  He was kept on metoprolol succinate, however unable to tolerate any further up titration of heart failure regimen.  According to family, he has not been eating very well due to lack of appetite.  Recently, there has been several episodes, where he get up he would feel very dizzy.  This happened this afternoon while he tried to get up from the dining chair and became very dizzy and fell onto his right elbow.  He denies loss of consciousness.  Daughter was sitting in the next room and heard his fall.  When the patient was transported to [facility], ED, blood pressure was low in the triage.  Initial significant blood work included sodium 134, creatinine 2.81, baseline creatinine 1.8.  Hemoglobin 9.9.  Blood glucose 29, patient was treated with IV fluid and D50.  Initial EKG showed right bundle branch block, ST depression in anterior leads.  Patient denies any chest pain. Code STEMI was initially called however canceled upon review of EKG.  Patient was transferred urgently to [facility], ED.  On arrival at [facility], ED, blood glucose has normalized.  Patient is also feeling better after IV hydration.  Chest x-ray showed small right pleural effusion and central pulmonary vascular congestion.  Elbow x-ray was negative for acute fracture.  Family wish to keep the patient at FULL CODE  Past Medical History:  Diagnosis Date   Atrial flutter (HCC)    (  I could not find documentation of this rhythm.)   AUTOMATIC IMPLANTABLE CARDIAC DEFIBRILLATOR SITU    Automatic implantable cardioverter-defibrillator in situ 12/05/2009   Qualifier: Diagnosis of  By: Ladona Ridgel, MD, [facility], [place]    CAD (coronary artery disease) 05/22/2011   Carotid stenosis 05/22/2011   CHF CONGESTIVE HEART FAILURE    45% by echo 2015   COPD  12/01/2007   Qualifier: Diagnosis of  By: Clent Ridges NP, [name]     Dementia (HCC)    per pt's stepdaughter   DM2 (diabetes mellitus, type 2) (HCC)    pt's stepdaughter said he is no longer Diabetic, and does not take medication for it   DYSLIPIDEMIA    DYSPNEA    Edema    Essential hypertension 11/17/2007   Qualifier: Diagnosis of  By: Vernie Murders     GERD (gastroesophageal reflux disease)    Gout    HYPERTENSION    HYPOTHYROIDISM    MYOCARDIAL INFARCTION    MI 1999, stent x 2 to RCA, 70% circ, 30 and 40 % LADs   S/P updgrade to CRT-D 11/23/2020   WEIGHT GAIN, ABNORMAL     Past Surgical History:  Procedure Laterality Date   ANGIOPLASTY     stent   APPENDECTOMY  1957   BIV UPGRADE N/A 11/22/2020   Procedure: BIV ICD UPGRADE;  Surgeon: Marinus Maw, MD;  Location: [facility];  Service: Cardiovascular;  Laterality: N/A;   CARDIAC DEFIBRILLATOR PLACEMENT     CAROTID STENT     EYE SURGERY  1985   INSERTION OF ILIAC STENT N/A 12/11/2021   Procedure: LOWER EXTREMITY ANGIOGRAM WITH COMMON FEMORAL ARTERY AND PROFUNDA  ARTERY STENTING;  Surgeon: Leonie Douglas, MD;  Location: [facility] OR;  Service: Vascular;  Laterality: N/A;   LOWER EXTREMITY ANGIOGRAPHY N/A 05/04/2018   Procedure: LOWER EXTREMITY ANGIOGRAPHY;  Surgeon: Cephus Shelling, MD;  Location: [facility] INVASIVE CV LAB;  Service: Cardiovascular;  Laterality: N/A;   PERIPHERAL VASCULAR INTERVENTION Bilateral 05/04/2018   Procedure: PERIPHERAL VASCULAR INTERVENTION;  Surgeon: Cephus Shelling, MD;  Location: [facility] CV [facility];  Service: Cardiovascular;  Laterality: Bilateral;  Iliacs   ULTRASOUND GUIDANCE FOR VASCULAR ACCESS Left 12/11/2021   Procedure: ULTRASOUND GUIDANCE FOR VASCULAR ACCESS;  Surgeon: Leonie Douglas, MD;  Location: [facility] OR;  Service: Vascular;  Laterality: Left;     Home Medications:  Prior to Admission medications   Medication Sig Start Date End Date Taking? Authorizing Provider  aspirin EC 81 MG  tablet Take 1 tablet (81 mg total) by mouth in the morning. Swallow whole. 06/15/22  Yes Rolly Salter, MD  atorvastatin (LIPITOR) 40 MG tablet TAKE 1 TABLET (40 MG TOTAL) BY MOUTH IN THE MORNING 11/23/22  Yes [name], MD  clopidogrel (PLAVIX) 75 MG tablet TAKE 1 TABLET (75 MG TOTAL) BY MOUTH IN THE MORNING 03/10/23  Yes Etta Grandchild, MD  docusate sodium (COLACE) 100 MG capsule Take 1 capsule (100 mg total) by mouth 2 (two) times daily as needed for mild constipation. 06/13/22  Yes Rolly Salter, MD  famotidine (PEPCID) 40 MG tablet Take 40 mg by mouth in the morning.   Yes [provider]  folic acid (FOLVITE) 1 MG tablet TAKE 1 TABLET BY MOUTH EVERY DAY Patient taking differently: Take 1 mg by mouth at bedtime. 02/26/23  Yes Etta Grandchild, MD  furosemide (LASIX) 20 MG tablet TAKE 1 TABLET (20 MG TOTAL) BY MOUTH IN THE MORNING Patient  taking differently: Take 20 mg by mouth at bedtime. 12/24/22 12/19/23 Yes Etta Grandchild, MD  levothyroxine (SYNTHROID) 125 MCG tablet TAKE 1 TABLET BY MOUTH DAILY BEFORE BREAKFAST. 12/24/22  Yes Etta Grandchild, MD  metoprolol succinate (TOPROL-XL) 50 MG 24 hr tablet TAKE 1 TABLET (50 MG TOTAL) BY MOUTH TWICE A DAY .TAKE WITH OR IMMEDIATELY FOLLOWING A MEAL Patient taking differently: Take 50 mg by mouth 2 (two) times daily after a meal. 02/16/23  Yes Hochrein, Fayrene Fearing, MD  mirtazapine (REMERON) 30 MG tablet TAKE 1 TABLET BY MOUTH EVERY DAY Patient taking differently: Take 30 mg by mouth at bedtime. 01/08/23  Yes Etta Grandchild, MD  montelukast (SINGULAIR) 10 MG tablet TAKE 1 TABLET BY MOUTH EVERY DAY Patient taking differently: Take 10 mg by mouth at bedtime. 01/08/23  Yes Etta Grandchild, MD  Multiple Vitamin (MULTIVITAMIN WITH MINERALS) TABS tablet Take 1 tablet by mouth daily. 06/14/22  Yes Rolly Salter, MD  pantoprazole (PROTONIX) 40 MG tablet TAKE 1 TABLET (40 MG TOTAL) BY MOUTH IN THE MORNING AND AT BEDTIME Patient taking differently: Take 40  mg by mouth in the morning and at bedtime. 12/24/22  Yes Etta Grandchild, MD  REFRESH OPTIVE PF 0.5-0.9 % SOLN Place 1 drop into both eyes 3 (three) times daily as needed (for dryness).   Yes [provider]  TUMS E-X 750 750 MG chewable tablet Chew 1 tablet by mouth 4 (four) times daily - after meals and at bedtime.   Yes [provider]  TYLENOL 500 MG tablet Take 500 mg by mouth every 6 (six) hours as needed for mild pain or headache.   Yes [provider]  Vaseline Petrolatum Gauze (VASELINE GAUZE) Apply topically daily. Patient taking differently: Apply 1 each topically See admin instructions. Apply to wound sites on both arms, after cleansing/drying, every other day 06/13/22  Yes Rolly Salter, MD  Monte Fantasia INHUB 250-50 MCG/ACT AEPB Inhale 1 puff into the lungs in the morning and at bedtime.   Yes [provider]  zinc gluconate 50 MG tablet Take 1 tablet (50 mg total) by mouth daily. 09/22/22  Yes Etta Grandchild, MD  levothyroxine (UNITHROID) 150 MCG tablet Take 1 tablet (150 mcg total) by mouth daily before breakfast. Patient not taking: Reported on 03/12/2023 12/22/22   Etta Grandchild, MD    Inpatient Medications: Scheduled Meds:  Continuous Infusions:  dextrose 100 mL/hr at 03/12/23 1708   PRN Meds:   Allergies:    Allergies  Allergen Reactions   Tape Other (See Comments)    SKIN IS VERY DELICATE AND WILL TEAR EASILY!!!!   Prednisone Rash   Sulfa Antibiotics Other (See Comments)    "Hyper"    Social History:   Social History   Socioeconomic History   Marital status: Married    Spouse name: Ila   Number of children: 2   Years of education: Not on file   Highest education level: Not on file  Occupational History   Occupation: retired  Tobacco Use   Smoking status: Former    Current packs/day: 0.00    Types: Cigarettes    Quit date: 12/10/1983    Years since quitting: 39.2   Smokeless tobacco: Never   Tobacco comments:    quit  1985  Vaping Use   Vaping status: Never Used  Substance and Sexual Activity   Alcohol use: No   Drug use: No   Sexual activity: Not on file  Other Topics Concern   Not on file  Social History Narrative   1 stepdaughter, Elease Hashimoto. 1 son who is not around and not in contact with his father. Pt's wife has alzheimers   Social Determinants of Corporate investment banker Strain: Not on file  Food Insecurity: No Food Insecurity (06/15/2022)   Hunger Vital Sign    Worried About Running Out of Food in the Last Year: Never true    Ran Out of Food in the Last Year: Never true  Transportation Needs: No Transportation Needs (06/15/2022)   PRAPARE - Administrator, Civil Service (Medical): No    Lack of Transportation (Non-Medical): No  Physical Activity: Not on file  Stress: Not on file  Social Connections: Unknown (09/28/2021)   Received from [facility], [facility]   Social Network    Social Network: Not on file  Intimate Partner Violence: Not At Risk (06/11/2022)   Humiliation, Afraid, Rape, and Kick questionnaire    Fear of Current or Ex-Partner: No    Emotionally Abused: No    Physically Abused: No    Sexually Abused: No    Family History:    Family History  Problem Relation Age of Onset   Other Mother        natural causes   Kidney disease Father    Cancer Brother        throat cancer   Stomach cancer Neg Hx    Colon cancer Neg Hx    Esophageal cancer Neg Hx      ROS:  Please see the history of present illness.   All other ROS reviewed and negative.     Physical Exam/Data:   Vitals:   03/12/23 1510 03/12/23 1600 03/12/23 1715 03/12/23 1839  BP: (!) 99/52 125/60 (!) 123/52   Pulse: 81 80 81   Resp: 18 19 18    Temp: 97.7 F (36.5 C)     TempSrc: Oral     SpO2: 100% 100% 100% 100%   No intake or output data in the 24 hours ending 03/12/23 1901    12/22/2022    1:20 PM 11/24/2022    2:50 PM 09/22/2022   12:59 PM  Last 3 Weights  Weight (lbs) 151  lb 156 lb 3.2 oz 158 lb  Weight (kg) 68.493 kg 70.852 kg 71.668 kg     There is no height or weight on file to calculate BMI.  General:  Well nourished, well developed, in no acute distress HEENT: normal Neck: no JVD Vascular: No carotid bruits; Distal pulses 2+ bilaterally Cardiac:  normal S1, S2; RRR; no murmur  Lungs:  clear to auscultation bilaterally, no wheezing, rhonchi or rales  Abd: soft, nontender, no hepatomegaly  Ext: no edema Musculoskeletal:  No deformities, BUE and BLE strength normal and equal Skin: warm and dry  Neuro:  CNs 2-12 intact, no focal abnormalities noted Psych:  Normal affect   EKG:  The EKG was personally reviewed and demonstrates:  wide complex rhythm with RBBB Telemetry:  Telemetry was personally reviewed and demonstrates:  paced rhythm with RBBB  Relevant CV Studies:    Laboratory Data:  High Sensitivity Troponin:   Recent Labs  Lab 03/12/23 1606  TROPONINIHS 17     Chemistry Recent Labs  Lab 03/12/23 1606 03/12/23 1620  NA 134* 138  K 4.2 4.5  CL 100 102  CO2 24  --   GLUCOSE 29* 25*  BUN 73* 67*  CREATININE 2.81* 3.10*  CALCIUM 9.0  --   MG 1.8  --   GFRNONAA 21*  --   ANIONGAP 10  --     Recent Labs  Lab 03/12/23 1606  PROT 9.1*  ALBUMIN 3.6  AST 23  ALT 11  ALKPHOS 46  BILITOT 0.6   Lipids No results for input(s): "CHOL", "TRIG", "HDL", "LABVLDL", "LDLCALC", "CHOLHDL" in the last 168 hours.  Hematology Recent Labs  Lab 03/12/23 1606 03/12/23 1620  WBC 10.3  --   RBC 3.00*  --   HGB 9.9* 10.2*  HCT 31.6* 30.0*  MCV 105.3*  --   MCH 33.0  --   MCHC 31.3  --   RDW 21.1*  --   PLT 269  --    Thyroid No results for input(s): "TSH", "FREET4" in the last 168 hours.  BNPNo results for input(s): "BNP", "PROBNP" in the last 168 hours.  DDimer No results for input(s): "DDIMER" in the last 168 hours.   Radiology/Studies:  DG Humerus Right  Result Date: 03/12/2023 CLINICAL DATA:  Fall.  Dizziness.  Hypotension.  EXAM: RIGHT HUMERUS - 2+ VIEW COMPARISON:  Elbow radiographs from 12/09/2018 FINDINGS: No humeral fracture or malalignment at the glenohumeral joint. Incidental IV tubing in the left antecubital region. IMPRESSION: 1. No humeral fracture or malalignment at the glenohumeral joint. Electronically Signed   By: Gaylyn Rong M.D.   On: 03/12/2023 18:37   DG Chest Portable 1 View  Result Date: 03/12/2023 CLINICAL DATA:  Fall, dizziness.  Hypotension. EXAM: PORTABLE CHEST 1 VIEW COMPARISON:  Chest x-ray November 23, 2020. FINDINGS: Small right pleural effusion. No confluent consolidation. Central pulmonary vascular congestion. Similar cardiomediastinal silhouette. Patient rotation. Left subclavian approach cardiac rhythm maintenance device. No acute bony abnormality. IMPRESSION: Small right pleural effusion and central pulmonary vascular congestion. Electronically Signed   By: Feliberto Harts M.D.   On: 03/12/2023 18:33     Assessment and Plan:   Dehydration: Patient presented after falling out of her chair while trying to stand up.  Symptom concerning for orthostatic hypotension.  He has received IV fluid with improvement of symptoms.  Hold all blood pressure medication for now.  Monitor hydration status closely as the patient has a history of severe ischemic cardiomyopathy and may become volume overloaded with too much IV fluid.  Hypoglycemia: Improved after D50, IV dextrose and lactated Ringer  Acute on chronic renal insufficiency: Baseline creatinine 1.8, patient arrived with creatinine of 2.9.  Continue IV hydration  Paced rhythm with right bundle branch morphology: Based on EP note from January 2024, patient had high RV pacing threshold, therefore his biventricular ICD was reprogrammed to pace from LV lead only.  Suspect RBBB morphology is due to LV pacing.  Patient's dizziness spell appears to be orthostatic in nature and likely related to dehydration.  Pending device interrogation  CAD: Denies  any chest pain  Ischemic cardiomyopathy: Baseline EF 25 to 30%.  Hypertension: Hold heart failure medication metoprolol succinate.   Risk Assessment/Risk Scores:                For questions or updates, please contact [facility] Please consult [email or web address] for contact info under    Ramond Dial, PA  03/12/2023 7:01 PM

## 2023-03-12 NOTE — ED Triage Notes (Signed)
Pt brought in by daughter. States she found him laying on his back. Patient states he was getting up from dining room table, hit his elbow and fell. Denies LOC. Patient takes aspirin daily. States ambulance came and he refused transport. Reporting R arm pain. Patient BP low in triage. Denies dizziness, cp,shob.

## 2023-03-12 NOTE — ED Provider Notes (Signed)
[place] EMERGENCY DEPARTMENT AT [facility] Provider Note   CSN: [id number] Arrival date & time: 03/12/23  1501     History  Chief Complaint  Patient presents with   [name] is a 89 y.o. male with PMH as listed below who presents brought in by family for fall at home. Daughter and wife provide most of history. Patient has been dizzy/weak for 1-2 weeks at home. Increased fatigue, lightheadedness when he gets up. Poor PO intake as well. Patient fell when he got dizzy this morning and fell onto right elbow. Daughter heard fall from another room. Patient denies hitting his head, neck pain, chest pain, abd pain. He endorses pain in his right elbow. On arrival to ED, patient hypotensive into 80s systolic and EKG shows regular wide complex rhythm significantly different from prior EKGs. Patient is immediately transferred to room from triage. Per most recent cardiology note, LVEF 25%. Has ischemic cardiomyopathy s/p bos sci BV ICD. Code STEMI was called to cardiology; however, discussed with Dr. Clifton James who called off the STEMI but advised immediate transfer to [facility] for further evaluation by cards/EP. CBG 29 mg/dL via fingerstick.    Past Medical History:  Diagnosis Date   Atrial flutter (HCC)    (I could not find documentation of this rhythm.)   AUTOMATIC IMPLANTABLE CARDIAC DEFIBRILLATOR SITU    Automatic implantable cardioverter-defibrillator in situ 12/05/2009   Qualifier: Diagnosis of  By: Ladona Ridgel, MD, [facility], [place]    CAD (coronary artery disease) 05/22/2011   Carotid stenosis 05/22/2011   CHF CONGESTIVE HEART FAILURE    45% by echo 2015   COPD 12/01/2007   Qualifier: Diagnosis of  By: Clent Ridges NP, [name]     Dementia (HCC)    per pt's stepdaughter   DM2 (diabetes mellitus, type 2) (HCC)    pt's stepdaughter said he is no longer Diabetic, and does not take medication for it   DYSLIPIDEMIA    DYSPNEA    Edema    Essential hypertension 11/17/2007    Qualifier: Diagnosis of  By: Vernie Murders     GERD (gastroesophageal reflux disease)    Gout    HYPERTENSION    HYPOTHYROIDISM    MYOCARDIAL INFARCTION    MI 1999, stent x 2 to RCA, 70% circ, 30 and 40 % LADs   S/P updgrade to CRT-D 11/23/2020   WEIGHT GAIN, ABNORMAL        Home Medications Prior to Admission medications   Medication Sig Start Date End Date Taking? Authorizing Provider  aspirin EC 81 MG tablet Take 1 tablet (81 mg total) by mouth in the morning. Swallow whole. 06/15/22  Yes Rolly Salter, MD  atorvastatin (LIPITOR) 40 MG tablet TAKE 1 TABLET (40 MG TOTAL) BY MOUTH IN THE MORNING 11/23/22  Yes [name], MD  clopidogrel (PLAVIX) 75 MG tablet TAKE 1 TABLET (75 MG TOTAL) BY MOUTH IN THE MORNING 03/10/23  Yes Etta Grandchild, MD  famotidine (PEPCID) 40 MG tablet Take 40 mg by mouth in the morning.   Yes [provider]  folic acid (FOLVITE) 1 MG tablet TAKE 1 TABLET BY MOUTH EVERY DAY Patient taking differently: Take 1 mg by mouth at bedtime. 02/26/23  Yes Etta Grandchild, MD  furosemide (LASIX) 20 MG tablet TAKE 1 TABLET (20 MG TOTAL) BY MOUTH IN THE MORNING Patient taking differently: Take 20 mg by mouth at bedtime. 12/24/22 12/19/23 Yes Etta Grandchild, MD  levothyroxine (SYNTHROID)  125 MCG tablet TAKE 1 TABLET BY MOUTH DAILY BEFORE BREAKFAST. 12/24/22  Yes Etta Grandchild, MD  metoprolol succinate (TOPROL-XL) 50 MG 24 hr tablet TAKE 1 TABLET (50 MG TOTAL) BY MOUTH TWICE A DAY .TAKE WITH OR IMMEDIATELY FOLLOWING A MEAL 02/16/23  Yes Hochrein, Fayrene Fearing, MD  mirtazapine (REMERON) 30 MG tablet TAKE 1 TABLET BY MOUTH EVERY DAY Patient taking differently: Take 30 mg by mouth at bedtime. 01/08/23  Yes Etta Grandchild, MD  montelukast (SINGULAIR) 10 MG tablet TAKE 1 TABLET BY MOUTH EVERY DAY Patient taking differently: Take 10 mg by mouth at bedtime. 01/08/23  Yes Etta Grandchild, MD  Multiple Vitamin (MULTIVITAMIN WITH MINERALS) TABS tablet Take 1 tablet by mouth daily.  06/14/22  Yes Rolly Salter, MD  pantoprazole (PROTONIX) 40 MG tablet TAKE 1 TABLET (40 MG TOTAL) BY MOUTH IN THE MORNING AND AT BEDTIME Patient taking differently: Take 40 mg by mouth in the morning and at bedtime. 12/24/22  Yes Etta Grandchild, MD  REFRESH OPTIVE PF 0.5-0.9 % SOLN Place 1 drop into both eyes 3 (three) times daily as needed (for dryness).   Yes [provider]  Vaseline Petrolatum Gauze (VASELINE GAUZE) Apply topically daily. Patient taking differently: Apply 1 each topically See admin instructions. Every other day (both arms) 06/13/22  Yes Rolly Salter, MD  Monte Fantasia INHUB 250-50 MCG/ACT AEPB Inhale 1 puff into the lungs in the morning and at bedtime.   Yes [provider]  zinc gluconate 50 MG tablet Take 1 tablet (50 mg total) by mouth daily. 09/22/22  Yes Etta Grandchild, MD  acetaminophen (TYLENOL) 650 MG CR tablet Take 650 mg by mouth every 8 (eight) hours as needed for pain.    [provider]  docusate sodium (COLACE) 100 MG capsule Take 1 capsule (100 mg total) by mouth 2 (two) times daily as needed for mild constipation. 06/13/22   Rolly Salter, MD  levothyroxine Celene Kras) 150 MCG tablet Take 1 tablet (150 mcg total) by mouth daily before breakfast. 12/22/22   Etta Grandchild, MD  TUMS E-X 750 750 MG chewable tablet Chew 1-4 tablets by mouth daily.    [provider]      Allergies    Tape, Prednisone, and Sulfa antibiotics    Review of Systems   Review of Systems A 10 point review of systems was performed and is negative unless otherwise reported in HPI.  Physical Exam Updated Vital Signs BP (!) 99/52 (BP Location: Left Arm)   Pulse 81   Temp 97.7 F (36.5 C) (Oral)   Resp 18   SpO2 100%  Physical Exam General: Chronically ill-appearing elderly gentleman sitting in a wheelchair  HEENT: PERRLA, Sclera anicteric, MMM, trachea midline. NCAT, no midline C-spine TTP.  Cardiology: RRR, no murmurs/rubs/gallops. BL radial and  DP pulses equal bilaterally.  Resp: Normal respiratory rate and effort. CTAB, no wheezes, rhonchi, crackles.  Abd: Soft, non-tender, non-distended. No rebound tenderness or guarding.  Pelvis: Pelvis stable, non-tender. MSK: 2+ peripheral edema bilaterally. Extremities without deformity or TTP. Skin: warm, dry.  Neuro: A&Ox2-3, CNs II-XII grossly intact. MAEs. Sensation grossly intact.  Psych: Normal mood and affect.   ED Results / Procedures / Treatments   Labs (all labs ordered are listed, but only abnormal results are displayed) Labs Reviewed  CBC WITH DIFFERENTIAL/PLATELET  LACTIC ACID, PLASMA  LACTIC ACID, PLASMA  COMPREHENSIVE METABOLIC PANEL  MAGNESIUM  PROTIME-INR  APTT  URINALYSIS, ROUTINE W REFLEX MICROSCOPIC  I-STAT CHEM 8, ED  TROPONIN I (HIGH SENSITIVITY)    EKG EKG Interpretation Date/Time:  Friday March 12 2023 22:00:40 EDT Ventricular Rate:  81 PR Interval:  37 QRS Duration:  245 QT Interval:  498 QTC Calculation: 633 R Axis:   255  Text Interpretation: Atrial-paced complexes No further analysis attempted due to paced rhythm Confirmed by Gloris Manchester ([id number]) on 03/16/2023 12:42:20 AM  Radiology CXR: Small right pleural effusion and central pulmonary vascular congestion.  Humerus XR: 1. No humeral fracture or malalignment at the glenohumeral joint.   Procedures .Critical Care  Performed by: Loetta Rough, MD Authorized by: Loetta Rough, MD   Critical care provider statement:    Critical care time (minutes):  35   Critical care was necessary to treat or prevent imminent or life-threatening deterioration of the following conditions:  Cardiac failure, shock and endocrine crisis   Critical care was time spent personally by me on the following activities:  Development of treatment plan with patient or surrogate, discussions with consultants, evaluation of patient's response to treatment, examination of patient, ordering and review of laboratory  studies, ordering and review of radiographic studies, ordering and performing treatments and interventions, pulse oximetry, re-evaluation of patient's condition, review of old charts and obtaining history from patient or surrogate   Care discussed with: accepting provider at another facility       Medications Ordered in ED Medications  dextrose 50 % solution 50 mL (50 mLs Intravenous Given 03/12/23 1648)  dextrose 50 % solution 50 mL (50 mLs Intravenous Given 03/12/23 1749)  dextrose 50 % solution 25 g (25 g Intravenous Given by Other 03/12/23 2145)    ED Course/ Medical Decision Making/ A&P                          Medical Decision Making Amount and/or Complexity of Data Reviewed Labs: ordered. Decision-making details documented in ED Course. Radiology: ordered.  Risk Prescription drug management. Decision regarding hospitalization.    This patient presents to the ED for concern of weak/dizzy, hypotension, hypoglycemia; this involves an extensive number of treatment options, and is a complaint that carries with it a high risk of complications and morbidity.  I considered the following differential and admission for this acute, potentially life threatening condition.   MDM:    Consider cardiogenic shock d/t severe ischemic cardiomyopathy, new cardiac ischemia, or arrhythmia. Does have pacemaker/ICD in place which has not shocked him and unclear if pacing. Will order pacemaker interrogation. Consider hypovolemia d/t poor PO intake/orthostasis given report of lightheadedness w/ standing. No fevers but must also rule out septic shock/infectious cause of sxs such as UTI or PNA. Giving  From a traumatic standpoint, he has no deformity to elbow/shoulder, is NCAT on exam, no neck pain, no midline C-spine TTP to suggest vertebral injury. XR is neg for any shoulder/humerus/elbow fx or dislocation. CXR shows no obvious displaced rib fractures or hemo/pneumothorax, PNA. Does show small R pleural  effusion w/ pulm vasc congestion - pt is not hypoxic, in no resp distress. Does support a HF cause of his weak/dizzy sxs.   For his hypoglycemia, patient is not diabetic and does not take exogenous insulin. Consider poor PO intake and/or critical illness.   Clinical Course as of 03/17/23 0814  Fri Mar 12, 2023  1609 Patient with new wide-complex idioventricular rhythm, seemingly right bundle, with significant ST depressions in anterior leads, T wave inversions, and some ST elevations as  well in inferior leads.  Patient was initially hypotensive into the 70s systolic and therefore I called a code STEMI due to his instability and significant EKG changes as he is normally in normal sinus rhythm.  Discussed with Dr. Clifton James with cardiology who reviewed the EKG with his EP colleagues.  Decided to cancel the code STEMI however requested that patient be emergently transferred [facility] to [facility] ED to be evaluated by cards and EP.  In the meantime, without any fluid resuscitation, patient's blood pressure does improve to 120 systolic.  Discussed with patient's daughter and wife at bedside.  [HN]  1624 BG on Istat 25 mg/dL. Patient drinking orange juice now, as patient is no longer going to Cath lab. D/w daughter who states that he does not take insulin and is not diabetic. Istat was drawn from IV, not from finger stick. Will  [HN]  1626 Had ordered calcium gluconate IV for wide complex rhythm, as hyperkalemia on differential. However prior to it being given, K came back at 4.5 on istat, so calcium was cancelled. [HN]  1627 Potassium: 4.5 [HN]  1627 Creatinine(!): 3.10 +AKI, w/ high BUN and clinical history likely pre-renal in nature.  [HN]  1627 BUN(!): 67 [HN]  1627 Hemoglobin(!): 9.9 Stable [HN]  1627 Lactic Acid, Venous: 1.0 wnl [HN]  1630 Glucose-Capillary(!!): 14 From fingerstick. Patient drinking orange juice actively. [HN]  1647 18 min recheck is glucose 20 mg/dL after orange juice. Giving d50  [HN]  1704 CBG Recheck after D50 in the 40s. Starting D10 drip [HN]  1744 Pt on EMS stretcher and glucose still 38 mg/dL checked from IV [HN]  1478 Glucose-Capillary(!): 333 After another unit of D50, Pt going with transport to [place] ED now [HN]  Sat Mar 13, 2023  0004 Patient transferred from outside hospital for cardiology evaluation.  Patient evaluated by cardiology who felt symptoms were most likely due to paced EKG.  Incidentally patient hypoglycemic.  He required multiple doses of D50 while also already being on a D10 infusion.  Unclear cause.  He does not use insulin at home.  He does have an AKI.  Family reports that he was normal until he fell today.  Falling would not explain this.  Obtain CT head as he possibly did hit his head.  This is negative.  Given recurrent hypoglycemia, discussed with ICU, patient does have significant systolic heart failure and will need close monitoring while on infusion, they will admit patient. [WS]    Clinical Course User Index [HN] Loetta Rough, MD [WS] Lonell Grandchild, MD    Labs: I Ordered, and personally interpreted labs.  The pertinent results include:  those listed above  Imaging Studies ordered: I ordered imaging studies including CXR, shoulder/humerus/elbow XR I independently visualized and interpreted imaging. I agree with the radiologist interpretation  Additional history obtained from chart review, family at bedside.    Cardiac Monitoring: The patient was maintained on a cardiac monitor.  I personally viewed and interpreted the cardiac monitored which showed an underlying rhythm of: regular wide complex rhythm  Reevaluation: After the interventions noted above, I reevaluated the patient and found that they have :improved  Social Determinants of Health: Lives with family  Disposition:  Transfer via transport to [facility] for subsequent admission, cardiology following  Co morbidities that complicate the patient  evaluation  Past Medical History:  Diagnosis Date   Atrial flutter (HCC)    (I could not find documentation of this rhythm.)   AUTOMATIC  IMPLANTABLE CARDIAC DEFIBRILLATOR SITU    Automatic implantable cardioverter-defibrillator in situ 12/05/2009   Qualifier: Diagnosis of  By: Ladona Ridgel, MD, [facility], [place]    CAD (coronary artery disease) 05/22/2011   Carotid stenosis 05/22/2011   CHF CONGESTIVE HEART FAILURE    45% by echo 2015   COPD 12/01/2007   Qualifier: Diagnosis of  By: Clent Ridges NP, [name]     Dementia (HCC)    per pt's stepdaughter   DM2 (diabetes mellitus, type 2) (HCC)    pt's stepdaughter said he is no longer Diabetic, and does not take medication for it   DYSLIPIDEMIA    DYSPNEA    Edema    Essential hypertension 11/17/2007   Qualifier: Diagnosis of  By: Vernie Murders     GERD (gastroesophageal reflux disease)    Gout    HYPERTENSION    HYPOTHYROIDISM    MYOCARDIAL INFARCTION    MI 1999, stent x 2 to RCA, 70% circ, 30 and 40 % LADs   S/P updgrade to CRT-D 11/23/2020   WEIGHT GAIN, ABNORMAL      Medicines Meds ordered this encounter  Medications   DISCONTD: lactated ringers bolus 1,000 mL    I have reviewed the patients home medicines and have made adjustments as needed  Problem List / ED Course: Problem List Items Addressed This Visit       Other   Dizziness - Primary   Other Visit Diagnoses     Generalized weakness       Fall in home, initial encounter       EKG abnormalities                       This note was created using dictation software, which may contain spelling or grammatical errors.    Loetta Rough, MD 03/17/23 0830

## 2023-03-12 NOTE — ED Notes (Signed)
[address] Judes regarding receiving interrogation. Will call back when rep is available.

## 2023-03-12 NOTE — ED Notes (Signed)
CBG 14. MD notified. MD instructed to give juice to correct glucose levels. Pt given multiple orange juices that had 12 packs of sugar in them. CBG retested over 15 min from initial orange juices and CBG was 20.

## 2023-03-12 NOTE — ED Notes (Signed)
Dr Eden Emms [phone] asked to make sure interrogation was received by [facility]. Judes.

## 2023-03-13 ENCOUNTER — Inpatient Hospital Stay (HOSPITAL_COMMUNITY): Payer: Medicare Other

## 2023-03-13 ENCOUNTER — Other Ambulatory Visit (HOSPITAL_COMMUNITY): Payer: Medicare Other

## 2023-03-13 DIAGNOSIS — I959 Hypotension, unspecified: Secondary | ICD-10-CM

## 2023-03-13 DIAGNOSIS — E162 Hypoglycemia, unspecified: Secondary | ICD-10-CM | POA: Diagnosis not present

## 2023-03-13 DIAGNOSIS — R42 Dizziness and giddiness: Secondary | ICD-10-CM | POA: Diagnosis not present

## 2023-03-13 LAB — BASIC METABOLIC PANEL
Anion gap: 11 (ref 5–15)
Anion gap: 7 (ref 5–15)
BUN: 61 mg/dL — ABNORMAL HIGH (ref 8–23)
BUN: 59 mg/dL — ABNORMAL HIGH (ref 8–23)
CO2: 23 mmol/L (ref 22–32)
CO2: 25 mmol/L (ref 22–32)
Calcium: 8.5 mg/dL — ABNORMAL LOW (ref 8.9–10.3)
Calcium: 8.4 mg/dL — ABNORMAL LOW (ref 8.9–10.3)
Chloride: 98 mmol/L (ref 98–111)
Chloride: 98 mmol/L (ref 98–111)
Creatinine, Ser: 2.34 mg/dL — ABNORMAL HIGH (ref 0.61–1.24)
Creatinine, Ser: 2.21 mg/dL — ABNORMAL HIGH (ref 0.61–1.24)
GFR, Estimated: 26 mL/min — ABNORMAL LOW (ref >60)
GFR, Estimated: 28 mL/min — ABNORMAL LOW (ref >60)
Glucose, Bld: 76 mg/dL (ref 70–99)
Glucose, Bld: 118 mg/dL — ABNORMAL HIGH (ref 70–99)
Potassium: 4.4 mmol/L (ref 3.5–5.1)
Potassium: 3.9 mmol/L (ref 3.5–5.1)
Sodium: 132 mmol/L — ABNORMAL LOW (ref 135–145)
Sodium: 130 mmol/L — ABNORMAL LOW (ref 135–145)

## 2023-03-13 LAB — GLUCOSE, CAPILLARY
Glucose-Capillary: 105 mg/dL — ABNORMAL HIGH (ref 70–99)
Glucose-Capillary: 155 mg/dL — ABNORMAL HIGH (ref 70–99)
Glucose-Capillary: 163 mg/dL — ABNORMAL HIGH (ref 70–99)
Glucose-Capillary: 168 mg/dL — ABNORMAL HIGH (ref 70–99)
Glucose-Capillary: 196 mg/dL — ABNORMAL HIGH (ref 70–99)
Glucose-Capillary: 241 mg/dL — ABNORMAL HIGH (ref 70–99)
Glucose-Capillary: 85 mg/dL (ref 70–99)

## 2023-03-13 LAB — CBC
HCT: 25.8 % — ABNORMAL LOW (ref 39.0–52.0)
Hemoglobin: 8.4 g/dL — ABNORMAL LOW (ref 13.0–17.0)
MCH: 33.3 pg (ref 26.0–34.0)
MCHC: 32.6 g/dL (ref 30.0–36.0)
MCV: 102.4 fL — ABNORMAL HIGH (ref 80.0–100.0)
Platelets: 203 10*3/uL (ref 150–400)
RBC: 2.52 MIL/uL — ABNORMAL LOW (ref 4.22–5.81)
RDW: 21.2 % — ABNORMAL HIGH (ref 11.5–15.5)
WBC: 8.5 10*3/uL (ref 4.0–10.5)
nRBC: 0 % (ref 0.0–0.2)

## 2023-03-13 LAB — T4, FREE: Free T4: 0.66 ng/dL (ref 0.61–1.12)

## 2023-03-13 LAB — HEMOGLOBIN A1C
Hgb A1c MFr Bld: 5.3 % (ref 4.8–5.6)
Mean Plasma Glucose: 105.41 mg/dL

## 2023-03-13 LAB — PROCALCITONIN: Procalcitonin: <0.1 ng/mL

## 2023-03-13 LAB — MRSA NEXT GEN BY PCR, NASAL: MRSA by PCR Next Gen: NOT DETECTED

## 2023-03-13 LAB — MAGNESIUM: Magnesium: 1.6 mg/dL — ABNORMAL LOW (ref 1.7–2.4)

## 2023-03-13 LAB — CBG MONITORING, ED: Glucose-Capillary: 84 mg/dL (ref 70–99)

## 2023-03-13 LAB — TSH: TSH: 14.748 u[IU]/mL — ABNORMAL HIGH (ref 0.350–4.500)

## 2023-03-13 MED ORDER — CHLORHEXIDINE GLUCONATE CLOTH 2 % EX PADS
6.0000 | MEDICATED_PAD | Freq: Every day | CUTANEOUS | Status: DC
  Administered 2023-03-13 – 2023-03-18 (×6): 6 via TOPICAL

## 2023-03-13 MED ORDER — CALCIUM CARBONATE ANTACID 500 MG PO CHEW
2.0000 | CHEWABLE_TABLET | Freq: Two times a day (BID) | ORAL | Status: DC | PRN
  Administered 2023-03-13: 400 mg via ORAL
  Filled 2023-03-13: qty 2

## 2023-03-13 MED ORDER — ORAL CARE MOUTH RINSE: 15.0000 mL | OROMUCOSAL | Status: DC | PRN

## 2023-03-13 MED ORDER — CALCIUM CARBONATE ANTACID 500 MG PO CHEW
1.0000 | CHEWABLE_TABLET | Freq: Once | ORAL | Status: AC
  Administered 2023-03-13: 200 mg via ORAL
  Filled 2023-03-13: qty 1

## 2023-03-13 MED ORDER — ENSURE ENLIVE PO LIQD
237.0000 mL | Freq: Two times a day (BID) | ORAL | Status: DC
  Administered 2023-03-13 – 2023-03-18 (×10): 237 mL via ORAL

## 2023-03-13 MED ORDER — SODIUM CHLORIDE 0.9% FLUSH
10.0000 mL | Freq: Two times a day (BID) | INTRAVENOUS | Status: DC
  Administered 2023-03-13 – 2023-03-17 (×8): 10 mL via INTRAVENOUS

## 2023-03-13 MED ORDER — SODIUM CHLORIDE 0.9 % IV SOLN: INTRAVENOUS | Status: AC

## 2023-03-13 NOTE — ED Provider Notes (Signed)
.Critical Care  Performed by: Lonell Grandchild, MD Authorized by: Lonell Grandchild, MD   Critical care provider statement:    Critical care time (minutes):  30   Critical care was necessary to treat or prevent imminent or life-threatening deterioration of the following conditions:  Endocrine crisis   Critical care was time spent personally by me on the following activities:  Development of treatment plan with patient or surrogate, discussions with consultants, evaluation of patient's response to treatment, examination of patient, ordering and review of laboratory studies, ordering and review of radiographic studies, ordering and performing treatments and interventions, pulse oximetry, re-evaluation of patient's condition and review of old charts    ED Course / MDM   Clinical Course as of 03/13/23 0006  Fri Mar 12, 2023  1609 Patient with new wide-complex idioventricular rhythm, seemingly right bundle, with significant ST depressions in anterior leads, T wave inversions, and some ST elevations as well in inferior leads.  Patient was initially hypotensive into the 70s systolic and therefore I called a code STEMI due to his instability and significant EKG changes as he is normally in normal sinus rhythm.  Discussed with Dr. Clifton James with cardiology who reviewed the EKG with his EP colleagues.  Decided to cancel the code STEMI however requested that patient be emergently transferred [facility] to [facility] ED to be evaluated by cards and EP.  In the meantime, without any fluid resuscitation, patient's blood pressure does improve to 120 systolic.  Discussed with patient's daughter and wife at bedside.  [HN]  1624 BG on Istat 25 mg/dL. Patient drinking orange juice now. D/w daughter who states that he does not take insulin and is not diabetic. Istat was drawn from IV, not from finger stick. Will  [HN]  1626 Had ordered calcium gluconate IV for wide complex rhythm, as hyperkalemia on differential.  However prior to it being given, K came back at 4.5 on istat, so calcium was cancelled. [HN]  1627 Potassium: 4.5 [HN]  1627 Creatinine(!): 3.10 +AKI [HN]  1627 BUN(!): 67 [HN]  1627 Hemoglobin(!): 9.9 Stable [HN]  1627 Lactic Acid, Venous: 1.0 wnl [HN]  1630 Glucose-Capillary(!!): 14 From fingerstick. Patient drinking orange juice actively. [HN]  1647 18 min recheck is glucose 20 mg/dL after orange juice. Giving d50 [HN]  1704 CBG Recheck after D50 in the 40s. Starting D10 drip [HN]  1744 Pt on EMS stretcher and glucose still 38 mg/dL checked from IV [HN]  7829 Glucose-Capillary(!): 333 After another unit of D50, Pt going with transport to [place] ED now [HN]  Sat Mar 13, 2023  0004 Patient transferred from outside hospital for cardiology evaluation.  Patient evaluated by cardiology who felt symptoms were most likely due to paced EKG.  Incidentally patient hypoglycemic.  He required multiple doses of D50 while also already being on a D10 infusion.  Unclear cause.  He does not use insulin at home.  He does have an AKI.  Family reports that he was normal until he fell today.  Falling would not explain this.  Obtain CT head as he possibly did hit his head.  This is negative.  Given recurrent hypoglycemia, discussed with ICU, patient does have significant systolic heart failure and will need close monitoring while on infusion, they will admit patient. [WS]    Clinical Course User Index [HN] Loetta Rough, MD [WS] Lonell Grandchild, MD   Medical Decision Making Amount and/or Complexity of Data Reviewed Labs: ordered. Decision-making details documented in  ED Course. Radiology: ordered.  Risk Prescription drug management. Decision regarding hospitalization.          Lonell Grandchild, MD 03/13/23 [phone]

## 2023-03-13 NOTE — Progress Notes (Signed)
Cardiology Note  See note from [place], [place] and EP  No current cardiology issues .  Following .    Kristeen Miss, MD  03/13/2023 8:50 AM    [facility] [address] [place],  [address] [place], [place]  [address] Phone: [phone]; Fax: [phone]

## 2023-03-13 NOTE — Plan of Care (Signed)
Problem: Elimination: Goal: Will not experience complications related to bowel motility Outcome: Progressing   Problem: Skin Integrity: Goal: Risk for impaired skin integrity will decrease Outcome: Progressing

## 2023-03-13 NOTE — Progress Notes (Signed)
Spoke with both overnight fellow and Dr. Lalla Brothers of EP who have reviewed this patient's [facility] [employer] device interrogation. There is no evidence of VT. His abnormal EKG was due to combination of LV lead pacing only (RV lead turned off in Jan 2024 due to high pacing threshold) and fluid derangement. Device working properly. No need for EP consult.

## 2023-03-13 NOTE — H&P (Signed)
NAME:  MOMIN PIENKOWSKI, MRN:  [id number], DOB:  06-17-1933, LOS: 1 ADMISSION DATE:  03/12/2023, CONSULTATION DATE:  10/11 REFERRING MD:  Dr. Suezanne Jacquet, CHIEF COMPLAINT:  hypoglycemia   History of Present Illness:  Patient is a 89 yo M w/ pertinent PMH aflutter w/ ICD in place, CAD s/p stent x2, chronic systolic chf (EF 44-01% w/ grade one diastolic dysfx), mild dementia, PAD s/p b/l common iliac stenting 2019 COPD, asthma, t2dm, hld, htn, hypothyroidism present to [facility] ED on 10/11 after lightheadedness/fall.  Patient last seen by cardiology on June 2024. BP borderline low but kept on metoprolol. Per family patient has poor appetite. Having several episodes of lightheadedness when he gets up. On 10/11 patient became dizzy when getting up from chair and fell on his right elbow. Denies any LOC. Patient daughter heard fall and ems transported to [facility] ED. On arrival BP 125/60 and afebrile. Given IV fluids. CBG 29 given dextrose and started on d10 drip. CT head no acute abnormality. CXR w/ small right pleural effusion. Elbow xray negative for acute fracture and gauze dressing placed over skin tear. Creat 2.81 (baseline 1.8). EKG w/ paced rhythm w/ RBBB. Cardiology consulted suspect related to LV pacing. Believe patient's dizziness is related to dehydration and orthostatic hypotension. Recommend transfer to [facility]. Patient remained hypoglycemic despite being on d10 drip and bp becoming more hypotensive w/ maps in 60s. PCCM consulted for icu admission.   According to the patient's family he has had issues with orthostatic hypotension for some time.  They feel that his memory issues have been worsening.  Lack of interest in eating has also increased.  Pertinent  Medical History   Past Medical History:  Diagnosis Date   Atrial flutter (HCC)    (I could not find documentation of this rhythm.)   AUTOMATIC IMPLANTABLE CARDIAC DEFIBRILLATOR SITU    Automatic implantable cardioverter-defibrillator in situ 12/05/2009    Qualifier: Diagnosis of  By: Ladona Ridgel, MD, [facility], Vergia Alcon    CAD (coronary artery disease) 05/22/2011   Carotid stenosis 05/22/2011   CHF CONGESTIVE HEART FAILURE    45% by echo 2015   COPD 12/01/2007   Qualifier: Diagnosis of  By: Clent Ridges NP, [name]     Dementia (HCC)    per pt's stepdaughter   DM2 (diabetes mellitus, type 2) (HCC)    pt's stepdaughter said he is no longer Diabetic, and does not take medication for it   DYSLIPIDEMIA    DYSPNEA    Edema    Essential hypertension 11/17/2007   Qualifier: Diagnosis of  By: Vernie Murders     GERD (gastroesophageal reflux disease)    Gout    HYPERTENSION    HYPOTHYROIDISM    MYOCARDIAL INFARCTION    MI 1999, stent x 2 to RCA, 70% circ, 30 and 40 % LADs   S/P updgrade to CRT-D 11/23/2020   WEIGHT GAIN, ABNORMAL      Significant Hospital Events: Including procedures, antibiotic start and stop dates in addition to other pertinent events   10/11 admitted after fall; found to be hypoglycemic; bp soft  Interim History / Subjective:  Denies any shortness of breath.  Feels well at this time.  Objective   Blood pressure (!) 131/50, pulse 70, temperature 98.3 F (36.8 C), temperature source Oral, resp. rate 20, height 5\' 8"  (1.727 m), weight 68.8 kg, SpO2 100%.        Intake/Output Summary (Last 24 hours) at 03/13/2023 1704 Last data filed at 03/13/2023 1600 Gross per  24 hour  Intake 2649.92 ml  Output 1800 ml  Net 849.92 ml   Filed Weights   03/13/23 0109  Weight: 68.8 kg    Examination: General:  elderly male in NAD HEENT: MM pink/moist Neuro: Aox3; MAE CV: s1s2, paced rhythm w/ rates 80s, no m/r/g, JVP 2 cm above sternal angle PULM:  dim clear BS bilaterally GI: soft, bsx4 active  Extremities: warm/dry, ble edema +3 Skin: no rashes or lesions    Ancillary test personally reviewed  TSH 14 Sodium 130, creatinine 2.21, magnesium 1.6 Moderate microcytic anemia at 8.4  Assessment & Plan:   Hypotension and  hypoglycemia in the context of progressive dementia with poor oral intake and known HFrEF with ICD in place. Syncopal episode yesterday with no evidence of dysrhythmia on pacemaker/ICD interrogation. History of orthostatic hypotension and gait instability per family. No history of diabetes per family. Atrial flutter CKD stage III. COPD. Hypothyroidism. Prior history of dyslipidemia Prior history of hypertension GERD  Plan:  -Wean norepinephrine to keep systolic blood pressure greater than 110. -Clinically appears euvolemic at this time.  No further fluid resuscitation. -Holding GDMT at this time.  Given history of orthostatic hypotension will reintroduce at lower dose. -Compression stockings to improve venous return. -Check T4 and adjust levothyroxine accordingly. -Ultrasound right upper quadrant to rule out cirrhosis as potential cause of hypoglycemia.  Otherwise most likely due to to inadequate reserve from undernutrition. -Echocardiogram to reassess LV function as symptoms of hypotension and loss of appetite may also be due to progressive heart failure. -Discussed with family the multifactorial nature of dizziness and loss of appetite in the elderly and that there may not be a simple solution for these problems and that they will complicate management of his heart failure in the long run. -Palliative care consultation.  Best Practice (right click and "Reselect all SmartList Selections" daily)   Diet/type: Regular diet DVT prophylaxis: prophylactic heparin  GI prophylaxis: PPI Lines: N/A Foley:  N/A Code Status:  full code Last date of multidisciplinary goals of care discussion [10/11 attempted to reach daughter and wife over phone but no answer. Patient has been a DNR before on last admission 06/2022. Patient states he is unsure if he is DNR but to do everything for now. Will need to revisit conversation w/ family.]  CRITICAL CARE Performed by: Lynnell Catalan   Total critical  care time: 34 minutes  Critical care time was exclusive of separately billable procedures and treating other patients.  Critical care was necessary to treat or prevent imminent or life-threatening deterioration.  Critical care was time spent personally by me on the following activities: development of treatment plan with patient and/or surrogate as well as nursing, discussions with consultants, evaluation of patient's response to treatment, examination of patient, obtaining history from patient or surrogate, ordering and performing treatments and interventions, ordering and review of laboratory studies, ordering and review of radiographic studies, pulse oximetry, re-evaluation of patient's condition and participation in multidisciplinary rounds.  Lynnell Catalan, MD [facility] ICU Physician [facility]  Pager: [phone] Mobile: [phone] After hours: [phone].

## 2023-03-13 NOTE — Progress Notes (Signed)
Patient's skin very thin and bruised. IV Watch continues to alarm no matter the IV it's placed on. Discussed with IV team. Will monitor site for levophed manually.

## 2023-03-13 NOTE — ED Notes (Signed)
Report received from Ramon Dredge. Paramedic. Assumed care of pt at this time.

## 2023-03-13 NOTE — ED Notes (Signed)
Pt transported to assigned unit accompanied by RN and paramedic with belongings, cardiac monitor, and med infusions.

## 2023-03-13 NOTE — Progress Notes (Signed)
Patient able to void small amounts all day but not complaining of being uncomfortable. Bladder scan revealed 931 mL. Order received from Agarwala MD to place foley.

## 2023-03-13 NOTE — Progress Notes (Addendum)
eLink Physician-Brief Progress Note Patient Name: [name] DOB: [birth year] MRN: [id number]   Date of Service  03/13/2023  HPI/Events of Note   89 yo M w/ pertinent PMH aflutter w/ ICD in place, CAD s/p stent x2, chronic systolic chf (EF 40-98% w/ grade one diastolic dysfx), mild dementia, PAD s/p b/l common iliac stenting 2019 COPD, asthma, t2dm, hld, htn, hypothyroidism    Now in ICU . Camera evaluation and discussion with bed side team done for 1) severe persisting hypoglycemia, on D10 at 125 ml/hr and has AKI on CKD- with elevated BUN suggestive of dehydration, volume down-pre renal component. Glucose still at 86.  No hx of DM. Reason for hypoglycemia is not clear. CTH neg.  2). CHF stable for now. Getting saline at 75 ml/hr also. Would continue for now, until Creatinine improves or will stop if gets sob.stable troponin at 18, not doubling. CxR do show mild CHF signs. Watch for now. Paced rhythm , cards reviewed.   3). Concern for left lower and suprapubic firmness, distension per bedside RN assessment. Passed bowel yesterday per patient. Wbc normal. LFT ok also. UA neg. No obvious sepsis.  - get sono for any retension, if not, would go for CT abd/renal w/o contrast stat.  4) anemia, appear chornic, no bleeding signs    eICU Interventions  5). VTE: on sq heparin . Ordered.         Ranee Gosselin 03/13/2023, 1:22 AM  01:56 Pt bladder firm, scan showed > 1 L. Pt has voided but not empying.  - straight cath stat, re scan in 4 hrs. Will not go for CT abdomen for now.

## 2023-03-14 ENCOUNTER — Other Ambulatory Visit (HOSPITAL_COMMUNITY): Payer: Medicare Other

## 2023-03-14 DIAGNOSIS — I255 Ischemic cardiomyopathy: Secondary | ICD-10-CM | POA: Diagnosis not present

## 2023-03-14 DIAGNOSIS — Z7189 Other specified counseling: Secondary | ICD-10-CM | POA: Diagnosis not present

## 2023-03-14 DIAGNOSIS — I5042 Chronic combined systolic (congestive) and diastolic (congestive) heart failure: Secondary | ICD-10-CM | POA: Diagnosis not present

## 2023-03-14 DIAGNOSIS — Z515 Encounter for palliative care: Secondary | ICD-10-CM | POA: Diagnosis not present

## 2023-03-14 LAB — ECHOCARDIOGRAM COMPLETE
AR max vel: 1.32 cm2
AV Area VTI: 1.37 cm2
AV Area mean vel: 1.46 cm2
AV Mean grad: 7.2 mm[Hg]
AV Peak grad: 14.8 mm[Hg]
Ao pk vel: 1.93 m/s
Calc EF: 43.4 %
Height: 68 in
S' Lateral: 3.9 cm
Single Plane A2C EF: 43 %
Single Plane A4C EF: 47.8 %
Weight: 2345.69 [oz_av]

## 2023-03-14 LAB — CBC WITH DIFFERENTIAL/PLATELET
Abs Immature Granulocytes: 0.06 10*3/uL (ref 0.00–0.07)
Basophils Absolute: 0 10*3/uL (ref 0.0–0.1)
Basophils Relative: 1 %
Eosinophils Absolute: 0.3 10*3/uL (ref 0.0–0.5)
Eosinophils Relative: 4 %
HCT: 24.8 % — ABNORMAL LOW (ref 39.0–52.0)
Hemoglobin: 8 g/dL — ABNORMAL LOW (ref 13.0–17.0)
Immature Granulocytes: 1 %
Lymphocytes Relative: 23 %
Lymphs Abs: 1.6 10*3/uL (ref 0.7–4.0)
MCH: 32.4 pg (ref 26.0–34.0)
MCHC: 32.3 g/dL (ref 30.0–36.0)
MCV: 100.4 fL — ABNORMAL HIGH (ref 80.0–100.0)
Monocytes Absolute: 0.4 10*3/uL (ref 0.1–1.0)
Monocytes Relative: 6 %
Neutro Abs: 4.5 10*3/uL (ref 1.7–7.7)
Neutrophils Relative %: 65 %
Platelets: 184 10*3/uL (ref 150–400)
RBC: 2.47 MIL/uL — ABNORMAL LOW (ref 4.22–5.81)
RDW: 21.3 % — ABNORMAL HIGH (ref 11.5–15.5)
WBC: 6.9 10*3/uL (ref 4.0–10.5)
nRBC: 0 % (ref 0.0–0.2)

## 2023-03-14 LAB — BASIC METABOLIC PANEL
Anion gap: 10 (ref 5–15)
BUN: 48 mg/dL — ABNORMAL HIGH (ref 8–23)
CO2: 23 mmol/L (ref 22–32)
Calcium: 9 mg/dL (ref 8.9–10.3)
Chloride: 102 mmol/L (ref 98–111)
Creatinine, Ser: 1.76 mg/dL — ABNORMAL HIGH (ref 0.61–1.24)
GFR, Estimated: 37 mL/min — ABNORMAL LOW (ref >60)
Glucose, Bld: 152 mg/dL — ABNORMAL HIGH (ref 70–99)
Potassium: 4.9 mmol/L (ref 3.5–5.1)
Sodium: 135 mmol/L (ref 135–145)

## 2023-03-14 LAB — BRAIN NATRIURETIC PEPTIDE: B Natriuretic Peptide: 347.2 pg/mL — ABNORMAL HIGH (ref 0.0–100.0)

## 2023-03-14 LAB — GLUCOSE, CAPILLARY
Glucose-Capillary: 168 mg/dL — ABNORMAL HIGH (ref 70–99)
Glucose-Capillary: 123 mg/dL — ABNORMAL HIGH (ref 70–99)
Glucose-Capillary: 147 mg/dL — ABNORMAL HIGH (ref 70–99)
Glucose-Capillary: 139 mg/dL — ABNORMAL HIGH (ref 70–99)
Glucose-Capillary: 163 mg/dL — ABNORMAL HIGH (ref 70–99)

## 2023-03-14 MED ORDER — LEVOTHYROXINE SODIUM 75 MCG PO TABS
150.0000 ug | ORAL_TABLET | Freq: Every day | ORAL | Status: DC
  Administered 2023-03-15 – 2023-03-16 (×2): 150 ug via ORAL
  Filled 2023-03-14 (×2): qty 2

## 2023-03-14 MED ORDER — ALBUMIN HUMAN 5 % IV SOLN
12.5000 g | Freq: Once | INTRAVENOUS | Status: AC
  Administered 2023-03-14: 12.5 g via INTRAVENOUS
  Filled 2023-03-14: qty 250

## 2023-03-14 MED ORDER — MIDODRINE HCL 5 MG PO TABS
5.0000 mg | ORAL_TABLET | Freq: Three times a day (TID) | ORAL | Status: DC
  Administered 2023-03-14 – 2023-03-15 (×2): 5 mg via ORAL
  Filled 2023-03-14 (×2): qty 1

## 2023-03-14 NOTE — Consult Note (Signed)
WOC Nurse Consult Note: Reason for Consult: skin tear Wound type:trauma Pressure Injury POA: NA Measurement:see nursing flow sheet Wound ZOX:[id number], painful, red Drainage (amount, consistency, odor) moderate, serosanguinous  Periwound: intact, ecchymosis  Dressing procedure/placement/frequency: Cleanse right arm wounds with saline, pat dry.apply single layer of xeroform gauze, top with dry dressing. Change every other day  Re consult if needed, will not follow at this time. Thanks  Stella Encarnacion [employer], RN,CWOCN, CNS, CWON-AP [phone])

## 2023-03-14 NOTE — Consult Note (Signed)
Palliative Medicine Inpatient Consult Note  Consulting Provider:  Lynnell Catalan, MD    Reason for consult:   Palliative Care Consult Services Palliative Medicine Consult  Reason for Consult? advancing HF and dementia   03/14/2023  HPI:  Per intake H&P --> 89 yo M w/ pertinent PMH aflutter w/ ICD in place, CAD s/p stent x2, chronic systolic chf (EF 21-30% w/ grade one diastolic dysfx), mild dementia, PAD s/p b/l common iliac stenting 2019 COPD, asthma, t2dm, hld, htn, hypothyroidism. Admitted for fall was found to be hypoglycemic and hypotensive. Palliative care has been asked to get involved to further discuss code status and goals of care.  Clinical Assessment/Goals of Care:  *Please note that this is a verbal dictation therefore any spelling or grammatical errors are due to the "Dragon Medical One" system interpretation.  I have reviewed medical records including EPIC notes, labs and imaging, received report from bedside RN, assessed the patient who is lying in bed aware of who he is though thinks we are in his home and that his wife is next to him.    I met with patients daughter, Elease Hashimoto and wife, Ila to further discuss diagnosis prognosis, GOC, EOL wishes, disposition and options.   I introduced Palliative Medicine as specialized medical care for people living with serious illness. It focuses on providing relief from the symptoms and stress of a serious illness. The goal is to improve quality of life for both the patient and the family.  Medical History Review and Understanding:  A review of Taariq's past medical history of aflutter, CAD, advanced heart failure, PAD, dementia - type not specified, T2DM, HTN, and hypothyroidism was held.   Social History:  Yandel is from [place], [place]. He and his wife have been married for > 40 years. He has two children and three grandchildren. 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 He is a proud man. He is a man of faith and practices within the [employer] denomination.   Functional and Nutritional State:  Preceding admission, Jamyron would mobilize though this was inhibited by his dizziness after medications in the morning. He would have to sit in the chair for a prolonged period of time be fore moving.  Patients daughter assist Rontrell with bADLs.  Eldar's appetite is fair though he does "forget he is hungry" per his daughter and after he is exposed to a meal he tends to consume it in it's entirety.   Palliative Symptoms:  Falls - These have been frequent in his home.   Polypharmacy - Patients daughter is concerned his home medications are contributing to falls.   Advance Directives:  A detailed discussion was had today regarding advanced directives.  Patient does not have advanced directives and per his daughter Damaree wife who is present has some memory impairment of her own.  Code Status:  Concepts specific to code status, artifical feeding and hydration, continued IV antibiotics and rehospitalization was had.  The difference between a aggressive medical intervention path  and a palliative comfort care path for this patient at this time was had.   Encouraged patient/family to consider DNR/DNI status understanding evidenced based poor outcomes in similar hospitalized patient, as the cause of arrest is likely associated with advanced chronic/terminal illness rather than an easily reversible acute cardio-pulmonary event. I explained that DNR/DNI does not change the medical plan and it only comes into effect after a  person has arrested (died).  It is a protective measure to keep [place] from harming the patient in their last moments of life. Patients daughter and wife were agreeable to DNR/DNI with understanding that patient would not receive CPR, defibrillation, ACLS medications, or intubation.   A MOST form was provided.  Patients daughter plans to complete this and return it in the near future.  Discussion:  Open and honest conversations were held in the setting of Maveric's declining health. His daughter shares that his dementia has existed since he was about 57. He often has shuffling gait, poor balance, and often hallucinates. We discussed the various dementia types. Huan has never had a formal neuropsychiatry evaluation for more clarity on what type he has though we reviewed he could have a mixed dementia of LBD and vascular. WE discussed the importance of a Geriatrician to further help Jahbari short term and long term. Patients daughter is looking into the PACE program.   We discussed Desmon's current disease burden inclusive of heart failure. We reviewed this in the underlying context of his CAD. We reviewed the concern for poor long term outcomes. Discussed allowing time to medically optimized though the relationship between an advancing dementia and management of underlying diseases.  Reviewed best case and worst case scenarios.  We reviewed when hospice would be an appropriate consideration and discussed the differences between hospice and palliative care as below:   Palliative care is specialized medical care for people living with a serious illness, such as cancer, heart failure, COPD, Alzheimer's dementia, etc. Patients in palliative care may receive medical care for their symptoms, or palliative care, along with treatment intended to cure their serious illness   Hospice care focuses on the care, comfort, and quality of life of a person with a serious illness who is approaching the end of life. At some point, it may not be possible to cure a serious illness, or a patient may choose not to undergo certain treatments. Hospice is designed for this situation.   At this time goals of patients family are to medically optimize. Patients daughter realizes the possible barriers long term and plans to take it as it comes.  Education was provided.   Discussed the importance of continued conversation with family and their  medical providers regarding overall plan of care and treatment options, ensuring decisions are within the context of the patients values and GOCs.  Decision Maker: Estrellita Ludwig (Daughter): [phone] (Mobile)   SUMMARY OF RECOMMENDATIONS   DNAR/DNI  Plan to complete MOST prior to discharge  Open and honest conversations held in the setting of patients advanced dementia and heart disease  Goal is to medically optimize  Symptoms as below  Appreciate TOC helping patients daughter find a Music therapist  Ongoing PMT support  Code Status/Advance Care Planning: DNAR/DNI  Symptom Management:  Falls: - PT/OT Evaluation - Medication review as below  Polypharmacy: - Appreciate pharmacy consult to see if we can eliminate some pill burden  Palliative Prophylaxis:  Aspiration, Bowel Regimen, Delirium Protocol, Frequent Pain Assessment, Oral Care, Palliative Wound Care, and Turn Reposition  Additional Recommendations (Limitations, Scope, Preferences): Continue current care - patient would not want long term resuscitative efforts  Psycho-social/Spiritual:  Desire for further Chaplaincy support: Not presently Additional Recommendations: Education on chronic disease   Prognosis: Limited overall. High 12 month mortality risk.   Discharge Planning: Discharge plan to be determined.   Vitals:   03/14/23 0652 03/14/23 0700  BP: (!) 99/52 (!) 109/54  Pulse: 81 79  Resp: 13 14  Temp:    SpO2: 96% 99%    Intake/Output Summary (Last 24 hours) at 03/14/2023 2440 Last data filed at 03/14/2023 0700 Gross per 24 hour  Intake 1561.21 ml  Output 1650 ml  Net -88.79 ml   Last Weight  Most recent update: 03/14/2023  5:05 AM    Weight  66.5 kg (146 lb 9.7 oz)            Gen: Elderly Caucasian M chronically ill appearing HEENT: moist mucous membranes CV: Regular rate and  irregular rhythm  PULM:  On RA, breathing is even and nonlabored ABD: soft/nontender  EXT: (+) BLUE ecchymosis Neuro: Alert and oriented x1  PPS: 60%   This conversation/these recommendations were discussed with patient primary care team, Dr. Denese Killings  Total Time: 43 Billing based on MDM: High  Problems Addressed: One acute or chronic illness or injury that poses a threat to life or bodily function  Amount and/or Complexity of Data: Category 3:Discussion of management or test interpretation with external physician/other qualified health care professional/appropriate source (not separately reported)  Risks: Decision regarding hospitalization or escalation of hospital care and Decision not to resuscitate or to de-escalate care because of poor prognosis ______________________________________________________ Lamarr Lulas [place] Palliative Medicine Team Team Cell Phone: [phone] Please utilize secure chat with additional questions, if there is no response within 30 minutes please call the above phone number  Palliative Medicine Team providers are available by phone from 7am to 7pm daily and can be reached through the team cell phone.  Should this patient require assistance outside of these hours, please call the patient's attending physician.

## 2023-03-14 NOTE — Progress Notes (Signed)
Rounding Note    Patient Name: [name] Date of Encounter: 03/14/2023  [facility] Cardiologist: [name], MD   Subjective   89 yo admitted with hypoglycemia  Hx of CAD Known HFrEF with EF 25% by last echo  Repeat echo has been ordered   Remains confused   Has acute kidney injury - Creatinine is 2.21 on Oct. 12       Inpatient Medications    Scheduled Meds:  atorvastatin  40 mg Oral q AM   Chlorhexidine Gluconate Cloth  6 each Topical Daily   feeding supplement  237 mL Oral BID BM   folic acid  1 mg Oral Daily   heparin injection (subcutaneous)  5,000 Units Subcutaneous Q8H   levothyroxine  125 mcg Oral Q0600   mometasone-formoterol  2 puff Inhalation BID   montelukast  10 mg Oral QHS   pantoprazole  40 mg Oral BID   sodium chloride flush  10 mL Intravenous Q12H   Continuous Infusions:  norepinephrine (LEVOPHED) Adult infusion 3 mcg/min (03/14/23 0700)   PRN Meds: acetaminophen, calcium carbonate, docusate sodium, ipratropium-albuterol, mouth rinse, polyethylene glycol   Vital Signs    Vitals:   03/14/23 0652 03/14/23 0700 03/14/23 0753 03/14/23 0758  BP: (!) 99/52 (!) 109/54    Pulse: 81 79 84   Resp: 13 14 18    Temp:    98.5 F (36.9 C)  TempSrc:    Oral  SpO2: 96% 99%    Weight:      Height:        Intake/Output Summary (Last 24 hours) at 03/14/2023 0835 Last data filed at 03/14/2023 0700 Gross per 24 hour  Intake 1187.29 ml  Output 1650 ml  Net -462.71 ml      03/14/2023    4:45 AM 03/13/2023    1:09 AM 12/22/2022    1:20 PM  Last 3 Weights  Weight (lbs) 146 lb 9.7 oz 151 lb 10.8 oz 151 lb  Weight (kg) 66.5 kg 68.8 kg 68.493 kg      Telemetry    Sinus rhythm, sinus tachycardia with PVCs  - Personally Reviewed  ECG     - Personally Reviewed  Physical Exam   GEN: elderly male,  agitated  Neck: No JVD Cardiac: RRR, no murmurs, rubs, or gallops.  Respiratory: Clear to auscultation bilaterally. GI:  Soft, nontender, non-distended  MS:  1-2 + leg edema  Neuro:  Nonfocal  Psych: Normal affect   Labs    High Sensitivity Troponin:   Recent Labs  Lab 03/12/23 1606 03/12/23 1847  TROPONINIHS 17 18*     Chemistry Recent Labs  Lab 03/12/23 1606 03/12/23 1620 03/13/23 0039 03/13/23 0320  NA 134* 138 132* 130*  K 4.2 4.5 4.4 3.9  CL 100 102 98 98  CO2 24  --  23 25  GLUCOSE 29* 25* 76 118*  BUN 73* 67* 61* 59*  CREATININE 2.81* 3.10* 2.34* 2.21*  CALCIUM 9.0  --  8.5* 8.4*  MG 1.8  --   --  1.6*  PROT 9.1*  --   --   --   ALBUMIN 3.6  --   --   --   AST 23  --   --   --   ALT 11  --   --   --   ALKPHOS 46  --   --   --   BILITOT 0.6  --   --   --  GFRNONAA 21*  --  26* 28*  ANIONGAP 10  --  11 7    Lipids No results for input(s): "CHOL", "TRIG", "HDL", "LABVLDL", "LDLCALC", "CHOLHDL" in the last 168 hours.  Hematology Recent Labs  Lab 03/12/23 1606 03/12/23 1620 03/13/23 0320  WBC 10.3  --  8.5  RBC 3.00*  --  2.52*  HGB 9.9* 10.2* 8.4*  HCT 31.6* 30.0* 25.8*  MCV 105.3*  --  102.4*  MCH 33.0  --  33.3  MCHC 31.3  --  32.6  RDW 21.1*  --  21.2*  PLT 269  --  203   Thyroid  Recent Labs  Lab 03/13/23 0039 03/13/23 1850  TSH 14.748*  --   FREET4  --  0.66    BNPNo results for input(s): "BNP", "PROBNP" in the last 168 hours.  DDimer No results for input(s): "DDIMER" in the last 168 hours.   Radiology    US Abdomen Limited RUQ (LIVER/GB)  Result Date: 03/14/2023 CLINICAL DATA:  Cirrhosis. EXAM: ULTRASOUND ABDOMEN LIMITED RIGHT UPPER QUADRANT COMPARISON:  January 20, 2005 FINDINGS: Gallbladder: The gallbladder is not visualized. Common bile duct: Diameter: 3.7 mm Liver: No focal lesion identified. Diffusely increased echogenicity of the liver parenchyma is noted. Portal vein is patent on color Doppler imaging with normal direction of blood flow towards the liver. Other: A small right pleural effusion is suspected. IMPRESSION: 1. Nonvisualization of the  gallbladder. Correlation with the patient's surgical history is recommended to exclude prior cholecystectomy. 2. Hepatic steatosis without focal liver lesions. 3. Small right pleural effusion. Electronically Signed   By: Aram Candela M.D.   On: 03/14/2023 00:18   CT Head Wo Contrast  Result Date: 03/12/2023 CLINICAL DATA:  Found lying on back by daughter. Patient states he fell. Denies loss of consciousness. Right arm pain. Low blood pressure. EXAM: CT HEAD WITHOUT CONTRAST TECHNIQUE: Contiguous axial images were obtained from the base of the skull through the vertex without intravenous contrast. RADIATION DOSE REDUCTION: This exam was performed according to the departmental dose-optimization program which includes automated exposure control, adjustment of the mA and/or kV according to patient size and/or use of iterative reconstruction technique. COMPARISON:  CT head 05/09/2015 FINDINGS: Brain: No intracranial hemorrhage, mass effect, or evidence of acute infarct. No hydrocephalus. No extra-axial fluid collection. Age-commensurate cerebral atrophy and chronic small vessel ischemic disease. Vascular: No hyperdense vessel. Intracranial arterial calcification. Skull: No fracture or focal lesion. Sinuses/Orbits: Questionable proptosis. Enlargement of the bilateral inferior and medial rectus muscles. Other: None. IMPRESSION: 1. No acute intracranial abnormality. 2. 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 Electronically Signed   By: Minerva Fester M.D.   On: 03/12/2023 21:39   DG Humerus Right  Result Date: 03/12/2023 CLINICAL DATA:  Fall.  Dizziness.  Hypotension. EXAM: RIGHT HUMERUS - 2+ VIEW COMPARISON:  Elbow radiographs from 12/09/2018 FINDINGS: No humeral fracture or malalignment at the glenohumeral joint. Incidental IV tubing in the left antecubital region. IMPRESSION: 1. No humeral fracture or malalignment at the glenohumeral joint. Electronically Signed   By: Gaylyn Rong M.D.   On:  03/12/2023 18:37   DG Chest Portable 1 View  Result Date: 03/12/2023 CLINICAL DATA:  Fall, dizziness.  Hypotension. EXAM: PORTABLE CHEST 1 VIEW COMPARISON:  Chest x-ray November 23, 2020. FINDINGS: Small right pleural effusion. No confluent consolidation. Central pulmonary vascular congestion. Similar cardiomediastinal silhouette. Patient rotation. Left subclavian approach cardiac rhythm maintenance device. No acute bony abnormality. IMPRESSION: Small right pleural effusion and central pulmonary vascular congestion. Electronically Signed  By: Feliberto Harts M.D.   On: 03/12/2023 18:33    Cardiac Studies      Patient Profile     89 y.o. male    Assessment & Plan    1.  Chronic combined systolic and diastolic congestive heart failure: Patient has known ejection fraction of 25%.  Repeat echo has been ordered.  He is not having any signs or symptoms of shortness of breath.  He has chronic leg edema which has been present for years.  Will review echo later today.  2.  Hyperlipidemia: Continue atorvastatin.          For questions or updates, please contact [facility] Please consult [email or web address] for contact info under        Signed, Kristeen Miss, MD  03/14/2023, 8:35 AM

## 2023-03-14 NOTE — Progress Notes (Addendum)
NAME:  [name], MRN:  [id number], DOB:  April 04, 1934, LOS: 2 ADMISSION DATE:  03/12/2023, CONSULTATION DATE:  10/11 REFERRING MD:  Dr. Suezanne Jacquet, CHIEF COMPLAINT:  hypoglycemia   History of Present Illness:  Patient is a 89 yo M w/ pertinent PMH aflutter w/ ICD in place, CAD s/p stent x2, chronic systolic chf (EF 46-27% w/ grade one diastolic dysfx), mild dementia, PAD s/p b/l common iliac stenting 2019 COPD, asthma, t2dm, hld, htn, hypothyroidism present to [facility] ED on 10/11 after lightheadedness/fall.  Patient last seen by cardiology on June 2024. BP borderline low but kept on metoprolol. Per family patient has poor appetite. Having several episodes of lightheadedness when he gets up. On 10/11 patient became dizzy when getting up from chair and fell on his right elbow. Denies any LOC. Patient daughter heard fall and ems transported to [facility] ED. On arrival BP 125/60 and afebrile. Given IV fluids. CBG 29 given dextrose and started on d10 drip. CT head no acute abnormality. CXR w/ small right pleural effusion. Elbow xray negative for acute fracture and gauze dressing placed over skin tear. Creat 2.81 (baseline 1.8). EKG w/ paced rhythm w/ RBBB. Cardiology consulted suspect related to LV pacing. Believe patient's dizziness is related to dehydration and orthostatic hypotension. Recommend transfer to [facility]. Patient remained hypoglycemic despite being on d10 drip and bp becoming more hypotensive w/ maps in 60s. PCCM consulted for icu admission.   According to the patient's family he has had issues with orthostatic hypotension for some time.  They feel that his memory issues have been worsening.  Lack of interest in eating has also increased.  Pertinent  Medical History   Past Medical History:  Diagnosis Date   Atrial flutter (HCC)    (I could not find documentation of this rhythm.)   AUTOMATIC IMPLANTABLE CARDIAC DEFIBRILLATOR SITU    Automatic implantable cardioverter-defibrillator in situ 12/05/2009    Qualifier: Diagnosis of  By: Ladona Ridgel, MD, [facility], [place]ia Alcon    CAD (coronary artery disease) 05/22/2011   Carotid stenosis 05/22/2011   CHF CONGESTIVE HEART FAILURE    45% by echo 2015   COPD 12/01/2007   Qualifier: Diagnosis of  By: Clent Ridges NP, [name]     Dementia (HCC)    per pt's stepdaughter   DM2 (diabetes mellitus, type 2) (HCC)    pt's stepdaughter said he is no longer Diabetic, and does not take medication for it   DYSLIPIDEMIA    DYSPNEA    Edema    Essential hypertension 11/17/2007   Qualifier: Diagnosis of  By: Vernie Murders     GERD (gastroesophageal reflux disease)    Gout    HYPERTENSION    HYPOTHYROIDISM    MYOCARDIAL INFARCTION    MI 1999, stent x 2 to RCA, 70% circ, 30 and 40 % LADs   S/P updgrade to CRT-D 11/23/2020   WEIGHT GAIN, ABNORMAL      Significant Hospital Events: Including procedures, antibiotic start and stop dates in addition to other pertinent events   10/11 admitted after fall; found to be hypoglycemic; bp soft  Interim History / Subjective:  Foley placed for retention yesterday.  Unable to come off norepinephrine entirely.  Blood pressure dropping into the 80s systolic.  He is more confused  Objective   Blood pressure (!) 144/55, pulse 84, temperature 98.5 F (36.9 C), temperature source Oral, resp. rate 14, height 5\' 8"  (1.727 m), weight 66.5 kg, SpO2 98%.        Intake/Output Summary (  Last 24 hours) at 03/14/2023 0853 Last data filed at 03/14/2023 0700 Gross per 24 hour  Intake 1187.29 ml  Output 1650 ml  Net -462.71 ml   Filed Weights   03/13/23 0109 03/14/23 0445  Weight: 68.8 kg 66.5 kg    Examination: General:  elderly male in NAD HEENT: MM pink/moist Neuro: Lying in bed follows commands. CV: s1s2, paced rhythm w/ rates 80s, no m/r/g, JVP 2 cm above sternal angle PULM:  dim clear BS bilaterally GI: soft, bsx4 active  Extremities: warm/dry, ble edema +2, much improved following compression stockings. Skin: no rashes  or lesions    Ancillary test personally reviewed  TSH 14 but free T4 0.66 Sodium 130, creatinine 2.21, magnesium 1.6 Moderate microcytic anemia at 8.4 Troponin 18, procalcitonin negative Echocardiogram today personally reviewed.  EF appears similar to previous reports.  IVC appears collapsed. Ultrasound shows hepatic state cyst but no evidence of cirrhosis. Blood sugar in acceptable range. Assessment & Plan:   Hypotension and hypoglycemia in the context of progressive dementia with poor oral intake and known HFrEF with ICD in place. Syncopal episode yesterday with no evidence of dysrhythmia on pacemaker/ICD interrogation. History of orthostatic hypotension and gait instability per family. Hypoglycemia on admission now resolved. No history of diabetes per family. Atrial flutter CKD stage III. COPD. Hypothyroidism. Prior history of dyslipidemia Prior history of hypertension GERD Urinary retention likely BPH   Plan:  -Wean norepinephrine to keep systolic blood pressure greater than 100 -Given small IVC and ongoing pressor requirements, will challenge with albumin.  Patient remains on room air so unlikely to have significant pulmonary edema. -Holding GDMT at this time.  He may no longer be able to tolerate GDMT.  Will start midodrine. -Compression stockings to improve venous return. -Continue to encourage oral intake -Discussed with family the multifactorial nature of dizziness and loss of appetite in the elderly and that there may not be a simple solution for these problems and that they will complicate management of his heart failure in the long run. -Palliative care consultation today.  Patient is now DNR DNI.  Will continue with current care. -Currently calm so have not started psychotropic medication.  I hope to be able to get him out of the ICU tomorrow now that I have started midodrine. -Continue to monitor blood sugar. -Possible euthyroid sick versus subclinical  hypothyroidism.  Given hypotension and general slowing down, will increase levothyroxine slightly.  Check TSH in 1 week -Foley in place until patient is able to stand up then try upright voiding.Marland Kitchen  Best Practice (right click and "Reselect all SmartList Selections" daily)   Diet/type: Regular diet DVT prophylaxis: prophylactic heparin  GI prophylaxis: PPI Lines: N/A Foley:  N/A Code Status:  full code Last date of multidisciplinary goals of care discussion [10/13 family updated at the bedside.  CRITICAL CARE Performed by: Lynnell Catalan   Total critical care time: 34 minutes  Critical care time was exclusive of separately billable procedures and treating other patients.  Critical care was necessary to treat or prevent imminent or life-threatening deterioration.  Critical care was time spent personally by me on the following activities: development of treatment plan with patient and/or surrogate as well as nursing, discussions with consultants, evaluation of patient's response to treatment, examination of patient, obtaining history from patient or surrogate, ordering and performing treatments and interventions, ordering and review of laboratory studies, ordering and review of radiographic studies, pulse oximetry, re-evaluation of patient's condition and participation in multidisciplinary rounds.  Daleen Bo  Denese Killings, MD [facility] ICU Physician [facility]  Pager: [phone] Mobile: [phone] After hours: [phone].

## 2023-03-14 NOTE — Progress Notes (Signed)
Echocardiogram 2D Echocardiogram has been performed.  Warren Lacy Shakeria Robinette RDCS 03/14/2023, 1:23 PM

## 2023-03-15 DIAGNOSIS — R627 Adult failure to thrive: Secondary | ICD-10-CM | POA: Diagnosis not present

## 2023-03-15 DIAGNOSIS — I959 Hypotension, unspecified: Secondary | ICD-10-CM | POA: Diagnosis not present

## 2023-03-15 DIAGNOSIS — Z7189 Other specified counseling: Secondary | ICD-10-CM | POA: Diagnosis not present

## 2023-03-15 DIAGNOSIS — R55 Syncope and collapse: Secondary | ICD-10-CM

## 2023-03-15 DIAGNOSIS — Z515 Encounter for palliative care: Secondary | ICD-10-CM | POA: Diagnosis not present

## 2023-03-15 LAB — BASIC METABOLIC PANEL
Anion gap: 11 (ref 5–15)
BUN: 46 mg/dL — ABNORMAL HIGH (ref 8–23)
CO2: 21 mmol/L — ABNORMAL LOW (ref 22–32)
Calcium: 9.1 mg/dL (ref 8.9–10.3)
Chloride: 102 mmol/L (ref 98–111)
Creatinine, Ser: 1.58 mg/dL — ABNORMAL HIGH (ref 0.61–1.24)
GFR, Estimated: 42 mL/min — ABNORMAL LOW (ref >60)
Glucose, Bld: 122 mg/dL — ABNORMAL HIGH (ref 70–99)
Potassium: 5 mmol/L (ref 3.5–5.1)
Sodium: 134 mmol/L — ABNORMAL LOW (ref 135–145)

## 2023-03-15 LAB — GLUCOSE, CAPILLARY
Glucose-Capillary: 107 mg/dL — ABNORMAL HIGH (ref 70–99)
Glucose-Capillary: 96 mg/dL (ref 70–99)
Glucose-Capillary: 137 mg/dL — ABNORMAL HIGH (ref 70–99)
Glucose-Capillary: 166 mg/dL — ABNORMAL HIGH (ref 70–99)

## 2023-03-15 MED ORDER — MIDODRINE HCL 5 MG PO TABS
10.0000 mg | ORAL_TABLET | Freq: Three times a day (TID) | ORAL | Status: DC
  Administered 2023-03-15 – 2023-03-18 (×9): 10 mg via ORAL
  Filled 2023-03-15 (×9): qty 2

## 2023-03-15 MED ORDER — TAMSULOSIN HCL 0.4 MG PO CAPS
0.4000 mg | ORAL_CAPSULE | Freq: Every day | ORAL | Status: DC
  Administered 2023-03-15 – 2023-03-18 (×4): 0.4 mg via ORAL
  Filled 2023-03-15 (×4): qty 1

## 2023-03-15 NOTE — Progress Notes (Signed)
Heart Failure Navigator Progress Note  Assessed for Heart & Vascular TOC clinic readiness.  Patient does not meet criteria due to History of Dementia.   Navigator will sign off at this time.  Roxy Horseman, RN, BSN [facility] Heart Failure Navigator Secure Chat Only

## 2023-03-15 NOTE — Progress Notes (Signed)
   This referral was received for hospice services at home. I have reviewed chart and spoke to the daughter. Who was not at bedside. She is in agreement with hospice services and does want to pursue this at home. We did discuss the difficult decision of deactivating the ICD and why we would recommend this at this time. She is not ready to make this decision and needs more time to think about this. I understand this it is a hard decision to have to make at this time.  She also reports that she will want a hospital bed for the pt however needs a day to move some furniture around at home and get his current bed into storage.    This pt has been approved for hospice services at home by our MD.   I have updated CM Alesia.   Norm Parcel RN [phone]

## 2023-03-15 NOTE — TOC Initial Note (Signed)
Transition of Care [facility]) - Initial/Assessment Note    Patient Details  Name: [name] MRN: [id number] Date of Birth: 1934/01/24  Transition of Care [facility]) CM/SW Contact:    Elliot Cousin, RN Phone Number: [phone] 03/15/2023, 1:50 PM   Clinical Narrative: CM spoke to pt, wife and dtr, Elease Hashimoto in the room. Spoke to dtr, Elease Hashimoto and offered choice for [employer] (medicare.gov list placed in chart). Dtr agreeable to [facility] [place]. Contacted [facility] rep, Cheri with new referral. Dtr states they will need hospital bed.   Home address [address], [place] [address]  Provided to Home Hospice address for home. Will need PTAR paperwork. DNR on chart.                  Expected Discharge Plan: Home w Hospice Care Barriers to Discharge: Continued Medical Work up   Patient Goals and CMS Choice Patient states their goals for this hospitalization and ongoing recovery are:: wants patient to be safe and comfortable at home CMS Medicare.gov Compare Post Acute Care list provided to:: Patient Represenative (must comment) (Daughter- Estrellita Ludwig) Choice offered to / list presented to : Adult Children      Expected Discharge Plan and Services   Discharge Planning Services: CM Consult Post Acute Care Choice: Hospice Living arrangements for the past 2 months: Single Family Home                           HH Arranged: RN [facility] Agency: [facility] Date HH Agency Contacted: 03/15/23 Time HH Agency Contacted: [phone] Representative spoke with at [facility] Agency: Norm Parcel  Prior Living Arrangements/Services Living arrangements for the past 2 months: Single Family Home Lives with:: Spouse, Adult Children Patient language and need for interpreter reviewed:: Yes Do you feel safe going back to the place where you live?: Yes      Need for Family Participation in Patient Care: Yes (Comment) Care giver support system in place?: Yes  (comment) Current home services: DME (rolling walker, cane, shower bench) Criminal Activity/Legal Involvement Pertinent to Current Situation/Hospitalization: No - Comment as needed  Activities of Daily Living   ADL Screening (condition at time of admission) Independently performs ADLs?: No Does the patient have a NEW difficulty with bathing/dressing/toileting/self-feeding that is expected to last >3 days?: No Does the patient have a NEW difficulty with getting in/out of bed, walking, or climbing stairs that is expected to last >3 days?: No Does the patient have a NEW difficulty with communication that is expected to last >3 days?: No Is the patient deaf or have difficulty hearing?: Yes Does the patient have difficulty seeing, even when wearing glasses/contacts?: Yes Does the patient have difficulty concentrating, remembering, or making decisions?: Yes  Permission Sought/Granted Permission sought to share information with : Case Manager, Family Supports, PCP Permission granted to share information with : Yes, Verbal Permission Granted  Share Information with NAME: Estrellita Ludwig  Permission granted to share info w AGENCY: Home Hospice  Permission granted to share info w Relationship: daughter  Permission granted to share info w Contact Information: [phone]  Emotional Assessment Appearance:: Appears stated age Attitude/Demeanor/Rapport: Lethargic   Orientation: : Oriented to Self   Psych Involvement: No (comment)  Admission diagnosis:  Dizziness [R42] Hypoglycemia [E16.2] EKG abnormalities [R94.31] Generalized weakness [R53.1] Fall in home, initial encounter [W19.Lorne Skeens, Y92.009] Patient Active Problem List   Diagnosis Date Noted  Failure to thrive in adult 03/15/2023   Advanced care planning/counseling discussion 03/15/2023   Hypotension 03/13/2023   Dizziness 03/13/2023   Acute on chronic combined systolic and diastolic CHF (congestive heart failure) (HCC) 03/12/2023    ECG abnormality 03/12/2023   Hypoglycemia 03/12/2023   Dyslipidemia, goal LDL below 100 09/22/2022   Long toenail 09/22/2022   Anemia due to zinc deficiency 06/29/2022   Stage 3b chronic kidney disease (HCC) 06/23/2022   Extrinsic asthma 07/16/2021   HFrEF (heart failure with reduced ejection fraction) (HCC) 11/22/2020   PAD (peripheral artery disease) (HCC) 04/05/2018   Diminished pulses in lower extremity 02/01/2018   Ischemic cardiomyopathy 03/08/2017   Blepharitis of upper and lower eyelids of both eyes 12/16/2016   Hypertropia of right eye 12/16/2016   Hypotropia of left eye 09/08/2016   Pseudophakia of both eyes 07/22/2016   Diplopia 04/06/2016   NSVT (nonsustained ventricular tachycardia) (HCC) 04/19/2014   Carotid stenosis 05/22/2011   CAD (coronary artery disease) 05/22/2011   Automatic implantable cardioverter-defibrillator in situ 12/05/2009   AICD (automatic cardioverter/defibrillator) present 12/05/2009   Hypothyroidism 04/29/2009   Other specified chronic obstructive pulmonary disease (HCC) 12/01/2007   Essential hypertension 11/17/2007   MYOCARDIAL INFARCTION 11/17/2007   Chronic systolic congestive heart failure (HCC) 11/17/2007   PCP:  Etta Grandchild, MD Pharmacy:   [facility] [address] [place] - [address] [place] [address] Phone: [phone] Fax: [phone]  [id number] - [place], [place] - [address] AT [facility] [address] [place] [address] Phone: [phone] Fax: [phone]     Social Determinants of Health (SDOH) Social History: SDOH Screenings   Food Insecurity: No Food Insecurity (06/15/2022)  Housing: Low Risk  (06/11/2022)  Transportation Needs: No Transportation Needs (06/15/2022)  Utilities: Not At Risk (06/11/2022)  Depression (PHQ2-9): Medium Risk (06/11/2022)  Social Connections: Unknown (09/28/2021)   Received from [facility]  Tobacco Use:  Medium Risk (03/12/2023)   SDOH Interventions:     Readmission Risk Interventions    06/12/2022    3:15 PM  Readmission Risk Prevention Plan  Transportation Screening Complete  PCP or Specialist Appt within 5-7 Days Complete  Home Care Screening Complete  Medication Review (RN CM) Complete

## 2023-03-15 NOTE — Progress Notes (Signed)
Rounding Note    Patient Name: [name] Date of Encounter: 03/15/2023  [facility] Cardiologist: [name], MD   Subjective   Confused no chest pain    Inpatient Medications    Scheduled Meds:  atorvastatin  40 mg Oral q AM   Chlorhexidine Gluconate Cloth  6 each Topical Daily   feeding supplement  237 mL Oral BID BM   folic acid  1 mg Oral Daily   heparin injection (subcutaneous)  5,000 Units Subcutaneous Q8H   levothyroxine  150 mcg Oral Q0600   midodrine  5 mg Oral TID WC   mometasone-formoterol  2 puff Inhalation BID   montelukast  10 mg Oral QHS   pantoprazole  40 mg Oral BID   sodium chloride flush  10 mL Intravenous Q12H   Continuous Infusions:  norepinephrine (LEVOPHED) Adult infusion Stopped (03/14/23 1505)   PRN Meds: acetaminophen, calcium carbonate, docusate sodium, ipratropium-albuterol, mouth rinse, polyethylene glycol   Vital Signs    Vitals:   03/15/23 0300 03/15/23 0400 03/15/23 0500 03/15/23 0553  BP: (!) 105/55 (!) 139/39 (!) 128/48   Pulse: 83 88 84   Resp: 17 15 14    Temp:  98 F (36.7 C)    TempSrc:  Oral    SpO2: (!) 86% 98% 96%   Weight:    66 kg  Height:    5\' 8"  (1.727 m)    Intake/Output Summary (Last 24 hours) at 03/15/2023 0744 Last data filed at 03/15/2023 1610 Gross per 24 hour  Intake 880.96 ml  Output 1250 ml  Net -369.04 ml      03/15/2023    5:53 AM 03/14/2023    4:45 AM 03/13/2023    1:09 AM  Last 3 Weights  Weight (lbs) 145 lb 8.1 oz 146 lb 9.7 oz 151 lb 10.8 oz  Weight (kg) 66 kg 66.5 kg 68.8 kg      Telemetry    Sinus rhythm, sinus tachycardia with PVCs  - Personally Reviewed  ECG     - Personally Reviewed  Physical Exam   GEN: elderly male,  agitated  Neck: No JVD Cardiac: SEM AICD under left clavicle  Respiratory: Clear to auscultation bilaterally. GI: Soft, nontender, non-distended  MS:  1-2 + leg edema  Neuro:  Nonfocal  Psych: Normal affect   Labs    High  Sensitivity Troponin:   Recent Labs  Lab 03/12/23 1606 03/12/23 1847  TROPONINIHS 17 18*     Chemistry Recent Labs  Lab 03/12/23 1606 03/12/23 1620 03/13/23 0320 03/14/23 1537 03/15/23 0537  NA 134*   < > 130* 135 134*  K 4.2   < > 3.9 4.9 5.0  CL 100   < > 98 102 102  CO2 24   < > 25 23 21*  GLUCOSE 29*   < > 118* 152* 122*  BUN 73*   < > 59* 48* 46*  CREATININE 2.81*   < > 2.21* 1.76* 1.58*  CALCIUM 9.0   < > 8.4* 9.0 9.1  MG 1.8  --  1.6*  --   --   PROT 9.1*  --   --   --   --   ALBUMIN 3.6  --   --   --   --   AST 23  --   --   --   --   ALT 11  --   --   --   --   ALKPHOS 46  --   --   --   --  BILITOT 0.6  --   --   --   --   GFRNONAA 21*   < > 28* 37* 42*  ANIONGAP 10   < > 7 10 11    < > = values in this interval not displayed.    Lipids No results for input(s): "CHOL", "TRIG", "HDL", "LABVLDL", "LDLCALC", "CHOLHDL" in the last 168 hours.  Hematology Recent Labs  Lab 03/12/23 1606 03/12/23 1620 03/13/23 0320 03/14/23 1537  WBC 10.3  --  8.5 6.9  RBC 3.00*  --  2.52* 2.47*  HGB 9.9* 10.2* 8.4* 8.0*  HCT 31.6* 30.0* 25.8* 24.8*  MCV 105.3*  --  102.4* 100.4*  MCH 33.0  --  33.3 32.4  MCHC 31.3  --  32.6 32.3  RDW 21.1*  --  21.2* 21.3*  PLT 269  --  203 184   Thyroid  Recent Labs  Lab 03/13/23 0039 03/13/23 1850  TSH 14.748*  --   FREET4  --  0.66    BNP Recent Labs  Lab 03/14/23 1537  BNP 347.2*    DDimer No results for input(s): "DDIMER" in the last 168 hours.   Radiology    ECHOCARDIOGRAM COMPLETE  Result Date: 03/14/2023    ECHOCARDIOGRAM REPORT   Patient Name:   [facility] Date of Exam: 03/14/2023 Medical Rec #:  [id number]       Height:       68.0 in Accession #:    [id number]      Weight:       146.6 lb Date of Birth:  02-03-1934       BSA:          1.791 m Patient Age:    89 years        BP:           88/36 mmHg Patient Gender: M               HR:           96 bpm. Exam Location:  Inpatient Procedure: 2D Echo, Color Doppler  and Cardiac Doppler Indications:    I25.5 Ischemic cardiomyopathy  History:        Patient has prior history of Echocardiogram examinations, most                 recent 03/08/2018. CAD, Defibrillator, PAD and COPD; Risk                 Factors:Hypertension, Diabetes and Dyslipidemia.  Sonographer:    Irving Burton Senior RDCS Referring Phys: [id number] RAVI AGARWALA IMPRESSIONS  1. Left ventricular ejection fraction, by estimation, is 45 to 50%. The left ventricle has mildly decreased function. The left ventricle demonstrates regional wall motion abnormalities (see scoring diagram/findings for description). Left ventricular diastolic parameters are indeterminate.  2. Right ventricular systolic function is mildly reduced. The right ventricular size is normal. Tricuspid regurgitation signal is inadequate for assessing PA pressure.  3. Left atrial size was mildly dilated.  4. The mitral valve is grossly normal. Trivial mitral valve regurgitation. No evidence of mitral stenosis.  5. The aortic valve is abnormal. There is moderate calcification of the aortic valve. Aortic valve regurgitation is trivial. Aortic valve sclerosis/calcification is present, without any evidence of aortic stenosis.  6. The inferior vena cava is normal in size with greater than 50% respiratory variability, suggesting right atrial pressure of 3 mmHg. FINDINGS  Left Ventricle: Left ventricular ejection fraction, by estimation, is 45 to 50%. The left ventricle  has mildly decreased function. The left ventricle demonstrates regional wall motion abnormalities. The left ventricular internal cavity size was normal in size. There is no left ventricular hypertrophy. Left ventricular diastolic parameters are indeterminate.  LV Wall Scoring: The mid and distal lateral wall, inferior wall, and posterior wall are hypokinetic. Right Ventricle: The right ventricular size is normal. No increase in right ventricular wall thickness. Right ventricular systolic function is  mildly reduced. Tricuspid regurgitation signal is inadequate for assessing PA pressure. Left Atrium: Left atrial size was mildly dilated. Right Atrium: Right atrial size was normal in size. Pericardium: There is no evidence of pericardial effusion. Mitral Valve: The mitral valve is grossly normal. Trivial mitral valve regurgitation. No evidence of mitral valve stenosis. Tricuspid Valve: The tricuspid valve is normal in structure. Tricuspid valve regurgitation is trivial. No evidence of tricuspid stenosis. Aortic Valve: The aortic valve is abnormal. There is moderate calcification of the aortic valve. Aortic valve regurgitation is trivial. Aortic valve sclerosis/calcification is present, without any evidence of aortic stenosis. Aortic valve mean gradient measures 7.2 mmHg. Aortic valve peak gradient measures 14.8 mmHg. Aortic valve area, by VTI measures 1.37 cm. Pulmonic Valve: The pulmonic valve was normal in structure. Pulmonic valve regurgitation is not visualized. No evidence of pulmonic stenosis. Aorta: The ascending aorta was not well visualized. Venous: The inferior vena cava is normal in size with greater than 50% respiratory variability, suggesting right atrial pressure of 3 mmHg. IAS/Shunts: The atrial septum is grossly normal.  LEFT VENTRICLE PLAX 2D LVIDd:         5.00 cm LVIDs:         3.90 cm LV PW:         0.90 cm LV IVS:        0.80 cm LVOT diam:     1.80 cm LV SV:         45 LV SV Index:   25 LVOT Area:     2.54 cm  LV Volumes (MOD) LV vol d, MOD A2C: 101.0 ml LV vol d, MOD A4C: 134.0 ml LV vol s, MOD A2C: 57.6 ml LV vol s, MOD A4C: 70.0 ml LV SV MOD A2C:     43.4 ml LV SV MOD A4C:     134.0 ml LV SV MOD BP:      52.1 ml RIGHT VENTRICLE RV S prime:     12.20 cm/s TAPSE (M-mode): 1.5 cm LEFT ATRIUM             Index        RIGHT ATRIUM           Index LA diam:        4.30 cm 2.40 cm/m   RA Area:     10.70 cm LA Vol (A2C):   33.8 ml 18.87 ml/m  RA Volume:   21.20 ml  11.84 ml/m LA Vol (A4C):    65.5 ml 36.57 ml/m LA Biplane Vol: 47.2 ml 26.35 ml/m  AORTIC VALVE AV Area (Vmax):    1.32 cm AV Area (Vmean):   1.46 cm AV Area (VTI):     1.37 cm AV Vmax:           192.51 cm/s AV Vmean:          124.515 cm/s AV VTI:            0.324 m AV Peak Grad:      14.8 mmHg AV Mean Grad:      7.2 mmHg LVOT  Vmax:         99.70 cm/s LVOT Vmean:        71.500 cm/s LVOT VTI:          0.175 m LVOT/AV VTI ratio: 0.54  AORTA Ao Root diam: 3.30 cm  SHUNTS Systemic VTI:  0.18 m Systemic Diam: 1.80 cm Weston Brass MD Electronically signed by Weston Brass MD Signature Date/Time: 03/14/2023/2:03:57 PM    Final    US Abdomen Limited RUQ (LIVER/GB)  Result Date: 03/14/2023 CLINICAL DATA:  Cirrhosis. EXAM: ULTRASOUND ABDOMEN LIMITED RIGHT UPPER QUADRANT COMPARISON:  January 20, 2005 FINDINGS: Gallbladder: The gallbladder is not visualized. Common bile duct: Diameter: 3.7 mm Liver: No focal lesion identified. Diffusely increased echogenicity of the liver parenchyma is noted. Portal vein is patent on color Doppler imaging with normal direction of blood flow towards the liver. Other: A small right pleural effusion is suspected. IMPRESSION: 1. Nonvisualization of the gallbladder. Correlation with the patient's surgical history is recommended to exclude prior cholecystectomy. 2. Hepatic steatosis without focal liver lesions. 3. Small right pleural effusion. Electronically Signed   By: Aram Candela M.D.   On: 03/14/2023 00:18    Cardiac Studies      Patient Profile     89 y.o. male    Assessment & Plan    1.  Chronic combined systolic and diastolic congestive heart failure: prior EF 25% TTE this admission improved EF 45-50% he was azotemic and dry on admission Diuretic/ARNi held Cr now improved from 3.1 to 1.58.   2.  Hyperlipidemia: Continue atorvastatin.  3. CRT-  LV pacing with PVCls normal device interrogation   Patient is now DNR due to age and advanced dementia with failure to thrive poor PO intake  and fall risk with weight loss   Cardiology will sign off      For questions or updates, please contact [facility] Please consult [email or web address] for contact info under        Signed, Charlton Haws, MD  03/15/2023, 7:44 AM

## 2023-03-15 NOTE — Progress Notes (Signed)
NAME:  [name], MRN:  [id number], DOB:  1934-04-20, LOS: 3 ADMISSION DATE:  03/12/2023, CONSULTATION DATE:  10/11 REFERRING MD:  Dr. Suezanne Jacquet, CHIEF COMPLAINT:  hypoglycemia   History of Present Illness:  Patient is a 89 yo M w/ pertinent PMH aflutter w/ ICD in place, CAD s/p stent x2, chronic systolic chf (EF 77-82% w/ grade one diastolic dysfx), mild dementia, PAD s/p b/l common iliac stenting 2019 COPD, asthma, t2dm, hld, htn, hypothyroidism present to [facility] ED on 10/11 after lightheadedness/fall.  Patient last seen by cardiology on June 2024. BP borderline low but kept on metoprolol. Per family patient has poor appetite. Having several episodes of lightheadedness when he gets up. On 10/11 patient became dizzy when getting up from chair and fell on his right elbow. Denies any LOC. Patient daughter heard fall and ems transported to [facility] ED. On arrival BP 125/60 and afebrile. Given IV fluids. CBG 29 given dextrose and started on d10 drip. CT head no acute abnormality. CXR w/ small right pleural effusion. Elbow xray negative for acute fracture and gauze dressing placed over skin tear. Creat 2.81 (baseline 1.8). EKG w/ paced rhythm w/ RBBB. Cardiology consulted suspect related to LV pacing. Believe patient's dizziness is related to dehydration and orthostatic hypotension. Recommend transfer to [facility]. Patient remained hypoglycemic despite being on d10 drip and bp becoming more hypotensive w/ maps in 60s. PCCM consulted for icu admission.   According to the patient's family he has had issues with orthostatic hypotension for some time.  They feel that his memory issues have been worsening.  Lack of interest in eating has also increased.  Pertinent  Medical History   Past Medical History:  Diagnosis Date   Atrial flutter (HCC)    (I could not find documentation of this rhythm.)   AUTOMATIC IMPLANTABLE CARDIAC DEFIBRILLATOR SITU    Automatic implantable cardioverter-defibrillator in situ 12/05/2009    Qualifier: Diagnosis of  By: Ladona Ridgel, MD, [facility], Vergia Alcon    CAD (coronary artery disease) 05/22/2011   Carotid stenosis 05/22/2011   CHF CONGESTIVE HEART FAILURE    45% by echo 2015   COPD 12/01/2007   Qualifier: Diagnosis of  By: Clent Ridges NP, [name]     Dementia (HCC)    per pt's stepdaughter   DM2 (diabetes mellitus, type 2) (HCC)    pt's stepdaughter said he is no longer Diabetic, and does not take medication for it   DYSLIPIDEMIA    DYSPNEA    Edema    Essential hypertension 11/17/2007   Qualifier: Diagnosis of  By: Vernie Murders     GERD (gastroesophageal reflux disease)    Gout    HYPERTENSION    HYPOTHYROIDISM    MYOCARDIAL INFARCTION    MI 1999, stent x 2 to RCA, 70% circ, 30 and 40 % LADs   S/P updgrade to CRT-D 11/23/2020   WEIGHT GAIN, ABNORMAL      Significant Hospital Events: Including procedures, antibiotic start and stop dates in addition to other pertinent events   10/11 admitted after fall; found to be hypoglycemic; bp soft   Interim History / Subjective:  Has been drinking protein shakes but not eating.   Objective   Blood pressure (!) 74/43, pulse 61, temperature 98.8 F (37.1 C), resp. rate (!) 21, height 5\' 8"  (1.727 m), weight 66 kg, SpO2 98%.        Intake/Output Summary (Last 24 hours) at 03/15/2023 1139 Last data filed at 03/15/2023 0552 Gross per 24 hour  Intake 622.36 ml  Output 725 ml  Net -102.64 ml   Filed Weights   03/13/23 0109 03/14/23 0445 03/15/23 0553  Weight: 68.8 kg 66.5 kg 66 kg    Examination: General:  chronically ill frail appearing elderly man lying in bed in NAD HEENT: [place]/AT, eyes anicteric Neuro:  awake, alert, moving extremities CV:  S1S2, irreg rhythm, occasionally pacing PULM: breathing comfortably on RA, CTAB GI: soft, NT Extremities: +edema bilaterl LE Skin: no rashes, stasis dermatitis on both shins  Na+ 134 K+ 5.0 BUN 46 Cr 1.58 WBC 6.9 H/H 8/24.8 Platelets 184  Echo: LVEF  45-50%.  Indeterminate diastolic function Mildly dilated LA. Normal IVC with variability.   Resolved problem list:   Assessment & Plan:   Hypotension and hypoglycemia in the context of progressive dementia with poor oral intake and known HFrEF with ICD in place -weaned off NE, no plans to restart -hold oral hypotension meds> metoprolol -increase midodrine to 10 mg TID -NE to maintain SBP >100 -has pacing to prevent reflex brady with midodrine  Syncopal episode yesterday with no evidence of dysrhythmia on pacemaker/ICD  Interrogation> worry about hypovolemia as a cause History of orthostatic hypotension and gait instability per family History of chronic HFrEF -goal SBP >100 -will be a challenge if he will not orally maintain his hydration; this may be end stage dementia -not able to tolerate GDMT for heart failure -liberalized diet -refusing TED hose- he doesn't like them being tight  Hypoglycemia on admission now resolved. No history of diabetes per family -ad lib enteral intake  Atrial flutter AKI on CKD stage IIIb -strict I/O -renally dose meds, avoid nephrotoxic meds  COPD -con't dulera  Hypothyroidism; mildly uncontrolled. TSH ~15 -synthroid now at higher dose, which he was prescribed but not taking  Prior history of dyslipidemia -con't statin  Prior history of hypertension -holding metoprolol  GERD -PPI  Urinary retention likely BPH -start tamsulosin  Hypovolemic hyponatremia -avoid low salt diet -avoid hypovolemia  Severe protein energy malnutrition -vitamins -ad lib enteral nutrition  Appreciate palliative's management. Daughter asked me about hospice and I told her it is reasonable in this stage of dementia where he is hospitalized with FTT. Suspect he will continue to eat less and less and these issues will worsen. Daughter is at home with both parents who have dementia-- worry this is a non-sustainable situation long-term.   To med surg today, TRH to  assume care tomorrow morning.    Best Practice (right click and "Reselect all SmartList Selections" daily)   Diet/type: Regular diet DVT prophylaxis: prophylactic heparin  GI prophylaxis: PPI Lines: N/A Foley:  N/A Code Status:  full code Last date of multidisciplinary goals of care discussion [10/14 daughter, wife updated at the bedside.   Steffanie Dunn, DO 03/15/23 12:50 PM [facility]  For contact information, see Amion. If no response to pager, please call PCCM consult pager. After hours, 7PM- 7AM, please call Elink.

## 2023-03-15 NOTE — Progress Notes (Signed)
Palliative Medicine Inpatient Follow Up Note HPI: 89 yo M w/ pertinent PMH aflutter w/ ICD in place, CAD s/p stent x2, chronic systolic chf (EF 16-10% w/ grade one diastolic dysfx), mild dementia, PAD s/p b/l common iliac stenting 2019 COPD, asthma, t2dm, hld, htn, hypothyroidism. Admitted for fall was found to be hypoglycemic and hypotensive. Palliative care has been asked to get involved to further discuss code status and goals of care.   Today's Discussion 03/15/2023  *Please note that this is a verbal dictation therefore any spelling or grammatical errors are due to the "Dragon Medical One" system interpretation.  Chart reviewed inclusive of vital signs, progress notes, laboratory results, and diagnostic images.   I met with [name] at bedside this afternoon. He is awake and alert to self. He shares that he is not feeling terribly well today though is not elaborate.   I spoke with patients daughter, [name]. A MOST was completed as below:  Cardiopulmonary Resuscitation: Do Not Attempt Resuscitation (DNR/No CPR)  Medical Interventions: Comfort Measures: Keep clean, warm, and dry. Use medication by any route, positioning, wound care, and other measures to relieve pain and suffering. Use oxygen, suction and manual treatment of airway obstruction as needed for comfort. Do not transfer to the hospital unless comfort needs cannot be met in current location.  Antibiotics: Determine use of limitation of antibiotics when infection occurs  IV Fluids: No IV fluids (provide other measures to ensure comfort)  Feeding Tube: No feeding tube   Per [name]'s conversations with Dr. Chestine Spore she has determined that taking [name] home with hospice would be the next best step in his care. I shared that I fully support this decision based upon the progressive nature of his dementia and his adult FTT.   Created space and opportunity for patients daughter to explore thoughts feelings and fears regarding [name]'s  current medical situation. She shares peace and comfort in this decision.   Questions and concerns addressed/Palliative Support Provided.   Objective Assessment: Vital Signs Vitals:   03/15/23 1300 03/15/23 1400  BP: (!) 112/41 (!) 113/38  Pulse: 66 80  Resp: 13 (!) 22  Temp:    SpO2: 100% 98%    Intake/Output Summary (Last 24 hours) at 03/15/2023 1426 Last data filed at 03/15/2023 9604 Gross per 24 hour  Intake 490.01 ml  Output 725 ml  Net -234.99 ml   Last Weight  Most recent update: 03/15/2023  5:54 AM    Weight  66 kg (145 lb 8.1 oz)           Gen: Elderly Caucasian M chronically ill appearing HEENT: moist mucous membranes CV: Regular rate and irregular rhythm  PULM:  On RA, breathing is even and nonlabored ABD: soft/nontender  EXT: (+) BLUE ecchymosis Neuro: Alert and oriented x1  SUMMARY OF RECOMMENDATIONS   DNAR/DNI  MOST Completed, paper copy placed onto the chart electric copy can be found in [employer]  DNR Form Completed, paper copy placed onto the chart electric copy can be found in [employer]  Appreciate TOC arranging home hospice through [facility]  Ongoing PMT support  Billing based on MDM: High Time: 48 ______________________________________________________________________________________ Lamarr Lulas [place] Palliative Medicine Team Team Cell Phone: [phone] Please utilize secure chat with additional questions, if there is no response within 30 minutes please call the above phone number  Palliative Medicine Team providers are available by phone from 7am to 7pm daily and can be reached through the team cell phone.  Should  this patient require assistance outside of these hours, please call the patient's attending physician.

## 2023-03-16 DIAGNOSIS — Z515 Encounter for palliative care: Secondary | ICD-10-CM | POA: Diagnosis not present

## 2023-03-16 DIAGNOSIS — Z7189 Other specified counseling: Secondary | ICD-10-CM | POA: Diagnosis not present

## 2023-03-16 DIAGNOSIS — E162 Hypoglycemia, unspecified: Secondary | ICD-10-CM | POA: Diagnosis not present

## 2023-03-16 DIAGNOSIS — Z95810 Presence of automatic (implantable) cardiac defibrillator: Secondary | ICD-10-CM | POA: Diagnosis not present

## 2023-03-16 LAB — GLUCOSE, CAPILLARY
Glucose-Capillary: 115 mg/dL — ABNORMAL HIGH (ref 70–99)
Glucose-Capillary: 146 mg/dL — ABNORMAL HIGH (ref 70–99)
Glucose-Capillary: 159 mg/dL — ABNORMAL HIGH (ref 70–99)
Glucose-Capillary: 172 mg/dL — ABNORMAL HIGH (ref 70–99)
Glucose-Capillary: 179 mg/dL — ABNORMAL HIGH (ref 70–99)
Glucose-Capillary: 95 mg/dL (ref 70–99)

## 2023-03-16 MED ORDER — TRAZODONE HCL 50 MG PO TABS: 50.0000 mg | ORAL_TABLET | Freq: Every evening | ORAL | Status: DC | PRN

## 2023-03-16 MED ORDER — GUAIFENESIN 100 MG/5ML PO LIQD: 5.0000 mL | ORAL | Status: DC | PRN

## 2023-03-16 MED ORDER — METOPROLOL TARTRATE 5 MG/5ML IV SOLN: 5.0000 mg | INTRAVENOUS | Status: DC | PRN

## 2023-03-16 MED ORDER — LEVOTHYROXINE SODIUM 25 MCG PO TABS: 125.0000 ug | ORAL_TABLET | Freq: Every day | ORAL | Status: DC

## 2023-03-16 MED ORDER — HYDRALAZINE HCL 20 MG/ML IJ SOLN: 10.0000 mg | INTRAMUSCULAR | Status: DC | PRN

## 2023-03-16 MED ORDER — LEVOTHYROXINE SODIUM 75 MCG PO TABS
150.0000 ug | ORAL_TABLET | Freq: Every day | ORAL | Status: DC
  Administered 2023-03-17 – 2023-03-18 (×2): 150 ug via ORAL
  Filled 2023-03-16 (×2): qty 2

## 2023-03-16 NOTE — Plan of Care (Signed)

## 2023-03-16 NOTE — Progress Notes (Signed)
   03/16/23 1435  Mobility  Activity Ambulated with assistance to bathroom  Level of Assistance Moderate assist, patient does 50-74% (+2)  Assistive Device Front wheel walker  Distance Ambulated (ft) 20 ft  Activity Response Tolerated well  Mobility Referral Yes  $Mobility charge 1 Mobility  Mobility Specialist Start Time (ACUTE ONLY) 1328  Mobility Specialist Stop Time (ACUTE ONLY) 1410  Mobility Specialist Time Calculation (min) (ACUTE ONLY) 42 min   Mobility Specialist: Progress Note  Post-Mobility: HR 103, SpO2 98%  Pt agreeable to mobility session - received in bed. Required ModA+2 throughout using RW. No complaints but required heavy verbal cues about hand placement and feet positioning. Returned to chair with all needs met - call bell within reach. Chair alarm on. Wife and daughter present.   Barnie Mort, BS Mobility Specialist Please contact via SecureChat or Rehab office at [phone].

## 2023-03-16 NOTE — Progress Notes (Signed)
Labs: Recent Labs  Lab 03/12/23 1622 03/12/23 1853 03/13/23 0039  PROCALCITON  --   --  <0.10  LATICACIDVEN 1.0 1.3  --     Recent Results (from the past 240 hour(s))  MRSA Next Gen by PCR, Nasal     Status: None   Collection Time: 03/13/23  1:09 AM   Specimen: Nasal Mucosa; Nasal Swab  Result Value Ref Range Status   MRSA by PCR Next Gen NOT DETECTED NOT DETECTED Final    Comment: (NOTE) The GeneXpert MRSA Assay (FDA approved for NASAL specimens only), is one component of a comprehensive MRSA colonization surveillance program. It is not intended to diagnose MRSA infection nor to guide or monitor treatment for MRSA infections. Test performance is not FDA approved in patients less than 87 years old. Performed at [facility], [address], [place], [place] [address]          Radiology Studies: ECHOCARDIOGRAM COMPLETE  Result Date: 03/14/2023    ECHOCARDIOGRAM REPORT   Patient Name:   [name] Date of Exam: 03/14/2023 Medical Rec #:  [id number]       Height:       68.0 in Accession #:    [id number]      Weight:       146.6 lb Date of Birth:  12/26/1933       BSA:          1.791 m Patient Age:    89 years        BP:           88/36 mmHg Patient Gender: M               HR:           96 bpm. Exam Location:  Inpatient Procedure: 2D Echo, Color Doppler and Cardiac  Doppler Indications:    I25.5 Ischemic cardiomyopathy  History:        Patient has prior history of Echocardiogram examinations, most                 recent 03/08/2018. CAD, Defibrillator, PAD and COPD; Risk                 Factors:Hypertension, Diabetes and Dyslipidemia.  Sonographer:    Irving Burton Senior RDCS Referring Phys: [id number] RAVI AGARWALA IMPRESSIONS  1. Left ventricular ejection fraction, by estimation, is 45 to 50%. The left ventricle has mildly decreased function. The left ventricle demonstrates regional wall motion abnormalities (see scoring diagram/findings for description). Left ventricular diastolic parameters are indeterminate.  2. Right ventricular systolic function is mildly reduced. The right ventricular size is normal. Tricuspid regurgitation signal is inadequate for assessing PA pressure.  3. Left atrial size was mildly dilated.  4. The mitral valve is grossly normal. Trivial mitral valve regurgitation. No evidence of mitral stenosis.  5. The aortic valve is abnormal. There is moderate calcification of the aortic valve. Aortic valve regurgitation is trivial. Aortic valve sclerosis/calcification is present, without any evidence of aortic stenosis.  6. The inferior vena cava is normal in size with greater than 50% respiratory variability, suggesting right atrial pressure of 3 mmHg. FINDINGS  Left Ventricle: Left ventricular ejection fraction, by estimation, is 45 to 50%. The left ventricle has mildly decreased function. The left ventricle demonstrates regional wall motion abnormalities. The left ventricular internal cavity size was normal in size. There is no left ventricular hypertrophy. Left ventricular diastolic parameters are indeterminate.  LV Wall Scoring: The mid and  PROGRESS NOTE    [name]  ZOX:[id number] DOB: 12/03/1933 DOA: 03/12/2023 PCP: Etta Grandchild, MD   Brief Narrative:  89 year old with history of a flutter with ICD in place, CAD status post PCI, CHF EF 20% with grade 1 DD, mild dementia, PAD status post iliac stenting, COPD, asthma, DM 2, HLD, HTN, hypothyroidism comes to the ED after lightheadedness and fall.  In ED patient was noted to be hypoglycemic, CT head was negative, chest x-ray showed small right-sided pleural effusion.  Lab work showed AKI.  Cardiology consulted due to suspected issues related to LV pacing.  Patient was transferred to [facility] for [place] [facility].  Patient was admitted to the ICU due to hypotensive episodes. Currently patient is comfortable awaiting for hospice arrangements to be made at home.   Assessment & Plan:  Principal Problem:   Hypoglycemia Active Problems:   NSVT (nonsustained ventricular tachycardia) (HCC)   AICD (automatic cardioverter/defibrillator) present   Acute on chronic combined systolic and diastolic CHF (congestive heart failure) (HCC)   ECG abnormality   Hypotension   Dizziness   Failure to thrive in adult   Advanced care planning/counseling discussion    Hypotension and hypoglycemia can [place]erry to poor oral intake Congestive heart failure with reduced EF, 20% with ICD in place Syncope Patient has now been taken off Levophed.  BP is slowly improving.  Continue midodrine.  No evidence of arrhythmia noted on the ICD.  Seen by cardiology team.  Monitor electrolytes.  Echo shows EF 45% -Hypoglycemia is slowly improving.    AKI on CKD stage IIIb This is resolved.  Initial creatinine 3.1, now back to baseline around 1.5   COPD -Continue bronchodilators.  I-S/flutter.   Hypothyroidism; mildly uncontrolled. TSH ~15 Elevated TSH, normal free T4.  Synthroid increased.  Repeat thyroid studies in about 3-4 weeks   Hyperlipidemia Let [name]   Essential hypertension Now on  midodrine due to low blood pressures   GERD Protonix   Urinary retention likely BPH On Flomax, started during this admission.  Closely monitor due to risk of hypovolemia   Hypovolemic hyponatremia Improved   Severe protein energy malnutrition -Supportive care   Palliative care services following.   DVT prophylaxis: Acute heparin Code Status: DNR Family Communication:   Status is: Inpatient Per family hospice arrangements will be made at home by Thursday morning    Subjective: Patient seen at bedside, no complaints   Examination:  General exam: Appears calm and comfortable, appears chronically ill and weak Respiratory system: Clear to auscultation. Respiratory effort normal. Cardiovascular system: S1 & S2 heard, RRR. No JVD, murmurs, rubs, gallops or clicks. No pedal edema. Gastrointestinal system: Abdomen is nondistended, soft and nontender. No organomegaly or masses felt. Normal bowel sounds heard. Central nervous system: Alert and oriented. No focal neurological deficits. Extremities: Symmetric 5 x 5 power. Skin: No rashes, lesions or ulcers Psychiatry: Judgement and insight appear normal. Mood & affect appropriate.      Diet Orders (From admission, onward)     Start     Ordered   03/15/23 1244  Diet regular Room service appropriate? Yes; Fluid consistency: Thin  Diet effective now       Question Answer Comment  Room service appropriate? Yes   Fluid consistency: Thin      03/15/23 1244            Objective: Vitals:   03/15/23 1400 03/15/23 1431 03/15/23 2032 03/16/23 0457  BP: (!) 113/38 (!) 126/59 (!) 109/54  PROGRESS NOTE    [name]  ZOX:[id number] DOB: 12/03/1933 DOA: 03/12/2023 PCP: Etta Grandchild, MD   Brief Narrative:  89 year old with history of a flutter with ICD in place, CAD status post PCI, CHF EF 20% with grade 1 DD, mild dementia, PAD status post iliac stenting, COPD, asthma, DM 2, HLD, HTN, hypothyroidism comes to the ED after lightheadedness and fall.  In ED patient was noted to be hypoglycemic, CT head was negative, chest x-ray showed small right-sided pleural effusion.  Lab work showed AKI.  Cardiology consulted due to suspected issues related to LV pacing.  Patient was transferred to [facility] for [place] [facility].  Patient was admitted to the ICU due to hypotensive episodes. Currently patient is comfortable awaiting for hospice arrangements to be made at home.   Assessment & Plan:  Principal Problem:   Hypoglycemia Active Problems:   NSVT (nonsustained ventricular tachycardia) (HCC)   AICD (automatic cardioverter/defibrillator) present   Acute on chronic combined systolic and diastolic CHF (congestive heart failure) (HCC)   ECG abnormality   Hypotension   Dizziness   Failure to thrive in adult   Advanced care planning/counseling discussion    Hypotension and hypoglycemia can Derry to poor oral intake Congestive heart failure with reduced EF, 20% with ICD in place Syncope Patient has now been taken off Levophed.  BP is slowly improving.  Continue midodrine.  No evidence of arrhythmia noted on the ICD.  Seen by cardiology team.  Monitor electrolytes.  Echo shows EF 45% -Hypoglycemia is slowly improving.    AKI on CKD stage IIIb This is resolved.  Initial creatinine 3.1, now back to baseline around 1.5   COPD -Continue bronchodilators.  I-S/flutter.   Hypothyroidism; mildly uncontrolled. TSH ~15 Elevated TSH, normal free T4.  Synthroid increased.  Repeat thyroid studies in about 3-4 weeks   Hyperlipidemia Let [name]   Essential hypertension Now on  midodrine due to low blood pressures   GERD Protonix   Urinary retention likely BPH On Flomax, started during this admission.  Closely monitor due to risk of hypovolemia   Hypovolemic hyponatremia Improved   Severe protein energy malnutrition -Supportive care   Palliative care services following.   DVT prophylaxis: Acute heparin Code Status: DNR Family Communication:   Status is: Inpatient Per family hospice arrangements will be made at home by Thursday morning    Subjective: Patient seen at bedside, no complaints   Examination:  General exam: Appears calm and comfortable, appears chronically ill and weak Respiratory system: Clear to auscultation. Respiratory effort normal. Cardiovascular system: S1 & S2 heard, RRR. No JVD, murmurs, rubs, gallops or clicks. No pedal edema. Gastrointestinal system: Abdomen is nondistended, soft and nontender. No organomegaly or masses felt. Normal bowel sounds heard. Central nervous system: Alert and oriented. No focal neurological deficits. Extremities: Symmetric 5 x 5 power. Skin: No rashes, lesions or ulcers Psychiatry: Judgement and insight appear normal. Mood & affect appropriate.      Diet Orders (From admission, onward)     Start     Ordered   03/15/23 1244  Diet regular Room service appropriate? Yes; Fluid consistency: Thin  Diet effective now       Question Answer Comment  Room service appropriate? Yes   Fluid consistency: Thin      03/15/23 1244            Objective: Vitals:   03/15/23 1400 03/15/23 1431 03/15/23 2032 03/16/23 0457  BP: (!) 113/38 (!) 126/59 (!) 109/54  Labs: Recent Labs  Lab 03/12/23 1622 03/12/23 1853 03/13/23 0039  PROCALCITON  --   --  <0.10  LATICACIDVEN 1.0 1.3  --     Recent Results (from the past 240 hour(s))  MRSA Next Gen by PCR, Nasal     Status: None   Collection Time: 03/13/23  1:09 AM   Specimen: Nasal Mucosa; Nasal Swab  Result Value Ref Range Status   MRSA by PCR Next Gen NOT DETECTED NOT DETECTED Final    Comment: (NOTE) The GeneXpert MRSA Assay (FDA approved for NASAL specimens only), is one component of a comprehensive MRSA colonization surveillance program. It is not intended to diagnose MRSA infection nor to guide or monitor treatment for MRSA infections. Test performance is not FDA approved in patients less than 87 years old. Performed at [facility], [address], [place], [place] [address]          Radiology Studies: ECHOCARDIOGRAM COMPLETE  Result Date: 03/14/2023    ECHOCARDIOGRAM REPORT   Patient Name:   [name] Date of Exam: 03/14/2023 Medical Rec #:  [id number]       Height:       68.0 in Accession #:    [id number]      Weight:       146.6 lb Date of Birth:  12/26/1933       BSA:          1.791 m Patient Age:    89 years        BP:           88/36 mmHg Patient Gender: M               HR:           96 bpm. Exam Location:  Inpatient Procedure: 2D Echo, Color Doppler and Cardiac  Doppler Indications:    I25.5 Ischemic cardiomyopathy  History:        Patient has prior history of Echocardiogram examinations, most                 recent 03/08/2018. CAD, Defibrillator, PAD and COPD; Risk                 Factors:Hypertension, Diabetes and Dyslipidemia.  Sonographer:    Irving Burton Senior RDCS Referring Phys: [id number] RAVI AGARWALA IMPRESSIONS  1. Left ventricular ejection fraction, by estimation, is 45 to 50%. The left ventricle has mildly decreased function. The left ventricle demonstrates regional wall motion abnormalities (see scoring diagram/findings for description). Left ventricular diastolic parameters are indeterminate.  2. Right ventricular systolic function is mildly reduced. The right ventricular size is normal. Tricuspid regurgitation signal is inadequate for assessing PA pressure.  3. Left atrial size was mildly dilated.  4. The mitral valve is grossly normal. Trivial mitral valve regurgitation. No evidence of mitral stenosis.  5. The aortic valve is abnormal. There is moderate calcification of the aortic valve. Aortic valve regurgitation is trivial. Aortic valve sclerosis/calcification is present, without any evidence of aortic stenosis.  6. The inferior vena cava is normal in size with greater than 50% respiratory variability, suggesting right atrial pressure of 3 mmHg. FINDINGS  Left Ventricle: Left ventricular ejection fraction, by estimation, is 45 to 50%. The left ventricle has mildly decreased function. The left ventricle demonstrates regional wall motion abnormalities. The left ventricular internal cavity size was normal in size. There is no left ventricular hypertrophy. Left ventricular diastolic parameters are indeterminate.  LV Wall Scoring: The mid and  Labs: Recent Labs  Lab 03/12/23 1622 03/12/23 1853 03/13/23 0039  PROCALCITON  --   --  <0.10  LATICACIDVEN 1.0 1.3  --     Recent Results (from the past 240 hour(s))  MRSA Next Gen by PCR, Nasal     Status: None   Collection Time: 03/13/23  1:09 AM   Specimen: Nasal Mucosa; Nasal Swab  Result Value Ref Range Status   MRSA by PCR Next Gen NOT DETECTED NOT DETECTED Final    Comment: (NOTE) The GeneXpert MRSA Assay (FDA approved for NASAL specimens only), is one component of a comprehensive MRSA colonization surveillance program. It is not intended to diagnose MRSA infection nor to guide or monitor treatment for MRSA infections. Test performance is not FDA approved in patients less than 87 years old. Performed at [facility], [address], [place], [place] [address]          Radiology Studies: ECHOCARDIOGRAM COMPLETE  Result Date: 03/14/2023    ECHOCARDIOGRAM REPORT   Patient Name:   [name] Date of Exam: 03/14/2023 Medical Rec #:  [id number]       Height:       68.0 in Accession #:    [id number]      Weight:       146.6 lb Date of Birth:  12/26/1933       BSA:          1.791 m Patient Age:    89 years        BP:           88/36 mmHg Patient Gender: M               HR:           96 bpm. Exam Location:  Inpatient Procedure: 2D Echo, Color Doppler and Cardiac  Doppler Indications:    I25.5 Ischemic cardiomyopathy  History:        Patient has prior history of Echocardiogram examinations, most                 recent 03/08/2018. CAD, Defibrillator, PAD and COPD; Risk                 Factors:Hypertension, Diabetes and Dyslipidemia.  Sonographer:    Irving Burton Senior RDCS Referring Phys: [id number] RAVI AGARWALA IMPRESSIONS  1. Left ventricular ejection fraction, by estimation, is 45 to 50%. The left ventricle has mildly decreased function. The left ventricle demonstrates regional wall motion abnormalities (see scoring diagram/findings for description). Left ventricular diastolic parameters are indeterminate.  2. Right ventricular systolic function is mildly reduced. The right ventricular size is normal. Tricuspid regurgitation signal is inadequate for assessing PA pressure.  3. Left atrial size was mildly dilated.  4. The mitral valve is grossly normal. Trivial mitral valve regurgitation. No evidence of mitral stenosis.  5. The aortic valve is abnormal. There is moderate calcification of the aortic valve. Aortic valve regurgitation is trivial. Aortic valve sclerosis/calcification is present, without any evidence of aortic stenosis.  6. The inferior vena cava is normal in size with greater than 50% respiratory variability, suggesting right atrial pressure of 3 mmHg. FINDINGS  Left Ventricle: Left ventricular ejection fraction, by estimation, is 45 to 50%. The left ventricle has mildly decreased function. The left ventricle demonstrates regional wall motion abnormalities. The left ventricular internal cavity size was normal in size. There is no left ventricular hypertrophy. Left ventricular diastolic parameters are indeterminate.  LV Wall Scoring: The mid and

## 2023-03-16 NOTE — Progress Notes (Signed)
Rounding Note    Patient Name: [name] Date of Encounter: 03/16/2023  [facility] Cardiologist: [name], MD   Subjective   Confused no chest pain Spoke with daughter yesterday and since patient is DNR / comfort care wanted device Rx's turned off    Inpatient Medications    Scheduled Meds:  atorvastatin  40 mg Oral q AM   Chlorhexidine Gluconate Cloth  6 each Topical Daily   feeding supplement  237 mL Oral BID BM   folic acid  1 mg Oral Daily   heparin injection (subcutaneous)  5,000 Units Subcutaneous Q8H   levothyroxine  150 mcg Oral Q0600   midodrine  10 mg Oral TID WC   mometasone-formoterol  2 puff Inhalation BID   montelukast  10 mg Oral QHS   pantoprazole  40 mg Oral BID   sodium chloride flush  10 mL Intravenous Q12H   tamsulosin  0.4 mg Oral Daily   Continuous Infusions:   PRN Meds: acetaminophen, calcium carbonate, docusate sodium, guaiFENesin, hydrALAZINE, ipratropium-albuterol, metoprolol tartrate, mouth rinse, polyethylene glycol, traZODone   Vital Signs    Vitals:   03/15/23 1400 03/15/23 1431 03/15/23 2032 03/16/23 0457  BP: (!) 113/38 (!) 126/59 (!) 109/54 121/61  Pulse: 80 94 78 90  Resp: (!) 22 17 17 17   Temp:  98.2 F (36.8 C) 97.6 F (36.4 C) 98.1 F (36.7 C)  TempSrc:  Oral Oral Oral  SpO2: 98% 100% 98% 99%  Weight:      Height:        Intake/Output Summary (Last 24 hours) at 03/16/2023 0844 Last data filed at 03/16/2023 0843 Gross per 24 hour  Intake 10 ml  Output 1125 ml  Net -1115 ml      03/15/2023    5:53 AM 03/14/2023    4:45 AM 03/13/2023    1:09 AM  Last 3 Weights  Weight (lbs) 145 lb 8.1 oz 146 lb 9.7 oz 151 lb 10.8 oz  Weight (kg) 66 kg 66.5 kg 68.8 kg      Telemetry    Sinus rhythm, sinus tachycardia with PVCs  - Personally Reviewed  ECG     - Personally Reviewed  Physical Exam   GEN: elderly male,  agitated  Neck: No JVD Cardiac: SEM AICD under left clavicle  Respiratory:  Clear to auscultation bilaterally. GI: Soft, nontender, non-distended  MS:  1-2 + leg edema  Neuro:  Nonfocal  Psych: Normal affect   Labs    High Sensitivity Troponin:   Recent Labs  Lab 03/12/23 1606 03/12/23 1847  TROPONINIHS 17 18*     Chemistry Recent Labs  Lab 03/12/23 1606 03/12/23 1620 03/13/23 0320 03/14/23 1537 03/15/23 0537  NA 134*   < > 130* 135 134*  K 4.2   < > 3.9 4.9 5.0  CL 100   < > 98 102 102  CO2 24   < > 25 23 21*  GLUCOSE 29*   < > 118* 152* 122*  BUN 73*   < > 59* 48* 46*  CREATININE 2.81*   < > 2.21* 1.76* 1.58*  CALCIUM 9.0   < > 8.4* 9.0 9.1  MG 1.8  --  1.6*  --   --   PROT 9.1*  --   --   --   --   ALBUMIN 3.6  --   --   --   --   AST 23  --   --   --   --  ALT 11  --   --   --   --   ALKPHOS 46  --   --   --   --   BILITOT 0.6  --   --   --   --   GFRNONAA 21*   < > 28* 37* 42*  ANIONGAP 10   < > 7 10 11    < > = values in this interval not displayed.    Lipids No results for input(s): "CHOL", "TRIG", "HDL", "LABVLDL", "LDLCALC", "CHOLHDL" in the last 168 hours.  Hematology Recent Labs  Lab 03/12/23 1606 03/12/23 1620 03/13/23 0320 03/14/23 1537  WBC 10.3  --  8.5 6.9  RBC 3.00*  --  2.52* 2.47*  HGB 9.9* 10.2* 8.4* 8.0*  HCT 31.6* 30.0* 25.8* 24.8*  MCV 105.3*  --  102.4* 100.4*  MCH 33.0  --  33.3 32.4  MCHC 31.3  --  32.6 32.3  RDW 21.1*  --  21.2* 21.3*  PLT 269  --  203 184   Thyroid  Recent Labs  Lab 03/13/23 0039 03/13/23 1850  TSH 14.748*  --   FREET4  --  0.66    BNP Recent Labs  Lab 03/14/23 1537  BNP 347.2*    DDimer No results for input(s): "DDIMER" in the last 168 hours.   Radiology    ECHOCARDIOGRAM COMPLETE  Result Date: 03/14/2023    ECHOCARDIOGRAM REPORT   Patient Name:   [name] [facility] Date of Exam: 03/14/2023 Medical Rec #:  [id number]       Height:       68.0 in Accession #:    [id number]      Weight:       146.6 lb Date of Birth:  07-25-1933       BSA:          1.791 m Patient Age:     89 years        BP:           88/36 mmHg Patient Gender: M               HR:           96 bpm. Exam Location:  Inpatient Procedure: 2D Echo, Color Doppler and Cardiac Doppler Indications:    I25.5 Ischemic cardiomyopathy  History:        Patient has prior history of Echocardiogram examinations, most                 recent 03/08/2018. CAD, Defibrillator, PAD and COPD; Risk                 Factors:Hypertension, Diabetes and Dyslipidemia.  Sonographer:    Irving Burton Senior RDCS Referring Phys: [id number] RAVI AGARWALA IMPRESSIONS  1. Left ventricular ejection fraction, by estimation, is 45 to 50%. The left ventricle has mildly decreased function. The left ventricle demonstrates regional wall motion abnormalities (see scoring diagram/findings for description). Left ventricular diastolic parameters are indeterminate.  2. Right ventricular systolic function is mildly reduced. The right ventricular size is normal. Tricuspid regurgitation signal is inadequate for assessing PA pressure.  3. Left atrial size was mildly dilated.  4. The mitral valve is grossly normal. Trivial mitral valve regurgitation. No evidence of mitral stenosis.  5. The aortic valve is abnormal. There is moderate calcification of the aortic valve. Aortic valve regurgitation is trivial. Aortic valve sclerosis/calcification is present, without any evidence of aortic stenosis.  6. The inferior vena cava is normal in size  with greater than 50% respiratory variability, suggesting right atrial pressure of 3 mmHg. FINDINGS  Left Ventricle: Left ventricular ejection fraction, by estimation, is 45 to 50%. The left ventricle has mildly decreased function. The left ventricle demonstrates regional wall motion abnormalities. The left ventricular internal cavity size was normal in size. There is no left ventricular hypertrophy. Left ventricular diastolic parameters are indeterminate.  LV Wall Scoring: The mid and distal lateral wall, inferior wall, and posterior wall are  hypokinetic. Right Ventricle: The right ventricular size is normal. No increase in right ventricular wall thickness. Right ventricular systolic function is mildly reduced. Tricuspid regurgitation signal is inadequate for assessing PA pressure. Left Atrium: Left atrial size was mildly dilated. Right Atrium: Right atrial size was normal in size. Pericardium: There is no evidence of pericardial effusion. Mitral Valve: The mitral valve is grossly normal. Trivial mitral valve regurgitation. No evidence of mitral valve stenosis. Tricuspid Valve: The tricuspid valve is normal in structure. Tricuspid valve regurgitation is trivial. No evidence of tricuspid stenosis. Aortic Valve: The aortic valve is abnormal. There is moderate calcification of the aortic valve. Aortic valve regurgitation is trivial. Aortic valve sclerosis/calcification is present, without any evidence of aortic stenosis. Aortic valve mean gradient measures 7.2 mmHg. Aortic valve peak gradient measures 14.8 mmHg. Aortic valve area, by VTI measures 1.37 cm. Pulmonic Valve: The pulmonic valve was normal in structure. Pulmonic valve regurgitation is not visualized. No evidence of pulmonic stenosis. Aorta: The ascending aorta was not well visualized. Venous: The inferior vena cava is normal in size with greater than 50% respiratory variability, suggesting right atrial pressure of 3 mmHg. IAS/Shunts: The atrial septum is grossly normal.  LEFT VENTRICLE PLAX 2D LVIDd:         5.00 cm LVIDs:         3.90 cm LV PW:         0.90 cm LV IVS:        0.80 cm LVOT diam:     1.80 cm LV SV:         45 LV SV Index:   25 LVOT Area:     2.54 cm  LV Volumes (MOD) LV vol d, MOD A2C: 101.0 ml LV vol d, MOD A4C: 134.0 ml LV vol s, MOD A2C: 57.6 ml LV vol s, MOD A4C: 70.0 ml LV SV MOD A2C:     43.4 ml LV SV MOD A4C:     134.0 ml LV SV MOD BP:      52.1 ml RIGHT VENTRICLE RV S prime:     12.20 cm/s TAPSE (M-mode): 1.5 cm LEFT ATRIUM             Index        RIGHT ATRIUM            Index LA diam:        4.30 cm 2.40 cm/m   RA Area:     10.70 cm LA Vol (A2C):   33.8 ml 18.87 ml/m  RA Volume:   21.20 ml  11.84 ml/m LA Vol (A4C):   65.5 ml 36.57 ml/m LA Biplane Vol: 47.2 ml 26.35 ml/m  AORTIC VALVE AV Area (Vmax):    1.32 cm AV Area (Vmean):   1.46 cm AV Area (VTI):     1.37 cm AV Vmax:           192.51 cm/s AV Vmean:          124.515 cm/s AV VTI:  0.324 m AV Peak Grad:      14.8 mmHg AV Mean Grad:      7.2 mmHg LVOT Vmax:         99.70 cm/s LVOT Vmean:        71.500 cm/s LVOT VTI:          0.175 m LVOT/AV VTI ratio: 0.54  AORTA Ao Root diam: 3.30 cm  SHUNTS Systemic VTI:  0.18 m Systemic Diam: 1.80 cm Weston Brass MD Electronically signed by Weston Brass MD Signature Date/Time: 03/14/2023/2:03:57 PM    Final     Cardiac Studies      Patient Profile     89 y.o. male    Assessment & Plan    1.  Chronic combined systolic and diastolic congestive heart failure: prior EF 25% TTE this admission improved EF 45-50% he was azotemic and dry on admission Diuretic/ARNi held Cr now improved from 3.1 to 1.58.   2.  Hyperlipidemia: Continue atorvastatin.  3. CRT-  [employer] turned off Rx's this am per family wishes   Patient is now DNR due to age and advanced dementia with failure to thrive poor PO intake and fall risk with weight loss   Cardiology will sign off      For questions or updates, please contact [facility] Please consult [email or web address] for contact info under        Signed, Charlton Haws, MD  03/16/2023, 8:44 AM

## 2023-03-16 NOTE — Plan of Care (Signed)
  Problem: Pain Managment: Goal: General experience of comfort will improve 03/16/2023 0126 by Sammuel Cooper, RN Outcome: Progressing 03/16/2023 0054 by Sammuel Cooper, RN Outcome: Progressing   Problem: Safety: Goal: Ability to remain free from injury will improve 03/16/2023 0126 by Sammuel Cooper, RN Outcome: Progressing 03/16/2023 0054 by Sammuel Cooper, RN Outcome: Progressing

## 2023-03-16 NOTE — Progress Notes (Signed)
   Palliative Medicine Inpatient Follow Up Note HPI: 89 yo M w/ pertinent PMH aflutter w/ ICD in place, CAD s/p stent x2, chronic systolic chf (EF 13-24% w/ grade one diastolic dysfx), mild dementia, PAD s/p b/l common iliac stenting 2019 COPD, asthma, t2dm, hld, htn, hypothyroidism. Admitted for fall was found to be hypoglycemic and hypotensive. Palliative care has been asked to get involved to further discuss code status and goals of care.   Today's Discussion 03/16/2023  *Please note that this is a verbal dictation therefore any spelling or grammatical errors are due to the "Dragon Medical One" system interpretation.  Chart reviewed inclusive of vital signs, progress notes, laboratory results, and diagnostic images. VSS stable.   I met with [name] this morning. He is alert and oriented to self. He is not noted to be in any distress and denies pain, shortness of breath, and nausea.   Per patients RN, [name] there are no nursing concerns.   Patients AICD was deactivated this morning per chart review.   Spoke to [name] of the [place] who shares that she has been in close communication with patients daughter. As of their last interaction the goals was for delivery of DME on Thursday though education was provided that patient may be able to discharge as early as tomorrow depending on the primary medical team.   I have called patients daughter, [name] and left a HIPAA compliant VM.   Questions and concerns addressed/Palliative Support Provided.   Objective Assessment: Vital Signs Vitals:   03/15/23 2032 03/16/23 0457  BP: (!) 109/54 121/61  Pulse: 78 90  Resp: 17 17  Temp: 97.6 F (36.4 C) 98.1 F (36.7 C)  SpO2: 98% 99%    Intake/Output Summary (Last 24 hours) at 03/16/2023 0844 Last data filed at 03/16/2023 4010 Gross per 24 hour  Intake 10 ml  Output 1125 ml  Net -1115 ml   Last Weight  Most recent update: 03/15/2023  5:54 AM    Weight  66 kg (145 lb 8.1 oz)             Gen: Elderly Caucasian M chronically ill appearing HEENT: moist mucous membranes CV: Regular rate and irregular rhythm  PULM:  On RA, breathing is even and nonlabored ABD: soft/nontender  EXT: (+) BLUE ecchymosis Neuro: Alert and oriented x1  SUMMARY OF RECOMMENDATIONS   DNAR/DNI  MOST/DNR Form Completed, paper copy placed onto the chart electric copy can be found in Vynca  Appreciate TOC arranging home hospice through [facility]  Ongoing PMT support  Time: 35 ______________________________________________________________________________________ [name] [place] Palliative Medicine Team Team Cell Phone: [phone] Please utilize secure chat with additional questions, if there is no response within 30 minutes please call the above phone number  Palliative Medicine Team providers are available by phone from 7am to 7pm daily and can be reached through the team cell phone.  Should this patient require assistance outside of these hours, please call the patient's attending physician.

## 2023-03-16 NOTE — Care Management Important Message (Signed)
Important Message  Patient Details  Name: [name] MRN: [id number] Date of Birth: 04-09-1934   Important Message Given:  Yes - Medicare IM     Sherilyn Banker 03/16/2023, 2:24 PM

## 2023-03-17 ENCOUNTER — Other Ambulatory Visit (HOSPITAL_COMMUNITY): Payer: Self-pay

## 2023-03-17 DIAGNOSIS — E162 Hypoglycemia, unspecified: Secondary | ICD-10-CM | POA: Diagnosis not present

## 2023-03-17 DIAGNOSIS — Z515 Encounter for palliative care: Secondary | ICD-10-CM | POA: Diagnosis not present

## 2023-03-17 LAB — GLUCOSE, CAPILLARY
Glucose-Capillary: 116 mg/dL — ABNORMAL HIGH (ref 70–99)
Glucose-Capillary: 138 mg/dL — ABNORMAL HIGH (ref 70–99)
Glucose-Capillary: 141 mg/dL — ABNORMAL HIGH (ref 70–99)
Glucose-Capillary: 146 mg/dL — ABNORMAL HIGH (ref 70–99)
Glucose-Capillary: 153 mg/dL — ABNORMAL HIGH (ref 70–99)
Glucose-Capillary: 154 mg/dL — ABNORMAL HIGH (ref 70–99)
Glucose-Capillary: 168 mg/dL — ABNORMAL HIGH (ref 70–99)

## 2023-03-17 LAB — CBC
HCT: 22.1 % — ABNORMAL LOW (ref 39.0–52.0)
Hemoglobin: 7.1 g/dL — ABNORMAL LOW (ref 13.0–17.0)
MCH: 33.2 pg (ref 26.0–34.0)
MCHC: 32.1 g/dL (ref 30.0–36.0)
MCV: 103.3 fL — ABNORMAL HIGH (ref 80.0–100.0)
Platelets: 207 10*3/uL (ref 150–400)
RBC: 2.14 MIL/uL — ABNORMAL LOW (ref 4.22–5.81)
RDW: 22 % — ABNORMAL HIGH (ref 11.5–15.5)
WBC: 6.2 10*3/uL (ref 4.0–10.5)
nRBC: 0 % (ref 0.0–0.2)

## 2023-03-17 LAB — MAGNESIUM: Magnesium: 1.7 mg/dL (ref 1.7–2.4)

## 2023-03-17 LAB — BASIC METABOLIC PANEL
Anion gap: 8 (ref 5–15)
BUN: 32 mg/dL — ABNORMAL HIGH (ref 8–23)
CO2: 25 mmol/L (ref 22–32)
Calcium: 8.5 mg/dL — ABNORMAL LOW (ref 8.9–10.3)
Chloride: 102 mmol/L (ref 98–111)
Creatinine, Ser: 1.39 mg/dL — ABNORMAL HIGH (ref 0.61–1.24)
GFR, Estimated: 49 mL/min — ABNORMAL LOW (ref >60)
Glucose, Bld: 139 mg/dL — ABNORMAL HIGH (ref 70–99)
Potassium: 4.1 mmol/L (ref 3.5–5.1)
Sodium: 135 mmol/L (ref 135–145)

## 2023-03-17 LAB — PHOSPHORUS: Phosphorus: 2 mg/dL — ABNORMAL LOW (ref 2.5–4.6)

## 2023-03-17 MED ORDER — LEVOTHYROXINE SODIUM 150 MCG PO TABS
150.0000 ug | ORAL_TABLET | Freq: Every day | ORAL | 0 refills | Status: DC
Start: 2023-03-18 — End: 2023-04-27
  Filled 2023-03-17: qty 30, 30d supply, fill #0

## 2023-03-17 MED ORDER — TAMSULOSIN HCL 0.4 MG PO CAPS
0.4000 mg | ORAL_CAPSULE | Freq: Every day | ORAL | 0 refills | Status: DC
Start: 2023-03-18 — End: 2023-04-27
  Filled 2023-03-17: qty 30, 30d supply, fill #0

## 2023-03-17 MED ORDER — MIDODRINE HCL 10 MG PO TABS
10.0000 mg | ORAL_TABLET | Freq: Three times a day (TID) | ORAL | 0 refills | Status: DC
Start: 2023-03-17 — End: 2023-04-27
  Filled 2023-03-17: qty 90, 30d supply, fill #0

## 2023-03-17 NOTE — Progress Notes (Signed)
Pt is staying for 1 more day per discussion of pt and family and doctor. Foley care education was provided to family. Pt needs more education tomorrow prior to DC.

## 2023-03-17 NOTE — Progress Notes (Addendum)
PROGRESS NOTE    [name]  DGL:[id number] DOB: 02/04/1934 DOA: 03/12/2023 PCP: Etta Grandchild, MD   Brief Narrative:  89 year old with history of a flutter with ICD in place, CAD status post PCI, CHF EF 20% with grade 1 DD, mild dementia, PAD status post iliac stenting, COPD, asthma, DM 2, HLD, HTN, hypothyroidism comes to the ED after lightheadedness and fall.  In ED patient was noted to be hypoglycemic, CT head was negative, chest x-ray showed small right-sided pleural effusion.  Lab work showed AKI.  Cardiology consulted due to suspected issues related to LV pacing.  Patient was transferred to [facility] for [place] [facility].  Patient was admitted to the ICU due to hypotensive episodes. Currently patient is comfortable awaiting for hospice arrangements to be made at home- -plan for d/c Thursday.     Assessment & Plan:  Principal Problem:   Hypoglycemia Active Problems:   NSVT (nonsustained ventricular tachycardia) (HCC)   AICD (automatic cardioverter/defibrillator) present   Acute on chronic combined systolic and diastolic CHF (congestive heart failure) (HCC)   ECG abnormality   Hypotension   Dizziness   Failure to thrive in adult   Advanced care planning/counseling discussion    Hypotension and hypoglycemia due to poor oral intake Congestive heart failure with reduced EF, 20% with ICD in place Syncope Patient has now been taken off Levophed.  BP is slowly improving.  Continue midodrine.  No evidence of arrhythmia noted on the ICD.  Seen by cardiology team.  Monitor electrolytes.  Echo shows EF 45%    AKI on CKD stage IIIb This is resolved.  Initial creatinine 3.1, now back to baseline around 1.5   COPD -Continue bronchodilators.  I-S/flutter.   Hypothyroidism; mildly uncontrolled. TSH ~15 Elevated TSH, normal free T4.  Synthroid increased.  Repeat thyroid studies in about 3-4 weeks     Anemia of CD -going home with hospice, can follow outpatient  Essential  hypertension Now on midodrine due to low blood pressures   GERD Protonix   Urinary retention likely BPH On Flomax, started during this admission -foley placed in hospital-- plan to d.c with foley since EOL care   Hypovolemic hyponatremia Improved   Severe protein energy malnutrition -Supportive care   Palliative care services following.   Code Status: DNR Family Communication:   Status is: Inpatient Per family hospice arrangements will be made at home by Thursday morning    Subjective: No overnight events   Examination:   General: Appearance:    Well developed, well nourished male in no acute distress     Lungs:     respirations unlabored  Heart:    Normal heart rate.  MS:   All extremities are intact.   Neurologic:   Awake, alert         Diet Orders (From admission, onward)     Start     Ordered   03/15/23 1244  Diet regular Room service appropriate? Yes; Fluid consistency: Thin  Diet effective now       Question Answer Comment  Room service appropriate? Yes   Fluid consistency: Thin      03/15/23 1244            Objective: Vitals:   03/17/23 0445 03/17/23 0803 03/17/23 0857 03/17/23 0929  BP:   (!) 121/52 (!) 125/50  Pulse:  87 90 88  Resp:  16 18 18   Temp:   98 F (36.7 C) 98.8 F (37.1 C)  TempSrc:  Oral Oral  SpO2:  99% 90% 100%  Weight: 65.8 kg     Height:        Intake/Output Summary (Last 24 hours) at 03/17/2023 1003 Last data filed at 03/16/2023 1300 Gross per 24 hour  Intake 600 ml  Output --  Net 600 ml   Filed Weights   03/14/23 0445 03/15/23 0553 03/17/23 0445  Weight: 66.5 kg 66 kg 65.8 kg    Scheduled Meds:  atorvastatin  40 mg Oral q AM   Chlorhexidine Gluconate Cloth  6 each Topical Daily   feeding supplement  237 mL Oral BID BM   folic acid  1 mg Oral Daily   heparin injection (subcutaneous)  5,000 Units Subcutaneous Q8H   levothyroxine  150 mcg Oral Q0600   midodrine  10 mg Oral TID WC    mometasone-formoterol  2 puff Inhalation BID   montelukast  10 mg Oral QHS   pantoprazole  40 mg Oral BID   sodium chloride flush  10 mL Intravenous Q12H   tamsulosin  0.4 mg Oral Daily   Continuous Infusions:  Nutritional status     Body mass index is 22.06 kg/m.  Data Reviewed:   CBC: Recent Labs  Lab 03/12/23 1606 03/12/23 1620 03/13/23 0320 03/14/23 1537 03/17/23 0709  WBC 10.3  --  8.5 6.9 6.2  NEUTROABS 8.1*  --   --  4.5  --   HGB 9.9* 10.2* 8.4* 8.0* 7.1*  HCT 31.6* 30.0* 25.8* 24.8* 22.1*  MCV 105.3*  --  102.4* 100.4* 103.3*  PLT 269  --  203 184 207   Basic Metabolic Panel: Recent Labs  Lab 03/12/23 1606 03/12/23 1620 03/13/23 0039 03/13/23 0320 03/14/23 1537 03/15/23 0537 03/17/23 0709  NA 134*   < > 132* 130* 135 134* 135  K 4.2   < > 4.4 3.9 4.9 5.0 4.1  CL 100   < > 98 98 102 102 102  CO2 24  --  23 25 23  21* 25  GLUCOSE 29*   < > 76 118* 152* 122* 139*  BUN 73*   < > 61* 59* 48* 46* 32*  CREATININE 2.81*   < > 2.34* 2.21* 1.76* 1.58* 1.39*  CALCIUM 9.0  --  8.5* 8.4* 9.0 9.1 8.5*  MG 1.8  --   --  1.6*  --   --  1.7  PHOS  --   --   --   --   --   --  2.0*   < > = values in this interval not displayed.   GFR: Estimated Creatinine Clearance: 34.8 mL/min (A) (by C-G formula based on SCr of 1.39 mg/dL (H)). Liver Function Tests: Recent Labs  Lab 03/12/23 1606  AST 23  ALT 11  ALKPHOS 46  BILITOT 0.6  PROT 9.1*  ALBUMIN 3.6   No results for input(s): "LIPASE", "AMYLASE" in the last 168 hours. No results for input(s): "AMMONIA" in the last 168 hours. Coagulation Profile: Recent Labs  Lab 03/12/23 1606  INR 1.3*   Cardiac Enzymes: No results for input(s): "CKTOTAL", "CKMB", "CKMBINDEX", "TROPONINI" in the last 168 hours. BNP (last 3 results) No results for input(s): "PROBNP" in the last 8760 hours. HbA1C: No results for input(s): "HGBA1C" in the last 72 hours. CBG: Recent Labs  Lab 03/16/23 1652 03/16/23 2047 03/17/23 0058  03/17/23 0446 03/17/23 0907  GLUCAP 159* 172* 153* 141* 116*   Lipid Profile: No results for input(s): "CHOL", "HDL", "LDLCALC", "TRIG", "CHOLHDL", "  LDLDIRECT" in the last 72 hours. Thyroid Function Tests: No results for input(s): "TSH", "T4TOTAL", "FREET4", "T3FREE", "THYROIDAB" in the last 72 hours.  Anemia Panel: No results for input(s): "VITAMINB12", "FOLATE", "FERRITIN", "TIBC", "IRON", "RETICCTPCT" in the last 72 hours. Sepsis Labs: Recent Labs  Lab 03/12/23 1622 03/12/23 1853 03/13/23 0039  PROCALCITON  --   --  <0.10  LATICACIDVEN 1.0 1.3  --     Recent Results (from the past 240 hour(s))  MRSA Next Gen by PCR, Nasal     Status: None   Collection Time: 03/13/23  1:09 AM   Specimen: Nasal Mucosa; Nasal Swab  Result Value Ref Range Status   MRSA by PCR Next Gen NOT DETECTED NOT DETECTED Final    Comment: (NOTE) The GeneXpert MRSA Assay (FDA approved for NASAL specimens only), is one component of a comprehensive MRSA colonization surveillance program. It is not intended to diagnose MRSA infection nor to guide or monitor treatment for MRSA infections. Test performance is not FDA approved in patients less than 78 years old. Performed at [facility], [address] [place], [place] [address]          Radiology Studies: No results found.         LOS: 5 days   Time spent= 35 mins    Joseph Art, DO [facility]  If 7PM-7AM, please contact night-coverage  03/17/2023, 10:03 AM

## 2023-03-17 NOTE — Progress Notes (Signed)
Received call from patient's daughter stating she doesn't feel they will be able to safely get patient into the home. Daughter is requesting PTAR services.

## 2023-03-17 NOTE — Progress Notes (Signed)
Pt has DC order. Pt will be DC home with hospice on board, after hospital bed is delivered at home. IV and telemetry were both removed. Pt will be DC with foley per MD. Supplies for wound dressing and foley care were provided.

## 2023-03-17 NOTE — Progress Notes (Signed)
Family now saying they can not take patient home today.  Will plan for d/c in the AM. JV

## 2023-03-17 NOTE — Progress Notes (Signed)
   Palliative Medicine Inpatient Follow Up Note HPI: 89 yo M w/ pertinent PMH aflutter w/ ICD in place, CAD s/p stent x2, chronic systolic chf (EF 16-10% w/ grade one diastolic dysfx), mild dementia, PAD s/p b/l common iliac stenting 2019 COPD, asthma, t2dm, hld, htn, hypothyroidism. Admitted for fall was found to be hypoglycemic and hypotensive. Palliative care has been asked to get involved to further discuss code status and goals of care.   Today's Discussion 03/17/2023  *Please note that this is a verbal dictation therefore any spelling or grammatical errors are due to the "Dragon Medical One" system interpretation.  Chart reviewed inclusive of vital signs, progress notes, laboratory results, and diagnostic images. VSS stable.   I reached out to patients RN, [name] who expresses no concerns this morning.   I met with [name] this morning. He is alert and oriented to self. He shares that he feels hungry. His breakfast was on the side of him though still warm. I was able to assist him with setting this up.   Per communication with hospice RN, [name] plan for bed delivery this afternoon and then family to drive patient home thereafter. As it sits now the goals will be for discharge later in the day.  Questions and concerns addressed/Palliative Support Provided.   Objective Assessment: Vital Signs Vitals:   03/17/23 0857 03/17/23 0929  BP: (!) 121/52 (!) 125/50  Pulse: 90 88  Resp: 18 18  Temp: 98 F (36.7 C) 98.8 F (37.1 C)  SpO2: 90% 100%    Intake/Output Summary (Last 24 hours) at 03/17/2023 1053 Last data filed at 03/16/2023 1300 Gross per 24 hour  Intake 600 ml  Output --  Net 600 ml   Last Weight  Most recent update: 03/17/2023  5:43 AM    Weight  65.8 kg (145 lb 1 oz)            Gen: Elderly Caucasian M chronically ill appearing HEENT: moist mucous membranes CV: Regular rate and irregular rhythm  PULM:  On RA, breathing is even and nonlabored ABD: soft/nontender   EXT: (+) BLUE ecchymosis Neuro: Alert and oriented x1  SUMMARY OF RECOMMENDATIONS   DNAR/DNI  MOST/DNR Form Completed, paper copy placed onto the chart electric copy can be found in Vynca  Plan to discharge home with hospice through [facility] this evening  Time: 35 ______________________________________________________________________________________ [name] [place] Palliative Medicine Team Team Cell Phone: [phone] Please utilize secure chat with additional questions, if there is no response within 30 minutes please call the above phone number  Palliative Medicine Team providers are available by phone from 7am to 7pm daily and can be reached through the team cell phone.  Should this patient require assistance outside of these hours, please call the patient's attending physician.

## 2023-03-17 NOTE — TOC Progression Note (Signed)
Transition of Care (TOC) - Progression Note   Patient wife and daughter at bedside. States they have received hospital bed but do not have sheets. Daughter states she is not ready to take her dad home today, but will be ready tomorrow morning. MD changed discharge to tomorrow. Daughter will transport patient home at discharge. She does not want ambulance transportation  Patient Details  Name: [name] MRN: [id number] Date of Birth: Apr 01, 1934  Transition of Care [facility]) CM/SW Contact  Breklyn Fabrizio, Adria Devon, RN Phone Number: 03/17/2023, 3:23 PM  Clinical Narrative:       Expected Discharge Plan: Home w Hospice Care Barriers to Discharge: Continued Medical Work up  Expected Discharge Plan and Services   Discharge Planning Services: CM Consult Post Acute Care Choice: Hospice Living arrangements for the past 2 months: Single Family Home Expected Discharge Date: 03/18/23                         HH Arranged: RN HH Agency: [facility] Date HH Agency Contacted: 03/15/23 Time HH Agency Contacted: [phone] Representative spoke with at [facility] Agency: Norm Parcel   Social Determinants of Health (SDOH) Interventions SDOH Screenings   Food Insecurity: No Food Insecurity (06/15/2022)  Housing: Low Risk  (06/11/2022)  Transportation Needs: No Transportation Needs (06/15/2022)  Utilities: Not At Risk (06/11/2022)  Depression (PHQ2-9): Medium Risk (06/11/2022)  Social Connections: Unknown (09/28/2021)   Received from [facility]  Tobacco Use: Medium Risk (03/12/2023)    Readmission Risk Interventions    06/12/2022    3:15 PM  Readmission Risk Prevention Plan  Transportation Screening Complete  PCP or Specialist Appt within 5-7 Days Complete  Home Care Screening Complete  Medication Review (RN CM) Complete

## 2023-03-17 NOTE — Discharge Summary (Signed)
Physician Discharge Summary  [name] WUJ:[id number] DOB: 24-Nov-1933 DOA: 03/12/2023  PCP: Etta Grandchild, MD  Admit date: 03/12/2023 Discharge date: 03/17/2023  Admitted From: home Discharge disposition: home with hospice   Recommendations for Outpatient Follow-Up:   Hospice at home Can follow TSH outpatient   Discharge Diagnosis:   Principal Problem:   Hypoglycemia Active Problems:   NSVT (nonsustained ventricular tachycardia) (HCC)   AICD (automatic cardioverter/defibrillator) present   Acute on chronic combined systolic and diastolic CHF (congestive heart failure) (HCC)   ECG abnormality   Hypotension   Dizziness   Failure to thrive in adult   Advanced care planning/counseling discussion    Discharge Condition: Improved.  Diet recommendation:  Regular.  Wound care: None.  Code status: Full.   History of Present Illness:   Patient is a 89 yo M w/ pertinent PMH aflutter w/ ICD in place, CAD s/p stent x2, chronic systolic chf (EF 95-62% w/ grade one diastolic dysfx), mild dementia, PAD s/p b/l common iliac stenting 2019 COPD, asthma, t2dm, hld, htn, hypothyroidism present to [facility] ED on 10/11 after lightheadedness/fall.   Patient last seen by cardiology on June 2024. BP borderline low but kept on metoprolol. Per family patient has poor appetite. Having several episodes of lightheadedness when he gets up. On 10/11 patient became dizzy when getting up from chair and fell on his right elbow. Denies any LOC. Patient daughter heard fall and ems transported to [facility] ED. On arrival BP 125/60 and afebrile. Given IV fluids. CBG 29 given dextrose and started on d10 drip. CT head no acute abnormality. CXR w/ small right pleural effusion. Elbow xray negative for acute fracture and gauze dressing placed over skin tear. Creat 2.81 (baseline 1.8). EKG w/ paced rhythm w/ RBBB. Cardiology consulted suspect related to LV pacing. Believe patient's dizziness is related to  dehydration and orthostatic hypotension. Recommend transfer to [facility]. Patient remained hypoglycemic despite being on d10 drip and bp becoming more hypotensive w/ maps in 60s. PCCM consulted for icu admission.    According to the patient's family he has had issues with orthostatic hypotension for some time.  They feel that his memory issues have been worsening.  Lack of interest in eating has also increased.   Hospital Course by Problem:   Hypotension and hypoglycemia due to poor oral intake Congestive heart failure with reduced EF, 20% with ICD in place Syncope Patient has now been taken off Levophed.  BP is slowly improving.  Continue midodrine.  No evidence of arrhythmia noted on the ICD.  Seen by cardiology team.  Monitor electrolytes.  Echo shows EF 45%     AKI on CKD stage IIIb This is resolved.  Initial creatinine 3.1, now back to baseline around 1.5   COPD -Continue bronchodilators.  I-S/flutter.   Hypothyroidism; mildly uncontrolled. TSH ~15 Elevated TSH, normal free T4.  Synthroid increased.  Repeat thyroid studies in about 3-4 weeks     Anemia of CD -going home with hospice   Essential hypertension Now on midodrine due to low blood pressures   GERD Protonix   Urinary retention likely BPH On Flomax, started during this admission -foley placed in hospital-- plan to d.c with foley since EOL care   Hypovolemic hyponatremia Improved   Severe protein energy malnutrition -Supportive care       Medical Consultants:   Palliative care cards   Discharge Exam:   Vitals:   03/17/23 0857 03/17/23 0929  BP: (!) 121/52 Marland Kitchen)  125/50  Pulse: 90 88  Resp: 18 18  Temp: 98 F (36.7 C) 98.8 F (37.1 C)  SpO2: 90% 100%   Vitals:   03/17/23 0445 03/17/23 0803 03/17/23 0857 03/17/23 0929  BP:   (!) 121/52 (!) 125/50  Pulse:  87 90 88  Resp:  16 18 18   Temp:   98 F (36.7 C) 98.8 F (37.1 C)  TempSrc:   Oral Oral  SpO2:  99% 90% 100%  Weight: 65.8 kg     Height:           The results of significant diagnostics from this hospitalization (including imaging, microbiology, ancillary and laboratory) are listed below for reference.     Procedures and Diagnostic Studies:   US Abdomen Limited RUQ (LIVER/GB)  Result Date: 03/14/2023 CLINICAL DATA:  Cirrhosis. EXAM: ULTRASOUND ABDOMEN LIMITED RIGHT UPPER QUADRANT COMPARISON:  January 20, 2005 FINDINGS: Gallbladder: The gallbladder is not visualized. Common bile duct: Diameter: 3.7 mm Liver: No focal lesion identified. Diffusely increased echogenicity of the liver parenchyma is noted. Portal vein is patent on color Doppler imaging with normal direction of blood flow towards the liver. Other: A small right pleural effusion is suspected. IMPRESSION: 1. Nonvisualization of the gallbladder. Correlation with the patient's surgical history is recommended to exclude prior cholecystectomy. 2. Hepatic steatosis without focal liver lesions. 3. Small right pleural effusion. Electronically Signed   By: Aram Candela M.D.   On: 03/14/2023 00:18   CT Head Wo Contrast  Result Date: 03/12/2023 CLINICAL DATA:  Found lying on back by daughter. Patient states he fell. Denies loss of consciousness. Right arm pain. Low blood pressure. EXAM: CT HEAD WITHOUT CONTRAST TECHNIQUE: Contiguous axial images were obtained from the base of the skull through the vertex without intravenous contrast. RADIATION DOSE REDUCTION: This exam was performed according to the departmental dose-optimization program which includes automated exposure control, adjustment of the mA and/or kV according to patient size and/or use of iterative reconstruction technique. COMPARISON:  CT head 05/09/2015 FINDINGS: Brain: No intracranial hemorrhage, mass effect, or evidence of acute infarct. No hydrocephalus. No extra-axial fluid collection. Age-commensurate cerebral atrophy and chronic small vessel ischemic disease. Vascular: No hyperdense vessel. Intracranial arterial  calcification. Skull: No fracture or focal lesion. Sinuses/Orbits: Questionable proptosis. Enlargement of the bilateral inferior and medial rectus muscles. Other: None. IMPRESSION: 1. No acute intracranial abnormality. 2. 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 Electronically Signed   By: Minerva Fester M.D.   On: 03/12/2023 21:39   DG Humerus Right  Result Date: 03/12/2023 CLINICAL DATA:  Fall.  Dizziness.  Hypotension. EXAM: RIGHT HUMERUS - 2+ VIEW COMPARISON:  Elbow radiographs from 12/09/2018 FINDINGS: No humeral fracture or malalignment at the glenohumeral joint. Incidental IV tubing in the left antecubital region. IMPRESSION: 1. No humeral fracture or malalignment at the glenohumeral joint. Electronically Signed   By: Gaylyn Rong M.D.   On: 03/12/2023 18:37   DG Chest Portable 1 View  Result Date: 03/12/2023 CLINICAL DATA:  Fall, dizziness.  Hypotension. EXAM: PORTABLE CHEST 1 VIEW COMPARISON:  Chest x-ray November 23, 2020. FINDINGS: Small right pleural effusion. No confluent consolidation. Central pulmonary vascular congestion. Similar cardiomediastinal silhouette. Patient rotation. Left subclavian approach cardiac rhythm maintenance device. No acute bony abnormality. IMPRESSION: Small right pleural effusion and central pulmonary vascular congestion. Electronically Signed   By: Feliberto Harts M.D.   On: 03/12/2023 18:33     Labs:   Basic Metabolic Panel: Recent Labs  Lab 03/12/23 1606 03/12/23 1620 03/13/23 0039 03/13/23 0320  03/14/23 1537 03/15/23 0537 03/17/23 0709  NA 134*   < > 132* 130* 135 134* 135  K 4.2   < > 4.4 3.9 4.9 5.0 4.1  CL 100   < > 98 98 102 102 102  CO2 24  --  23 25 23  21* 25  GLUCOSE 29*   < > 76 118* 152* 122* 139*  BUN 73*   < > 61* 59* 48* 46* 32*  CREATININE 2.81*   < > 2.34* 2.21* 1.76* 1.58* 1.39*  CALCIUM 9.0  --  8.5* 8.4* 9.0 9.1 8.5*  MG 1.8  --   --  1.6*  --   --  1.7  PHOS  --   --   --   --   --   --  2.0*   < > = values in  this interval not displayed.   GFR Estimated Creatinine Clearance: 34.8 mL/min (A) (by C-G formula based on SCr of 1.39 mg/dL (H)). Liver Function Tests: Recent Labs  Lab 03/12/23 1606  AST 23  ALT 11  ALKPHOS 46  BILITOT 0.6  PROT 9.1*  ALBUMIN 3.6   No results for input(s): "LIPASE", "AMYLASE" in the last 168 hours. No results for input(s): "AMMONIA" in the last 168 hours. Coagulation profile Recent Labs  Lab 03/12/23 1606  INR 1.3*    CBC: Recent Labs  Lab 03/12/23 1606 03/12/23 1620 03/13/23 0320 03/14/23 1537 03/17/23 0709  WBC 10.3  --  8.5 6.9 6.2  NEUTROABS 8.1*  --   --  4.5  --   HGB 9.9* 10.2* 8.4* 8.0* 7.1*  HCT 31.6* 30.0* 25.8* 24.8* 22.1*  MCV 105.3*  --  102.4* 100.4* 103.3*  PLT 269  --  203 184 207   Cardiac Enzymes: No results for input(s): "CKTOTAL", "CKMB", "CKMBINDEX", "TROPONINI" in the last 168 hours. BNP: Invalid input(s): "POCBNP" CBG: Recent Labs  Lab 03/16/23 1652 03/16/23 2047 03/17/23 0058 03/17/23 0446 03/17/23 0907  GLUCAP 159* 172* 153* 141* 116*   D-Dimer No results for input(s): "DDIMER" in the last 72 hours. Hgb A1c No results for input(s): "HGBA1C" in the last 72 hours. Lipid Profile No results for input(s): "CHOL", "HDL", "LDLCALC", "TRIG", "CHOLHDL", "LDLDIRECT" in the last 72 hours. Thyroid function studies No results for input(s): "TSH", "T4TOTAL", "T3FREE", "THYROIDAB" in the last 72 hours.  Invalid input(s): "FREET3" Anemia work up No results for input(s): "VITAMINB12", "FOLATE", "FERRITIN", "TIBC", "IRON", "RETICCTPCT" in the last 72 hours. Microbiology Recent Results (from the past 240 hour(s))  MRSA Next Gen by PCR, Nasal     Status: None   Collection Time: 03/13/23  1:09 AM   Specimen: Nasal Mucosa; Nasal Swab  Result Value Ref Range Status   MRSA by PCR Next Gen NOT DETECTED NOT DETECTED Final    Comment: (NOTE) The GeneXpert MRSA Assay (FDA approved for NASAL specimens only), is one component of  a comprehensive MRSA colonization surveillance program. It is not intended to diagnose MRSA infection nor to guide or monitor treatment for MRSA infections. Test performance is not FDA approved in patients less than 57 years old. Performed at [facility], [address] [place], [place] [address]      Discharge Instructions:   Discharge Instructions     Diet general   Complete by: As directed    Discharge wound care:   Complete by: As directed    Wound care  Every other day    Cleanse right arm wounds with  saline, pat dry. Cover each with single layer of xeroform gauze, top with dry dressing or foam if patient's skin can tolerate. Change every other day   Increase activity slowly   Complete by: As directed       Allergies as of 03/17/2023       Reactions   Tape Other (See Comments)   SKIN IS VERY DELICATE AND WILL TEAR EASILY!!!!   Prednisone Rash   Sulfa Antibiotics Other (See Comments)   "Hyper"        Medication List     STOP taking these medications    aspirin EC 81 MG tablet   clopidogrel 75 MG tablet Commonly known as: PLAVIX   furosemide 20 MG tablet Commonly known as: LASIX   metoprolol succinate 50 MG 24 hr tablet Commonly known as: TOPROL-XL       TAKE these medications    atorvastatin 40 MG tablet Commonly known as: LIPITOR TAKE 1 TABLET (40 MG TOTAL) BY MOUTH IN THE MORNING   docusate sodium 100 MG capsule Commonly known as: COLACE Take 1 capsule (100 mg total) by mouth 2 (two) times daily as needed for mild constipation.   famotidine 40 MG tablet Commonly known as: PEPCID Take 40 mg by mouth in the morning.   folic acid 1 MG tablet Commonly known as: FOLVITE TAKE 1 TABLET BY MOUTH EVERY DAY What changed: when to take this   levothyroxine 150 MCG tablet Commonly known as: SYNTHROID Take 1 tablet (150 mcg total) by mouth daily at 6 (six) AM. Start taking on: March 18, 2023 What changed:  when to take this Another  medication with the same name was removed. Continue taking this medication, and follow the directions you see here.   midodrine 10 MG tablet Commonly known as: PROAMATINE Take 1 tablet (10 mg total) by mouth 3 (three) times daily with meals.   mirtazapine 30 MG tablet Commonly known as: REMERON TAKE 1 TABLET BY MOUTH EVERY DAY What changed: when to take this   montelukast 10 MG tablet Commonly known as: SINGULAIR TAKE 1 TABLET BY MOUTH EVERY DAY What changed: when to take this   multivitamin with minerals Tabs tablet Take 1 tablet by mouth daily.   pantoprazole 40 MG tablet Commonly known as: PROTONIX TAKE 1 TABLET (40 MG TOTAL) BY MOUTH IN THE MORNING AND AT BEDTIME What changed: See the new instructions.   Refresh Optive PF 0.5-0.9 % Soln Generic drug: Carboxymethylcell-Glycerin PF Place 1 drop into both eyes 3 (three) times daily as needed (for dryness).   tamsulosin 0.4 MG Caps capsule Commonly known as: FLOMAX Take 1 capsule (0.4 mg total) by mouth daily. Start taking on: March 18, 2023   Tums E-X 750 750 MG chewable tablet Generic drug: calcium carbonate Chew 1 tablet by mouth 4 (four) times daily - after meals and at bedtime.   TYLENOL 500 MG tablet Generic drug: acetaminophen Take 500 mg by mouth every 6 (six) hours as needed for mild pain or headache.   Vaseline Petrolatum Gauze Apply topically daily. What changed:  how much to take when to take this additional instructions   Wixela Inhub 250-50 MCG/ACT Aepb Generic drug: fluticasone-salmeterol Inhale 1 puff into the lungs in the morning and at bedtime.   zinc gluconate 50 MG tablet Take 1 tablet (50 mg total) by mouth daily.               Discharge Care Instructions  (From admission, onward)  Start     Ordered   03/17/23 0000  Discharge wound care:       Comments: Wound care  Every other day    Cleanse right arm wounds with saline, pat dry. Cover each with single layer of  xeroform gauze, top with dry dressing or foam if patient's skin can tolerate. Change every other day   03/17/23 1101            Follow-up Information     [place], [facility] Follow up.   Specialty: Home Health Services Why: Hospice RN will call to arrange initial visit Contact information: [address] [place] [address] [phone]                  Time coordinating discharge: 45 min  Signed:  Joseph Art DO  [facility] 03/17/2023, 11:01 AM

## 2023-03-17 NOTE — Plan of Care (Signed)

## 2023-03-18 ENCOUNTER — Other Ambulatory Visit (HOSPITAL_COMMUNITY): Payer: Self-pay

## 2023-03-18 LAB — GLUCOSE, CAPILLARY
Glucose-Capillary: 149 mg/dL — ABNORMAL HIGH (ref 70–99)
Glucose-Capillary: 123 mg/dL — ABNORMAL HIGH (ref 70–99)
Glucose-Capillary: 139 mg/dL — ABNORMAL HIGH (ref 70–99)

## 2023-03-18 NOTE — Discharge Summary (Signed)
Please see d/c summary done 10/16-- no changes-- family no ready to take home JV

## 2023-03-18 NOTE — TOC Progression Note (Signed)
Transition of Care [facility]) - Progression Note    Patient Details  Name: [name] MRN: [id number] Date of Birth: 1933/09/26  Transition of Care [facility]) CM/SW Contact  Nadene Rubins Adria Devon, RN Phone Number: 03/18/2023, 11:16 AM  Clinical Narrative:     Patient's daughter and wife at bedside. Family wants patient transported home by ambulance. Confirmed address with daughter. Nurse ready for [facility] to be called. Called [facility] they will pick patient up between 1130 and 1230. Nurse and Cheri with [facility] aware   [facility] paperwork and DNR in paper chart   Expected Discharge Plan: Home w Hospice Care Barriers to Discharge: Continued Medical Work up  Expected Discharge Plan and Services   Discharge Planning Services: CM Consult Post Acute Care Choice: Hospice Living arrangements for the past 2 months: Single Family Home Expected Discharge Date: 03/18/23                         HH Arranged: RN HH Agency: [facility] Date HH Agency Contacted: 03/15/23 Time HH Agency Contacted: [phone] Representative spoke with at [facility] Agency: Norm Parcel   Social Determinants of Health (SDOH) Interventions SDOH Screenings   Food Insecurity: No Food Insecurity (06/15/2022)  Housing: Low Risk  (06/11/2022)  Transportation Needs: No Transportation Needs (06/15/2022)  Utilities: Not At Risk (06/11/2022)  Depression (PHQ2-9): Medium Risk (06/11/2022)  Social Connections: Unknown (09/28/2021)   Received from [facility]  Tobacco Use: Medium Risk (03/12/2023)    Readmission Risk Interventions    06/12/2022    3:15 PM  Readmission Risk Prevention Plan  Transportation Screening Complete  PCP or Specialist Appt within 5-7 Days Complete  Home Care Screening Complete  Medication Review (RN CM) Complete

## 2023-03-18 NOTE — Plan of Care (Signed)
  Problem: Education: Goal: Ability to describe self-care measures that may prevent or decrease complications (Diabetes Survival Skills Education) will improve Outcome: Adequate for Discharge Goal: Individualized Educational Video(s) Outcome: Adequate for Discharge   Problem: Coping: Goal: Ability to adjust to condition or change in health will improve Outcome: Adequate for Discharge   Problem: Fluid Volume: Goal: Ability to maintain a balanced intake and output will improve Outcome: Adequate for Discharge   Problem: Health Behavior/Discharge Planning: Goal: Ability to identify and utilize available resources and services will improve Outcome: Adequate for Discharge Goal: Ability to manage health-related needs will improve Outcome: Adequate for Discharge   Problem: Metabolic: Goal: Ability to maintain appropriate glucose levels will improve Outcome: Adequate for Discharge   Problem: Nutritional: Goal: Maintenance of adequate nutrition will improve Outcome: Adequate for Discharge Goal: Progress toward achieving an optimal weight will improve Outcome: Adequate for Discharge   Problem: Skin Integrity: Goal: Risk for impaired skin integrity will decrease Outcome: Adequate for Discharge   Problem: Tissue Perfusion: Goal: Adequacy of tissue perfusion will improve Outcome: Adequate for Discharge   Problem: Education: Goal: Knowledge of General Education information will improve Description: Including pain rating scale, medication(s)/side effects and non-pharmacologic comfort measures Outcome: Adequate for Discharge   Problem: Health Behavior/Discharge Planning: Goal: Ability to manage health-related needs will improve Outcome: Adequate for Discharge   Problem: Clinical Measurements: Goal: Ability to maintain clinical measurements within normal limits will improve Outcome: Adequate for Discharge Goal: Will remain free from infection Outcome: Adequate for Discharge Goal:  Diagnostic test results will improve Outcome: Adequate for Discharge Goal: Respiratory complications will improve Outcome: Adequate for Discharge Goal: Cardiovascular complication will be avoided Outcome: Adequate for Discharge   Problem: Activity: Goal: Risk for activity intolerance will decrease Outcome: Adequate for Discharge   Problem: Nutrition: Goal: Adequate nutrition will be maintained Outcome: Adequate for Discharge   Problem: Coping: Goal: Level of anxiety will decrease Outcome: Adequate for Discharge   Problem: Elimination: Goal: Will not experience complications related to bowel motility Outcome: Adequate for Discharge Goal: Will not experience complications related to urinary retention Outcome: Adequate for Discharge   Problem: Pain Managment: Goal: General experience of comfort will improve Outcome: Adequate for Discharge   Problem: Safety: Goal: Ability to remain free from injury will improve Outcome: Adequate for Discharge   Problem: Skin Integrity: Goal: Risk for impaired skin integrity will decrease Outcome: Adequate for Discharge   Problem: Education: Goal: Understanding of post-operative needs will improve Outcome: Adequate for Discharge Goal: Individualized Educational Video(s) Outcome: Adequate for Discharge   Problem: Clinical Measurements: Goal: Postoperative complications will be avoided or minimized Outcome: Adequate for Discharge   Problem: Respiratory: Goal: Will regain and/or maintain adequate ventilation Outcome: Adequate for Discharge

## 2023-03-19 ENCOUNTER — Other Ambulatory Visit (HOSPITAL_COMMUNITY): Payer: Self-pay

## 2023-03-23 ENCOUNTER — Other Ambulatory Visit (HOSPITAL_COMMUNITY): Payer: Self-pay

## 2023-04-05 DIAGNOSIS — Z7401 Bed confinement status: Secondary | ICD-10-CM | POA: Diagnosis not present

## 2023-04-05 DIAGNOSIS — R279 Unspecified lack of coordination: Secondary | ICD-10-CM | POA: Diagnosis not present

## 2023-04-05 DIAGNOSIS — R5381 Other malaise: Secondary | ICD-10-CM | POA: Diagnosis not present

## 2023-04-08 ENCOUNTER — Ambulatory Visit: Payer: Medicare Other | Admitting: Podiatry

## 2023-05-02 DEATH — deceased

## 2023-06-24 ENCOUNTER — Ambulatory Visit: Payer: Medicare Other | Admitting: Internal Medicine

## 2023-07-26 ENCOUNTER — Encounter: Payer: Medicare Other | Admitting: Internal Medicine

## 2023-09-30 IMAGING — CT CT ANGIO AOBIFEM WO/W CM
2 of 7 series · 12 of 46 positions shown, 14 images · IV contrast (APPLIED)
Comparison: 12/09/2006

CLINICAL DATA: 87-year-old male with PA D and ulcer

EXAM:
CT ANGIOGRAPHY OF ABDOMINAL AORTA WITH ILIOFEMORAL RUNOFF
TECHNIQUE: Multidetector CT imaging of the abdomen, pelvis and lower
extremities was performed using the standard protocol during bolus
administration of intravenous contrast. Multiplanar CT image
reconstructions and MIPs were obtained to evaluate the vascular
anatomy.

[Series 5: angiorunoff 3.0 i30f 3 · axial · 0.98mm/px · z∈[+26,+1325]mm · 11 of 480 slices shown, 13 images]
[im 31/480  soft-tissue]
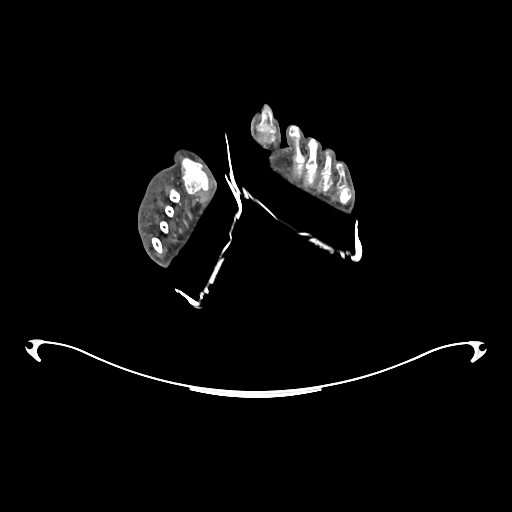
[im 31/480  bone]
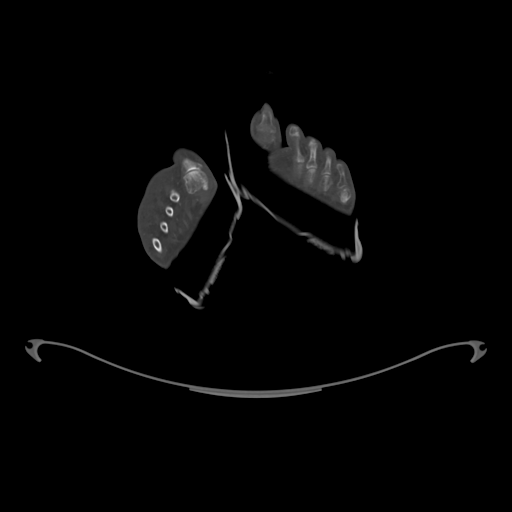
[im 93/480  soft-tissue]
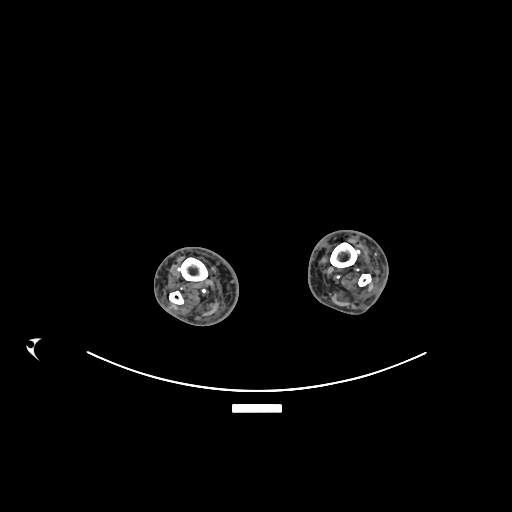
[im 155/480  soft-tissue]
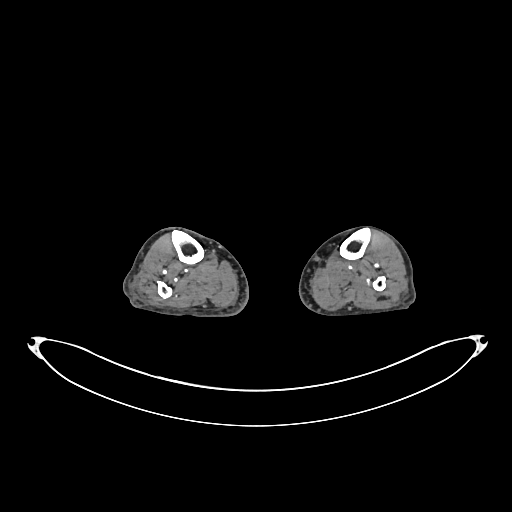
[im 217/480  soft-tissue]
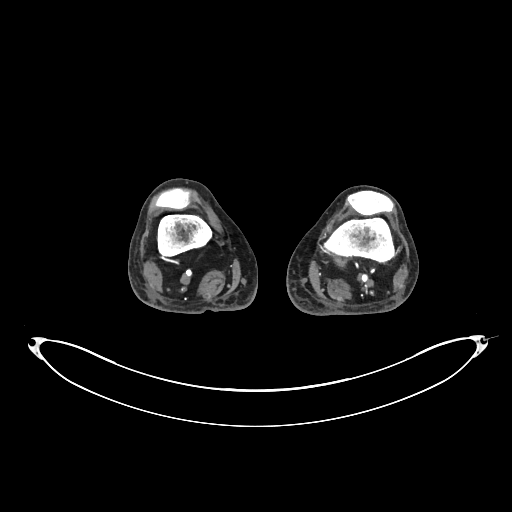
[im 263/480  soft-tissue]
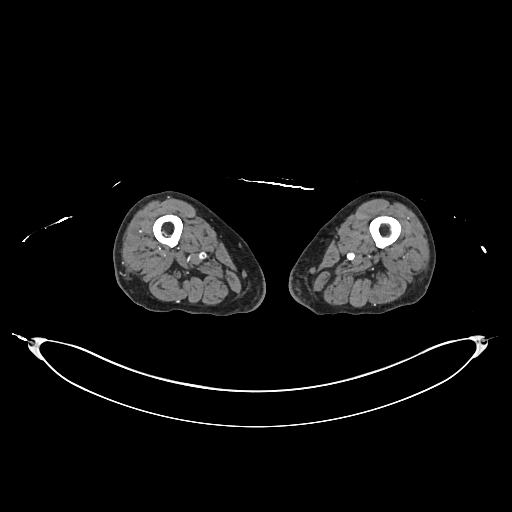
[im 325/480  soft-tissue]
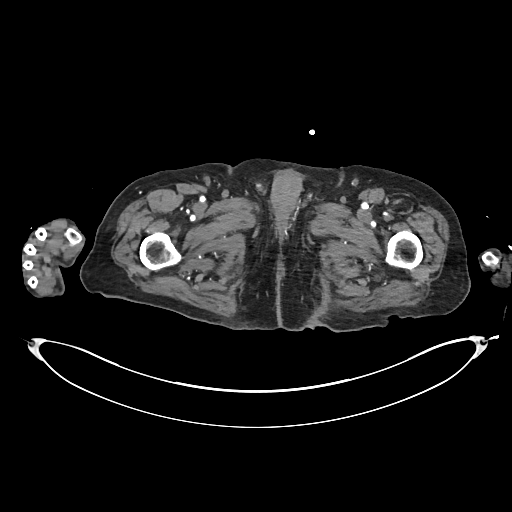
[im 387/480  soft-tissue]
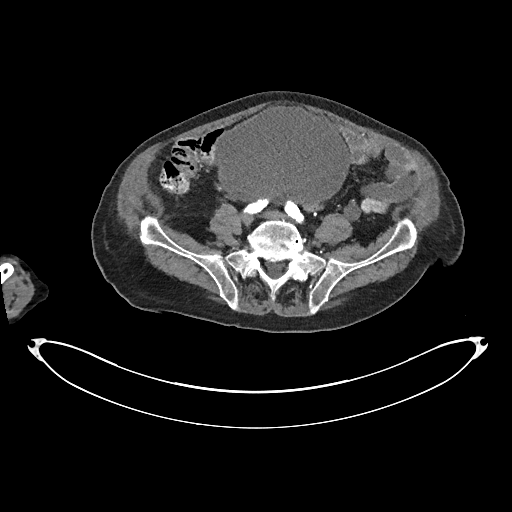
[im 418/480  lung]
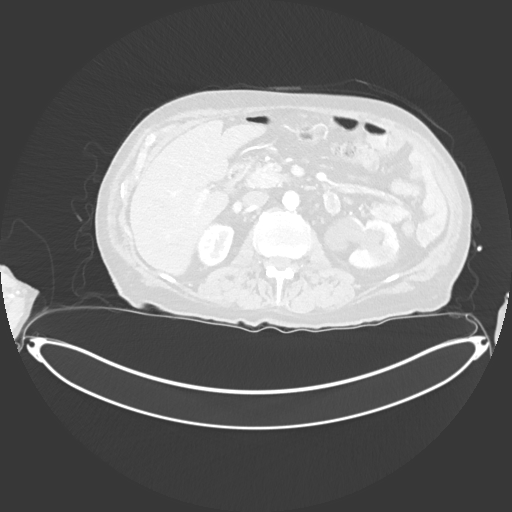
[im 433/480  lung]
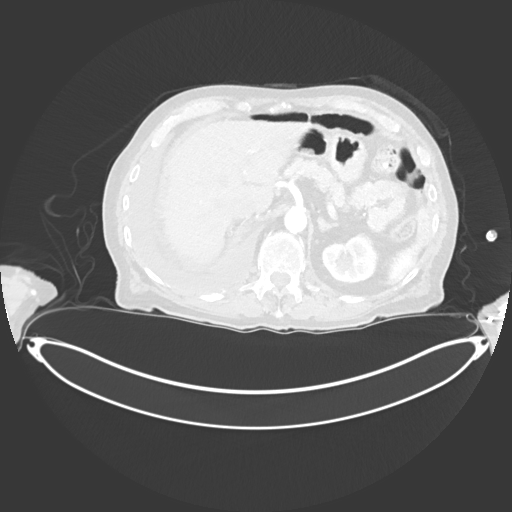
[im 449/480  soft-tissue]
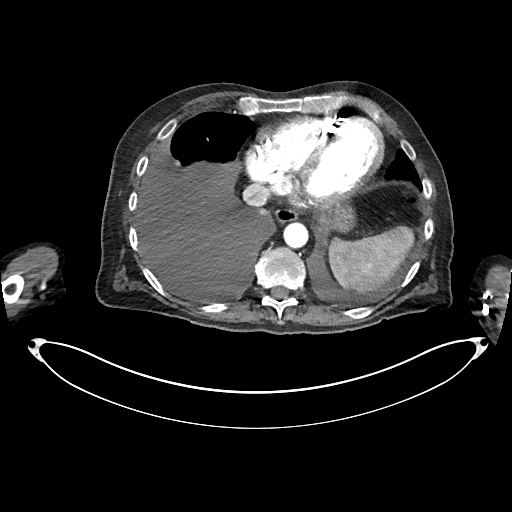
[im 449/480  lung]
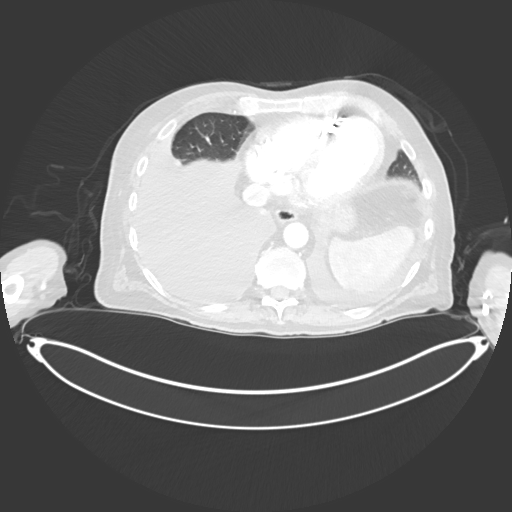
[im 464/480  lung]
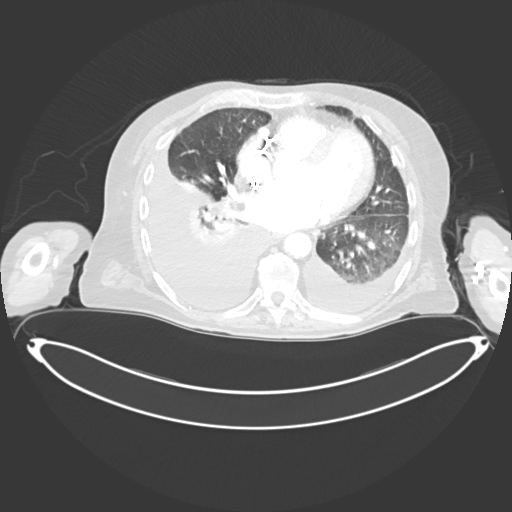

[Series 7: coronal upper · coronal · 0.97mm/px · 1 of 150 slices shown]
[im 75/150  soft-tissue]
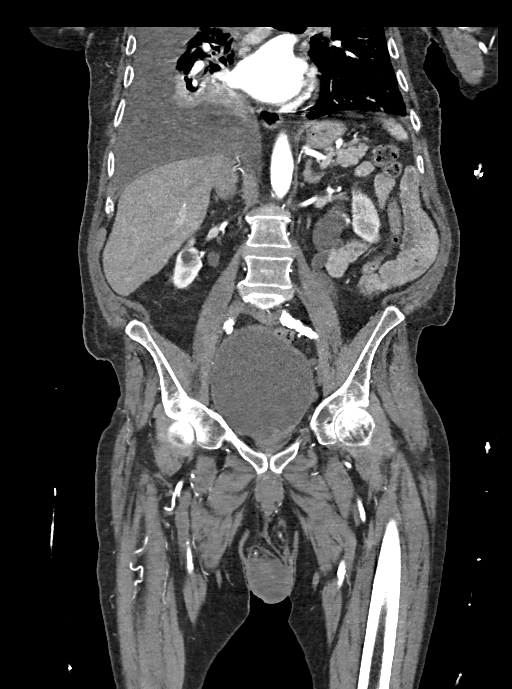

[12 of 46 positions shown; findings below may reference images not displayed]

RADIATION DOSE REDUCTION: This exam was performed according to the
departmental dose-optimization program which includes automated
exposure control, adjustment of the mA and/or kV according to
patient size and/or use of iterative reconstruction technique.

CONTRAST:  100mL OMNIPAQUE IOHEXOL 350 MG/ML SOLN
FINDINGS: VASCULAR

Aorta: Mild atherosclerosis of the lower thoracic aorta.

Unremarkable diameter at the hiatus. No aneurysm of the abdominal
aorta. No dissection, ulcerated plaque. No wall thickening or
periaortic fluid.

Celiac: Mild atherosclerotic changes at the celiac artery origin
without evidence of high-grade stenosis or occlusion. Branches are
patent.

SMA: No significant atherosclerotic changes at the SMA origin.
Branches are patent.

Renals:

- Right: Single right renal artery. Mild atherosclerosis without
evidence of high-grade stenosis or occlusion

- Left: Single left renal artery. Mild atherosclerosis without
high-grade stenosis or occlusion

IMA: Inferior mesenteric artery is patent. Rim calcified soft tissue
focus along the course of the IMA is favored to represent the
remnants of a remote torsed appendage. This finding was present
within the peritoneum on the remote comparison CT, within the right
abdomen and now migrated.

Right lower extremity:

Patent common iliac artery stent. Moderate atherosclerotic changes
of the common iliac artery and external iliac artery without
evidence of high-grade stenosis or occlusion. Hypogastric artery is
patent with mild atherosclerosis.

Calcified Vyce Tefre plaque of the right common femoral artery with
at least 50% narrowing.

Atherosclerotic calcifications extend into the proximal SFA and the
profunda femoris.

Profunda femoris and the thigh branches are patent.

Advanced atherosclerosis of the right SFA, including disease
proximally and calcified disease within the adductor canal
contributing to chronic total occlusion in the adductor canal.

Reconstitution of distal SFA and popliteal artery via collateral
flow.

Trifurcation is patent.

Circumferential calcifications of the tibial arteries somewhat
limits evaluation. There appears to be a short segment occlusion in
the anterior tibial artery. Posterior tibial artery and peroneal
artery appear patent from the origin to the ankle.

Left lower extremity:

Patent left common iliac stent. Moderate atherosclerosis of the
distal common iliac artery and external iliac artery without
high-grade stenosis or occlusion. Hypogastric artery is patent with
moderate atherosclerosis.

Common femoral artery patent with mild atherosclerosis.

Profunda femoris and thigh branches patent.

SFA demonstrates moderate atherosclerotic changes including mixed
calcified and soft plaque in the adductor canal contributing likely
to a high-grade stenosis.

Popliteal artery patent.

Trifurcation is patent.

Circumferential calcifications somewhat limited evaluation of the
tibial arteries. The anterior tibial artery, peroneal artery, and
posterior tibial artery appear patent from the origin to the ankle.

The lateral plantar artery appears occluded

Veins: Unremarkable appearance of the venous system.

Review of the MIP images confirms the above findings.

NON-VASCULAR

Lower chest: Right greater than left pleural effusion. Associated
atelectasis. Cardiac pacer is partially imaged.

Hepatobiliary: Unremarkable appearance of the liver. Cholecystectomy

Pancreas: Unremarkable.

Spleen: Unremarkable.

Adrenals/Urinary Tract:

- Right adrenal gland: Unremarkable

- Left adrenal gland: Unremarkable.

- Right kidney: No hydronephrosis, nephrolithiasis, inflammation, or
ureteral dilation. Small low-density cortical lesion in the lower
pole of the right kidney, too small to characterize.

- Left Kidney: Hydronephrosis and hydroureter, new from the
comparison CT. No nephrolithiasis. No inflammatory changes. Bosniak
1 cyst in the lower left renal cortex measures 2.8 cm. There is a
smaller nonspecific cystic structure measuring 1 cm on the lateral
cortex which has grown 3 mm since the prior of 0884 and is most
likely benign. The left ureter may have ectopic insertion, above the
level of the right insertion based on the CT images.

- Urinary Bladder: Urinary bladder significantly distended.

Stomach/Bowel:

- Stomach: Unremarkable.

- Small bowel: Unremarkable

- Appendix: Appendix is not visualized, however, no inflammatory
changes are present adjacent to the cecum to indicate an
appendicitis.

- Colon: Mild colonic diverticular disease. No inflammatory changes.

Lymphatic: No adenopathy.

Mesenteric: No free fluid or air. No mesenteric adenopathy.

Reproductive: Prostatomegaly with a transverse dimension of the
prostate 3.8 cm.

Other: No hernia.

Musculoskeletal: No displaced fracture. Degenerative changes of the
visualized spine. No aggressive appearing sclerotic or lucent
lesions.
IMPRESSION: Multilevel PA D, including:

-aortic atherosclerosis Aortic Atherosclerosis ([id number].L).

-bilateral iliac arterial disease, with patent bilateral CIA stent
system

-advanced right femoropopliteal disease, including CFA stenosis
secondary to Serxhino Mu plaque extending into the SFA and profunda
femoris origin, chronic CTO of the mid/distal SFA, and
reconstitution of the popliteal artery

-right tibial arterial disease with calcifications limiting the
evaluation, however, apparent right AT occlusion distally

-moderate left femoropopliteal disease, with likely high-grade
stenosis of the SFA within the adductor canal secondary to mixed
calcified and soft plaque and patency of the popliteal artery

-left tibial arterial disease, with calcifications somewhat limiting
the evaluation though apparent patency of all 3 tibial arteries to
the ankle.

Mild mesenteric and renal artery disease

Significant distention of urinary bladder.

Left-sided hydronephrosis and ureteral distension, potentially
related to the urinary bladder distension. Questionable ectopic left
ureterovesical junction. Referral to Urology may be useful.

Right greater than left pleural effusion and associated atelectasis.

Additional ancillary findings as above.
